# Patient Record
Sex: Male | Born: 1950 | Race: Black or African American | Hispanic: No | Smoking: Former smoker
Health system: Southern US, Community
[De-identification: ages and names within clinical notes are randomized; demographics above are authoritative.]

## PROBLEM LIST (undated history)

## (undated) DIAGNOSIS — K219 Gastro-esophageal reflux disease without esophagitis: Secondary | ICD-10-CM

## (undated) DIAGNOSIS — F419 Anxiety disorder, unspecified: Secondary | ICD-10-CM

## (undated) DIAGNOSIS — M1990 Unspecified osteoarthritis, unspecified site: Secondary | ICD-10-CM

## (undated) DIAGNOSIS — E119 Type 2 diabetes mellitus without complications: Secondary | ICD-10-CM

## (undated) DIAGNOSIS — I10 Essential (primary) hypertension: Secondary | ICD-10-CM

## (undated) DIAGNOSIS — G4730 Sleep apnea, unspecified: Secondary | ICD-10-CM

## (undated) DIAGNOSIS — E7800 Pure hypercholesterolemia, unspecified: Secondary | ICD-10-CM

## (undated) HISTORY — PX: EYE SURGERY: SHX253

## (undated) HISTORY — PX: PILONIDAL CYST / SINUS EXCISION: SUR543

## (undated) HISTORY — PX: OTHER SURGICAL HISTORY: SHX169

## (undated) HISTORY — PX: ROTATOR CUFF REPAIR: SHX139

---

## 2003-04-17 ENCOUNTER — Emergency Department (HOSPITAL_COMMUNITY): Admission: EM | Admit: 2003-04-17 | Discharge: 2003-04-17 | Payer: Self-pay | Admitting: Emergency Medicine

## 2003-04-20 ENCOUNTER — Emergency Department (HOSPITAL_COMMUNITY): Admission: EM | Admit: 2003-04-20 | Discharge: 2003-04-21 | Payer: Self-pay | Admitting: Emergency Medicine

## 2004-12-04 ENCOUNTER — Inpatient Hospital Stay (HOSPITAL_COMMUNITY): Admission: EM | Admit: 2004-12-04 | Discharge: 2004-12-07 | Payer: Self-pay | Admitting: Emergency Medicine

## 2006-05-28 ENCOUNTER — Ambulatory Visit: Payer: Self-pay | Admitting: Family Medicine

## 2006-06-04 ENCOUNTER — Ambulatory Visit: Payer: Self-pay | Admitting: Family Medicine

## 2006-06-18 ENCOUNTER — Ambulatory Visit: Payer: Self-pay | Admitting: Family Medicine

## 2006-07-18 ENCOUNTER — Ambulatory Visit: Payer: Self-pay | Admitting: Family Medicine

## 2006-08-22 ENCOUNTER — Ambulatory Visit: Payer: Self-pay | Admitting: Family Medicine

## 2006-09-19 ENCOUNTER — Ambulatory Visit: Payer: Self-pay | Admitting: Family Medicine

## 2006-09-26 ENCOUNTER — Ambulatory Visit: Payer: Self-pay | Admitting: Family Medicine

## 2006-10-03 ENCOUNTER — Ambulatory Visit: Payer: Self-pay | Admitting: Family Medicine

## 2006-10-24 ENCOUNTER — Ambulatory Visit: Payer: Self-pay | Admitting: Family Medicine

## 2006-11-24 ENCOUNTER — Ambulatory Visit: Payer: Self-pay | Admitting: Family Medicine

## 2006-12-24 ENCOUNTER — Ambulatory Visit: Payer: Self-pay | Admitting: Family Medicine

## 2007-01-26 ENCOUNTER — Ambulatory Visit: Payer: Self-pay | Admitting: Family Medicine

## 2007-08-11 ENCOUNTER — Ambulatory Visit (HOSPITAL_COMMUNITY): Admission: RE | Admit: 2007-08-11 | Discharge: 2007-08-11 | Payer: Self-pay | Admitting: Family Medicine

## 2008-05-03 ENCOUNTER — Emergency Department (HOSPITAL_COMMUNITY): Admission: EM | Admit: 2008-05-03 | Discharge: 2008-05-03 | Payer: Self-pay | Admitting: Emergency Medicine

## 2008-10-02 ENCOUNTER — Emergency Department (HOSPITAL_COMMUNITY): Admission: EM | Admit: 2008-10-02 | Discharge: 2008-10-02 | Payer: Self-pay | Admitting: Emergency Medicine

## 2010-01-01 ENCOUNTER — Emergency Department (HOSPITAL_COMMUNITY): Admission: EM | Admit: 2010-01-01 | Discharge: 2010-01-01 | Payer: Self-pay | Admitting: Emergency Medicine

## 2010-10-22 LAB — URINALYSIS, ROUTINE W REFLEX MICROSCOPIC
Specific Gravity, Urine: 1.03 (ref 1.005–1.030)
pH: 5.5 (ref 5.0–8.0)

## 2010-10-22 LAB — RAPID URINE DRUG SCREEN, HOSP PERFORMED
Amphetamines: NOT DETECTED
Barbiturates: NOT DETECTED
Benzodiazepines: NOT DETECTED
Cocaine: POSITIVE — AB
Opiates: NOT DETECTED
Tetrahydrocannabinol: NOT DETECTED

## 2010-10-22 LAB — GLUCOSE, CAPILLARY: Glucose-Capillary: 263 mg/dL — ABNORMAL HIGH (ref 70–99)

## 2010-11-05 DIAGNOSIS — Z01810 Encounter for preprocedural cardiovascular examination: Secondary | ICD-10-CM

## 2010-11-05 DIAGNOSIS — I2510 Atherosclerotic heart disease of native coronary artery without angina pectoris: Secondary | ICD-10-CM

## 2010-11-05 DIAGNOSIS — I4949 Other premature depolarization: Secondary | ICD-10-CM

## 2010-11-20 LAB — DIFFERENTIAL
Basophils Relative: 1 % (ref 0–1)
Eosinophils Relative: 1 % (ref 0–5)
Lymphs Abs: 1.3 10*3/uL (ref 0.7–4.0)
Monocytes Relative: 8 % (ref 3–12)
Neutrophils Relative %: 61 % (ref 43–77)

## 2010-11-20 LAB — CBC
HCT: 44.2 % (ref 39.0–52.0)
MCV: 86.1 fL (ref 78.0–100.0)
RBC: 5.13 MIL/uL (ref 4.22–5.81)
WBC: 4.4 10*3/uL (ref 4.0–10.5)

## 2010-11-20 LAB — BASIC METABOLIC PANEL
GFR calc Af Amer: >60 mL/min (ref >60)
GFR calc non Af Amer: >60 mL/min (ref >60)
Glucose, Bld: 267 mg/dL — ABNORMAL HIGH (ref 70–99)
Sodium: 132 mEq/L — ABNORMAL LOW (ref 135–145)

## 2010-12-21 NOTE — H&P (Signed)
[name], [name]                  ACCOUNT NO.:  [id number]   MEDICAL RECORD NO.:  [id number]          PATIENT TYPE:  INP   LOCATION:  A325                          FACILITY:  [facility]   PHYSICIAN:  Osvaldo Shipper, MD     DATE OF BIRTH:  Jun 14, 1951   DATE OF ADMISSION:  12/04/2004  DATE OF DISCHARGE:  LH                                HISTORY & PHYSICAL   The patient does not have a primary medical doctor.   ADMITTING DIAGNOSES:  1.  Gluteal abscess.  2.  Uncontrolled diabetes.  3.  Hypertension.   CHIEF COMPLAINT:  Pain in the left buttock for about 10-12 days.   HISTORY OF PRESENT ILLNESS:  The patient is a 60 year old African American  male with a past medical history of diabetes, hypertension, but who has not  been taking his medications for more than six months and who has not been  seen by a physician for more than a year and a half, presented to the ED  with complaints of pain in his left buttock region as well as drainage.  The  patient mentioned that he was fine till about 10-12 days ago when he started  noticing a boil in his left buttock more towards the midline.  The patient  also experienced sharp pain with this boil.  He tried to take Tylenol with  no relief.  The pain slowly increased and the boil also increased in size  progressively and this past Friday finally the boil burst and started  draining yellowish material with blood.  The patient said that the bloody  drainage pretty much remained continuous and would not stop in spite of him  trying to apply pressure to the wound with the help of paper towels.  The  patient also tried soaking the area in water with Epsom salt but this also  did not help.  The pain also got worse and became about a 9 out of 10 in  intensity and was described as a sharp kind of pain.  The pain did not  radiate anywhere.  The Tylenol that he was taking was not alleviating his  pain in any way, any movement and any pressure on that site  increased the  pain.   The patient otherwise denied any problems with moving his bowels.  No  history of any blood in the stool.  No problems like dysuria or hematuria.  The patient does not give any history of fever at home or any chills at  home.   MEDICATIONS:  The patient has not seen a physician in more than a year and a  half to two years.  He says he was on medications for diabetes and  hypertension but has not been taking them for about six months as he has not  been able to afford it.  The patient was on the following, he is unable to  give me any dosages:  Glucovance,  Lotrel,  alprazolam,  Zoloft,  baby aspirin.   ALLERGIES:  The patient is not allergic to any medication.  PAST MEDICAL HISTORY:  1.  The patient has had diabetes for about six years.  2.  High blood pressure for about 25 years.  3.  He also has a history of depression and anxiety.  The patient does not have a history of coronary artery disease, any lung  disease, or any previous strokes.   PAST SURGICAL HISTORY:  1.  Some surgical procedure to his neck and his head, done at [facility].  He is unable to give me any further details.  2.  He has had two surgeries to his right shoulder for rotator cuff injury.   SOCIAL HISTORY:  The patient lives alone at [place].  He is on disability.  He does not work at this time.  He smokes about half pack per day and has  been smoking for 34 years.  He has occasional beer and wine but not on a  daily basis.  His last consumption was about five days ago.  The last time  he used cocaine was about a year ago, nothing since then.  He denies any IV  drug use.  He walks about 2 miles almost daily and has very good functional  capacity.   FAMILY HISTORY:  Father had diabetes.  Mother also had diabetes.  Otherwise  no other medical problems that he knows of.  No history of any heart disease  or any cancers in the family.   REVIEW OF SYSTEMS:  A  ten-point review of systems was done which was  positive just for occasional constipation, otherwise negative as mentioned  under the HPI.   PHYSICAL EXAMINATION:  VITAL SIGNS:  Temperature 98.7, blood pressure  156/83, heart rate 85, respiratory rate 16, pulse oximetry 98% on room air.  GENERAL:  This is an obese, pleasant, African American male in no apparent  distress.  HEENT:  No pallor, no icterus.  Oral mucous membranes are moist and no oral  lesions are seen.  NECK:  Soft and supple.  LUNGS:  Clear to auscultation bilaterally with no wheezing or rhonchi and  with good air entry.  CARDIOVASCULAR:  S1 S2 is normal regular.  No S3 S4 is heard.  No murmurs  are heard.  No JVD seen.  ABDOMEN:  Soft, nontender, nondistended.  Abdomen is obese.  Bowel sounds  are present and appear to be normal.  No organomegaly or masses appreciated.  EXTREMITIES:  Without edema and peripheral pulses are palpable.  Left  buttock area:  There is an approximately 5-cm x 5-cm indurated area seen in  the left buttock close to the rectum.  There is an opening in the center of  this indurated area which is not actively draining at this time, however, a  bloody secretion with yellowish discharge is seen.  No real fluctuation is  appreciated.  The lesion is slightly firm to palpation and is moderately  tender.  NEUROLOGIC:  Grossly intact.   LABORATORY DATA:  His white count was 5.1 with a normal differential,  hemoglobin 14.3, platelet count 302.  Sodium 136, potassium 3.6, chloride 103, bicarb 26, glucose elevated at 313,  BUN 7, creatinine 0.8.  Total bilirubin is 0.5, alkaline phosphatase 89.  His AST is mildly elevated  at 43 so is the ALT at 44.  Albumin is 3.0.  Calcium is 9.3.  Lipase is 19.   EKG shows sinus rhythm, normal rate and axis. No q waves or ST-T wave  changes noted.  Some degree of LVH is noted.   No imaging studies have been done.   IMPRESSION: 1.  This is a 60 year old African  American male with a past medical history      of diabetes and hypertension, however, has not been compliant with his      medications because of financial problems for more than six months, and      he presents to the emergency department with a gluteal/perirectal      abscess.  No obvious evidence of any kind of injury to this region which      could have precipitated this problem, however, the patient is a diabetic      and is poorly controlled which could have predisposed him to getting      this infection.  2.  The patient's blood sugar is elevated and considering the fact that he      has not been taking his medications, we will assume that his diabetes is      very poorly controlled.  3.  The patient's blood pressure is also slightly elevated at this time.  4.  Patient also has mildly elevated LFT's. Could be secondary to NASH.   PLAN:  1.  Uncontrolled diabetes.  We will give the patient IV fluids.  Insulin has      been given by the ED physician.  His last finger stick was 257.  I will      start the patient on a diabetic diet, put him on finger sticks.  I will      also start the patient on Metformin and glyburide.  The patient's blood      sugars should improve with this regimen.  I will also check an HBA1c to      document poor diabetic control.  2.  Hypertension.  I will start the patient on Lisinopril which will also be      renal protective in a diabetic.  3.  Gluteal/perirectal abscess.  Dr. Malvin Johns has already seen this patient      in the ER.  The patient has been started on levofloxacin and Flagyl.      Plans for surgical intervention as per Dr. Malvin Johns.  Pain control will      be achieved with Dilaudid and Tylenol as needed.  4.  Anxiety and depression.  I will start the patient on alprazolam p.r.n.      as well as on Zoloft as he was taking as an outpatient.  5.  Mildly Elevated LFT: Will recheck LFT in AM and will check hepatitis      profile.   Further  management decisions will be based on the patient's response to  initial treatment and results of further testing.      GK/MEDQ  D:  12/04/2004  T:  12/04/2004  Job:  [id number]   cc:   Barbaraann Barthel, M.D.  [address]  [place]  [place] [address]  Fax: [phone]

## 2010-12-21 NOTE — Consult Note (Signed)
NAMEARSH, FEUTZ NO.:  [id number]   MEDICAL RECORD NO.:  [id number]          PATIENT TYPE:  EMS   LOCATION:  ED                            FACILITY:  [facility]   PHYSICIAN:  Barbaraann Barthel, M.D. DATE OF BIRTH:  [birth year]   DATE OF CONSULTATION:  DATE OF DISCHARGE:                                   CONSULTATION   DATE OF CONSULTATION:  Dec 04, 2004.   Surgery was asked to see this 60 year old, black male diabetic hypertensive  who came into the emergency room with a draining gluteal abscess.  Surgery  responded immediately.   HISTORY OF PRESENT MEDICAL ILLNESS:  The patient states that he had had  drainage from his gluteal area for the last several days with a 2-week  history of discomfort in this area.  He is a known diabetic and a known  hypertensive.  However, he has stopped seeing his physicians, claiming that  he cannot afford the medications, and he essentially has not seen a  physician in over 6 months.  He was previously treated for diabetes and for  his hypertension.  He thinks he received Lotensin in the past, details of  which are not known.   PHYSICAL EXAMINATION:  GENERAL APPEARANCE:  Disclosed a pleasant, 54-year-  old black male, uncomfortable but in no acute distress.  VITAL SIGNS:  His temperature is 98.7, blood pressure 156/83, pulse rate 85,  respirations 24, O2 saturation 98%.  He is 6 feet tall and weighs 253  pounds, according to him.  HEAD:  Normocephalic.  EYES:  Extraocular movements are intact.  Pupils are round and reactive to  light and accommodation.  There is noted no conjunctival pallor or scleral  injection.  NOSE/MOUTH:  Nose and oral mucosa are moist.  The patient is edentulous.  NECK:  No bruits are auscultated.  No jugular venous distention,  thyromegaly, tracheal deviation or cervical adenopathy.  He has had previous  I&D procedures of abscesses on his posterior neck in the past.  CHEST:  Lungs are clear both anteriorly  and posteriorly to auscultation.  HEART:  Regular rhythm.  LUNGS:  Clear.  ABDOMEN:  Soft.  GENITOURINARY:  Genitalia are within normal limits.  SKIN:  The patient has an abscess in his right gluteal area that is open.  There is some induration around this but no clinically obvious loculations,  no crepitus, no necrotizing fasciitis type of appearance to me.  EXTREMITIES:  Within normal limits.   REVIEW OF SYSTEMS:  ENDOCRINE:  A history of diabetes.  Medications taken  are unknown for this.  No history of thyroid disease. GI:  No history of  bright red rectal bleeding, black, tarry stools, inflammatory bowel disease  or unexplained weight loss or history of hepatitis.  The patient stated that  he had a colonoscopy 20 years ago.  The details of this are not known.  GU:  A history of nephrolithiasis in the past.  No dysuria at present.  NEUROLOGIC:  Neurologically intact.  No history of seizures or migraines.  CARDIORESPIRATORY SYSTEM:  The patient has had a history of hypertension in  the past.  He has taken Apresoline and Lotrel.  He has also taken Glucovance  in the past, but he has not done this in at least the last 6 months.   No known drug allergies.  The patient has smoked in the past.  He also has  used alcohol.  He states that he does not abuse alcohol at present.   LABORATORY DATA:  Pending.  Blood sugars performed by [facility] in the  emergency room were over 360.   IMPRESSION:  1.  Abscess in the gluteal area.  2.  Hypertension.  3.  Diabetes.   PLAN:  He will be admitted.  In essence, this abscess is open with some  induration.  There is no loculation.  This can be taken to be further  drained in a nonemergent manner to the emergency room.  He needs to have his  blood sugar and his hypertension controlled.  I have prescribed Levaquin to  be continued as per the ER doctor.  That is a good choice.  I have added  Flagyl 500 mg IV q.8h., and sliding scale will be  instigated, and he will be  seen by the hospitalist service.      WB/MEDQ  D:  12/04/2004  T:  12/04/2004  Job:  [id number]   cc:   Barbaraann Barthel, M.D.  [address]  [place]  [place] [address]  Fax: [phone]   Rhae Lerner. Margretta Ditty, M.D.  [address]  [place]  [place] [address]

## 2010-12-21 NOTE — Op Note (Signed)
[name], [name]                  ACCOUNT NO.:  [id number]   MEDICAL RECORD NO.:  [id number]          PATIENT TYPE:  INP   LOCATION:  A325                          FACILITY:  [facility]   PHYSICIAN:  Barbaraann Barthel, M.D. DATE OF BIRTH:  [birth year]   DATE OF PROCEDURE:  12/06/2004  DATE OF DISCHARGE:                                 OPERATIVE REPORT   .   PREOPERATIVE DIAGNOSIS:  Left gluteal abscess.   POSTOPERATIVE DIAGNOSIS:  Left gluteal abscess.   PROCEDURE:  Incision and drainage with debridement and packing of left  gluteal abscess.   NOTE:  This is a 60 year old black male diabetic who came into the emergency  room with draining abscess on his gluteus. His blood sugars were out of  control at that time. He was treated by the hospitalist service, and we  planned for debridement in the operating room when this was attended to.   GROSS OPERATIVE FINDINGS:  Consistent with an abscess. This was pretty much  draining without any real loculations. Necrotic tissue was debrided.  Cultures were obtained. Previous cultures had been obtained in  the  emergency room.   TECHNIQUE:  The patient was placed in the lithotomy position after the  adequate administration of spinal anesthesia. His gluteus area was prepped  with Betadine solution and draped in the usual manner, and incision was  carried out over the opened the area, and the necrotic tissue was debrided  and sent as a specimen. Wound was irrigated copiously. Bleeding was  controlled with a cautery device. The wound was packed with saline-soaked  Kerlix Kling. Sterile dressing was applied. Prior to closure, all sponge,  needle and instrument counts were found to be correct. Estimated blood loss  was minimal. There were no complications.   This patient will be returned to the medical service where he will be  followed further, and we will coordinate dressing care with the physical  therapy department, that can be handled as soon as  this patient can be  discharged.      WB/MEDQ  D:  12/06/2004  T:  12/06/2004  Job:  [id number]

## 2010-12-21 NOTE — Discharge Summary (Signed)
[name], [name]                  ACCOUNT NO.:  [id number]   MEDICAL RECORD NO.:  [id number]          PATIENT TYPE:  INP   LOCATION:  A325                          FACILITY:  [facility]   PHYSICIAN:  Osvaldo Shipper, MD     DATE OF BIRTH:  07-Aug-1950   DATE OF ADMISSION:  12/04/2004  DATE OF DISCHARGE:  05/05/2006LH                                 DISCHARGE SUMMARY   DISCHARGE DIAGNOSES:  1.  Gluteal abscess.  2.  Uncontrolled diabetes.  3.  Hypertension, uncontrolled.   Please review H&P dictated May 2nd for details regarding the patient's  presenting illness.   BRIEF HOSPITAL COURSE:  1.  Gluteal abscess.  The patient is a 60 year old African-American male who      presented to the emergency department with a draining left gluteal      abscess.  The patient was put on levofloxacin and Flagyl and was seen by      Dr. Malvin Johns.  Management of the gluteal abscess was as per Dr.      Daisy Blossom input.  The patient underwent a surgical I&D under spinal      anesthesia on May 4th with no complications.  No loculations or pus      collections were seen on the I&D. The patient was seen stable postop.      On the day of discharge, his pain is well controlled, and he needs to      get dressing changes twice a day.  The patient will need to be on      antibiotics for a total of 7 days.  Cultures from the wound at this time      are growing moderate group B strep.  The patient at no point was febrile      and did not have an elevated white count during this admission.  2.  Uncontrolled diabetes.  The patient has a history of diabetes.  However,      he had no been on any medication for 6 months prior to this admission      because of financial problems.  The patient had not seen a doctor in      about 2 years.  The patient was started on metformin and glyburide, and      he achieved optimal control of his blood sugars with this regimen.  A      hemoglobin A1c was checked which was 13.  3.   Hypertension.  The patient was running high blood pressures during the      course of his admission.  I had been titrating his medications at this      time.  His blood pressures have improved although not completely      optimal.  The patient does not have any chest pain, shortness of breath      or headaches.  The patient will need to be seen by home health nurse and      get his blood pressure checked at home to make sure that they are      getting controlled  appropriately.  4.  Elevated LFTs.  The patient was found to have mild elevation in his      liver function tests.  He underwent a hepatitis profile check which was      negative.  He also underwent an ultrasound of his abdomen which showed      fatty liver.  The patient was counseled not to consume any alcohol.  He      was asked to follow up with a primary doctor soon.   DISCHARGE MEDICATIONS:  1.  Glucophage 1000 mg b.i.d.  2.  Glyburide 5 mg p.o. b.i.d.  3.  Lisinopril 30 mg p.o. daily.  4.  Hydrochlorothiazide 25 mg p.o. daily.  5.  Metoprolol 100 mg p.o. b.i.d.  6.  Vicodin 7.5/500, 1 p.o. q.4h. p.r.n. for pain.  Sixty tablets were      prescribed.  7.  Ciprofloxacin 250 mg p.o. b.i.d. for 5 days.  8.  Flagyl 500 mg every 8 hours for 5 days.  9.  The patient is not to have any aspirin as per Dr. Malvin Johns.   DIET:  The patient should eat an 1800-calorie ADA diet.   PHYSICAL ACTIVITY:  The patient is ambulating without any difficulties in  his room.  He may continue to do so at home.   WOUND CARE:  The patient needs to get dressing changes to his left gluteal  wound twice a day until he is seen by Dr. Malvin Johns or the wound heals.  Dressing changes should be with normal-saline-soaked gauze to be packed and  covered with a dry 4-x-4 and ABD  pad.  Any further instructions or  questions should be directed to Dr. Malvin Johns.   PROCEDURE PERFORMED DURING THIS HOSPITAL STAY:  Included surgical I&D done  under spinal  anesthesia by Dr. Malvin Johns.   FOLLOW-UP CARE:  1.  The patient does not have a PMD.  Dr. Loleta Chance will be assigned to him, and      he should see him in the next 1 week.  An appointment will be made for      the patient.  2.  The patient needs to follow up with Dr. Malvin Johns in 3 weeks' time.  3.  A referral will be made to mental health for his depression.  The      patient is not able to afford his medication at this time, and they      might be able to describe him an antidepressant.  The patient does not      have any suicidal or homicidal ideations.      GK/MEDQ  D:  12/07/2004  T:  12/07/2004  Job:  [id number]   cc:   Barbaraann Barthel, M.D.  [address]  [place]  [place] [address]  Fax: [phone]   Annia Friendly. Loleta Chance, MD  [address]  [place]  [place] [address]  Fax: [phone]

## 2011-05-06 LAB — CBC
HCT: 42.2
MCV: 86.4
Platelets: 263
RDW: 14.1

## 2011-05-06 LAB — BASIC METABOLIC PANEL
BUN: 17
CO2: 23
GFR calc Af Amer: >60
GFR calc non Af Amer: >60
Glucose, Bld: 202 — ABNORMAL HIGH
Sodium: 133 — ABNORMAL LOW

## 2011-05-06 LAB — DIFFERENTIAL
Basophils Absolute: 0
Basophils Relative: 1
Lymphocytes Relative: 38
Monocytes Relative: 6

## 2011-05-06 LAB — ETHANOL: Alcohol, Ethyl (B): 189 — ABNORMAL HIGH

## 2011-05-06 LAB — RAPID URINE DRUG SCREEN, HOSP PERFORMED
Cocaine: POSITIVE — AB
Opiates: NOT DETECTED

## 2011-08-07 ENCOUNTER — Ambulatory Visit: Payer: Medicare Other | Attending: Sports Medicine | Admitting: Physical Therapy

## 2011-08-07 DIAGNOSIS — IMO0001 Reserved for inherently not codable concepts without codable children: Secondary | ICD-10-CM | POA: Insufficient documentation

## 2011-08-07 DIAGNOSIS — M25619 Stiffness of unspecified shoulder, not elsewhere classified: Secondary | ICD-10-CM | POA: Insufficient documentation

## 2011-08-07 DIAGNOSIS — R5381 Other malaise: Secondary | ICD-10-CM | POA: Insufficient documentation

## 2011-08-07 DIAGNOSIS — M25519 Pain in unspecified shoulder: Secondary | ICD-10-CM | POA: Insufficient documentation

## 2011-08-08 ENCOUNTER — Ambulatory Visit: Payer: Medicare Other | Admitting: Physical Therapy

## 2011-08-12 ENCOUNTER — Ambulatory Visit: Payer: Medicare Other | Admitting: Physical Therapy

## 2011-08-15 ENCOUNTER — Ambulatory Visit: Payer: Medicare Other | Admitting: Physical Therapy

## 2011-08-19 ENCOUNTER — Ambulatory Visit: Payer: Medicare Other | Admitting: Physical Therapy

## 2011-08-21 ENCOUNTER — Ambulatory Visit: Payer: Medicare Other | Admitting: Physical Therapy

## 2011-08-27 ENCOUNTER — Ambulatory Visit: Payer: Medicare Other | Admitting: Physical Therapy

## 2011-08-29 ENCOUNTER — Ambulatory Visit: Payer: Medicare Other | Admitting: Physical Therapy

## 2011-09-03 ENCOUNTER — Ambulatory Visit: Payer: Medicare Other | Admitting: Physical Therapy

## 2011-09-05 ENCOUNTER — Ambulatory Visit: Payer: Medicare Other | Admitting: Physical Therapy

## 2011-09-10 ENCOUNTER — Encounter: Payer: Medicare Other | Admitting: Physical Therapy

## 2011-09-11 ENCOUNTER — Ambulatory Visit: Payer: Medicare Other | Admitting: Physical Therapy

## 2011-09-12 ENCOUNTER — Ambulatory Visit: Payer: Medicare Other | Attending: Sports Medicine | Admitting: Physical Therapy

## 2011-09-12 DIAGNOSIS — M25519 Pain in unspecified shoulder: Secondary | ICD-10-CM | POA: Insufficient documentation

## 2011-09-12 DIAGNOSIS — M25619 Stiffness of unspecified shoulder, not elsewhere classified: Secondary | ICD-10-CM | POA: Insufficient documentation

## 2011-09-12 DIAGNOSIS — R5381 Other malaise: Secondary | ICD-10-CM | POA: Insufficient documentation

## 2011-09-12 DIAGNOSIS — IMO0001 Reserved for inherently not codable concepts without codable children: Secondary | ICD-10-CM | POA: Insufficient documentation

## 2011-09-17 ENCOUNTER — Ambulatory Visit: Payer: Medicare Other | Admitting: Physical Therapy

## 2011-09-19 ENCOUNTER — Ambulatory Visit: Payer: Medicare Other | Admitting: Physical Therapy

## 2011-09-24 ENCOUNTER — Ambulatory Visit: Payer: Medicare Other | Admitting: Physical Therapy

## 2011-09-26 ENCOUNTER — Ambulatory Visit: Payer: Medicare Other | Admitting: Physical Therapy

## 2011-10-01 ENCOUNTER — Encounter: Payer: Medicare Other | Admitting: Physical Therapy

## 2011-10-09 ENCOUNTER — Ambulatory Visit: Payer: Medicare Other | Attending: Sports Medicine | Admitting: Physical Therapy

## 2011-10-09 DIAGNOSIS — R5381 Other malaise: Secondary | ICD-10-CM | POA: Insufficient documentation

## 2011-10-09 DIAGNOSIS — M25619 Stiffness of unspecified shoulder, not elsewhere classified: Secondary | ICD-10-CM | POA: Insufficient documentation

## 2011-10-09 DIAGNOSIS — IMO0001 Reserved for inherently not codable concepts without codable children: Secondary | ICD-10-CM | POA: Insufficient documentation

## 2011-10-09 DIAGNOSIS — M25519 Pain in unspecified shoulder: Secondary | ICD-10-CM | POA: Insufficient documentation

## 2011-10-11 ENCOUNTER — Encounter: Payer: Medicare Other | Admitting: Physical Therapy

## 2011-10-16 ENCOUNTER — Encounter: Payer: Medicare Other | Admitting: Physical Therapy

## 2011-10-18 ENCOUNTER — Encounter: Payer: Medicare Other | Admitting: Physical Therapy

## 2013-03-30 NOTE — Patient Instructions (Addendum)
[name]  03/30/2013   Your procedure is scheduled on: Friday,August 29  Report to [facility] at [place] AM.  Call this number if you have problems the morning of surgery: [phone]   Remember:   Do not eat food or drink liquids after midnight.   Take these medicines the morning of surgery with A SIP OF WATER: lisinopril,xanax, norvasc, prilosec   Do not wear jewelry, make-up or nail polish.  Do not wear lotions, powders, or perfumes.  Do not shave 48 hours prior to surgery. Men may shave face and neck.  Do not bring valuables to the hospital.  [facility] is not responsible  for any belongings or valuables.  Contacts, dentures or bridgework may not be worn into surgery.  Leave suitcase in the car. After surgery it may be brought to your room.  For patients admitted to the hospital, checkout time is 11:00 AM the day of discharge.   Patients discharged the day of surgery will not be allowed to drive  home.  Name and phone number of your driver: Family or friend  Special Instructions: N/A   Please read over the following fact sheets that you were given: Pain Booklet, Coughing and Deep Breathing, MRSA Information, Surgical Site Infection Prevention and Care and Recovery After Surgery  PATIENT INSTRUCTIONS POST-ANESTHESIA  IMMEDIATELY FOLLOWING SURGERY:  Do not drive or operate machinery for the first twenty four hours after surgery.  Do not make any important decisions for twenty four hours after surgery or while taking narcotic pain medications or sedatives.  If you develop intractable nausea and vomiting or a severe headache please notify your doctor immediately.  FOLLOW-UP:  Please make an appointment with your surgeon as instructed. You do not need to follow up with anesthesia unless specifically instructed to do so.  WOUND CARE INSTRUCTIONS (if applicable):  Keep a dry clean dressing on the anesthesia/puncture wound site if there is drainage.  Once the wound has quit draining you  may leave it open to air.  Generally you should leave the bandage intact for twenty four hours unless there is drainage.  If the epidural site drains for more than 36-48 hours please call the anesthesia department.  QUESTIONS?:  Please feel free to call your physician or the hospital operator if you have any questions, and they will be happy to assist you.     Cataract Surgery  A cataract is a clouding of the lens of the eye. When a lens becomes cloudy, vision is reduced based on the degree and nature of the clouding. Surgery may be needed to improve vision. Surgery removes the cloudy lens and usually replaces it with a substitute lens (intraocular lens, IOL). LET YOUR EYE DOCTOR KNOW ABOUT:  Allergies to food or medicine.  Medicines taken including herbs, eyedrops, over-the-counter medicines, and creams.  Use of steroids (by mouth or creams).  Previous problems with anesthetics or numbing medicine.  History of bleeding problems or blood clots.  Previous surgery.  Other health problems, including diabetes and kidney problems.  Possibility of pregnancy, if this applies. RISKS AND COMPLICATIONS  Infection.  Inflammation of the eyeball (endophthalmitis) that can spread to both eyes (sympathetic ophthalmia).  Poor wound healing.  If an IOL is inserted, it can later fall out of proper position. This is very uncommon.  Clouding of the part of your eye that holds an IOL in place. This is called an "after-cataract." These are uncommon, but easily treated. BEFORE THE PROCEDURE  Do  not eat or drink anything except small amounts of water for 8 to 12 before your surgery, or as directed by your caregiver.  Unless you are told otherwise, continue any eyedrops you have been prescribed.  Talk to your primary caregiver about all other medicines that you take (both prescription and non-prescription). In some cases, you may need to stop or change medicines near the time of your surgery. This is  most important if you are taking blood-thinning medicine.Do not stop medicines unless you are told to do so.  Arrange for someone to drive you to and from the procedure.  Do not put contact lenses in either eye on the day of your surgery. PROCEDURE There is more than one method for safely removing a cataract. Your doctor can explain the differences and help determine which is best for you. Phacoemulsification surgery is the most common form of cataract surgery.  An injection is given behind the eye or eyedrops are given to make this a painless procedure.  A small cut (incision) is made on the edge of the clear, dome-shaped surface that covers the front of the eye (cornea).  A tiny probe is painlessly inserted into the eye. This device gives off ultrasound waves that soften and break up the cloudy center of the lens. This makes it easier for the cloudy lens to be removed by suction.  An IOL may be implanted.  The normal lens of the eye is covered by a clear capsule. Part of that capsule is intentionally left in the eye to support the IOL.  Your surgeon may or may not use stitches to close the incision. There are other forms of cataract surgery that require a larger incision and stiches to close the eye. This approach is taken in cases where the doctor feels that the cataract cannot be easily removed using phacoemulsification. AFTER THE PROCEDURE  When an IOL is implanted, it does not need care. It becomes a permanent part of your eye and cannot be seen or felt.  Your doctor will schedule follow-up exams to check on your progress.  Review your other medicines with your doctor to see which can be resumed after surgery.  Use eyedrops or take medicine as prescribed by your doctor. Document Released: 07/11/2011 Document Revised: 10/14/2011 Document Reviewed: 07/11/2011 [facility] Patient Information 2014 [place], [place].

## 2013-03-31 ENCOUNTER — Encounter (HOSPITAL_COMMUNITY): Payer: Self-pay

## 2013-03-31 ENCOUNTER — Other Ambulatory Visit: Payer: Self-pay

## 2013-03-31 ENCOUNTER — Encounter (HOSPITAL_COMMUNITY)
Admission: RE | Admit: 2013-03-31 | Discharge: 2013-03-31 | Disposition: A | Discharge status: Home / Self Care | Payer: Medicare Other | Source: Ambulatory Visit | Attending: Ophthalmology | Admitting: Ophthalmology

## 2013-03-31 ENCOUNTER — Encounter (HOSPITAL_COMMUNITY): Payer: Self-pay | Admitting: Pharmacy Technician

## 2013-03-31 HISTORY — DX: Anxiety disorder, unspecified: F41.9

## 2013-03-31 HISTORY — DX: Type 2 diabetes mellitus without complications: E11.9

## 2013-03-31 HISTORY — DX: Pure hypercholesterolemia, unspecified: E78.00

## 2013-03-31 HISTORY — DX: Gastro-esophageal reflux disease without esophagitis: K21.9

## 2013-03-31 HISTORY — DX: Unspecified osteoarthritis, unspecified site: M19.90

## 2013-03-31 HISTORY — DX: Essential (primary) hypertension: I10

## 2013-03-31 LAB — HEMOGLOBIN AND HEMATOCRIT, BLOOD
HCT: 36.7 % — ABNORMAL LOW (ref 39.0–52.0)
Hemoglobin: 11.5 g/dL — ABNORMAL LOW (ref 13.0–17.0)

## 2013-03-31 LAB — BASIC METABOLIC PANEL: CO2: 25 mEq/L (ref 19–32)

## 2013-03-31 MED ORDER — CYCLOPENTOLATE-PHENYLEPHRINE 0.2-1 % OP SOLN
OPHTHALMIC | Status: AC
  Filled 2013-03-31: qty 2

## 2013-03-31 NOTE — Progress Notes (Signed)
03/31/13 1321  OBSTRUCTIVE SLEEP APNEA  Have you ever been diagnosed with sleep apnea through a sleep study? No  Do you snore loudly (loud enough to be heard through closed doors)?  0  Do you often feel tired, fatigued, or sleepy during the daytime? 0  Has anyone observed you stop breathing during your sleep? 0  Do you have, or are you being treated for high blood pressure? 1  BMI more than 35 kg/m2? 1  Age over 62 years old? 1  Neck circumference greater than 40 cm/18 inches? 1  Gender: 1  Obstructive Sleep Apnea Score 5  Score 4 or greater  Results sent to PCP

## 2013-04-02 ENCOUNTER — Encounter (HOSPITAL_COMMUNITY)
Admission: RE | Disposition: A | Discharge status: Home / Self Care | Payer: Self-pay | Source: Ambulatory Visit | Attending: Ophthalmology

## 2013-04-02 ENCOUNTER — Ambulatory Visit (HOSPITAL_COMMUNITY)
Admission: RE | Admit: 2013-04-02 | Discharge: 2013-04-02 | Disposition: A | Discharge status: Home / Self Care | Payer: Medicare Other | Source: Ambulatory Visit | Attending: Ophthalmology | Admitting: Ophthalmology

## 2013-04-02 ENCOUNTER — Ambulatory Visit (HOSPITAL_COMMUNITY): Payer: Medicare Other | Admitting: Anesthesiology

## 2013-04-02 ENCOUNTER — Encounter (HOSPITAL_COMMUNITY): Payer: Self-pay | Admitting: *Deleted

## 2013-04-02 ENCOUNTER — Encounter (HOSPITAL_COMMUNITY): Payer: Self-pay | Admitting: Anesthesiology

## 2013-04-02 DIAGNOSIS — E119 Type 2 diabetes mellitus without complications: Secondary | ICD-10-CM | POA: Insufficient documentation

## 2013-04-02 DIAGNOSIS — Z01810 Encounter for preprocedural cardiovascular examination: Secondary | ICD-10-CM | POA: Insufficient documentation

## 2013-04-02 DIAGNOSIS — I10 Essential (primary) hypertension: Secondary | ICD-10-CM | POA: Insufficient documentation

## 2013-04-02 DIAGNOSIS — H2510 Age-related nuclear cataract, unspecified eye: Secondary | ICD-10-CM | POA: Insufficient documentation

## 2013-04-02 DIAGNOSIS — Z01812 Encounter for preprocedural laboratory examination: Secondary | ICD-10-CM | POA: Insufficient documentation

## 2013-04-02 HISTORY — PX: CATARACT EXTRACTION W/PHACO: SHX586

## 2013-04-02 SURGERY — PHACOEMULSIFICATION, CATARACT, WITH IOL INSERTION
Anesthesia: Monitor Anesthesia Care | Site: Eye | Laterality: Right | Wound class: Clean

## 2013-04-02 MED ORDER — FENTANYL CITRATE 0.05 MG/ML IJ SOLN
INTRAMUSCULAR | Status: AC
  Filled 2013-04-02: qty 2

## 2013-04-02 MED ORDER — LACTATED RINGERS IV SOLN
INTRAVENOUS | Status: DC
  Administered 2013-04-02: 1000 mL via INTRAVENOUS

## 2013-04-02 MED ORDER — TETRACAINE HCL 0.5 % OP SOLN: 1.0000 [drp] | Freq: Once | OPHTHALMIC | Status: DC

## 2013-04-02 MED ORDER — LIDOCAINE 3.5 % OP GEL OPTIME - NO CHARGE
OPHTHALMIC | Status: DC | PRN
  Administered 2013-04-02: 2 [drp] via OPHTHALMIC

## 2013-04-02 MED ORDER — TETRACAINE 0.5 % OP SOLN OPTIME - NO CHARGE
OPHTHALMIC | Status: DC | PRN
  Administered 2013-04-02: 2 [drp] via OPHTHALMIC

## 2013-04-02 MED ORDER — EPINEPHRINE HCL 1 MG/ML IJ SOLN
INTRAMUSCULAR | Status: AC
  Filled 2013-04-02: qty 1

## 2013-04-02 MED ORDER — FENTANYL CITRATE 0.05 MG/ML IJ SOLN
INTRAMUSCULAR | Status: DC | PRN
  Administered 2013-04-02 (×2): 25 ug via INTRAVENOUS

## 2013-04-02 MED ORDER — CYCLOPENTOLATE-PHENYLEPHRINE 0.2-1 % OP SOLN
1.0000 [drp] | OPHTHALMIC | Status: AC
  Administered 2013-04-02 (×3): 1 [drp] via OPHTHALMIC

## 2013-04-02 MED ORDER — POVIDONE-IODINE 5 % OP SOLN
OPHTHALMIC | Status: DC | PRN
  Administered 2013-04-02: 1 via OPHTHALMIC

## 2013-04-02 MED ORDER — EPINEPHRINE HCL 1 MG/ML IJ SOLN
INTRAOCULAR | Status: DC | PRN
  Administered 2013-04-02: 08:00:00

## 2013-04-02 MED ORDER — MIDAZOLAM HCL 2 MG/2ML IJ SOLN
1.0000 mg | INTRAMUSCULAR | Status: DC | PRN
  Administered 2013-04-02 (×2): 2 mg via INTRAVENOUS

## 2013-04-02 MED ORDER — NA HYALUR & NA CHOND-NA HYALUR 0.55-0.5 ML IO KIT
PACK | INTRAOCULAR | Status: DC | PRN
  Administered 2013-04-02: 1 via OPHTHALMIC

## 2013-04-02 MED ORDER — LIDOCAINE HCL 3.5 % OP GEL
OPHTHALMIC | Status: AC
  Filled 2013-04-02: qty 5

## 2013-04-02 MED ORDER — GATIFLOXACIN 0.5 % OP SOLN OPTIME - NO CHARGE
OPHTHALMIC | Status: DC | PRN
  Administered 2013-04-02: 1 [drp] via OPHTHALMIC

## 2013-04-02 MED ORDER — BSS IO SOLN
INTRAOCULAR | Status: DC | PRN
  Administered 2013-04-02: 15 mL via INTRAOCULAR

## 2013-04-02 MED ORDER — TETRACAINE HCL 0.5 % OP SOLN: 1.0000 [drp] | OPHTHALMIC | Status: DC

## 2013-04-02 MED ORDER — MIDAZOLAM HCL 2 MG/2ML IJ SOLN
INTRAMUSCULAR | Status: AC
  Filled 2013-04-02: qty 2

## 2013-04-02 MED ORDER — FENTANYL CITRATE 0.05 MG/ML IJ SOLN
25.0000 ug | INTRAMUSCULAR | Status: AC
  Administered 2013-04-02 (×2): 25 ug via INTRAVENOUS

## 2013-04-02 MED ORDER — TETRACAINE HCL 0.5 % OP SOLN
OPHTHALMIC | Status: AC
  Filled 2013-04-02: qty 2

## 2013-04-02 SURGICAL SUPPLY — 27 items
CAPSULAR TENSION RING-AMO (OPHTHALMIC RELATED) IMPLANT
CLOTH BEACON ORANGE TIMEOUT ST (SAFETY) ×2 IMPLANT
GLOVE BIO SURGEON STRL SZ7.5 (GLOVE) IMPLANT
GLOVE BIOGEL M 6.5 STRL (GLOVE) IMPLANT
GLOVE BIOGEL PI IND STRL 6.5 (GLOVE) IMPLANT
GLOVE BIOGEL PI IND STRL 7.0 (GLOVE) IMPLANT
GLOVE BIOGEL PI INDICATOR 6.5 (GLOVE)
GLOVE BIOGEL PI INDICATOR 7.0 (GLOVE)
GLOVE ECLIPSE 6.5 STRL STRAW (GLOVE) IMPLANT
GLOVE ECLIPSE 7.5 STRL STRAW (GLOVE) IMPLANT
GLOVE EXAM NITRILE LRG STRL (GLOVE) IMPLANT
GLOVE EXAM NITRILE MD LF STRL (GLOVE) IMPLANT
GLOVE SKINSENSE NS SZ6.5 (GLOVE)
GLOVE SKINSENSE NS SZ7.0 (GLOVE) ×1
GLOVE SKINSENSE STRL SZ6.5 (GLOVE) IMPLANT
GLOVE SKINSENSE STRL SZ7.0 (GLOVE) ×1 IMPLANT
INST SET CATARACT ~~LOC~~ (KITS) ×2 IMPLANT
KIT VITRECTOMY (OPHTHALMIC RELATED) IMPLANT
PAD ARMBOARD 7.5X6 YLW CONV (MISCELLANEOUS) ×2 IMPLANT
PROC W NO LENS (INTRAOCULAR LENS)
PROC W SPEC LENS (INTRAOCULAR LENS)
PROCESS W NO LENS (INTRAOCULAR LENS) IMPLANT
PROCESS W SPEC LENS (INTRAOCULAR LENS) IMPLANT
RING MALYGIN (MISCELLANEOUS) ×2 IMPLANT
SIGHTPATH CAT PROC W REG LENS (Ophthalmic Related) ×2 IMPLANT
VISCOELASTIC ADDITIONAL (OPHTHALMIC RELATED) IMPLANT
WATER STERILE IRR 250ML POUR (IV SOLUTION) ×2 IMPLANT

## 2013-04-02 NOTE — Brief Op Note (Signed)
04/02/2013  10:05 AM  PATIENT:  [name]  62 y.o. 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 Luciana Axe for PRP laser treatment  PATIENT DISPOSITION:  Short Stay

## 2013-04-02 NOTE — H&P (Signed)
I have reviewed the pre printed H&P, the patient was re-examined, and I have identified no significant interval changes in the patient's medical condition.  There is no change in the plan of care since the history and physical of record. 

## 2013-04-02 NOTE — Op Note (Signed)
See scanned note.

## 2013-04-02 NOTE — Anesthesia Preprocedure Evaluation (Signed)
Anesthesia Evaluation  Patient identified by MRN, date of birth, ID band Patient awake    Reviewed: Allergy & Precautions, H&P , NPO status , Patient's Chart, lab work & pertinent test results  Airway Mallampati: II TM Distance: >3 FB     Dental  (+) Edentulous Upper and Partial Lower   Pulmonary  breath sounds clear to auscultation        Cardiovascular hypertension, Pt. on medications Rhythm:Regular Rate:Normal     Neuro/Psych PSYCHIATRIC DISORDERS Anxiety    GI/Hepatic GERD-  Medicated and Controlled,  Endo/Other  diabetes, Well Controlled, Type 2, Oral Hypoglycemic Agents  Renal/GU      Musculoskeletal   Abdominal   Peds  Hematology   Anesthesia Other Findings   Reproductive/Obstetrics                           Anesthesia Physical Anesthesia Plan  ASA: III  Anesthesia Plan: MAC   Post-op Pain Management:    Induction: Intravenous  Airway Management Planned: Nasal Cannula  Additional Equipment:   Intra-op Plan:   Post-operative Plan:   Informed Consent: I have reviewed the patients History and Physical, chart, labs and discussed the procedure including the risks, benefits and alternatives for the proposed anesthesia with the patient or authorized representative who has indicated his/her understanding and acceptance.     Plan Discussed with:   Anesthesia Plan Comments:         Anesthesia Quick Evaluation

## 2013-04-02 NOTE — Transfer of Care (Signed)
Immediate Anesthesia Transfer of Care Note  Patient: [name]  Procedure(s) Performed: Procedure(s) with comments: CATARACT EXTRACTION PHACO AND INTRAOCULAR LENS PLACEMENT (IOC) (Right) - CDE 7.00  Patient Location: Short Stay  Anesthesia Type:MAC  Level of Consciousness: awake, alert , oriented and patient cooperative  Airway & Oxygen Therapy: Patient Spontanous Breathing  Post-op Assessment: Report given to PACU RN, Post -op Vital signs reviewed and stable and Patient moving all extremities  Post vital signs: Reviewed and stable  Complications: No apparent anesthesia complications

## 2013-04-02 NOTE — Anesthesia Postprocedure Evaluation (Signed)
  Anesthesia Post-op Note  Patient: [name]  Procedure(s) Performed: Procedure(s) with comments: CATARACT EXTRACTION PHACO AND INTRAOCULAR LENS PLACEMENT (IOC) (Right) - CDE 7.00  Patient Location: Short Stay  Anesthesia Type:MAC  Level of Consciousness: awake, alert , oriented and patient cooperative  Airway and Oxygen Therapy: Patient Spontanous Breathing  Post-op Pain: none  Post-op Assessment: Post-op Vital signs reviewed, Patient's Cardiovascular Status Stable, Respiratory Function Stable, Patent Airway, No signs of Nausea or vomiting and Pain level controlled  Post-op Vital Signs: Reviewed and stable  Complications: No apparent anesthesia complications

## 2013-04-06 ENCOUNTER — Encounter (HOSPITAL_COMMUNITY): Payer: Self-pay | Admitting: Ophthalmology

## 2013-09-17 ENCOUNTER — Encounter (HOSPITAL_COMMUNITY): Payer: Self-pay | Admitting: Pharmacy Technician

## 2013-09-28 ENCOUNTER — Encounter (HOSPITAL_COMMUNITY): Admission: RE | Admit: 2013-09-28 | Payer: Medicare HMO | Source: Ambulatory Visit

## 2013-09-28 NOTE — Patient Instructions (Signed)
Your procedure is scheduled on: 10/04/2013  Report to [facility] at  2  AM.  Call this number if you have problems the morning of surgery: [phone]   Do not eat food or drink liquids :After Midnight.      Take these medicines the morning of surgery with A SIP OF WATER: xanax, amlodipine, lisinopril, prilosec   Do not wear jewelry, make-up or nail polish.  Do not wear lotions, powders, or perfumes.   Do not shave 48 hours prior to surgery.  Do not bring valuables to the hospital.  Contacts, dentures or bridgework may not be worn into surgery.  Leave suitcase in the car. After surgery it may be brought to your room.  For patients admitted to the hospital, checkout time is 11:00 AM the day of discharge.   Patients discharged the day of surgery will not be allowed to drive home.  :     Please read over the following fact sheets that you were given: Coughing and Deep Breathing, Surgical Site Infection Prevention, Anesthesia Post-op Instructions and Care and Recovery After Surgery    Cataract A cataract is a clouding of the lens of the eye. When a lens becomes cloudy, vision is reduced based on the degree and nature of the clouding. Many cataracts reduce vision to some degree. Some cataracts make people more near-sighted as they develop. Other cataracts increase glare. Cataracts that are ignored and become worse can sometimes look white. The white color can be seen through the pupil. CAUSES   Aging. However, cataracts may occur at any age, even in newborns.   Certain drugs.   Trauma to the eye.   Certain diseases such as diabetes.   Specific eye diseases such as chronic inflammation inside the eye or a sudden attack of a rare form of glaucoma.   Inherited or acquired medical problems.  SYMPTOMS   Gradual, progressive drop in vision in the affected eye.   Severe, rapid visual loss. This most often happens when trauma is the cause.  DIAGNOSIS  To detect a cataract, an eye doctor  examines the lens. Cataracts are best diagnosed with an exam of the eyes with the pupils enlarged (dilated) by drops.  TREATMENT  For an early cataract, vision may improve by using different eyeglasses or stronger lighting. If that does not help your vision, surgery is the only effective treatment. A cataract needs to be surgically removed when vision loss interferes with your everyday activities, such as driving, reading, or watching TV. A cataract may also have to be removed if it prevents examination or treatment of another eye problem. Surgery removes the cloudy lens and usually replaces it with a substitute lens (intraocular lens, IOL).  At a time when both you and your doctor agree, the cataract will be surgically removed. If you have cataracts in both eyes, only one is usually removed at a time. This allows the operated eye to heal and be out of danger from any possible problems after surgery (such as infection or poor wound healing). In rare cases, a cataract may be doing damage to your eye. In these cases, your caregiver may advise surgical removal right away. The vast majority of people who have cataract surgery have better vision afterward. HOME CARE INSTRUCTIONS  If you are not planning surgery, you may be asked to do the following:  Use different eyeglasses.   Use stronger or brighter lighting.   Ask your eye doctor about reducing your medicine dose or  changing medicines if it is thought that a medicine caused your cataract. Changing medicines does not make the cataract go away on its own.   Become familiar with your surroundings. Poor vision can lead to injury. Avoid bumping into things on the affected side. You are at a higher risk for tripping or falling.   Exercise extreme care when driving or operating machinery.   Wear sunglasses if you are sensitive to bright light or experiencing problems with glare.  SEEK IMMEDIATE MEDICAL CARE IF:   You have a worsening or sudden vision  loss.   You notice redness, swelling, or increasing pain in the eye.   You have a fever.  Document Released: 07/22/2005 Document Revised: 07/11/2011 Document Reviewed: 03/15/2011 [facility] Patient Information 2012 [place].PATIENT INSTRUCTIONS POST-ANESTHESIA  IMMEDIATELY FOLLOWING SURGERY:  Do not drive or operate machinery for the first twenty four hours after surgery.  Do not make any important decisions for twenty four hours after surgery or while taking narcotic pain medications or sedatives.  If you develop intractable nausea and vomiting or a severe headache please notify your doctor immediately.  FOLLOW-UP:  Please make an appointment with your surgeon as instructed. You do not need to follow up with anesthesia unless specifically instructed to do so.  WOUND CARE INSTRUCTIONS (if applicable):  Keep a dry clean dressing on the anesthesia/puncture wound site if there is drainage.  Once the wound has quit draining you may leave it open to air.  Generally you should leave the bandage intact for twenty four hours unless there is drainage.  If the epidural site drains for more than 36-48 hours please call the anesthesia department.  QUESTIONS?:  Please feel free to call your physician or the hospital operator if you have any questions, and they will be happy to assist you.

## 2013-09-29 ENCOUNTER — Encounter (HOSPITAL_COMMUNITY): Payer: Self-pay

## 2013-09-29 ENCOUNTER — Encounter (HOSPITAL_COMMUNITY)
Admission: RE | Admit: 2013-09-29 | Discharge: 2013-09-29 | Disposition: A | Discharge status: Home / Self Care | Payer: Medicare HMO | Source: Ambulatory Visit | Attending: Ophthalmology | Admitting: Ophthalmology

## 2013-09-29 DIAGNOSIS — Z01812 Encounter for preprocedural laboratory examination: Secondary | ICD-10-CM | POA: Insufficient documentation

## 2013-09-29 HISTORY — DX: Sleep apnea, unspecified: G47.30

## 2013-09-29 LAB — BASIC METABOLIC PANEL
BUN: 13 mg/dL (ref 6–23)
CALCIUM: 9.4 mg/dL (ref 8.4–10.5)
CO2: 26 mEq/L (ref 19–32)
CREATININE: 0.82 mg/dL (ref 0.50–1.35)
Chloride: 104 mEq/L (ref 96–112)
GFR calc Af Amer: >90 mL/min (ref >90)
GLUCOSE: 160 mg/dL — AB (ref 70–99)
Potassium: 4.3 mEq/L (ref 3.7–5.3)
SODIUM: 142 meq/L (ref 137–147)

## 2013-09-29 LAB — HEMOGLOBIN AND HEMATOCRIT, BLOOD
HEMATOCRIT: 37.3 % — AB (ref 39.0–52.0)
Hemoglobin: 11.9 g/dL — ABNORMAL LOW (ref 13.0–17.0)

## 2013-09-29 NOTE — Progress Notes (Signed)
09/29/13 1051  OBSTRUCTIVE SLEEP APNEA  Have you ever been diagnosed with sleep apnea through a sleep study? No  Do you snore loudly (loud enough to be heard through closed doors)?  0  Do you often feel tired, fatigued, or sleepy during the daytime? 0  Has anyone observed you stop breathing during your sleep? 0  Do you have, or are you being treated for high blood pressure? 1  BMI more than 35 kg/m2? 1  Age over 63 years old? 1  Neck circumference greater than 40 cm/18 inches? 0  Gender: 1  Obstructive Sleep Apnea Score 4  Score 4 or greater  Results sent to PCP

## 2013-10-04 ENCOUNTER — Encounter (HOSPITAL_COMMUNITY): Payer: Self-pay

## 2013-10-04 ENCOUNTER — Encounter (HOSPITAL_COMMUNITY): Payer: Medicare HMO | Admitting: Anesthesiology

## 2013-10-04 ENCOUNTER — Encounter (HOSPITAL_COMMUNITY)
Admission: RE | Disposition: A | Discharge status: Home Health | Payer: Self-pay | Source: Ambulatory Visit | Attending: Ophthalmology

## 2013-10-04 ENCOUNTER — Ambulatory Visit (HOSPITAL_COMMUNITY): Payer: Medicare HMO | Admitting: Anesthesiology

## 2013-10-04 ENCOUNTER — Ambulatory Visit (HOSPITAL_COMMUNITY)
Admission: RE | Admit: 2013-10-04 | Discharge: 2013-10-04 | Disposition: A | Discharge status: Home Health | Payer: Medicare HMO | Source: Ambulatory Visit | Attending: Ophthalmology | Admitting: Ophthalmology

## 2013-10-04 DIAGNOSIS — E119 Type 2 diabetes mellitus without complications: Secondary | ICD-10-CM | POA: Insufficient documentation

## 2013-10-04 DIAGNOSIS — H2510 Age-related nuclear cataract, unspecified eye: Secondary | ICD-10-CM | POA: Insufficient documentation

## 2013-10-04 DIAGNOSIS — F411 Generalized anxiety disorder: Secondary | ICD-10-CM | POA: Insufficient documentation

## 2013-10-04 DIAGNOSIS — I10 Essential (primary) hypertension: Secondary | ICD-10-CM | POA: Insufficient documentation

## 2013-10-04 DIAGNOSIS — Z87891 Personal history of nicotine dependence: Secondary | ICD-10-CM | POA: Insufficient documentation

## 2013-10-04 DIAGNOSIS — Z79899 Other long term (current) drug therapy: Secondary | ICD-10-CM | POA: Insufficient documentation

## 2013-10-04 HISTORY — PX: CATARACT EXTRACTION W/PHACO: SHX586

## 2013-10-04 LAB — GLUCOSE, CAPILLARY: GLUCOSE-CAPILLARY: 135 mg/dL — AB (ref 70–99)

## 2013-10-04 SURGERY — PHACOEMULSIFICATION, CATARACT, WITH IOL INSERTION
Anesthesia: Monitor Anesthesia Care | Site: Eye | Laterality: Left

## 2013-10-04 MED ORDER — FENTANYL CITRATE 0.05 MG/ML IJ SOLN
25.0000 ug | INTRAMUSCULAR | Status: AC
  Administered 2013-10-04 (×2): 25 ug via INTRAVENOUS

## 2013-10-04 MED ORDER — MIDAZOLAM HCL 2 MG/2ML IJ SOLN
1.0000 mg | INTRAMUSCULAR | Status: DC | PRN
  Administered 2013-10-04 (×2): 2 mg via INTRAVENOUS
  Filled 2013-10-04: qty 2

## 2013-10-04 MED ORDER — LACTATED RINGERS IV SOLN
INTRAVENOUS | Status: DC
  Administered 2013-10-04: 07:00:00 via INTRAVENOUS

## 2013-10-04 MED ORDER — BSS IO SOLN
INTRAOCULAR | Status: DC | PRN
  Administered 2013-10-04: 15 mL via INTRAOCULAR

## 2013-10-04 MED ORDER — EPINEPHRINE HCL 1 MG/ML IJ SOLN
INTRAMUSCULAR | Status: AC
  Filled 2013-10-04: qty 1

## 2013-10-04 MED ORDER — CYCLOPENTOLATE-PHENYLEPHRINE 0.2-1 % OP SOLN
1.0000 [drp] | OPHTHALMIC | Status: AC
  Administered 2013-10-04 (×3): 1 [drp] via OPHTHALMIC

## 2013-10-04 MED ORDER — NA HYALUR & NA CHOND-NA HYALUR 0.55-0.5 ML IO KIT
PACK | INTRAOCULAR | Status: DC | PRN
  Administered 2013-10-04: 1 via OPHTHALMIC

## 2013-10-04 MED ORDER — MIDAZOLAM HCL 2 MG/2ML IJ SOLN
INTRAMUSCULAR | Status: AC
  Filled 2013-10-04: qty 2

## 2013-10-04 MED ORDER — TETRACAINE 0.5 % OP SOLN OPTIME - NO CHARGE
OPHTHALMIC | Status: DC | PRN
  Administered 2013-10-04: 1 [drp] via OPHTHALMIC

## 2013-10-04 MED ORDER — LIDOCAINE HCL (PF) 1 % IJ SOLN
INTRAMUSCULAR | Status: AC
  Filled 2013-10-04: qty 2

## 2013-10-04 MED ORDER — FENTANYL CITRATE 0.05 MG/ML IJ SOLN
INTRAMUSCULAR | Status: AC
  Filled 2013-10-04: qty 2

## 2013-10-04 MED ORDER — CYCLOPENTOLATE-PHENYLEPHRINE OP SOLN OPTIME - NO CHARGE
OPHTHALMIC | Status: AC
  Filled 2013-10-04: qty 2

## 2013-10-04 MED ORDER — LIDOCAINE HCL 3.5 % OP GEL
OPHTHALMIC | Status: DC | PRN
  Administered 2013-10-04: 1 via OPHTHALMIC

## 2013-10-04 MED ORDER — POVIDONE-IODINE 5 % OP SOLN
OPHTHALMIC | Status: DC | PRN
  Administered 2013-10-04: 1 via OPHTHALMIC

## 2013-10-04 MED ORDER — EPINEPHRINE HCL 1 MG/ML IJ SOLN
INTRAOCULAR | Status: DC | PRN
  Administered 2013-10-04: 08:00:00

## 2013-10-04 MED ORDER — LIDOCAINE HCL 3.5 % OP GEL: 1.0000 "application " | Freq: Once | OPHTHALMIC | Status: DC

## 2013-10-04 MED ORDER — LIDOCAINE HCL 3.5 % OP GEL
OPHTHALMIC | Status: AC
  Filled 2013-10-04: qty 1

## 2013-10-04 MED ORDER — TETRACAINE HCL 0.5 % OP SOLN
1.0000 [drp] | OPHTHALMIC | Status: AC
  Administered 2013-10-04 (×3): 1 [drp] via OPHTHALMIC

## 2013-10-04 MED ORDER — TETRACAINE HCL 0.5 % OP SOLN
OPHTHALMIC | Status: AC
  Filled 2013-10-04: qty 2

## 2013-10-04 SURGICAL SUPPLY — 27 items
CAPSULAR TENSION RING-AMO (OPHTHALMIC RELATED) IMPLANT
CLOTH BEACON ORANGE TIMEOUT ST (SAFETY) ×2 IMPLANT
GLOVE BIO SURGEON STRL SZ7.5 (GLOVE) IMPLANT
GLOVE BIOGEL M 6.5 STRL (GLOVE) IMPLANT
GLOVE BIOGEL PI IND STRL 6.5 (GLOVE) ×1 IMPLANT
GLOVE BIOGEL PI IND STRL 7.0 (GLOVE) IMPLANT
GLOVE BIOGEL PI INDICATOR 6.5 (GLOVE) ×1
GLOVE BIOGEL PI INDICATOR 7.0 (GLOVE)
GLOVE ECLIPSE 6.5 STRL STRAW (GLOVE) IMPLANT
GLOVE ECLIPSE 7.5 STRL STRAW (GLOVE) IMPLANT
GLOVE EXAM NITRILE LRG STRL (GLOVE) IMPLANT
GLOVE EXAM NITRILE MD LF STRL (GLOVE) ×2 IMPLANT
GLOVE SKINSENSE NS SZ6.5 (GLOVE)
GLOVE SKINSENSE NS SZ7.0 (GLOVE)
GLOVE SKINSENSE STRL SZ6.5 (GLOVE) IMPLANT
GLOVE SKINSENSE STRL SZ7.0 (GLOVE) IMPLANT
INST SET CATARACT ~~LOC~~ (KITS) ×2 IMPLANT
KIT VITRECTOMY (OPHTHALMIC RELATED) IMPLANT
PAD ARMBOARD 7.5X6 YLW CONV (MISCELLANEOUS) ×2 IMPLANT
PROC W NO LENS (INTRAOCULAR LENS)
PROC W SPEC LENS (INTRAOCULAR LENS)
PROCESS W NO LENS (INTRAOCULAR LENS) IMPLANT
PROCESS W SPEC LENS (INTRAOCULAR LENS) IMPLANT
RING MALYGIN (MISCELLANEOUS) IMPLANT
SIGHTPATH CAT PROC W REG LENS (Ophthalmic Related) ×2 IMPLANT
VISCOELASTIC ADDITIONAL (OPHTHALMIC RELATED) IMPLANT
WATER STERILE IRR 250ML POUR (IV SOLUTION) ×2 IMPLANT

## 2013-10-04 NOTE — Op Note (Signed)
See scanned note.

## 2013-10-04 NOTE — H&P (Signed)
I have reviewed the pre printed H&P, the patient was re-examined, and I have identified no significant interval changes in the patient's medical condition.  There is no change in the plan of care since the history and physical of record. 

## 2013-10-04 NOTE — Transfer of Care (Signed)
Immediate Anesthesia Transfer of Care Note  Patient: [name]  Procedure(s) Performed: Procedure(s) (LRB): CATARACT EXTRACTION PHACO AND INTRAOCULAR LENS PLACEMENT (IOC) (Left)  Patient Location: Shortstay  Anesthesia Type: MAC  Level of Consciousness: awake  Airway & Oxygen Therapy: Patient Spontanous Breathing   Post-op Assessment: Report given to PACU RN, Post -op Vital signs reviewed and stable and Patient moving all extremities  Post vital signs: Reviewed and stable  Complications: No apparent anesthesia complications

## 2013-10-04 NOTE — Anesthesia Postprocedure Evaluation (Signed)
  Anesthesia Post-op Note  Patient: [name]  Procedure(s) Performed: Procedure(s) (LRB): CATARACT EXTRACTION PHACO AND INTRAOCULAR LENS PLACEMENT (IOC) (Left)  Patient Location:  Short Stay  Anesthesia Type: MAC  Level of Consciousness: awake  Airway and Oxygen Therapy: Patient Spontanous Breathing  Post-op Pain: none  Post-op Assessment: Post-op Vital signs reviewed, Patient's Cardiovascular Status Stable, Respiratory Function Stable, Patent Airway, No signs of Nausea or vomiting and Pain level controlled  Post-op Vital Signs: Reviewed and stable  Complications: No apparent anesthesia complications

## 2013-10-04 NOTE — Anesthesia Procedure Notes (Signed)
Procedure Name: MAC Date/Time: 10/04/2013 7:31 AM Performed by: Vista Deck Pre-anesthesia Checklist: Patient identified, Emergency Drugs available, Suction available, Timeout performed and Patient being monitored Patient Re-evaluated:Patient Re-evaluated prior to inductionOxygen Delivery Method: Nasal Cannula

## 2013-10-04 NOTE — Anesthesia Preprocedure Evaluation (Signed)
Anesthesia Evaluation  Patient identified by MRN, date of birth, ID band Patient awake    Reviewed: Allergy & Precautions, H&P , NPO status , Patient's Chart, lab work & pertinent test results  Airway Mallampati: II TM Distance: >3 FB     Dental  (+) Edentulous Upper, Partial Lower   Pulmonary former smoker,  breath sounds clear to auscultation        Cardiovascular hypertension, Pt. on medications Rhythm:Regular Rate:Normal     Neuro/Psych PSYCHIATRIC DISORDERS Anxiety    GI/Hepatic GERD-  Medicated and Controlled,  Endo/Other  diabetes, Well Controlled, Type 2, Oral Hypoglycemic Agents  Renal/GU      Musculoskeletal   Abdominal   Peds  Hematology   Anesthesia Other Findings   Reproductive/Obstetrics                           Anesthesia Physical Anesthesia Plan  ASA: III  Anesthesia Plan: MAC   Post-op Pain Management:    Induction: Intravenous  Airway Management Planned: Nasal Cannula  Additional Equipment:   Intra-op Plan:   Post-operative Plan:   Informed Consent: I have reviewed the patients History and Physical, chart, labs and discussed the procedure including the risks, benefits and alternatives for the proposed anesthesia with the patient or authorized representative who has indicated his/her understanding and acceptance.     Plan Discussed with:   Anesthesia Plan Comments:         Anesthesia Quick Evaluation

## 2013-10-04 NOTE — Brief Op Note (Signed)
10/04/2013  8:26 AM  PATIENT:  [name]  63 y.o. male  PRE-OPERATIVE DIAGNOSIS:  nuclear cataract left eye  POST-OPERATIVE DIAGNOSIS:  nuclear cataract left eye  PROCEDURE:  Procedure(s): CATARACT EXTRACTION PHACO AND INTRAOCULAR LENS PLACEMENT (IOC)  SURGEON:  Surgeon(s): Williams Che, MD  ASSISTANTS: Zoila Shutter, CST   ANESTHESIA STAFF: Anesthesiologist: Lerry Liner, MD CRNA: Vista Deck, CRNA  ANESTHESIA:   topical and MAC  REQUESTED LENS POWER: 23.5  LENS IMPLANT INFORMATION: @ORIMPLANT @  CUMULATIVE DISSIPATED ENERGY:1.46  INDICATIONS:see office H&P  OP FINDINGS:cortical cataract  COMPLICATIONS:None  DICTATION #: see scanned op note  PLAN OF CARE:  As above  PATIENT DISPOSITION:  Short Stay

## 2013-10-04 NOTE — Discharge Instructions (Signed)
[name] 10/04/2013 Dr. Iona Hansen Post operative Instructions for Cataract Patients  These instructions are for [name] and pertain to the operative eye.  1.  Resume your normal diet and previous oral medicines.  2. Your Follow-up appointment is at Dr. Iona Hansen' office in [place] on 10/05/2013 @0915   3. You may leave the hospital when your driver is present and your nurse releases you.  4. Begin Pred Forte (prednisolone acetate 1%), Acular LS (ketorolac tromethamine .4%) and Gatifloxacin 0.5% eye drops; 1 drop each 4 times daily to operative eye. Begin 3 hours after discharge from Short Stay Unit.  Moxifloxacin 0.5% may be substituted for Gatifloxacin using the same instructions.  39. Page Dr. Iona Hansen via beeper [phone] for significant pain in or around operative eye that is not relieved by Tylenol.  6. If you took Plavix before surgery, restart it at the usual dose on the evening of surgery.  7. Wear dark glasses as necessary for excessive light sensitivity.  8. Do no forcefully rub you your operative eye.  9. Keep your operative eye dry for 1 week. You may gently clean your eyelids with a damp washcloth.  10. You may resume normal occupational activities in one week and resume driving as tolerated after the first post operative visit.  11. It is normal to have blurred vision and a scratchy sensation following surgery.  Dr. Iona Hansen: [phone]

## 2013-10-07 ENCOUNTER — Encounter (HOSPITAL_COMMUNITY): Payer: Self-pay | Admitting: Ophthalmology

## 2013-10-08 ENCOUNTER — Ambulatory Visit (INDEPENDENT_AMBULATORY_CARE_PROVIDER_SITE_OTHER): Payer: Medicare HMO | Admitting: Urology

## 2013-10-08 DIAGNOSIS — N529 Male erectile dysfunction, unspecified: Secondary | ICD-10-CM

## 2013-12-21 DIAGNOSIS — E1169 Type 2 diabetes mellitus with other specified complication: Secondary | ICD-10-CM | POA: Insufficient documentation

## 2015-01-03 DIAGNOSIS — M8949 Other hypertrophic osteoarthropathy, multiple sites: Secondary | ICD-10-CM | POA: Insufficient documentation

## 2015-01-03 DIAGNOSIS — M545 Low back pain, unspecified: Secondary | ICD-10-CM | POA: Insufficient documentation

## 2015-01-03 DIAGNOSIS — M159 Polyosteoarthritis, unspecified: Secondary | ICD-10-CM | POA: Insufficient documentation

## 2015-01-03 DIAGNOSIS — G8929 Other chronic pain: Secondary | ICD-10-CM | POA: Insufficient documentation

## 2015-01-03 DIAGNOSIS — E1159 Type 2 diabetes mellitus with other circulatory complications: Secondary | ICD-10-CM | POA: Insufficient documentation

## 2015-01-03 DIAGNOSIS — F411 Generalized anxiety disorder: Secondary | ICD-10-CM | POA: Insufficient documentation

## 2015-01-03 DIAGNOSIS — E559 Vitamin D deficiency, unspecified: Secondary | ICD-10-CM | POA: Insufficient documentation

## 2015-01-03 DIAGNOSIS — I152 Hypertension secondary to endocrine disorders: Secondary | ICD-10-CM | POA: Insufficient documentation

## 2015-05-10 ENCOUNTER — Encounter (INDEPENDENT_AMBULATORY_CARE_PROVIDER_SITE_OTHER): Payer: Self-pay | Admitting: *Deleted

## 2015-05-17 ENCOUNTER — Other Ambulatory Visit (INDEPENDENT_AMBULATORY_CARE_PROVIDER_SITE_OTHER): Payer: Self-pay | Admitting: *Deleted

## 2015-05-17 ENCOUNTER — Encounter (INDEPENDENT_AMBULATORY_CARE_PROVIDER_SITE_OTHER): Payer: Self-pay | Admitting: *Deleted

## 2015-05-17 DIAGNOSIS — Z1211 Encounter for screening for malignant neoplasm of colon: Secondary | ICD-10-CM

## 2015-06-06 ENCOUNTER — Telehealth (INDEPENDENT_AMBULATORY_CARE_PROVIDER_SITE_OTHER): Payer: Self-pay | Admitting: *Deleted

## 2015-06-06 DIAGNOSIS — Z1211 Encounter for screening for malignant neoplasm of colon: Secondary | ICD-10-CM

## 2015-06-06 NOTE — Telephone Encounter (Signed)
Patient needs suprep 

## 2015-06-12 MED ORDER — SUPREP BOWEL PREP KIT 17.5-3.13-1.6 GM/177ML PO SOLN: 1.0000 | Freq: Once | ORAL | Status: DC

## 2015-07-03 ENCOUNTER — Encounter (HOSPITAL_COMMUNITY)
Admission: RE | Disposition: A | Discharge status: Home Health | Payer: Self-pay | Source: Ambulatory Visit | Attending: Internal Medicine

## 2015-07-03 ENCOUNTER — Encounter (HOSPITAL_COMMUNITY): Payer: Self-pay | Admitting: *Deleted

## 2015-07-03 ENCOUNTER — Ambulatory Visit (HOSPITAL_COMMUNITY)
Admission: RE | Admit: 2015-07-03 | Discharge: 2015-07-03 | Disposition: A | Discharge status: Home Health | Payer: Medicare HMO | Source: Ambulatory Visit | Attending: Internal Medicine | Admitting: Internal Medicine

## 2015-07-03 DIAGNOSIS — D128 Benign neoplasm of rectum: Secondary | ICD-10-CM

## 2015-07-03 DIAGNOSIS — D123 Benign neoplasm of transverse colon: Secondary | ICD-10-CM | POA: Insufficient documentation

## 2015-07-03 DIAGNOSIS — Z1211 Encounter for screening for malignant neoplasm of colon: Secondary | ICD-10-CM | POA: Insufficient documentation

## 2015-07-03 DIAGNOSIS — E119 Type 2 diabetes mellitus without complications: Secondary | ICD-10-CM | POA: Insufficient documentation

## 2015-07-03 DIAGNOSIS — Q2733 Arteriovenous malformation of digestive system vessel: Secondary | ICD-10-CM | POA: Diagnosis not present

## 2015-07-03 DIAGNOSIS — G4730 Sleep apnea, unspecified: Secondary | ICD-10-CM | POA: Diagnosis not present

## 2015-07-03 DIAGNOSIS — K644 Residual hemorrhoidal skin tags: Secondary | ICD-10-CM | POA: Insufficient documentation

## 2015-07-03 DIAGNOSIS — Z87891 Personal history of nicotine dependence: Secondary | ICD-10-CM | POA: Diagnosis not present

## 2015-07-03 DIAGNOSIS — F419 Anxiety disorder, unspecified: Secondary | ICD-10-CM | POA: Insufficient documentation

## 2015-07-03 DIAGNOSIS — M1990 Unspecified osteoarthritis, unspecified site: Secondary | ICD-10-CM | POA: Diagnosis not present

## 2015-07-03 DIAGNOSIS — Z794 Long term (current) use of insulin: Secondary | ICD-10-CM | POA: Insufficient documentation

## 2015-07-03 DIAGNOSIS — I10 Essential (primary) hypertension: Secondary | ICD-10-CM | POA: Diagnosis not present

## 2015-07-03 DIAGNOSIS — D122 Benign neoplasm of ascending colon: Secondary | ICD-10-CM | POA: Insufficient documentation

## 2015-07-03 DIAGNOSIS — E7800 Pure hypercholesterolemia, unspecified: Secondary | ICD-10-CM | POA: Insufficient documentation

## 2015-07-03 DIAGNOSIS — K5730 Diverticulosis of large intestine without perforation or abscess without bleeding: Secondary | ICD-10-CM

## 2015-07-03 DIAGNOSIS — K219 Gastro-esophageal reflux disease without esophagitis: Secondary | ICD-10-CM | POA: Insufficient documentation

## 2015-07-03 DIAGNOSIS — Z79899 Other long term (current) drug therapy: Secondary | ICD-10-CM | POA: Insufficient documentation

## 2015-07-03 DIAGNOSIS — D124 Benign neoplasm of descending colon: Secondary | ICD-10-CM | POA: Diagnosis not present

## 2015-07-03 HISTORY — PX: COLONOSCOPY: SHX5424

## 2015-07-03 LAB — GLUCOSE, CAPILLARY: GLUCOSE-CAPILLARY: 143 mg/dL — AB (ref 65–99)

## 2015-07-03 SURGERY — COLONOSCOPY
Anesthesia: Moderate Sedation

## 2015-07-03 MED ORDER — SODIUM CHLORIDE 0.9 % IV SOLN
INTRAVENOUS | Status: DC
Start: 2015-07-03 — End: 2015-07-03
  Administered 2015-07-03: 1000 mL via INTRAVENOUS

## 2015-07-03 MED ORDER — STERILE WATER FOR IRRIGATION IR SOLN
Status: DC | PRN
  Administered 2015-07-03: 250 mL

## 2015-07-03 MED ORDER — MEPERIDINE HCL 50 MG/ML IJ SOLN
INTRAMUSCULAR | Status: AC
  Filled 2015-07-03: qty 1

## 2015-07-03 MED ORDER — MIDAZOLAM HCL 5 MG/5ML IJ SOLN
INTRAMUSCULAR | Status: DC | PRN
  Administered 2015-07-03 (×2): 3 mg via INTRAVENOUS
  Administered 2015-07-03 (×2): 2 mg via INTRAVENOUS

## 2015-07-03 MED ORDER — MEPERIDINE HCL 50 MG/ML IJ SOLN
INTRAMUSCULAR | Status: DC
Start: 2015-07-03 — End: 2015-07-03
  Filled 2015-07-03: qty 1

## 2015-07-03 MED ORDER — MIDAZOLAM HCL 5 MG/5ML IJ SOLN
INTRAMUSCULAR | Status: AC
  Filled 2015-07-03: qty 10

## 2015-07-03 MED ORDER — MEPERIDINE HCL 50 MG/ML IJ SOLN
INTRAMUSCULAR | Status: DC | PRN
  Administered 2015-07-03 (×4): 25 mg via INTRAVENOUS

## 2015-07-03 NOTE — Discharge Instructions (Signed)
No aspirin or NSAIDs for 1 week. Resume usual medications and high-fiber diet. No driving for 24 hours. Physician will call with biopsy results  Colonoscopy, Care After Refer to this sheet in the next few weeks. These instructions provide you with information on caring for yourself after your procedure. Your health care provider may also give you more specific instructions. Your treatment has been planned according to current medical practices, but problems sometimes occur. Call your health care provider if you have any problems or questions after your procedure. WHAT TO EXPECT AFTER THE PROCEDURE  After your procedure, it is typical to have the following:  A small amount of blood in your stool.  Moderate amounts of gas and mild abdominal cramping or bloating. HOME CARE INSTRUCTIONS  Do not drive, operate machinery, or sign important documents for 24 hours.  You may shower and resume your regular physical activities, but move at a slower pace for the first 24 hours.  Take frequent rest periods for the first 24 hours.  Walk around or put a warm pack on your abdomen to help reduce abdominal cramping and bloating.  Drink enough fluids to keep your urine clear or pale yellow.  You may resume your normal diet as instructed by your health care provider. Avoid heavy or fried foods that are hard to digest.  Avoid drinking alcohol for 24 hours or as instructed by your health care provider.  Only take over-the-counter or prescription medicines as directed by your health care provider.  If a tissue sample (biopsy) was taken during your procedure:  Do not take aspirin or blood thinners for 7 days, or as instructed by your health care provider.  Do not drink alcohol for 7 days, or as instructed by your health care provider.  Eat soft foods for the first 24 hours. SEEK MEDICAL CARE IF: You have persistent spotting of blood in your stool 2-3 days after the procedure. SEEK IMMEDIATE MEDICAL  CARE IF:  You have more than a small spotting of blood in your stool.  You pass large blood clots in your stool.  Your abdomen is swollen (distended).  You have nausea or vomiting.  You have a fever.  You have increasing abdominal pain that is not relieved with medicine.   This information is not intended to replace advice given to you by your health care provider. Make sure you discuss any questions you have with your health care provider.   Colon Polyps Polyps are lumps of extra tissue growing inside the body. Polyps can grow in the large intestine (colon). Most colon polyps are noncancerous (benign). However, some colon polyps can become cancerous over time. Polyps that are larger than a pea may be harmful. To be safe, caregivers remove and test all polyps. CAUSES  Polyps form when mutations in the genes cause your cells to grow and divide even though no more tissue is needed. RISK FACTORS There are a number of risk factors that can increase your chances of getting colon polyps. They include:  Being older than 50 years.  Family history of colon polyps or colon cancer.  Long-term colon diseases, such as colitis or Crohn disease.  Being overweight.  Smoking.  Being inactive.  Drinking too much alcohol. SYMPTOMS  Most small polyps do not cause symptoms. If symptoms are present, they may include:  Blood in the stool. The stool may look dark red or black.  Constipation or diarrhea that lasts longer than 1 week. DIAGNOSIS People often do not know  they have polyps until their caregiver finds them during a regular checkup. Your caregiver can use 4 tests to check for polyps:  Digital rectal exam. The caregiver wears gloves and feels inside the rectum. This test would find polyps only in the rectum.  Barium enema. The caregiver puts a liquid called barium into your rectum before taking X-rays of your colon. Barium makes your colon look white. Polyps are dark, so they are easy  to see in the X-ray pictures.  Sigmoidoscopy. A thin, flexible tube (sigmoidoscope) is placed into your rectum. The sigmoidoscope has a light and tiny camera in it. The caregiver uses the sigmoidoscope to look at the last third of your colon.  Colonoscopy. This test is like sigmoidoscopy, but the caregiver looks at the entire colon. This is the most common method for finding and removing polyps. TREATMENT  Any polyps will be removed during a sigmoidoscopy or colonoscopy. The polyps are then tested for cancer. PREVENTION  To help lower your risk of getting more colon polyps:  Eat plenty of fruits and vegetables. Avoid eating fatty foods.  Do not smoke.  Avoid drinking alcohol.  Exercise every day.  Lose weight if recommended by your caregiver.  Eat plenty of calcium and folate. Foods that are rich in calcium include milk, cheese, and broccoli. Foods that are rich in folate include chickpeas, kidney beans, and spinach. HOME CARE INSTRUCTIONS Keep all follow-up appointments as directed by your caregiver. You may need periodic exams to check for polyps. SEEK MEDICAL CARE IF: You notice bleeding during a bowel movement.   This information is not intended to replace advice given to you by your health care provider. Make sure you discuss any questions you have with your health care provider.   High-Fiber Diet Fiber, also called dietary fiber, is a type of carbohydrate found in fruits, vegetables, whole grains, and beans. A high-fiber diet can have many health benefits. Your health care provider may recommend a high-fiber diet to help:  Prevent constipation. Fiber can make your bowel movements more regular.  Lower your cholesterol.  Relieve hemorrhoids, uncomplicated diverticulosis, or irritable bowel syndrome.  Prevent overeating as part of a weight-loss plan.  Prevent heart disease, type 2 diabetes, and certain cancers. WHAT IS MY PLAN? The recommended daily intake of fiber  includes:  38 grams for men under age 79.  81 grams for men over age 30.  71 grams for women under age 79.  40 grams for women over age 50. You can get the recommended daily intake of dietary fiber by eating a variety of fruits, vegetables, grains, and beans. Your health care provider may also recommend a fiber supplement if it is not possible to get enough fiber through your diet. WHAT DO I NEED TO KNOW ABOUT A HIGH-FIBER DIET?  Fiber supplements have not been widely studied for their effectiveness, so it is better to get fiber through food sources.  Always check the fiber content on thenutrition facts label of any prepackaged food. Look for foods that contain at least 5 grams of fiber per serving.  Ask your dietitian if you have questions about specific foods that are related to your condition, especially if those foods are not listed in the following section.  Increase your daily fiber consumption gradually. Increasing your intake of dietary fiber too quickly may cause bloating, cramping, or gas.  Drink plenty of water. Water helps you to digest fiber. WHAT FOODS CAN I EAT? Grains Whole-grain breads. Multigrain cereal. Oats and  oatmeal. Brown rice. Barley. Bulgur wheat. Linn. Bran muffins. Popcorn. Rye wafer crackers. Vegetables Sweet potatoes. Spinach. Kale. Artichokes. Cabbage. Broccoli. Green peas. Carrots. Squash. Fruits Berries. Pears. Apples. Oranges. Avocados. Prunes and raisins. Dried figs. Meats and Other Protein Sources Navy, kidney, pinto, and soy beans. Split peas. Lentils. Nuts and seeds. Dairy Fiber-fortified yogurt. Beverages Fiber-fortified soy milk. Fiber-fortified orange juice. Other Fiber bars. The items listed above may not be a complete list of recommended foods or beverages. Contact your dietitian for more options. WHAT FOODS ARE NOT RECOMMENDED? Grains White bread. Pasta made with refined flour. White rice. Vegetables Fried potatoes. Canned  vegetables. Well-cooked vegetables.  Fruits Fruit juice. Cooked, strained fruit. Meats and Other Protein Sources Fatty cuts of meat. Fried Sales executive or fried fish. Dairy Milk. Yogurt. Cream cheese. Sour cream. Beverages Soft drinks. Other Cakes and pastries. Butter and oils. The items listed above may not be a complete list of foods and beverages to avoid. Contact your dietitian for more information. WHAT ARE SOME TIPS FOR INCLUDING HIGH-FIBER FOODS IN MY DIET?  Eat a wide variety of high-fiber foods.  Make sure that half of all grains consumed each day are whole grains.  Replace breads and cereals made from refined flour or white flour with whole-grain breads and cereals.  Replace white rice with brown rice, bulgur wheat, or millet.  Start the day with a breakfast that is high in fiber, such as a cereal that contains at least 5 grams of fiber per serving.  Use beans in place of meat in soups, salads, or pasta.  Eat high-fiber snacks, such as berries, raw vegetables, nuts, or popcorn.   This information is not intended to replace advice given to you by your health care provider. Make sure you discuss any questions you have with your health care provider.   Document Released: 07/22/2005 Document Revised: 08/12/2014 Document Reviewed: 01/04/2014 Elsevier Interactive Patient Education [employer].

## 2015-07-03 NOTE — Op Note (Addendum)
COLONOSCOPY PROCEDURE REPORT  PATIENT:  [name]  MR#:  [id number] Birthdate:  [birth year], 64 y.o., male Endoscopist:  Dr. Rogene Houston, MD Referred By:  Dr. Sherrie Mustache, MD Procedure Date: 07/03/2015  Procedure:   Colonoscopy with snare polypectomy  Indications:  Patient is 64 year old African-American male was undergoing average risk screening colonoscopy. His last exam was 10 years ago.  Informed Consent:  The procedure and risks were reviewed with the patient and informed consent was obtained.  Medications:  Demerol 100 mg IV Versed 10 mg IV  Description of procedure:  After a digital rectal exam was performed, that colonoscope was advanced from the anus through the rectum and colon to the area of the cecum, ileocecal valve and appendiceal orifice. The cecum was deeply intubated. These structures were well-seen and photographed for the record. From the level of the cecum and ileocecal valve, the scope was slowly and cautiously withdrawn. The mucosal surfaces were carefully surveyed utilizing scope tip to flexion to facilitate fold flattening as needed. The scope was pulled down into the rectum where a thorough exam including retroflexion was performed.  Findings:   Prep satisfactory. Small polyp at ascending colon was cold snared and was submitted long with another small rectal polyp which is also cold snared. 10 mm broad-based polyp hot snared from ascending colon. Single 360 Applied to polypectomy site. 12 mm polyp hot snared from splenic flexure. 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 Few small vertical at descending and sigmoid colon. Small hemorrhoids below the dentate line.  Therapeutic/Diagnostic Maneuvers Performed:  See above  Complications:  None  EBL: Minimal  Cecal Withdrawal Time:  33 minutes  Impression:  Examination performed to cecum. Two small polyps cold snared and submitted together(ascending colon and rectum). 10 mm polyp hot snared  from ascending colon and single 360 clip applied to polypectomy site. 12 mm polyp hot snare from hepatic flexure. Mild left-sided diverticulosis. Small external hemorrhoids.  Recommendations:  Standard instructions given. No aspirin or NSAIDs for 1 week. High-fiber diet. I will contact patient with biopsy results and further recommendations.  [name]  07/03/2015 10:05 AM  CC: Dr. Sherrie Mustache, MD & Dr. Rayne Du ref. provider found

## 2015-07-03 NOTE — H&P (Signed)
[name] is an 64 y.o. male.   Chief Complaint: Patient is here for colonoscopy. HPI: Patient is 64 year old African-American male who is here for screening examination. He denies abdominal pain change in bowel habits or rectal bleeding. Last colonoscopy was about 10 years ago. Family history is negative for CRC.  Past Medical History  Diagnosis Date  . Anxiety   . Hypertension   . Diabetes mellitus without complication ([place])   . GERD (gastroesophageal reflux disease)   . Hypercholesteremia   . Arthritis   . Sleep apnea     Stop Bang score of 4    Past Surgical History  Procedure Laterality Date  . Boils      removed form back of skull  . Rotator cuff repair Right   . Pilonidal cyst / sinus excision    . Cataract extraction w/phaco Right 04/02/2013    Procedure: CATARACT EXTRACTION PHACO AND INTRAOCULAR LENS PLACEMENT (IOC);  Surgeon: Williams Che, MD;  Location: [facility];  Service: Ophthalmology;  Laterality: Right;  CDE 7.00  . Eye surgery Right     "valve replaced and stent placed" per pt to reroute the fluid in his eye  . Cataract extraction w/phaco Left 10/04/2013    Procedure: CATARACT EXTRACTION PHACO AND INTRAOCULAR LENS PLACEMENT (IOC);  Surgeon: Williams Che, MD;  Location: [facility];  Service: Ophthalmology;  Laterality: Left;  CDE:1.46    History reviewed. No pertinent family history. Social History:  reports that he quit smoking about 3 years ago. His smoking use included Cigarettes. He has a 20.5 pack-year smoking history. He does not have any smokeless tobacco history on file. He reports that he does not drink alcohol or use illicit drugs.  Allergies: No Known Allergies  Medications Prior to Admission  Medication Sig Dispense Refill  . ALPRAZolam (XANAX) 1 MG tablet Take 1 mg by mouth 3 (three) times daily as needed for anxiety.    Marland Kitchen amLODipine (NORVASC) 5 MG tablet Take 5 mg by mouth daily.    Marland Kitchen HYDROcodone-acetaminophen (NORCO) 7.5-325 MG per tablet Take 1  tablet by mouth 3 (three) times daily as needed for pain.    . Insulin Detemir (LEVEMIR FLEXPEN) 100 UNIT/ML Pen Inject 100 Units into the skin every morning.    Marland Kitchen lisinopril (PRINIVIL,ZESTRIL) 40 MG tablet Take 40 mg by mouth daily.    Marland Kitchen omeprazole (PRILOSEC) 20 MG capsule Take 20 mg by mouth daily.    . rosuvastatin (CRESTOR) 20 MG tablet Take 20 mg by mouth daily.    . insulin aspart (NOVOLOG) 100 UNIT/ML injection Inject 10 Units into the skin 3 (three) times daily with meals.    [place] Gunning BOWEL PREP SOLN Take 1 kit by mouth once. 1 Bottle 0  . traMADol (ULTRAM) 50 MG tablet Take by mouth every 6 (six) hours as needed.      Results for orders placed or performed during the hospital encounter of 07/03/15 (from the past 48 hour(s))  Glucose, capillary     Status: Abnormal   Collection Time: 07/03/15  8:01 AM  Result Value Ref Range   Glucose-Capillary 143 (H) 65 - 99 mg/dL   No results found.  ROS  Blood pressure 158/94, pulse 69, temperature 98.4 F (36.9 C), temperature source Oral, resp. rate 13, height 6' (1.829 m), weight 289 lb (131.09 kg), SpO2 99 %. Physical Exam  Constitutional: He appears well-developed and well-nourished.  HENT:  Mouth/Throat: Oropharynx is clear and moist.  Eyes: Conjunctivae are  normal. No scleral icterus.  Neck: No thyromegaly present.  Cardiovascular: Normal rate and regular rhythm.   No murmur heard. Respiratory: Effort normal and breath sounds normal.  GI: Soft. He exhibits no distension and no mass. There is no tenderness.  Musculoskeletal: He exhibits no edema.  Lymphadenopathy:    He has no cervical adenopathy.  Neurological: He is alert.  Skin: Skin is warm and dry.     Assessment/Plan Average risk screening colonoscopy.  REHMAN,NAJEEB U 07/03/2015, 8:49 AM

## 2015-07-05 ENCOUNTER — Other Ambulatory Visit (INDEPENDENT_AMBULATORY_CARE_PROVIDER_SITE_OTHER): Payer: Self-pay | Admitting: *Deleted

## 2015-07-05 ENCOUNTER — Telehealth (INDEPENDENT_AMBULATORY_CARE_PROVIDER_SITE_OTHER): Payer: Self-pay | Admitting: *Deleted

## 2015-07-05 ENCOUNTER — Encounter (HOSPITAL_COMMUNITY): Payer: Self-pay | Admitting: Internal Medicine

## 2015-07-05 NOTE — Telephone Encounter (Signed)
Patient was called at the number provided.  I left the following message per Dr.Rehman. This is normal not to have a bowel movement after having Colonoscopy. He may take Miralax 1/2 to 1 capful daily until he has results. He is not to take a laxative.  Patient was ask to call office back if he had any further questions , and that Dr.Rehman would call with results as they are ready.

## 2015-07-05 NOTE — Telephone Encounter (Signed)
Had TCS Monday, hasn't had BM since then, is this normal, does he need to do something, please advise

## 2015-07-05 NOTE — Telephone Encounter (Signed)
I will address this with Dr.Rehman, then patient will be contacted. 

## 2016-01-30 DIAGNOSIS — K219 Gastro-esophageal reflux disease without esophagitis: Secondary | ICD-10-CM | POA: Insufficient documentation

## 2016-08-22 NOTE — Congregational Nurse Program (Unsigned)
Congregational Nurse Program Note  Date of Encounter: 08/22/2016  Past Medical History: Past Medical History:  Diagnosis Date  . Anxiety   . Arthritis   . Diabetes mellitus without complication ([place])   . GERD (gastroesophageal reflux disease)   . Hypercholesteremia   . Hypertension   . Sleep apnea    Stop Bang score of 4    Encounter Details:     CNP Questionnaire - 08/09/16 1133      Patient Demographics   Is this a new or existing patient? Existing   Patient is considered a/an Not Applicable   Race African-American/Black     Patient Assistance   Location of Patient Assistance [place]   Patient's financial/insurance status Medicare;Medicaid   Uninsured Patient ([place]/Care Connects) No   Patient referred to apply for the following financial assistance Not Applicable   Food insecurities addressed Provided food supplies   Transportation assistance No   Assistance securing medications No   Educational health offerings Hypertension;Diabetes;Nutrition     Encounter Details   Primary purpose of visit Education/Health Concerns   Was an Emergency Department visit averted? Not Applicable   Does patient have a medical provider? Yes   Patient referred to Not Applicable   Was a mental health screening completed? (GAINS tool) No   Does patient have dental issues? No   Does patient have vision issues? No   Does your patient have an abnormal blood pressure today? Yes   Since previous encounter, have you referred patient for abnormal blood pressure that resulted in a new diagnosis or medication change? Yes   Does your patient have an abnormal blood glucose today? No   Since previous encounter, have you referred patient for abnormal blood glucose that resulted in a new diagnosis or medication change? No    Just wanted B/P and Blood Sugar checked, sees his doctor for his checks. BP 145/84 Pulse 73, Blood Sugar 126.  Takes 9 different pills and 2 types of  Insulin. Theron Arista, RN [phone]

## 2016-10-01 NOTE — Congregational Nurse Program (Unsigned)
Congregational Nurse Program Note  Date of Encounter: 10/01/2016  Past Medical History: Past Medical History:  Diagnosis Date  . Anxiety   . Arthritis   . Diabetes mellitus without complication ([place])   . GERD (gastroesophageal reflux disease)   . Hypercholesteremia   . Hypertension   . Sleep apnea    Stop Bang score of 4    Encounter Details:     CNP Questionnaire - 09/27/16 1200      Patient Demographics   Is this a new or existing patient? Existing   Patient is considered a/an Not Applicable   Race Caucasian/White     Patient Assistance   Location of Patient Assistance [place]   Patient's financial/insurance status Medicare;Medicaid   Uninsured Patient ([place]/Care Connects) No   Patient referred to apply for the following financial assistance Not Applicable   Food insecurities addressed Provided food supplies   Transportation assistance No   Assistance securing medications No   Educational health offerings Diabetes;Safety;Nutrition     Encounter Details   Primary purpose of visit Education/Health Concerns   Was an Emergency Department visit averted? Not Applicable   Does patient have a medical provider? Yes   Patient referred to Establish PCP   Was a mental health screening completed? (GAINS tool) No   Does patient have dental issues? No   Does patient have vision issues? Yes   Was a vision referral made? No   Does your patient have an abnormal blood pressure today? No   Since previous encounter, have you referred patient for abnormal blood pressure that resulted in a new diagnosis or medication change? No   Does your patient have an abnormal blood glucose today? No   Since previous encounter, have you referred patient for abnormal blood glucose that resulted in a new diagnosis or medication change? No   Was there a life-saving intervention made? No    09/27/16 Check (R) Eye. Suspenders hit (eye)  Some redness. Did flush with artifical tears This  was the eye that surgery was on, sees some out of eye. If problem persists see Dr. Blood sugar 112.  Theron Arista, RN [phone].

## 2016-10-28 DIAGNOSIS — I2583 Coronary atherosclerosis due to lipid rich plaque: Secondary | ICD-10-CM | POA: Insufficient documentation

## 2016-10-28 DIAGNOSIS — I2510 Atherosclerotic heart disease of native coronary artery without angina pectoris: Secondary | ICD-10-CM | POA: Insufficient documentation

## 2017-01-27 DIAGNOSIS — F1190 Opioid use, unspecified, uncomplicated: Secondary | ICD-10-CM | POA: Insufficient documentation

## 2017-09-15 DIAGNOSIS — H401113 Primary open-angle glaucoma, right eye, severe stage: Secondary | ICD-10-CM | POA: Insufficient documentation

## 2018-08-24 LAB — HM HEPATITIS C SCREENING LAB: HM Hepatitis Screen: NEGATIVE

## 2018-10-12 DIAGNOSIS — E119 Type 2 diabetes mellitus without complications: Secondary | ICD-10-CM | POA: Insufficient documentation

## 2018-10-12 DIAGNOSIS — E113553 Type 2 diabetes mellitus with stable proliferative diabetic retinopathy, bilateral: Secondary | ICD-10-CM | POA: Insufficient documentation

## 2018-10-12 DIAGNOSIS — Z794 Long term (current) use of insulin: Secondary | ICD-10-CM | POA: Insufficient documentation

## 2018-12-16 DIAGNOSIS — H1820 Unspecified corneal edema: Secondary | ICD-10-CM | POA: Insufficient documentation

## 2019-02-08 ENCOUNTER — Telehealth: Payer: Self-pay | Admitting: Family Medicine

## 2019-02-08 NOTE — Telephone Encounter (Signed)
Advised patient to try Miralax and Stool softener with plenty of fluids.

## 2019-02-09 ENCOUNTER — Other Ambulatory Visit: Payer: Self-pay

## 2019-02-09 ENCOUNTER — Emergency Department (HOSPITAL_COMMUNITY): Payer: Medicare Other

## 2019-02-09 ENCOUNTER — Emergency Department (HOSPITAL_COMMUNITY)
Admission: EM | Admit: 2019-02-09 | Discharge: 2019-02-09 | Disposition: A | Discharge status: Home / Self Care | Payer: Medicare Other | Attending: Emergency Medicine | Admitting: Emergency Medicine

## 2019-02-09 ENCOUNTER — Encounter (HOSPITAL_COMMUNITY): Payer: Self-pay | Admitting: Emergency Medicine

## 2019-02-09 DIAGNOSIS — N3000 Acute cystitis without hematuria: Secondary | ICD-10-CM | POA: Diagnosis not present

## 2019-02-09 DIAGNOSIS — Z79899 Other long term (current) drug therapy: Secondary | ICD-10-CM | POA: Diagnosis not present

## 2019-02-09 DIAGNOSIS — K5900 Constipation, unspecified: Secondary | ICD-10-CM | POA: Diagnosis not present

## 2019-02-09 DIAGNOSIS — E119 Type 2 diabetes mellitus without complications: Secondary | ICD-10-CM | POA: Diagnosis not present

## 2019-02-09 DIAGNOSIS — Z87891 Personal history of nicotine dependence: Secondary | ICD-10-CM | POA: Diagnosis not present

## 2019-02-09 DIAGNOSIS — I10 Essential (primary) hypertension: Secondary | ICD-10-CM | POA: Diagnosis not present

## 2019-02-09 DIAGNOSIS — Z794 Long term (current) use of insulin: Secondary | ICD-10-CM | POA: Insufficient documentation

## 2019-02-09 LAB — URINALYSIS, ROUTINE W REFLEX MICROSCOPIC
Bilirubin Urine: NEGATIVE
Glucose, UA: >=500 mg/dL — AB
Hgb urine dipstick: NEGATIVE
Ketones, ur: NEGATIVE mg/dL
Nitrite: NEGATIVE
Protein, ur: NEGATIVE mg/dL
Specific Gravity, Urine: 1.022 (ref 1.005–1.030)
pH: 5 (ref 5.0–8.0)

## 2019-02-09 LAB — COMPREHENSIVE METABOLIC PANEL
ALT: 20 U/L (ref 0–44)
AST: 21 U/L (ref 15–41)
Albumin: 3.7 g/dL (ref 3.5–5.0)
Alkaline Phosphatase: 59 U/L (ref 38–126)
Anion gap: 7 (ref 5–15)
BUN: 25 mg/dL — ABNORMAL HIGH (ref 8–23)
CO2: 19 mmol/L — ABNORMAL LOW (ref 22–32)
Calcium: 9 mg/dL (ref 8.9–10.3)
Chloride: 111 mmol/L (ref 98–111)
Creatinine, Ser: 1.14 mg/dL (ref 0.61–1.24)
GFR calc Af Amer: >60 mL/min (ref >60)
GFR calc non Af Amer: >60 mL/min (ref >60)
Glucose, Bld: 149 mg/dL — ABNORMAL HIGH (ref 70–99)
Potassium: 4.4 mmol/L (ref 3.5–5.1)
Sodium: 137 mmol/L (ref 135–145)
Total Bilirubin: 0.5 mg/dL (ref 0.3–1.2)
Total Protein: 7.6 g/dL (ref 6.5–8.1)

## 2019-02-09 LAB — CBC WITH DIFFERENTIAL/PLATELET
Abs Immature Granulocytes: 0.01 10*3/uL (ref 0.00–0.07)
Basophils Absolute: 0 10*3/uL (ref 0.0–0.1)
Basophils Relative: 1 %
Eosinophils Absolute: 0.2 10*3/uL (ref 0.0–0.5)
Eosinophils Relative: 3 %
HCT: 40.8 % (ref 39.0–52.0)
Hemoglobin: 12.1 g/dL — ABNORMAL LOW (ref 13.0–17.0)
Immature Granulocytes: 0 %
Lymphocytes Relative: 31 %
Lymphs Abs: 1.8 10*3/uL (ref 0.7–4.0)
MCH: 25.5 pg — ABNORMAL LOW (ref 26.0–34.0)
MCHC: 29.7 g/dL — ABNORMAL LOW (ref 30.0–36.0)
MCV: 85.9 fL (ref 80.0–100.0)
Monocytes Absolute: 0.5 10*3/uL (ref 0.1–1.0)
Monocytes Relative: 8 %
Neutro Abs: 3.3 10*3/uL (ref 1.7–7.7)
Neutrophils Relative %: 57 %
Platelets: 204 10*3/uL (ref 150–400)
RBC: 4.75 MIL/uL (ref 4.22–5.81)
RDW: 18.1 % — ABNORMAL HIGH (ref 11.5–15.5)
WBC: 5.9 10*3/uL (ref 4.0–10.5)
nRBC: 0 % (ref 0.0–0.2)

## 2019-02-09 LAB — POC OCCULT BLOOD, ED: Fecal Occult Bld: NEGATIVE

## 2019-02-09 LAB — LIPASE, BLOOD: Lipase: 21 U/L (ref 11–51)

## 2019-02-09 MED ORDER — BISACODYL 10 MG RE SUPP
10.0000 mg | Freq: Once | RECTAL | Status: AC
  Administered 2019-02-09: 10 mg via RECTAL
  Filled 2019-02-09: qty 1

## 2019-02-09 MED ORDER — CEPHALEXIN 500 MG PO CAPS: 500.0000 mg | ORAL_CAPSULE | Freq: Four times a day (QID) | ORAL | 0 refills | Status: DC

## 2019-02-09 NOTE — ED Provider Notes (Signed)
[facility] EMERGENCY DEPARTMENT Provider Note   CSN: [id number] Arrival date & time: 02/09/19  1100     History   Chief Complaint Chief Complaint  Patient presents with   Constipation    HPI [name] is a 68 y.o. male.     HPI   [name] is a 68 y.o. male who presents to the Emergency Department complaining of constipation for 5 days.  He states that he has tried MiraLAX and over-the-counter stool softeners without relief.  He describes having a fullness to his entire abdomen without significant pain.  He continues to have an appetite but endorses eating less in fear of worsening his constipation.  No fever, vomiting, or chills.  He also reports decreased urination recently without pain or burning, he describes it as a "weak stream".  No hematuria or flank pain, penile discharge or scrotal tenderness.  He has history of constipation in the past and current symptoms feel similar.  He denies any new medications.  No previous abdominal surgeries or history of SBO.   Past Medical History:  Diagnosis Date   Anxiety    Arthritis    Diabetes mellitus without complication (HCC)    GERD (gastroesophageal reflux disease)    Hypercholesteremia    Hypertension    Sleep apnea    Stop Bang score of 4    There are no active problems to display for this patient.   Past Surgical History:  Procedure Laterality Date   boils     removed form back of skull   CATARACT EXTRACTION W/PHACO Right 04/02/2013   Procedure: CATARACT EXTRACTION PHACO AND INTRAOCULAR LENS PLACEMENT (IOC);  Surgeon: Williams Che, MD;  Location: [facility] ORS;  Service: Ophthalmology;  Laterality: Right;  CDE 7.00   CATARACT EXTRACTION W/PHACO Left 10/04/2013   Procedure: CATARACT EXTRACTION PHACO AND INTRAOCULAR LENS PLACEMENT (IOC);  Surgeon: Williams Che, MD;  Location: [facility];  Service: Ophthalmology;  Laterality: Left;  CDE:1.46   COLONOSCOPY N/A 07/03/2015   Procedure: COLONOSCOPY;  Surgeon:  Rogene Houston, MD;  Location: [facility] SUITE;  Service: Endoscopy;  Laterality: N/A;  830   EYE SURGERY Right    "valve replaced and stent placed" per pt to reroute the fluid in his eye   PILONIDAL CYST / [place] Right       Home Medications    Prior to Admission medications   Medication Sig Start Date End Date Taking? Authorizing Provider  acetaZOLAMIDE (DIAMOX) 500 MG capsule Take 1 capsule by mouth 2 (two) times a day. 02/01/19  Yes [provider]  metFORMIN (GLUCOPHAGE-XR) 500 MG 24 hr tablet Take 2 tablets by mouth 2 (two) times a day. 06/30/17  Yes [provider]  ALPRAZolam Duanne Moron) 1 MG tablet Take 1 mg by mouth 3 (three) times daily as needed for anxiety.    [provider]  amLODipine (NORVASC) 5 MG tablet Take 5 mg by mouth daily.    [provider]  HYDROcodone-acetaminophen (NORCO) 7.5-325 MG per tablet Take 1 tablet by mouth 3 (three) times daily as needed for pain.    [provider]  insulin aspart (NOVOLOG) 100 UNIT/ML injection Inject 10 Units into the skin 3 (three) times daily with meals.    [provider]  Insulin Detemir (LEVEMIR FLEXPEN) 100 UNIT/ML Pen Inject 100 Units into the skin every morning.    [provider]  JARDIANCE 10 MG TABS tablet  Take 10 mg by mouth daily. 09/18/18   [provider]  latanoprost (XALATAN) 0.005 % ophthalmic solution Place 1 drop into both eyes at bedtime. 01/27/19   [provider]  lisinopril (PRINIVIL,ZESTRIL) 40 MG tablet Take 40 mg by mouth daily.    [provider]  omeprazole (PRILOSEC) 20 MG capsule Take 20 mg by mouth daily.    [provider]  rosuvastatin (CRESTOR) 20 MG tablet Take 20 mg by mouth daily.    [provider]  traMADol (ULTRAM) 50 MG tablet Take by mouth every 6 (six) hours as needed.    [provider]  Vitamin D, Ergocalciferol, (DRISDOL) 1.25 MG (50000 UT) CAPS  capsule Take 1 capsule by mouth once a week. 02/02/19   [provider]    Family History No family history on file.  Social History Social History   Tobacco Use   Smoking status: Former Smoker    Packs/day: 0.50    Years: 41.00    Pack years: 20.50    Types: Cigarettes    Quit date: 02/29/2012    Years since quitting: 6.9   Smokeless tobacco: Never Used  Substance Use Topics   Alcohol use: No    Comment: quit 2012   Drug use: No    Comment: quit in 2012     Allergies   Patient has no known allergies.   Review of Systems Review of Systems  Constitutional: Negative for fever.  Respiratory: Negative for chest tightness and shortness of breath.   Cardiovascular: Negative for chest pain.  Gastrointestinal: Positive for constipation. Negative for abdominal pain, nausea, rectal pain and vomiting.  Genitourinary: Positive for decreased urine volume. Negative for difficulty urinating, dysuria, flank pain and hematuria.  Musculoskeletal: Negative for arthralgias and back pain.  Neurological: Negative for dizziness, weakness and numbness.  Psychiatric/Behavioral: Negative for confusion.     Physical Exam Updated Vital Signs BP 127/72    Pulse 66    Temp 97.6 F (36.4 C) (Temporal)    Resp 18    Ht 6' (1.829 m)    Wt 129.3 kg    SpO2 95%    BMI 38.65 kg/m   Physical Exam Vitals signs and nursing note reviewed.  Constitutional:      General: He is not in acute distress.    Appearance: Normal appearance.  Cardiovascular:     Rate and Rhythm: Normal rate and regular rhythm.     Pulses: Normal pulses.  Pulmonary:     Effort: Pulmonary effort is normal.     Breath sounds: Normal breath sounds.  Chest:     Chest wall: No tenderness.  Abdominal:     General: Bowel sounds are normal.     Palpations: Abdomen is soft.     Tenderness: There is no abdominal tenderness. There is no right CVA tenderness, left CVA tenderness, guarding or rebound.  Genitourinary:     Prostate: Not tender.     Rectum: Guaiac result negative. No external hemorrhoid. Normal anal tone.     Comments: DRE performed with chaperone present.  Soft, brown heme negative stool present.  No palpable rectal masses or fecal impaction.  nml rectal tone Musculoskeletal: Normal range of motion.     Right lower leg: No edema.     Left lower leg: No edema.  Skin:    General: Skin is warm.     Capillary Refill: Capillary refill takes less than 2 seconds.  Neurological:     General: No  focal deficit present.     Mental Status: He is alert.      ED Treatments / Results  Labs (all labs ordered are listed, but only abnormal results are displayed) Labs Reviewed  COMPREHENSIVE METABOLIC PANEL - Abnormal; Notable for the following components:      Result Value   CO2 19 (*)    Glucose, Bld 149 (*)    BUN 25 (*)    All other components within normal limits  CBC WITH DIFFERENTIAL/PLATELET - Abnormal; Notable for the following components:   Hemoglobin 12.1 (*)    MCH 25.5 (*)    MCHC 29.7 (*)    RDW 18.1 (*)    All other components within normal limits  URINALYSIS, ROUTINE W REFLEX MICROSCOPIC - Abnormal; Notable for the following components:   Glucose, UA >=500 (*)    Leukocytes,Ua MODERATE (*)    Bacteria, UA RARE (*)    All other components within normal limits  URINE CULTURE  LIPASE, BLOOD  POC OCCULT BLOOD, ED    EKG None  Radiology Dg Abdomen Acute W/chest  Result Date: 02/09/2019 CLINICAL DATA:  Constipation EXAM: DG ABDOMEN ACUTE W/ 1V CHEST COMPARISON:  None. FINDINGS: PA chest: Lungs are clear. Heart size and pulmonary vascularity are normal. No adenopathy. Supine and upright abdomen: There is diffuse stool throughout the colon. There is no appreciable bowel dilatation or air-fluid level to suggest bowel obstruction. No free air. There are several small prostatic calculi. There are foci of degenerative change in the lumbar spine. IMPRESSION: Diffuse stool throughout  colon, a finding indicative of constipation. No bowel obstruction or free air. Lungs clear. Electronically Signed   By: Lowella Grip III M.D.   On: 02/09/2019 13:55    Procedures Procedures (including critical care time)  Medications Ordered in ED Medications  bisacodyl (DULCOLAX) suppository 10 mg (has no administration in time range)     Initial Impression / Assessment and Plan / ED Course  I have reviewed the triage vital signs and the nursing notes.  Pertinent labs & imaging results that were available during my care of the patient were reviewed by me and considered in my medical decision making (see chart for details).         Pt is well appearing.  Vitals reviewed.  Abdomen is soft and non-tender on exam. Heme negative, soft stool on DRE.   Labs reassuring,  BUN elevated which has also been elevated in the past, creatinine wnml, and XR shows likely constipation w/o concerning evidence for SBO  Will try suppository and reassess.  Pt also seen by Dr. Lacinda Axon and care plan discussed.   1555 on recheck, patient reports feeling better his symptoms have improved. He has also urinated w/o difficulty.   He has had a large BM here and states he is ready for discharge home.  I feel this is appropriate.  Advised him that he can try magnesium citrate at home or continue his MiraLAX 1-2 times daily until resolution of his constipation.    Urine culture pending.  U/A shows WBC's and moderate leukocytes.  Since pt is symptomatic, I will start abx and he agrees to close out pt f/u   Final Clinical Impressions(s) / ED Diagnoses   Final diagnoses:  Constipation, unspecified constipation type  Acute cystitis without hematuria    ED Discharge Orders    None       Kem Parkinson, PA-C 02/09/19 1651    Nat Christen, MD 02/10/19 1701

## 2019-02-09 NOTE — ED Triage Notes (Signed)
No BM x 5 days.  Has taken miralax and stool softness with no relief.

## 2019-02-09 NOTE — Discharge Instructions (Addendum)
Your x-ray today showed that you do have some constipation.  You can try taking over-the-counter magnesium citrate.  Drink one half of the bottle and wait 30-45 minutes.  If no significant improvement of your constipation you may drink the other half.  Also, your urinalysis today shows a possible urinary tract infection.  Take the antibiotic as directed until its finished.  Call your primary care provider to arrange a follow-up appointment.

## 2019-02-10 LAB — URINE CULTURE: Culture: 10000 — AB

## 2019-02-11 ENCOUNTER — Telehealth: Payer: Self-pay

## 2019-02-11 NOTE — Telephone Encounter (Signed)
Post ED Visit - Positive Culture Follow-up  Culture report reviewed by antimicrobial stewardship pharmacist: [place] Team []  Elenor Quinones, Pharm.D. []  Heide Guile, Pharm.D., BCPS AQ-ID []  Parks Neptune, Pharm.D., BCPS []  Alycia Rossetti, Pharm.D., BCPS []  [place], Pharm.D., BCPS, AAHIVP []  Legrand Como, Pharm.D., BCPS, AAHIVP []  Salome Arnt, PharmD, BCPS []  Johnnette Gourd, PharmD, BCPS [x]  Hughes Better, PharmD, BCPS []  Leeroy Cha, PharmD []  Laqueta Linden, PharmD, BCPS []  Albertina Parr, PharmD  [place] Team []  Leodis Sias, PharmD []  Lindell Spar, PharmD []  Royetta Asal, PharmD []  Graylin Shiver, Rph []  Rema Fendt) Glennon Mac, PharmD []  Arlyn Dunning, PharmD []  Netta Cedars, PharmD []  Dia Sitter, PharmD []  Leone Haven, PharmD []  Gretta Arab, PharmD []  Theodis Shove, PharmD []  Peggyann Juba, PharmD []  Reuel Boom, PharmD   Positive urine culture Treated with Cephalexin, organism sensitive to the same and no further patient follow-up is required at this time.  Genia Del 02/11/2019, 10:23 AM

## 2019-03-10 ENCOUNTER — Telehealth: Payer: Self-pay | Admitting: Family Medicine

## 2019-03-11 ENCOUNTER — Other Ambulatory Visit: Payer: Self-pay

## 2019-03-11 ENCOUNTER — Encounter: Payer: Self-pay | Admitting: Family Medicine

## 2019-03-11 ENCOUNTER — Ambulatory Visit (INDEPENDENT_AMBULATORY_CARE_PROVIDER_SITE_OTHER): Payer: Medicare Other | Admitting: Family Medicine

## 2019-03-11 VITALS — BP 98/57 | HR 73 | Temp 98.4°F | Ht 72.0 in | Wt 284.0 lb

## 2019-03-11 DIAGNOSIS — Z794 Long term (current) use of insulin: Secondary | ICD-10-CM

## 2019-03-11 DIAGNOSIS — E113553 Type 2 diabetes mellitus with stable proliferative diabetic retinopathy, bilateral: Secondary | ICD-10-CM

## 2019-03-11 DIAGNOSIS — I10 Essential (primary) hypertension: Secondary | ICD-10-CM

## 2019-03-11 DIAGNOSIS — E1159 Type 2 diabetes mellitus with other circulatory complications: Secondary | ICD-10-CM

## 2019-03-11 DIAGNOSIS — E1169 Type 2 diabetes mellitus with other specified complication: Secondary | ICD-10-CM

## 2019-03-11 DIAGNOSIS — K5903 Drug induced constipation: Secondary | ICD-10-CM

## 2019-03-11 DIAGNOSIS — I2510 Atherosclerotic heart disease of native coronary artery without angina pectoris: Secondary | ICD-10-CM

## 2019-03-11 DIAGNOSIS — Z7689 Persons encountering health services in other specified circumstances: Secondary | ICD-10-CM | POA: Diagnosis not present

## 2019-03-11 DIAGNOSIS — I2583 Coronary atherosclerosis due to lipid rich plaque: Secondary | ICD-10-CM

## 2019-03-11 DIAGNOSIS — I152 Hypertension secondary to endocrine disorders: Secondary | ICD-10-CM

## 2019-03-11 DIAGNOSIS — E785 Hyperlipidemia, unspecified: Secondary | ICD-10-CM

## 2019-03-11 MED ORDER — LACTULOSE 10 GM/15ML PO SOLN: 20.0000 g | Freq: Every day | ORAL | 0 refills | Status: DC | PRN

## 2019-03-11 NOTE — Patient Instructions (Signed)
Thank you for coming in to clinic today.  1. Your symptoms are consistent with Constipation, likely cause of your General Abdominal Pain / Cramping. 2. Start with Miralax, prescription was sent to pharmacy. First dose 68g (4 capfuls) in 32oz water over 1 to 2 hours for clean out. Next day start 17g or 1 capful daily, may adjust dose up or down by half a capful every few days. Recommend to take this medicine daily for next 1-2 weeks, you may need to use it longer if needed. - Goal is to have soft regular bowel movement 1-3x daily, if too runny or diarrhea, then reduce dose of the medicine to every other day.  Improve water intake, hydration will help Also recommend increased vegetables, fruits, fiber intake Can try daily Metamucil or Fiber supplement at pharmacy over the counter  Follow-up if symptoms are not improving with bowel movements, or if pain worsens, develop fevers, nausea, vomiting.  Please schedule a follow-up appointment with Michelle Rakes, FNP, in 1 month to follow-up Constipation  If you have any other questions or concerns, please feel free to call the clinic to contact me. You may also schedule an earlier appointment if necessary.  However, if your symptoms get significantly worse, please go to the Emergency Department to seek immediate medical attention.  

## 2019-03-11 NOTE — Progress Notes (Signed)
Subjective:  Patient ID: [name], male    DOB: Dec 28, 1950, 68 y.o.   MRN: [id number]  Patient Care Team: Baruch Gouty, FNP as PCP - General (Family Medicine)   Chief Complaint:  Establish Care ([place])   HPI: [name] is a 68 y.o. male presenting on 03/11/2019 for Establish Care ([place])   1. Encounter to establish care  Pt here to establish care. Was seeing Dr. Edrick Oh.    2. Hypertension associated with diabetes ([place])  Well controlled. No chest pain, shortness of breath, leg swelling, confusion, or headaches.    3. 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 No polydipsia, polyuria, or polydipsia. Complaint with medications. No associated side effects.    4. Coronary artery disease due to lipid rich plaque  Compliant with meds. No chest pain, shortness of breath, or leg swelling.   5. Hyperlipidemia associated with type 2 diabetes mellitus ([place])  Compliant with meds. No myalgias. Does not watch diet or exercise on a regular basis.    6. Constipation due to pain medication  No BM in 3 days. On hydrocodone for pain. Miralax is not effective.      Relevant past medical, surgical, family, and social history reviewed and updated as indicated.  Allergies and medications reviewed and updated. Date reviewed: Chart in Epic.   Past Medical History:  Diagnosis Date   Anxiety    Arthritis    Diabetes mellitus without complication (HCC)    GERD (gastroesophageal reflux disease)    Hypercholesteremia    Hypertension     Past Surgical History:  Procedure Laterality Date   boils     removed form back of skull   CATARACT EXTRACTION W/PHACO Right 04/02/2013   Procedure: CATARACT EXTRACTION PHACO AND INTRAOCULAR LENS PLACEMENT ([place]);  Surgeon: Williams Che, MD;  Location: [facility];  Service: Ophthalmology;  Laterality: Right;  CDE 7.00     CATARACT EXTRACTION W/PHACO Left 10/04/2013   Procedure: CATARACT EXTRACTION PHACO AND INTRAOCULAR LENS PLACEMENT ([facility]);  Surgeon: Williams Che, MD;  Location: [facility];  Service: Ophthalmology;  Laterality: Left;  CDE:1.46   COLONOSCOPY N/A 07/03/2015   Procedure: COLONOSCOPY;  Surgeon: Rogene Houston, MD;  Location: [facility];  Service: Endoscopy;  Laterality: N/A;  830   EYE SURGERY Right    "valve replaced and stent placed" per pt to reroute the fluid in his eye   ROTATOR CUFF REPAIR Right     Social History   Socioeconomic History   Marital status: Divorced    Spouse name: Not on file   Number of children: Not on file   Years of education: Not on file   Highest education level: Not on file  Occupational History   Not on file  Social Needs   Financial resource strain: Not on file   Food insecurity    Worry: Not on file    Inability: Not on file   Transportation needs    Medical: Not on file    Non-medical: Not on file  Tobacco Use   Smoking status: Former Smoker    Packs/day: 0.50    Years: 41.00    Pack years: 20.50    Types: Cigarettes    Quit date: 02/29/2012    Years since quitting: 7.0   Smokeless tobacco: Never Used  Substance and Sexual Activity   Alcohol  use: No    Comment: quit 2012   Drug use: No    Comment: quit in 2012   Sexual activity: Yes    Birth control/protection: None  Lifestyle   Physical activity    Days per week: Not on file    Minutes per session: Not on file   Stress: Not on file  Relationships   Social connections    Talks on phone: Not on file    Gets together: Not on file    Attends religious service: Not on file    Active member of club or organization: Not on file    Attends meetings of clubs or organizations: Not on file    Relationship status: Not on file   Intimate partner violence    Fear of current or ex partner: Not on file    Emotionally abused: Not on file    Physically abused: Not on file     Forced sexual activity: Not on file  Other Topics Concern   Not on file  Social History Narrative   Not on file    Outpatient Encounter Medications as of 03/11/2019  Medication Sig   acetaZOLAMIDE (DIAMOX) 500 MG capsule Take 1 capsule by mouth 2 (two) times a day.   ALPRAZolam (XANAX) 1 MG tablet Take 1 mg by mouth 3 (three) times daily as needed for anxiety.   amLODipine (NORVASC) 5 MG tablet Take 5 mg by mouth daily.   HYDROcodone-acetaminophen (NORCO) 7.5-325 MG per tablet Take 1 tablet by mouth 3 (three) times daily as needed for pain.   Insulin Detemir (LEVEMIR FLEXPEN) 100 UNIT/ML Pen Inject 100 Units into the skin every morning.   insulin lispro (HUMALOG KWIKPEN) 100 UNIT/ML KwikPen Inject 15 Units into the skin 3 (three) times daily.   JARDIANCE 10 MG TABS tablet Take 10 mg by mouth daily.   latanoprost (XALATAN) 0.005 % ophthalmic solution Place 1 drop into both eyes at bedtime.   lisinopril (PRINIVIL,ZESTRIL) 40 MG tablet Take 40 mg by mouth daily.   metFORMIN (GLUCOPHAGE-XR) 500 MG 24 hr tablet Take 1,000 mg by mouth 2 (two) times daily.   Vitamin D, Ergocalciferol, (DRISDOL) 1.25 MG (50000 UT) CAPS capsule Take 1 capsule by mouth once a week.   [DISCONTINUED] metFORMIN (GLUCOPHAGE-XR) 500 MG 24 hr tablet Take 2 tablets by mouth 2 (two) times a day.   lactulose (CHRONULAC) 10 GM/15ML solution Take 30 mLs (20 g total) by mouth daily as needed for up to 8 doses for mild constipation.   [DISCONTINUED] cephALEXin (KEFLEX) 500 MG capsule Take 1 capsule (500 mg total) by mouth 4 (four) times daily.   [DISCONTINUED] insulin aspart (NOVOLOG) 100 UNIT/ML injection Inject 10 Units into the skin 3 (three) times daily with meals.   [DISCONTINUED] omeprazole (PRILOSEC) 20 MG capsule Take 20 mg by mouth daily.   [DISCONTINUED] rosuvastatin (CRESTOR) 20 MG tablet Take 20 mg by mouth daily.   [DISCONTINUED] traMADol (ULTRAM) 50 MG tablet Take by mouth every 6 (six) hours  as needed.   No facility-administered encounter medications on file as of 03/11/2019.     No Known Allergies  Review of Systems  Constitutional: Negative for activity change, appetite change, chills, diaphoresis, fatigue, fever and unexpected weight change.  Eyes: Positive for visual disturbance (followed by [facility] and [facility]). Negative for photophobia.  Respiratory: Negative for cough, chest tightness and shortness of breath.   Cardiovascular: Negative for chest pain, palpitations and leg swelling.  Gastrointestinal: Positive for  constipation. Negative for abdominal distention, abdominal pain, anal bleeding, blood in stool, diarrhea, nausea, rectal pain and vomiting.  Endocrine: Negative for polydipsia, polyphagia and polyuria.  Genitourinary: Negative for decreased urine volume and difficulty urinating.  Musculoskeletal: Positive for arthralgias and back pain. Negative for myalgias.  Skin: Negative for color change and pallor.  Neurological: Negative for dizziness, tremors, seizures, syncope, facial asymmetry, speech difficulty, weakness, light-headedness, numbness and headaches.  Hematological: Does not bruise/bleed easily.  Psychiatric/Behavioral: Negative for confusion.  All other systems reviewed and are negative.       Objective:  BP (!) 98/57    Pulse 73    Temp 98.4 F (36.9 C)    Ht 6' (1.829 m)    Wt 284 lb (128.8 kg)    BMI 38.52 kg/m    Wt Readings from Last 3 Encounters:  03/11/19 284 lb (128.8 kg)  02/09/19 285 lb (129.3 kg)  07/03/15 289 lb (131.1 kg)    Physical Exam Vitals signs and nursing note reviewed.  Constitutional:      General: He is not in acute distress.    Appearance: Normal appearance. He is well-developed and well-groomed. He is obese. He is not ill-appearing, toxic-appearing or diaphoretic.  HENT:     Head: Normocephalic and atraumatic.     Jaw: There is normal jaw occlusion.     Right Ear: Hearing normal.      Left Ear: Hearing normal.     Nose: Nose normal.     Mouth/Throat:     Lips: Pink.     Mouth: Mucous membranes are moist.     Pharynx: Oropharynx is clear. Uvula midline.  Eyes:     General: Lids are normal.     Extraocular Movements: Extraocular movements intact.     Conjunctiva/sclera: Conjunctivae normal.     Pupils: Pupils are equal, round, and reactive to light.  Neck:     Musculoskeletal: Normal range of motion and neck supple.     Thyroid: No thyroid mass, thyromegaly or thyroid tenderness.     Vascular: No carotid bruit or JVD.     Trachea: Trachea and phonation normal.  Cardiovascular:     Rate and Rhythm: Normal rate and regular rhythm.     Chest Wall: PMI is not displaced.     Pulses: Normal pulses.     Heart sounds: Normal heart sounds. No murmur. No friction rub. No gallop.   Pulmonary:     Effort: Pulmonary effort is normal. No respiratory distress.     Breath sounds: Normal breath sounds. No wheezing.  Abdominal:     General: Bowel sounds are normal. There is no distension or abdominal bruit.     Palpations: Abdomen is soft. There is no hepatomegaly or splenomegaly.     Tenderness: There is no abdominal tenderness. There is no right CVA tenderness or left CVA tenderness.     Hernia: No hernia is present.  Musculoskeletal:     Right hip: Normal.     Left hip: Normal.     Thoracic back: Normal.     Lumbar back: He exhibits decreased range of motion. He exhibits no tenderness, no bony tenderness, no swelling, no edema, no deformity, no laceration, no pain, no spasm and normal pulse.     Right lower leg: No edema.     Left lower leg: No edema.  Lymphadenopathy:     Cervical: No cervical adenopathy.  Skin:    General: Skin is warm and dry.  Capillary Refill: Capillary refill takes less than 2 seconds.     Coloration: Skin is not cyanotic, jaundiced or pale.     Findings: No rash.  Neurological:     General: No focal deficit present.     Mental Status: He is  alert and oriented to person, place, and time.     Cranial Nerves: Cranial nerves are intact.     Sensory: Sensation is intact.     Motor: Motor function is intact.     Coordination: Coordination is intact.     Gait: Gait abnormal (antalgic gait, cane use).     Deep Tendon Reflexes: Reflexes are normal and symmetric.  Psychiatric:        Attention and Perception: Attention and perception normal.        Mood and Affect: Mood and affect normal.        Speech: Speech normal.        Behavior: Behavior normal. Behavior is cooperative.        Thought Content: Thought content normal.        Cognition and Memory: Cognition and memory normal.        Judgment: Judgment normal.     Results for orders placed or performed during the hospital encounter of 02/09/19  Urine culture   Specimen: Urine, Clean Catch  Result Value Ref Range   Specimen Description      URINE, CLEAN CATCH Performed at [facility], [address], [place], [place] [address]    Special Requests      NONE Performed at [facility], [address], [place], [place] [address]    Culture (A)     10,000 COLONIES/mL GROUP B STREP(S.AGALACTIAE)ISOLATED TESTING AGAINST S. AGALACTIAE NOT ROUTINELY PERFORMED DUE TO PREDICTABILITY OF AMP/PEN/VAN SUSCEPTIBILITY. Performed at [facility], [place] [address] [place],  [address]    Report Status 02/10/2019 FINAL   Comprehensive metabolic panel  Result Value Ref Range   Sodium 137 135 - 145 mmol/L   Potassium 4.4 3.5 - 5.1 mmol/L   Chloride 111 98 - 111 mmol/L   CO2 19 (L) 22 - 32 mmol/L   Glucose, Bld 149 (H) 70 - 99 mg/dL   BUN 25 (H) 8 - 23 mg/dL   Creatinine, Ser 1.14 0.61 - 1.24 mg/dL   Calcium 9.0 8.9 - 10.3 mg/dL   Total Protein 7.6 6.5 - 8.1 g/dL   Albumin 3.7 3.5 - 5.0 g/dL   AST 21 15 - 41 U/L   ALT 20 0 - 44 U/L   Alkaline Phosphatase 59 38 - 126 U/L   Total Bilirubin 0.5 0.3 - 1.2 mg/dL   GFR calc non Af Amer >60 >60 mL/min   GFR calc Af Amer >60 >60  mL/min   Anion gap 7 5 - 15  CBC with Differential  Result Value Ref Range   WBC 5.9 4.0 - 10.5 K/uL   RBC 4.75 4.22 - 5.81 MIL/uL   Hemoglobin 12.1 (L) 13.0 - 17.0 g/dL   HCT 40.8 39.0 - 52.0 %   MCV 85.9 80.0 - 100.0 fL   MCH 25.5 (L) 26.0 - 34.0 pg   MCHC 29.7 (L) 30.0 - 36.0 g/dL   RDW 18.1 (H) 11.5 - 15.5 %   Platelets 204 150 - 400 K/uL   nRBC 0.0 0.0 - 0.2 %   Neutrophils Relative % 57 %   Neutro Abs 3.3 1.7 - 7.7 K/uL   Lymphocytes Relative 31 %  Lymphs Abs 1.8 0.7 - 4.0 K/uL   Monocytes Relative 8 %   Monocytes Absolute 0.5 0.1 - 1.0 K/uL   Eosinophils Relative 3 %   Eosinophils Absolute 0.2 0.0 - 0.5 K/uL   Basophils Relative 1 %   Basophils Absolute 0.0 0.0 - 0.1 K/uL   Immature Granulocytes 0 %   Abs Immature Granulocytes 0.01 0.00 - 0.07 K/uL  Urinalysis, Routine w reflex microscopic  Result Value Ref Range   Color, Urine YELLOW YELLOW   APPearance CLEAR CLEAR   Specific Gravity, Urine 1.022 1.005 - 1.030   pH 5.0 5.0 - 8.0   Glucose, UA >=500 (A) NEGATIVE mg/dL   Hgb urine dipstick NEGATIVE NEGATIVE   Bilirubin Urine NEGATIVE NEGATIVE   Ketones, ur NEGATIVE NEGATIVE mg/dL   Protein, ur NEGATIVE NEGATIVE mg/dL   Nitrite NEGATIVE NEGATIVE   Leukocytes,Ua MODERATE (A) NEGATIVE   RBC / HPF 0-5 0 - 5 RBC/hpf   WBC, UA 11-20 0 - 5 WBC/hpf   Bacteria, UA RARE (A) NONE SEEN  Lipase, blood  Result Value Ref Range   Lipase 21 11 - 51 U/L  POC occult blood, ED  Result Value Ref Range   Fecal Occult Bld NEGATIVE NEGATIVE       Pertinent labs & imaging results that were available during my care of the patient were reviewed by me and considered in my medical decision making.  Assessment & Plan:  Lew was seen today for establish care.  Diagnoses and all orders for this visit:  Hypertension associated with diabetes ([place]) Well controlled. Diet and exercise encouraged. Labs pending.  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 Recently increased Levemir. Will adjust therapy if warranted.  -     CMP14+EGFR -     Thyroid Panel With TSH -     Lipid panel -     Bayer DCA Hb A1c Waived  Coronary artery disease due to lipid rich plaque Labs pending. Continue current medications.  -     CBC with Differential/Platelet -     Lipid panel  Hyperlipidemia associated with type 2 diabetes mellitus ([place]) Will check labs today. Continue current medications.  -     Lipid panel  Constipation due to pain medication Has failed miralax. Will try a few doses of lactulose. Report any new or worsening symptoms.  -     lactulose (CHRONULAC) 10 GM/15ML solution; Take 30 mLs (20 g total) by mouth daily as needed for up to 8 doses for mild constipation.     Continue all other maintenance medications.  Follow up plan: Return in about 18 days (around 03/29/2019), or if symptoms worsen or fail to improve, for Xanax.  Continue healthy lifestyle choices, including diet (rich in fruits, vegetables, and lean proteins, and low in salt and simple carbohydrates) and exercise (at least 30 minutes of moderate physical activity daily).  Educational handout given for constipation   The above assessment and management plan was discussed with the patient. The patient verbalized understanding of and has agreed to the management plan. Patient is aware to call the clinic if symptoms persist or worsen. Patient is aware when to return to the clinic for a follow-up visit. Patient educated on when it  is appropriate to go to the emergency department.   Monia Pouch, FNP-C [facility] [phone] 03/11/19

## 2019-03-26 ENCOUNTER — Other Ambulatory Visit: Payer: Self-pay

## 2019-03-26 ENCOUNTER — Telehealth: Payer: Self-pay | Admitting: Family Medicine

## 2019-03-29 ENCOUNTER — Ambulatory Visit: Payer: Medicare Other | Admitting: Family Medicine

## 2019-04-05 ENCOUNTER — Other Ambulatory Visit: Payer: Self-pay

## 2019-04-06 ENCOUNTER — Ambulatory Visit: Payer: Medicare Other | Admitting: Family Medicine

## 2019-04-06 ENCOUNTER — Ambulatory Visit (INDEPENDENT_AMBULATORY_CARE_PROVIDER_SITE_OTHER): Payer: Medicare Other | Admitting: Family Medicine

## 2019-04-06 ENCOUNTER — Encounter: Payer: Self-pay | Admitting: Family Medicine

## 2019-04-06 VITALS — BP 125/73 | HR 81 | Temp 99.0°F | Ht 72.0 in | Wt 277.0 lb

## 2019-04-06 DIAGNOSIS — K219 Gastro-esophageal reflux disease without esophagitis: Secondary | ICD-10-CM

## 2019-04-06 DIAGNOSIS — I152 Hypertension secondary to endocrine disorders: Secondary | ICD-10-CM

## 2019-04-06 DIAGNOSIS — I10 Essential (primary) hypertension: Secondary | ICD-10-CM

## 2019-04-06 DIAGNOSIS — E1159 Type 2 diabetes mellitus with other circulatory complications: Secondary | ICD-10-CM | POA: Diagnosis not present

## 2019-04-06 DIAGNOSIS — R7989 Other specified abnormal findings of blood chemistry: Secondary | ICD-10-CM

## 2019-04-06 DIAGNOSIS — K5903 Drug induced constipation: Secondary | ICD-10-CM | POA: Diagnosis not present

## 2019-04-06 DIAGNOSIS — E559 Vitamin D deficiency, unspecified: Secondary | ICD-10-CM | POA: Diagnosis not present

## 2019-04-06 DIAGNOSIS — F411 Generalized anxiety disorder: Secondary | ICD-10-CM

## 2019-04-06 DIAGNOSIS — Z794 Long term (current) use of insulin: Secondary | ICD-10-CM

## 2019-04-06 DIAGNOSIS — E113553 Type 2 diabetes mellitus with stable proliferative diabetic retinopathy, bilateral: Secondary | ICD-10-CM | POA: Diagnosis not present

## 2019-04-06 DIAGNOSIS — Z79899 Other long term (current) drug therapy: Secondary | ICD-10-CM

## 2019-04-06 LAB — BAYER DCA HB A1C WAIVED: HB A1C (BAYER DCA - WAIVED): 8 % — ABNORMAL HIGH (ref <7.0)

## 2019-04-06 MED ORDER — EMPAGLIFLOZIN 25 MG PO TABS: 25.0000 mg | ORAL_TABLET | Freq: Every day | ORAL | 0 refills | Status: DC

## 2019-04-06 MED ORDER — LACTULOSE 10 GM/15ML PO SOLN: 20.0000 g | Freq: Every day | ORAL | 0 refills | Status: DC | PRN

## 2019-04-06 MED ORDER — ALPRAZOLAM 1 MG PO TABS: 1.0000 mg | ORAL_TABLET | Freq: Three times a day (TID) | ORAL | 3 refills | Status: AC | PRN

## 2019-04-06 NOTE — Patient Instructions (Signed)

## 2019-04-06 NOTE — Progress Notes (Signed)
Subjective:  Patient ID: [name], male    DOB: 12-03-1950, 67 y.o.   MRN: [id number]  Patient Care Team: Baruch Gouty, FNP as PCP - General (Family Medicine)   Chief Complaint:  Medical Management of Chronic Issues and Medication Refill   HPI: [name] is a 68 y.o. male presenting on 04/06/2019 for Medical Management of Chronic Issues and Medication Refill  1. 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 male who presents for follow up evaluation of Type 2 diabetes mellitus.  Current symptoms include visual disturbances and have been stable. Symptoms have been present for several months. Patient denies foot ulcerations, hyperglycemia, hypoglycemia , increased appetite, nausea, polydipsia, polyuria, vomiting and weight loss.  Current diabetic medications include Jardiance, Levemir, Humalog, Metformin.  Compliant with meds - Yes  Current monitoring regimen: home blood tests - several times daily Home blood sugar records: fasting range: 105-120 and postprandial range: 130-180 Any episodes of hypoglycemia? no  Known diabetic complications: retinopathy, impotence and cardiovascular disease Cardiovascular risk factors: advanced age (older than 60 for men, 39 for women), diabetes mellitus, dyslipidemia, family history of premature cardiovascular disease, hypertension, male gender, obesity (BMI >= 30 kg/m2) and sedentary lifestyle Eye exam current (within one year): yes Podiatry yearly?  No Weight trend: stable Prior visit with CDE: yes Current diet: well balanced Current exercise: walking  PNA Vaccine UTD?  Yes Hep B Vaccine?  Yes Tdap Vaccine UTD?  Yes  Is He on ACE inhibitor or angiotensin II receptor blocker?  Yes  lisinopril (Prinivil) Is He on statin? No Is He on ASA 81 mg daily?  No  Lab Review Glucose (mg/dL)  Date Value  04/06/2019 140 (H)   Glucose, Bld  (mg/dL)  Date Value  02/09/2019 149 (H)  09/29/2013 160 (H)  03/31/2013 141 (H)   CO2  Date Value  04/06/2019 17 mmol/L (L)  02/09/2019 19 mmol/L (L)  09/29/2013 26 mEq/L   BUN (mg/dL)  Date Value  04/06/2019 16  02/09/2019 25 (H)  09/29/2013 13  03/31/2013 25 (H)   Creatinine, Ser (mg/dL)  Date Value  04/06/2019 0.97  02/09/2019 1.14  09/29/2013 0.82   Lab Results  Component Value Date   HGBA1C 8.0 (H) 04/06/2019   Urine Microalbumin/Creat Ratio: 26.9 on 04/23/2018   2. Constipation due to pain medication  Lactulose as needed for constipation. Usually takes 2 times per week.    3. Hypertension associated with diabetes ([place])  Complaint with meds - Yes Current Medications - Norvasc, Lisinopril  Checking BP at home - No Exercising Regularly - No Watching Salt intake - Yes Pertinent ROS:  Headache - No Fatigue - No Visual Disturbances - Yes Chest pain - No Dyspnea - No Palpitations - No LE edema - No They report good compliance with medications and can restate their regimen by memory. No medication side effects.  Family, social, and smoking history reviewed.   BP Readings from Last 3 Encounters:  04/06/19 125/73  03/11/19 (!) 98/57  02/09/19 127/72   CMP Latest Ref Rng & Units 04/06/2019 02/09/2019 09/29/2013  Glucose 65 - 99 mg/dL 140(H) 149(H) 160(H)  BUN 8 - 27 mg/dL 16 25(H) 13  Creatinine 0.76 - 1.27 mg/dL 0.97 1.14 0.82  Sodium 134 - 144 mmol/L 141 137 142  Potassium 3.5 - 5.2 mmol/L 4.8 4.4 4.3  Chloride 96 -  106 mmol/L 109(H) 111 104  CO2 20 - 29 mmol/L 17(L) 19(L) 26  Calcium 8.6 - 10.2 mg/dL 9.6 9.0 9.4  Total Protein 6.0 - 8.5 g/dL 8.1 7.6 -  Total Bilirubin 0.0 - 1.2 mg/dL 0.3 0.5 -  Alkaline Phos 39 - 117 IU/L 72 59 -  AST 0 - 40 IU/L 17 21 -  ALT 0 - 44 IU/L 12 20 -      4. Vitamin D deficiency  Pt is taking oral repletion therapy. Denies bone pain and tenderness, muscle weakness, fracture, and difficulty walking. Lab Results    Component Value Date   VD25OH 49.1 04/06/2019   Lab Results  Component Value Date   CALCIUM 9.6 04/06/2019      5. Gastroesophageal reflux disease without esophagitis  Diet controlled. No sore throat, weight loss, fever, fatigue, chills, diaphoresis, hemoptysis, voice change, cough, dysphagia, melena, or hematochezia.    6. Morbid obesity ([place])  Does try to watch diet. Does not exercise on a regular basis.    7. GAD (generalized anxiety disorder)   8. Controlled substance agreement signed  Ongoing anxiety for years. Has been on Xanax for a long time. States he has tried other therapies and this is all that has worked for him. Pt states he takes the Xanax three times daily everyday. No associated side effects. Has never tried to cut back on therapy.  Pt aware he will have to sign a controlled substance agreement and comply with the agreement to get controlled substances at [facility]. Pt agrees to terms of the controlled substance agreement.   GAD 7 : Generalized Anxiety Score 04/06/2019  Nervous, Anxious, on Edge 2  Control/stop worrying 1  Worry too much - different things 2  Trouble relaxing 1  Restless 0  Easily annoyed or irritable 1  Afraid - awful might happen 1  Total GAD 7 Score 8         Relevant past medical, surgical, family, and social history reviewed and updated as indicated.  Allergies and medications reviewed and updated. Date reviewed: Chart in Epic.   Past Medical History:  Diagnosis Date   Anxiety    Arthritis    Diabetes mellitus without complication (HCC)    GERD (gastroesophageal reflux disease)    Hypercholesteremia    Hypertension     Past Surgical History:  Procedure Laterality Date   boils     removed form back of skull   CATARACT EXTRACTION W/PHACO Right 04/02/2013   Procedure: CATARACT EXTRACTION PHACO AND INTRAOCULAR LENS PLACEMENT ([place]);  Surgeon: Williams Che, MD;  Location: [facility];  Service: Ophthalmology;  Laterality: Right;   CDE 7.00   CATARACT EXTRACTION W/PHACO Left 10/04/2013   Procedure: CATARACT EXTRACTION PHACO AND INTRAOCULAR LENS PLACEMENT ([facility]);  Surgeon: Williams Che, MD;  Location: [facility];  Service: Ophthalmology;  Laterality: Left;  CDE:1.46   COLONOSCOPY N/A 07/03/2015   Procedure: COLONOSCOPY;  Surgeon: Rogene Houston, MD;  Location: [facility];  Service: Endoscopy;  Laterality: N/A;  830   EYE SURGERY Right    "valve replaced and stent placed" per pt to reroute the fluid in his eye   ROTATOR CUFF REPAIR Right     Social History   Socioeconomic History   Marital status: Divorced    Spouse name: Not on file   Number of children: Not on file   Years of education: Not on file   Highest education level: Not on file  Occupational History  Not on file  Social Needs   Financial resource strain: Not on file   Food insecurity    Worry: Not on file    Inability: Not on file   Transportation needs    Medical: Not on file    Non-medical: Not on file  Tobacco Use   Smoking status: Former Smoker    Packs/day: 0.50    Years: 41.00    Pack years: 20.50    Types: Cigarettes    Quit date: 02/29/2012    Years since quitting: 7.1   Smokeless tobacco: Never Used  Substance and Sexual Activity   Alcohol use: No    Comment: quit 2012   Drug use: No    Comment: quit in 2012   Sexual activity: Yes    Birth control/protection: None  Lifestyle   Physical activity    Days per week: Not on file    Minutes per session: Not on file   Stress: Not on file  Relationships   Social connections    Talks on phone: Not on file    Gets together: Not on file    Attends religious service: Not on file    Active member of club or organization: Not on file    Attends meetings of clubs or organizations: Not on file    Relationship status: Not on file   Intimate partner violence    Fear of current or ex partner: Not on file    Emotionally abused: Not on file    Physically abused: Not  on file    Forced sexual activity: Not on file  Other Topics Concern   Not on file  Social History Narrative   Not on file    Outpatient Encounter Medications as of 04/06/2019  Medication Sig   acetaZOLAMIDE (DIAMOX) 500 MG capsule Take 1 capsule by mouth 2 (two) times a day.   amLODipine (NORVASC) 5 MG tablet Take 5 mg by mouth daily.   Insulin Detemir (LEVEMIR FLEXPEN) 100 UNIT/ML Pen Inject 100 Units into the skin every morning.   insulin lispro (HUMALOG KWIKPEN) 100 UNIT/ML KwikPen Inject 15 Units into the skin 3 (three) times daily.   lactulose (CHRONULAC) 10 GM/15ML solution Take 30 mLs (20 g total) by mouth daily as needed for up to 8 doses for mild constipation.   latanoprost (XALATAN) 0.005 % ophthalmic solution Place 1 drop into both eyes at bedtime.   lisinopril (PRINIVIL,ZESTRIL) 40 MG tablet Take 40 mg by mouth daily.   metFORMIN (GLUCOPHAGE-XR) 500 MG 24 hr tablet Take 1,000 mg by mouth 2 (two) times daily.   Vitamin D, Ergocalciferol, (DRISDOL) 1.25 MG (50000 UT) CAPS capsule Take 1 capsule by mouth once a week.   [DISCONTINUED] JARDIANCE 10 MG TABS tablet Take 10 mg by mouth daily.   [DISCONTINUED] lactulose (CHRONULAC) 10 GM/15ML solution Take 30 mLs (20 g total) by mouth daily as needed for up to 8 doses for mild constipation.   ALPRAZolam (XANAX) 1 MG tablet Take 1 tablet (1 mg total) by mouth 3 (three) times daily as needed for anxiety.   empagliflozin (JARDIANCE) 25 MG TABS tablet Take 25 mg by mouth daily before breakfast.   [DISCONTINUED] ALPRAZolam (XANAX) 1 MG tablet Take 1 mg by mouth 3 (three) times daily as needed for anxiety.   [DISCONTINUED] HYDROcodone-acetaminophen (NORCO) 7.5-325 MG per tablet Take 1 tablet by mouth 3 (three) times daily as needed for pain.   No facility-administered encounter medications on file as of 04/06/2019.  No Known Allergies  Review of Systems  Constitutional: Negative for activity change, appetite change,  chills, diaphoresis, fatigue, fever and unexpected weight change.  HENT: Negative.   Eyes: Positive for visual disturbance. Negative for photophobia.  Respiratory: Negative for cough, chest tightness and shortness of breath.   Cardiovascular: Negative for chest pain, palpitations and leg swelling.  Gastrointestinal: Positive for constipation. Negative for abdominal distention, abdominal pain, blood in stool, diarrhea, nausea and vomiting.  Endocrine: Negative.  Negative for polydipsia, polyphagia and polyuria.  Genitourinary: Negative for decreased urine volume, difficulty urinating, dysuria, frequency and urgency.  Musculoskeletal: Positive for arthralgias, back pain and gait problem (uses cane). Negative for myalgias.  Skin: Negative.   Allergic/Immunologic: Negative.   Neurological: Negative for dizziness, tremors, seizures, syncope, facial asymmetry, speech difficulty, weakness, light-headedness, numbness and headaches.  Hematological: Negative.   Psychiatric/Behavioral: Positive for agitation, decreased concentration and dysphoric mood. Negative for behavioral problems, confusion, hallucinations, self-injury, sleep disturbance and suicidal ideas. The patient is nervous/anxious. The patient is not hyperactive.   All other systems reviewed and are negative.       Objective:  BP 125/73    Pulse 81    Temp 99 F (37.2 C)    Ht 6' (1.829 m)    Wt 277 lb (125.6 kg)    BMI 37.57 kg/m    Wt Readings from Last 3 Encounters:  04/06/19 277 lb (125.6 kg)  03/11/19 284 lb (128.8 kg)  02/09/19 285 lb (129.3 kg)    Physical Exam Vitals signs and nursing note reviewed.  Constitutional:      General: He is not in acute distress.    Appearance: Normal appearance. He is well-developed and well-groomed. He is not ill-appearing, toxic-appearing or diaphoretic.  HENT:     Head: Normocephalic and atraumatic.     Jaw: There is normal jaw occlusion.     Right Ear: Hearing normal.     Left Ear:  Hearing normal.     Nose: Nose normal.     Mouth/Throat:     Lips: Pink.     Mouth: Mucous membranes are moist.     Pharynx: Oropharynx is clear. Uvula midline.  Eyes:     General: Lids are normal.     Extraocular Movements: Extraocular movements intact.     Conjunctiva/sclera: Conjunctivae normal.     Pupils: Pupils are equal, round, and reactive to light.  Neck:     Musculoskeletal: Normal range of motion and neck supple.     Thyroid: No thyroid mass, thyromegaly or thyroid tenderness.     Vascular: No carotid bruit or JVD.     Trachea: Trachea and phonation normal.  Cardiovascular:     Rate and Rhythm: Normal rate and regular rhythm.     Chest Wall: PMI is not displaced.     Pulses: Normal pulses.     Heart sounds: Normal heart sounds. No murmur. No friction rub. No gallop.   Pulmonary:     Effort: Pulmonary effort is normal. No respiratory distress.     Breath sounds: Normal breath sounds. No wheezing.  Abdominal:     General: Bowel sounds are normal. There is no distension or abdominal bruit.     Palpations: Abdomen is soft. There is no hepatomegaly or splenomegaly.     Tenderness: There is no abdominal tenderness. There is no right CVA tenderness or left CVA tenderness.     Hernia: No hernia is present.  Musculoskeletal:     Right lower leg: No  edema.     Left lower leg: No edema.  Lymphadenopathy:     Cervical: No cervical adenopathy.  Skin:    General: Skin is warm and dry.     Capillary Refill: Capillary refill takes less than 2 seconds.     Coloration: Skin is not cyanotic, jaundiced or pale.     Findings: No rash.  Neurological:     General: No focal deficit present.     Mental Status: He is alert and oriented to person, place, and time.     Cranial Nerves: Cranial nerves are intact.     Sensory: Sensation is intact.     Motor: Motor function is intact.     Coordination: Coordination is intact.     Gait: Gait abnormal (antalgic gait, uses cane).     Deep  Tendon Reflexes: Reflexes are normal and symmetric.  Psychiatric:        Attention and Perception: Attention and perception normal.        Mood and Affect: Mood and affect normal.        Speech: Speech normal.        Behavior: Behavior normal. Behavior is cooperative.        Thought Content: Thought content normal.        Cognition and Memory: Cognition and memory normal.        Judgment: Judgment normal.     Results for orders placed or performed in visit on 04/06/19  CMP14+EGFR  Result Value Ref Range   Glucose 140 (H) 65 - 99 mg/dL   BUN 16 8 - 27 mg/dL   Creatinine, Ser 0.97 0.76 - 1.27 mg/dL   GFR calc non Af Amer 80 >59 mL/min/1.73   GFR calc Af Amer 92 >59 mL/min/1.73   BUN/Creatinine Ratio 16 10 - 24   Sodium 141 134 - 144 mmol/L   Potassium 4.8 3.5 - 5.2 mmol/L   Chloride 109 (H) 96 - 106 mmol/L   CO2 17 (L) 20 - 29 mmol/L   Calcium 9.6 8.6 - 10.2 mg/dL   Total Protein 8.1 6.0 - 8.5 g/dL   Albumin 4.5 3.8 - 4.8 g/dL   Globulin, Total 3.6 1.5 - 4.5 g/dL   Albumin/Globulin Ratio 1.3 1.2 - 2.2   Bilirubin Total 0.3 0.0 - 1.2 mg/dL   Alkaline Phosphatase 72 39 - 117 IU/L   AST 17 0 - 40 IU/L   ALT 12 0 - 44 IU/L  CBC with Differential/Platelet  Result Value Ref Range   WBC 6.8 3.4 - 10.8 x10E3/uL   RBC 5.21 4.14 - 5.80 x10E6/uL   Hemoglobin 13.4 13.0 - 17.7 g/dL   Hematocrit 42.8 37.5 - 51.0 %   MCV 82 79 - 97 fL   MCH 25.7 (L) 26.6 - 33.0 pg   MCHC 31.3 (L) 31.5 - 35.7 g/dL   RDW 16.9 (H) 11.6 - 15.4 %   Platelets 259 150 - 450 x10E3/uL   Neutrophils 63 Not Estab. %   Lymphs 28 Not Estab. %   Monocytes 7 Not Estab. %   Eos 1 Not Estab. %   Basos 1 Not Estab. %   Neutrophils Absolute 4.3 1.4 - 7.0 x10E3/uL   Lymphocytes Absolute 1.9 0.7 - 3.1 x10E3/uL   Monocytes Absolute 0.4 0.1 - 0.9 x10E3/uL   EOS (ABSOLUTE) 0.1 0.0 - 0.4 x10E3/uL   Basophils Absolute 0.0 0.0 - 0.2 x10E3/uL   Immature Granulocytes 0 Not Estab. %   Immature Grans (Abs) 0.0  0.0 - 0.1  x10E3/uL  Lipid panel  Result Value Ref Range   Cholesterol, Total 124 100 - 199 mg/dL   Triglycerides 122 0 - 149 mg/dL   HDL 33 (L) >39 mg/dL   VLDL Cholesterol Cal 22 5 - 40 mg/dL   LDL Chol Calc (NIH) 69 0 - 99 mg/dL   Lipid Comment: CANCELED    Chol/HDL Ratio 3.8 0.0 - 5.0 ratio  Thyroid Panel With TSH  Result Value Ref Range   TSH 0.059 (L) 0.450 - 4.500 uIU/mL   T4, Total 6.8 4.5 - 12.0 ug/dL   T3 Uptake Ratio 27 24 - 39 %   Free Thyroxine Index 1.8 1.2 - 4.9  VITAMIN D 25 Hydroxy (Vit-D Deficiency, Fractures)  Result Value Ref Range   Vit D, 25-Hydroxy 49.1 30.0 - 100.0 ng/mL  Bayer DCA Hb A1c Waived  Result Value Ref Range   HB A1C (BAYER DCA - WAIVED) 8.0 (H) <7.0 %       Pertinent labs & imaging results that were available during my care of the patient were reviewed by me and considered in my medical decision making.  Assessment & Plan:  Ugochukwu was seen today for medical management of chronic issues and medication refill.  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   1.  Rx changes: increased Jardiance to 25 mg daily 2.  Education: Reviewed ABCs of diabetes management (respective goals in parentheses):  A1C (<7), blood pressure (<130/80), BMI (<25), and cholesterol (LDL <100). 3.  Discussed pathophysiology of DM; difference between type 1 and type 2 DM. 4.  CHO counting diet discussed.  Reviewed CHO amount in various foods and how to read nutrition  labels.  Discussed recommended serving sizes.  5.  Recommend check BG several times a week and record readings to bring to next appointment. Report persistent high or low readings. 6.  Recommended increase physical activity - goal is 150 minutes per week and advance as tolerated. 7.  Adequate sleep, at least 6-8 hours per night.  8.  Smoking cessation.  9.  Follow up: 3 months  -     CMP14+EGFR -     Lipid panel -     Bayer DCA Hb A1c Waived -     empagliflozin (JARDIANCE) 25 MG TABS tablet; Take 25 mg by mouth daily before breakfast.  Constipation due to pain medication Lactulose working well for constipation. Will continue.  -     lactulose (CHRONULAC) 10 GM/15ML solution; Take 30 mLs (20 g total) by mouth daily as needed for up to 8 doses for mild constipation.  Hypertension associated with diabetes ([place]) BP well controlled. Changes were not made in regimen today. Daily blood pressure log given with instructions on how to fill out and told to bring to next visit. [place] BP 130/80. Pt aware to report any persistent high or low readings. DASH diet and exercise encouraged. Exercise at least 150 minutes per week and increase as tolerated. Goal BMI > 25. Stress management encouraged.  Smoking cessation discussed. Avoid excessive alcohol. Avoid NSAID's. Avoid more than 2000 mg of sodium daily. Medications as prescribed. Follow up as scheduled.  -     CMP14+EGFR -     CBC with Differential/Platelet -     Lipid panel -     Thyroid Panel With TSH  Vitamin D deficiency Labs pending. Continue repletion therapy. If indicated, will change repletion dosage. Eat foods rich in Vit D including milk, orange juice, yogurt with vitamin D added, salmon or mackerel, canned tuna fish, cereals with vitamin D added, and cod liver oil. Get out in the sun but make sure to wear at least SPF 30 sunscreen.  -     VITAMIN D 25 Hydroxy (Vit-D Deficiency, Fractures)  Gastroesophageal reflux disease without  esophagitis Diet controlled. Will check CBC today. No red flags present.  -     CBC with Differential/Platelet  Morbid obesity (HCC) Diet and exercise encouraged.  -     CMP14+EGFR -     CBC with Differential/Platelet -     Lipid panel -     Thyroid Panel With TSH  GAD (generalized anxiety disorder) Controlled substance agreement signed Controlled substance agreement signed. Toxassure collected today. Xanax refilled. Pt to try taking 0.5 mg twice daily and 1 mg at bedtime. Pt aware we have to try to taper down on the dose.  -     ALPRAZolam (XANAX) 1 MG tablet; Take 1 tablet (1 mg total) by mouth 3 (three) times daily as needed for anxiety. -     ToxASSURE Select 13 ([place]), Urine    Continue all other maintenance medications.  Follow up plan: Return in about 3 months (around 07/06/2019), or if symptoms worsen or fail to improve.  Continue healthy lifestyle choices, including diet (rich in fruits, vegetables, and lean proteins, and low in salt and simple carbohydrates) and exercise (at least 30 minutes of moderate physical activity daily).  Educational handout given for DM  The above assessment and management plan was discussed with the patient. The patient verbalized understanding of and has agreed to the management plan. Patient is aware to call the clinic if symptoms persist or worsen. Patient is aware when to return to the clinic for a follow-up visit. Patient educated on when it is appropriate to go to the emergency department.   Monia Pouch, FNP-C [facility] [phone]

## 2019-04-07 LAB — CBC WITH DIFFERENTIAL/PLATELET
Basophils Absolute: 0 10*3/uL (ref 0.0–0.2)
Basos: 1 %
EOS (ABSOLUTE): 0.1 10*3/uL (ref 0.0–0.4)
Eos: 1 %
Hematocrit: 42.8 % (ref 37.5–51.0)
Hemoglobin: 13.4 g/dL (ref 13.0–17.7)
Immature Grans (Abs): 0 10*3/uL (ref 0.0–0.1)
Immature Granulocytes: 0 %
Lymphocytes Absolute: 1.9 10*3/uL (ref 0.7–3.1)
Lymphs: 28 %
MCH: 25.7 pg — ABNORMAL LOW (ref 26.6–33.0)
MCHC: 31.3 g/dL — ABNORMAL LOW (ref 31.5–35.7)
MCV: 82 fL (ref 79–97)
Monocytes Absolute: 0.4 10*3/uL (ref 0.1–0.9)
Monocytes: 7 %
Neutrophils Absolute: 4.3 10*3/uL (ref 1.4–7.0)
Neutrophils: 63 %
Platelets: 259 10*3/uL (ref 150–450)
RBC: 5.21 x10E6/uL (ref 4.14–5.80)
RDW: 16.9 % — ABNORMAL HIGH (ref 11.6–15.4)
WBC: 6.8 10*3/uL (ref 3.4–10.8)

## 2019-04-07 LAB — CMP14+EGFR
ALT: 12 IU/L (ref 0–44)
AST: 17 IU/L (ref 0–40)
Albumin/Globulin Ratio: 1.3 (ref 1.2–2.2)
Albumin: 4.5 g/dL (ref 3.8–4.8)
Alkaline Phosphatase: 72 IU/L (ref 39–117)
BUN/Creatinine Ratio: 16 (ref 10–24)
BUN: 16 mg/dL (ref 8–27)
Bilirubin Total: 0.3 mg/dL (ref 0.0–1.2)
CO2: 17 mmol/L — ABNORMAL LOW (ref 20–29)
Calcium: 9.6 mg/dL (ref 8.6–10.2)
Chloride: 109 mmol/L — ABNORMAL HIGH (ref 96–106)
Creatinine, Ser: 0.97 mg/dL (ref 0.76–1.27)
GFR calc Af Amer: 92 mL/min/{1.73_m2} (ref >59)
GFR calc non Af Amer: 80 mL/min/{1.73_m2} (ref >59)
Globulin, Total: 3.6 g/dL (ref 1.5–4.5)
Glucose: 140 mg/dL — ABNORMAL HIGH (ref 65–99)
Potassium: 4.8 mmol/L (ref 3.5–5.2)
Sodium: 141 mmol/L (ref 134–144)
Total Protein: 8.1 g/dL (ref 6.0–8.5)

## 2019-04-07 LAB — LIPID PANEL
Chol/HDL Ratio: 3.8 ratio (ref 0.0–5.0)
Cholesterol, Total: 124 mg/dL (ref 100–199)
HDL: 33 mg/dL — ABNORMAL LOW (ref >39)
LDL Chol Calc (NIH): 69 mg/dL (ref 0–99)
Triglycerides: 122 mg/dL (ref 0–149)
VLDL Cholesterol Cal: 22 mg/dL (ref 5–40)

## 2019-04-07 LAB — THYROID PANEL WITH TSH
Free Thyroxine Index: 1.8 (ref 1.2–4.9)
T3 Uptake Ratio: 27 % (ref 24–39)
T4, Total: 6.8 ug/dL (ref 4.5–12.0)
TSH: 0.059 u[IU]/mL — ABNORMAL LOW (ref 0.450–4.500)

## 2019-04-07 LAB — VITAMIN D 25 HYDROXY (VIT D DEFICIENCY, FRACTURES): Vit D, 25-Hydroxy: 49.1 ng/mL (ref 30.0–100.0)

## 2019-04-08 LAB — TOXASSURE SELECT 13 (MW), URINE

## 2019-04-09 ENCOUNTER — Ambulatory Visit: Payer: Self-pay | Admitting: Family Medicine

## 2019-04-14 ENCOUNTER — Encounter: Payer: Self-pay | Admitting: *Deleted

## 2019-04-20 LAB — HM DIABETES EYE EXAM

## 2019-04-28 ENCOUNTER — Other Ambulatory Visit: Payer: Self-pay

## 2019-04-28 ENCOUNTER — Other Ambulatory Visit: Payer: Medicare Other

## 2019-04-28 DIAGNOSIS — R7989 Other specified abnormal findings of blood chemistry: Secondary | ICD-10-CM

## 2019-04-29 LAB — THYROID ANTIBODIES
Thyroglobulin Antibody: <1 IU/mL (ref 0.0–0.9)
Thyroperoxidase Ab SerPl-aCnc: 11 IU/mL (ref 0–34)

## 2019-04-29 LAB — THYROID PANEL WITH TSH
Free Thyroxine Index: 1.3 (ref 1.2–4.9)
T3 Uptake Ratio: 23 % — ABNORMAL LOW (ref 24–39)
T4, Total: 5.8 ug/dL (ref 4.5–12.0)
TSH: 0.124 u[IU]/mL — ABNORMAL LOW (ref 0.450–4.500)

## 2019-05-05 ENCOUNTER — Other Ambulatory Visit: Payer: Self-pay | Admitting: Family Medicine

## 2019-05-05 DIAGNOSIS — K5903 Drug induced constipation: Secondary | ICD-10-CM

## 2019-06-09 ENCOUNTER — Other Ambulatory Visit: Payer: Self-pay | Admitting: Family Medicine

## 2019-07-05 ENCOUNTER — Other Ambulatory Visit: Payer: Medicare Other

## 2019-07-05 ENCOUNTER — Other Ambulatory Visit: Payer: Self-pay

## 2019-07-05 LAB — BAYER DCA HB A1C WAIVED: HB A1C (BAYER DCA - WAIVED): 7.3 % — ABNORMAL HIGH (ref <7.0)

## 2019-07-06 ENCOUNTER — Other Ambulatory Visit: Payer: Self-pay | Admitting: Family Medicine

## 2019-07-06 LAB — CBC WITH DIFFERENTIAL/PLATELET
Basophils Absolute: 0 10*3/uL (ref 0.0–0.2)
Basos: 1 %
EOS (ABSOLUTE): 0.2 10*3/uL (ref 0.0–0.4)
Eos: 3 %
Hematocrit: 43.4 % (ref 37.5–51.0)
Hemoglobin: 13.5 g/dL (ref 13.0–17.7)
Immature Grans (Abs): 0 10*3/uL (ref 0.0–0.1)
Immature Granulocytes: 0 %
Lymphocytes Absolute: 2 10*3/uL (ref 0.7–3.1)
Lymphs: 31 %
MCH: 25.7 pg — ABNORMAL LOW (ref 26.6–33.0)
MCHC: 31.1 g/dL — ABNORMAL LOW (ref 31.5–35.7)
MCV: 83 fL (ref 79–97)
Monocytes Absolute: 0.5 10*3/uL (ref 0.1–0.9)
Monocytes: 8 %
Neutrophils Absolute: 3.7 10*3/uL (ref 1.4–7.0)
Neutrophils: 57 %
Platelets: 237 10*3/uL (ref 150–450)
RBC: 5.26 x10E6/uL (ref 4.14–5.80)
RDW: 14.8 % (ref 11.6–15.4)
WBC: 6.5 10*3/uL (ref 3.4–10.8)

## 2019-07-06 LAB — CMP14+EGFR
ALT: 15 IU/L (ref 0–44)
AST: 16 IU/L (ref 0–40)
Albumin/Globulin Ratio: 1.4 (ref 1.2–2.2)
Albumin: 4.4 g/dL (ref 3.8–4.8)
Alkaline Phosphatase: 75 IU/L (ref 39–117)
BUN/Creatinine Ratio: 25 — ABNORMAL HIGH (ref 10–24)
BUN: 24 mg/dL (ref 8–27)
Bilirubin Total: 0.3 mg/dL (ref 0.0–1.2)
CO2: 19 mmol/L — ABNORMAL LOW (ref 20–29)
Calcium: 9.3 mg/dL (ref 8.6–10.2)
Chloride: 110 mmol/L — ABNORMAL HIGH (ref 96–106)
Creatinine, Ser: 0.97 mg/dL (ref 0.76–1.27)
GFR calc Af Amer: 92 mL/min/{1.73_m2} (ref >59)
GFR calc non Af Amer: 80 mL/min/{1.73_m2} (ref >59)
Globulin, Total: 3.2 g/dL (ref 1.5–4.5)
Glucose: 183 mg/dL — ABNORMAL HIGH (ref 65–99)
Potassium: 4.2 mmol/L (ref 3.5–5.2)
Sodium: 143 mmol/L (ref 134–144)
Total Protein: 7.6 g/dL (ref 6.0–8.5)

## 2019-07-06 LAB — THYROID PANEL WITH TSH
Free Thyroxine Index: 1.5 (ref 1.2–4.9)
T3 Uptake Ratio: 25 % (ref 24–39)
T4, Total: 6 ug/dL (ref 4.5–12.0)
TSH: 0.28 u[IU]/mL — ABNORMAL LOW (ref 0.450–4.500)

## 2019-07-06 LAB — LIPID PANEL
Chol/HDL Ratio: 3.7 ratio (ref 0.0–5.0)
Cholesterol, Total: 122 mg/dL (ref 100–199)
HDL: 33 mg/dL — ABNORMAL LOW (ref >39)
LDL Chol Calc (NIH): 69 mg/dL (ref 0–99)
Triglycerides: 108 mg/dL (ref 0–149)
VLDL Cholesterol Cal: 20 mg/dL (ref 5–40)

## 2019-07-07 ENCOUNTER — Ambulatory Visit (INDEPENDENT_AMBULATORY_CARE_PROVIDER_SITE_OTHER): Payer: Medicare Other | Admitting: Family Medicine

## 2019-07-07 ENCOUNTER — Encounter: Payer: Self-pay | Admitting: Family Medicine

## 2019-07-07 VITALS — BP 122/76 | HR 74 | Temp 98.9°F | Resp 20 | Ht 72.0 in | Wt 285.0 lb

## 2019-07-07 DIAGNOSIS — F411 Generalized anxiety disorder: Secondary | ICD-10-CM

## 2019-07-07 DIAGNOSIS — E113553 Type 2 diabetes mellitus with stable proliferative diabetic retinopathy, bilateral: Secondary | ICD-10-CM | POA: Diagnosis not present

## 2019-07-07 DIAGNOSIS — Z794 Long term (current) use of insulin: Secondary | ICD-10-CM

## 2019-07-07 DIAGNOSIS — N521 Erectile dysfunction due to diseases classified elsewhere: Secondary | ICD-10-CM

## 2019-07-07 DIAGNOSIS — E1169 Type 2 diabetes mellitus with other specified complication: Secondary | ICD-10-CM

## 2019-07-07 DIAGNOSIS — I152 Hypertension secondary to endocrine disorders: Secondary | ICD-10-CM

## 2019-07-07 DIAGNOSIS — R7989 Other specified abnormal findings of blood chemistry: Secondary | ICD-10-CM

## 2019-07-07 DIAGNOSIS — K5903 Drug induced constipation: Secondary | ICD-10-CM

## 2019-07-07 DIAGNOSIS — E559 Vitamin D deficiency, unspecified: Secondary | ICD-10-CM

## 2019-07-07 DIAGNOSIS — I10 Essential (primary) hypertension: Secondary | ICD-10-CM

## 2019-07-07 DIAGNOSIS — E11319 Type 2 diabetes mellitus with unspecified diabetic retinopathy without macular edema: Secondary | ICD-10-CM | POA: Insufficient documentation

## 2019-07-07 DIAGNOSIS — E1159 Type 2 diabetes mellitus with other circulatory complications: Secondary | ICD-10-CM | POA: Diagnosis not present

## 2019-07-07 MED ORDER — LACTULOSE 10 GM/15ML PO SOLN: 20.0000 g | Freq: Every day | ORAL | 6 refills | Status: DC | PRN

## 2019-07-07 MED ORDER — VITAMIN D (ERGOCALCIFEROL) 1.25 MG (50000 UNIT) PO CAPS: 50000.0000 [IU] | ORAL_CAPSULE | ORAL | 11 refills | Status: DC

## 2019-07-07 NOTE — Progress Notes (Signed)
Subjective:  Patient ID: [name], male    DOB: 1951/06/07, 68 y.o.   MRN: [id number]  Patient Care Team: Baruch Gouty, FNP as PCP - General (Family Medicine)   Chief Complaint:  Medical Management of Chronic Issues (3 mo ), Diabetes, and Hypertension   HPI: [name] is a 68 y.o. male presenting on 07/07/2019 for Medical Management of Chronic Issues (3 mo ), Diabetes, and Hypertension  1. Hypertension associated with diabetes ([employer]) Complaint with meds - Yes Current Medications - Norvasc, Lisinopril Checking BP at home ranging 130/80 Exercising Regularly - No Watching Salt intake - Yes Pertinent ROS:  Headache - No Fatigue - No Visual Disturbances - Yes, chronic Chest pain - No Dyspnea - No Palpitations - No LE edema - No They report good compliance with medications and can restate their regimen by memory. No medication side effects.  Family, social, and smoking history reviewed.   BP Readings from Last 3 Encounters:  07/07/19 122/76  04/06/19 125/73  03/11/19 (!) 98/57   CMP Latest Ref Rng & Units 07/05/2019 04/06/2019 02/09/2019  Glucose 65 - 99 mg/dL 183(H) 140(H) 149(H)  BUN 8 - 27 mg/dL 24 16 25(H)  Creatinine 0.76 - 1.27 mg/dL 0.97 0.97 1.14  Sodium 134 - 144 mmol/L 143 141 137  Potassium 3.5 - 5.2 mmol/L 4.2 4.8 4.4  Chloride 96 - 106 mmol/L 110(H) 109(H) 111  CO2 20 - 29 mmol/L 19(L) 17(L) 19(L)  Calcium 8.6 - 10.2 mg/dL 9.3 9.6 9.0  Total Protein 6.0 - 8.5 g/dL 7.6 8.1 7.6  Total Bilirubin 0.0 - 1.2 mg/dL 0.3 0.3 0.5  Alkaline Phos 39 - 117 IU/L 75 72 59  AST 0 - 40 IU/L 16 17 21   ALT 0 - 44 IU/L 15 12 20       2. 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  Current symptoms include visual disturbances. Patient denies foot ulcerations, hyperglycemia, hypoglycemia , increased appetite, nausea, paresthesia of  the feet, polydipsia, polyuria, vomiting and weight loss.  Current diabetic medications include Jardiance, Levemir, Humalog, Metformin Compliant with meds - Yes  Current monitoring regimen: home blood tests - 3 times daily Home blood sugar records: trend: fluctuating a bit Any episodes of hypoglycemia? no  Known diabetic complications: retinopathy, impotence and cardiovascular disease Cardiovascular risk factors: advanced age (older than 67 for men, 23 for women), diabetes mellitus, dyslipidemia, hypertension, male gender, obesity (BMI >= 30 kg/m2) and sedentary lifestyle Eye exam current (within one year): yes Podiatry yearly?  Yes Weight trend: stable Current diet: in general, a "healthy" diet   Current exercise: none  PNA Vaccine UTD?  Yes Hep B Vaccine?  Yes Tdap Vaccine UTD?  No Urine microalbumin UTD? Yes  Is He on ACE inhibitor or angiotensin II receptor blocker?  Yes, Lisinopril Is He on statin? No Is He on ASA 81 mg daily?  No    3. Erectile dysfunction associated with type 2 diabetes mellitus ([place]) Continues to have issues but does not wish to be placed on medications as this time.  4. Morbid obesity ([place]) Does not exercise on a regular basis. Has been more vigilant with his diet.   5. GAD (generalized anxiety disorder) Doing well with as needed Xanax. Does not take on a regular basis, only as needed.    6. Low TSH level Denies  insomnia, increased anxiety, weight loss, heat intolerance, palpitations, shortness of breath, diarrhea, or diaphoresis.   7. Constipation due to pain medication Ongoing issue. Takes Lactulose on an as needed basis and this does well for him.   8. Vitamin D deficiency Pt is taking oral repletion therapy. Denies bone pain and tenderness, muscle weakness, fracture, and difficulty walking. Lab Results  Component Value Date   VD25OH 49.1 04/06/2019   Lab Results  Component Value Date   CALCIUM 9.3 07/05/2019      9. Stable  proliferative diabetic retinopathy of both eyes associated with type 2 diabetes mellitus ([place]) Followed by [facility] and has a follow up scheduled in 1 month.      Relevant past medical, surgical, family, and social history reviewed and updated as indicated.  Allergies and medications reviewed and updated. Date reviewed: Chart in Epic.   Past Medical History:  Diagnosis Date  . Anxiety   . Arthritis   . Diabetes mellitus without complication ([place])   . GERD (gastroesophageal reflux disease)   . Hypercholesteremia   . Hypertension     Past Surgical History:  Procedure Laterality Date  . boils     removed form back of skull  . CATARACT EXTRACTION W/PHACO Right 04/02/2013   Procedure: CATARACT EXTRACTION PHACO AND INTRAOCULAR LENS PLACEMENT ([place]);  Surgeon: Williams Che, MD;  Location: [facility];  Service: Ophthalmology;  Laterality: Right;  CDE 7.00  . CATARACT EXTRACTION W/PHACO Left 10/04/2013   Procedure: CATARACT EXTRACTION PHACO AND INTRAOCULAR LENS PLACEMENT ([facility]);  Surgeon: Williams Che, MD;  Location: [facility];  Service: Ophthalmology;  Laterality: Left;  CDE:1.46  . COLONOSCOPY N/A 07/03/2015   Procedure: COLONOSCOPY;  Surgeon: Rogene Houston, MD;  Location: [facility] SU[facility];  Service: Endoscopy;  Laterality: N/A;  830  . EYE SURGERY Right    "valve replaced and stent placed" per pt to reroute the fluid in his eye  . ROTATOR CUFF REPAIR Right     Social History   Socioeconomic History  . Marital status: Divorced    Spouse name: Not on file  . Number of children: Not on file  . Years of education: Not on file  . Highest education level: Not on file  Occupational History  . Not on file  Social Needs  . Financial resource strain: Not on file  . Food insecurity    Worry: Not on file    Inability: Not on file  . Transportation needs    Medical: Not on file    Non-medical: Not on file  Tobacco Use  . Smoking status: Former Smoker    Packs/day: 0.50    Years:  41.00    Pack years: 20.50    Types: Cigarettes    Quit date: 02/29/2012    Years since quitting: 7.3  . Smokeless tobacco: Never Used  Substance and Sexual Activity  . Alcohol use: No    Comment: quit 2012  . Drug use: No    Comment: quit in 2012  . Sexual activity: Yes    Birth control/protection: None  Lifestyle  . Physical activity    Days per week: Not on file    Minutes per session: Not on file  . Stress: Not on file  Relationships  . Social Herbalist on phone: Not on file    Gets together: Not on file    Attends religious service: Not on file    Active member of club or  organization: Not on file    Attends meetings of clubs or organizations: Not on file    Relationship status: Not on file  . Intimate partner violence    Fear of current or ex partner: Not on file    Emotionally abused: Not on file    Physically abused: Not on file    Forced sexual activity: Not on file  Other Topics Concern  . Not on file  Social History Narrative  . Not on file    Outpatient Encounter Medications as of 07/07/2019  Medication Sig  . ACCU-CHEK AVIVA PLUS test strip USE TO MONITOR BLOOD GLUCOSE 3 TIME(S) DAILY. DX E11.9  . acetaZOLAMIDE (DIAMOX) 500 MG capsule Take 1 capsule by mouth 2 (two) times a day.  . ALPRAZolam (XANAX) 1 MG tablet Take 1 mg by mouth 3 (three) times daily as needed.  Marland Kitchen amLODipine (NORVASC) 5 MG tablet TAKE 1 TABLET BY MOUTH EVERY DAY  . brimonidine (ALPHAGAN) 0.2 % ophthalmic solution Place 1 drop into the left eye 2 (two) times daily.  . dorzolamide (TRUSOPT) 2 % ophthalmic solution Apply to eye.  . empagliflozin (JARDIANCE) 25 MG TABS tablet Take 25 mg by mouth daily before breakfast.  . Insulin Detemir (LEVEMIR FLEXPEN) 100 UNIT/ML Pen Inject 100 Units into the skin every morning.  . insulin lispro (HUMALOG KWIKPEN) 100 UNIT/ML KwikPen Inject 15 Units into the skin 3 (three) times daily.  . Insulin Pen Needle (B-D ULTRAFINE III SHORT PEN) 31G X 8  MM MISC Use to give insulin daily Dx E11.9  . lactulose (CHRONULAC) 10 GM/15ML solution Take 30 mLs (20 g total) by mouth daily as needed for up to 8 doses for mild constipation.  Marland Kitchen latanoprost (XALATAN) 0.005 % ophthalmic solution Place 1 drop into both eyes at bedtime.  Marland Kitchen lisinopril (ZESTRIL) 40 MG tablet TAKE 1 TABLET BY MOUTH EVERY DAY  . metFORMIN (GLUCOPHAGE-XR) 500 MG 24 hr tablet Take 1,000 mg by mouth 2 (two) times daily.  . Vitamin D, Ergocalciferol, (DRISDOL) 1.25 MG (50000 UT) CAPS capsule Take 1 capsule (50,000 Units total) by mouth once a week.  . [DISCONTINUED] lactulose (CHRONULAC) 10 GM/15ML solution TAKE 30 MLS (20 G TOTAL) BY MOUTH DAILY AS NEEDED FOR UP TO 8 DOSES FOR MILD CONSTIPATION.  . [DISCONTINUED] Vitamin D, Ergocalciferol, (DRISDOL) 1.25 MG (50000 UT) CAPS capsule Take 1 capsule by mouth once a week.   No facility-administered encounter medications on file as of 07/07/2019.     No Known Allergies  Review of Systems  Constitutional: Negative for activity change, appetite change, chills, diaphoresis, fatigue, fever and unexpected weight change.  HENT: Negative.   Eyes: Positive for visual disturbance. Negative for photophobia.  Respiratory: Negative for cough, chest tightness and shortness of breath.   Cardiovascular: Negative for chest pain, palpitations and leg swelling.  Gastrointestinal: Positive for constipation. Negative for abdominal distention, abdominal pain, anal bleeding, blood in stool, diarrhea, nausea, rectal pain and vomiting.  Endocrine: Negative.  Negative for cold intolerance, heat intolerance, polydipsia, polyphagia and polyuria.  Genitourinary: Negative for decreased urine volume, difficulty urinating, dysuria, frequency and urgency.  Musculoskeletal: Positive for arthralgias, back pain and gait problem. Negative for myalgias.  Skin: Negative.  Negative for color change and rash.  Allergic/Immunologic: Negative.   Neurological: Negative for  dizziness, tremors, seizures, syncope, facial asymmetry, speech difficulty, weakness, light-headedness, numbness and headaches.  Hematological: Negative.   Psychiatric/Behavioral: Negative for agitation, behavioral problems, confusion, decreased concentration, dysphoric mood, hallucinations, self-injury, sleep disturbance and  suicidal ideas. The patient is nervous/anxious. The patient is not hyperactive.   All other systems reviewed and are negative.       Objective:  BP 122/76   Pulse 74   Temp 98.9 F (37.2 C)   Resp 20   Ht 6' (1.829 m)   Wt 285 lb (129.3 kg)   SpO2 99%   BMI 38.65 kg/m    Wt Readings from Last 3 Encounters:  07/07/19 285 lb (129.3 kg)  04/06/19 277 lb (125.6 kg)  03/11/19 284 lb (128.8 kg)    Physical Exam Vitals signs and nursing note reviewed.  Constitutional:      General: He is not in acute distress.    Appearance: Normal appearance. He is well-developed and well-groomed. He is morbidly obese. He is not ill-appearing, toxic-appearing or diaphoretic.  HENT:     Head: Normocephalic and atraumatic.     Jaw: There is normal jaw occlusion.     Right Ear: Hearing normal.     Left Ear: Hearing normal.     Nose: Nose normal.     Mouth/Throat:     Lips: Pink.     Mouth: Mucous membranes are moist.     Pharynx: Oropharynx is clear. Uvula midline.  Eyes:     General: Lids are normal.     Extraocular Movements: Extraocular movements intact.     Conjunctiva/sclera: Conjunctivae normal.     Pupils: Pupils are equal, round, and reactive to light.  Neck:     Musculoskeletal: Normal range of motion and neck supple.     Thyroid: No thyroid mass, thyromegaly or thyroid tenderness.     Vascular: No carotid bruit or JVD.     Trachea: Trachea and phonation normal.  Cardiovascular:     Rate and Rhythm: Normal rate and regular rhythm.     Chest Wall: PMI is not displaced.     Pulses: Normal pulses.     Heart sounds: Normal heart sounds. No murmur. No friction  rub. No gallop.   Pulmonary:     Effort: Pulmonary effort is normal. No respiratory distress.     Breath sounds: Normal breath sounds. No wheezing.  Abdominal:     General: Bowel sounds are normal. There is no distension or abdominal bruit.     Palpations: Abdomen is soft. There is no hepatomegaly or splenomegaly.     Tenderness: There is no abdominal tenderness. There is no right CVA tenderness or left CVA tenderness.     Hernia: No hernia is present.  Musculoskeletal:     Right hip: Normal.     Left hip: Normal.     Thoracic back: Normal.     Lumbar back: He exhibits decreased range of motion, tenderness and pain. He exhibits no bony tenderness, no swelling, no edema, no deformity, no laceration, no spasm and normal pulse.     Right lower leg: No edema.     Left lower leg: No edema.  Lymphadenopathy:     Cervical: No cervical adenopathy.  Skin:    General: Skin is warm and dry.     Capillary Refill: Capillary refill takes less than 2 seconds.     Coloration: Skin is not cyanotic, jaundiced or pale.     Findings: No rash.  Neurological:     General: No focal deficit present.     Mental Status: He is alert and oriented to person, place, and time.     Cranial Nerves: Cranial nerves are intact. No cranial nerve  deficit.     Sensory: Sensation is intact. No sensory deficit.     Motor: Motor function is intact. No weakness.     Coordination: Coordination is intact. Coordination normal.     Gait: Gait abnormal (antalgic, uses cane).     Deep Tendon Reflexes: Reflexes are normal and symmetric. Reflexes normal.  Psychiatric:        Attention and Perception: Attention and perception normal.        Mood and Affect: Mood and affect normal.        Speech: Speech normal.        Behavior: Behavior normal. Behavior is cooperative.        Thought Content: Thought content normal.        Cognition and Memory: Cognition and memory normal.        Judgment: Judgment normal.     Results for  orders placed or performed in visit on 04/29/19  HM DIABETES EYE EXAM  Result Value Ref Range   HM Diabetic Eye Exam Retinopathy (A) No Retinopathy       Pertinent labs & imaging results that were available during my care of the patient were reviewed by me and considered in my medical decision making.  Assessment & Plan:  [place] was seen today for medical management of chronic issues, diabetes and hypertension.  Diagnoses and all orders for this visit:  Hypertension associated with diabetes ([place]) BP well controlled. Changes were not made in regimen. Goal BP is 130/80. Pt aware to report any persistent high or low readings. DASH diet and exercise encouraged. Exercise at least 150 minutes per week and increase as tolerated. Goal BMI > 25. Stress management encouraged. Avoid nicotine and tobacco product use. Avoid excessive alcohol and NSAID's. Avoid more than 2000 mg of sodium daily. Medications as prescribed. Follow up as scheduled.   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  Rx changes: none 2.  Education: Reviewed 'ABCs' of diabetes management (respective goals in parentheses):  A1C (<7), blood pressure (<130/80), BMI (<25), and cholesterol (LDL <100). 3.  Discussed pathophysiology of DM; difference between type 1 and type 2 DM. 4.  CHO counting diet discussed.  Reviewed CHO amount in various foods and how to read nutrition labels.  Discussed recommended serving sizes.  5.  Recommend check BG 3 times a day and record  readings to bring to next appointment. Report persistent high or low readings. 6.  Recommended increase physical activity - goal is 150 minutes per week and advance as tolerated. 7.  Adequate sleep, at least 6-8 hours per night.  8.  Smoking cessation.  9.  Follow up: 3 months    Erectile dysfunction associated with type 2 diabetes mellitus ([place]) Does not wish to initiate medication therapy at this time.   Morbid obesity ([place]) Diet and exercise encouraged.   GAD (generalized anxiety disorder) Doing well with prn Xanax. Will continue. No refills needed today.   Low TSH level Asymptomatic. Will continue to monitor. Report any development of symptoms.   Constipation  due to pain medication Does well with lactulose, will continue.  -     lactulose (CHRONULAC) 10 GM/15ML solution; Take 30 mLs (20 g total) by mouth daily as needed for up to 8 doses for mild constipation.  Vitamin D deficiency Labs pending. Continue repletion therapy. If indicated, will change repletion dosage. Eat foods rich in Vit D including milk, orange juice, yogurt with vitamin D added, salmon or mackerel, canned tuna fish, cereals with vitamin D added, and cod liver oil. Get out in the sun but make sure to wear at least SPF 30 sunscreen.  -     Vitamin D, Ergocalciferol, (DRISDOL) 1.25 MG (50000 UT) CAPS capsule; Take 1 capsule (50,000 Units total) by mouth once a week.  Stable proliferative diabetic retinopathy of both eyes associated with type 2 diabetes mellitus ([place]) Followed by [facility], next appointment in January 2021.   Total time spent with patient 40 mintues.  Greater than 50% of encounter spent in coordination of care/counseling.  Continue all other maintenance medications.  Follow up plan: Return in about 3 months (around 10/05/2019), or if symptoms worsen or fail to improve, for DM.  Continue healthy lifestyle choices, including diet (rich in fruits, vegetables, and lean proteins, and low in salt  and simple carbohydrates) and exercise (at least 30 minutes of moderate physical activity daily).  Educational handout given for DM  The above assessment and management plan was discussed with the patient. The patient verbalized understanding of and has agreed to the management plan. Patient is aware to call the clinic if they develop any new symptoms or if symptoms persist or worsen. Patient is aware when to return to the clinic for a follow-up visit. Patient educated on when it is appropriate to go to the emergency department.   Monia Pouch, FNP-C [facility] [phone]

## 2019-07-07 NOTE — Patient Instructions (Signed)

## 2019-09-01 ENCOUNTER — Other Ambulatory Visit: Payer: Self-pay | Admitting: *Deleted

## 2019-09-01 MED ORDER — ACCU-CHEK SOFTCLIX LANCETS MISC: 12 refills | Status: DC

## 2019-09-08 ENCOUNTER — Other Ambulatory Visit: Payer: Self-pay | Admitting: Family Medicine

## 2019-09-28 ENCOUNTER — Other Ambulatory Visit: Payer: Self-pay | Admitting: Family Medicine

## 2019-09-28 NOTE — Telephone Encounter (Signed)
Last refill 06/22/2019 Last office visit 07/07/2019 no refill at that time Office visit scheduled 10/11/2019

## 2019-10-08 ENCOUNTER — Other Ambulatory Visit: Payer: Self-pay

## 2019-10-08 ENCOUNTER — Other Ambulatory Visit: Payer: Medicare Other

## 2019-10-08 DIAGNOSIS — E1159 Type 2 diabetes mellitus with other circulatory complications: Secondary | ICD-10-CM | POA: Diagnosis not present

## 2019-10-08 DIAGNOSIS — E785 Hyperlipidemia, unspecified: Secondary | ICD-10-CM

## 2019-10-08 DIAGNOSIS — E559 Vitamin D deficiency, unspecified: Secondary | ICD-10-CM

## 2019-10-08 DIAGNOSIS — E1169 Type 2 diabetes mellitus with other specified complication: Secondary | ICD-10-CM

## 2019-10-08 DIAGNOSIS — I152 Hypertension secondary to endocrine disorders: Secondary | ICD-10-CM

## 2019-10-08 DIAGNOSIS — E113553 Type 2 diabetes mellitus with stable proliferative diabetic retinopathy, bilateral: Secondary | ICD-10-CM | POA: Diagnosis not present

## 2019-10-08 DIAGNOSIS — Z794 Long term (current) use of insulin: Secondary | ICD-10-CM

## 2019-10-08 DIAGNOSIS — I10 Essential (primary) hypertension: Secondary | ICD-10-CM | POA: Diagnosis not present

## 2019-10-08 LAB — BAYER DCA HB A1C WAIVED: HB A1C (BAYER DCA - WAIVED): 7.7 % — ABNORMAL HIGH (ref <7.0)

## 2019-10-09 LAB — CMP14+EGFR
ALT: 11 IU/L (ref 0–44)
AST: 17 IU/L (ref 0–40)
Albumin/Globulin Ratio: 1.2 (ref 1.2–2.2)
Albumin: 4.2 g/dL (ref 3.8–4.8)
Alkaline Phosphatase: 76 IU/L (ref 39–117)
BUN/Creatinine Ratio: 17 (ref 10–24)
BUN: 18 mg/dL (ref 8–27)
Bilirubin Total: <0.2 mg/dL (ref 0.0–1.2)
CO2: 20 mmol/L (ref 20–29)
Calcium: 9.5 mg/dL (ref 8.6–10.2)
Chloride: 110 mmol/L — ABNORMAL HIGH (ref 96–106)
Creatinine, Ser: 1.03 mg/dL (ref 0.76–1.27)
GFR calc Af Amer: 85 mL/min/{1.73_m2} (ref >59)
GFR calc non Af Amer: 74 mL/min/{1.73_m2} (ref >59)
Globulin, Total: 3.5 g/dL (ref 1.5–4.5)
Glucose: 148 mg/dL — ABNORMAL HIGH (ref 65–99)
Potassium: 4.5 mmol/L (ref 3.5–5.2)
Sodium: 144 mmol/L (ref 134–144)
Total Protein: 7.7 g/dL (ref 6.0–8.5)

## 2019-10-09 LAB — CBC WITH DIFFERENTIAL/PLATELET
Basophils Absolute: 0.1 10*3/uL (ref 0.0–0.2)
Basos: 1 %
EOS (ABSOLUTE): 0.2 10*3/uL (ref 0.0–0.4)
Eos: 3 %
Hematocrit: 44.6 % (ref 37.5–51.0)
Hemoglobin: 13.6 g/dL (ref 13.0–17.7)
Immature Grans (Abs): 0 10*3/uL (ref 0.0–0.1)
Immature Granulocytes: 0 %
Lymphocytes Absolute: 3 10*3/uL (ref 0.7–3.1)
Lymphs: 37 %
MCH: 25.2 pg — ABNORMAL LOW (ref 26.6–33.0)
MCHC: 30.5 g/dL — ABNORMAL LOW (ref 31.5–35.7)
MCV: 83 fL (ref 79–97)
Monocytes Absolute: 0.5 10*3/uL (ref 0.1–0.9)
Monocytes: 6 %
Neutrophils Absolute: 4.1 10*3/uL (ref 1.4–7.0)
Neutrophils: 53 %
Platelets: 248 10*3/uL (ref 150–450)
RBC: 5.39 x10E6/uL (ref 4.14–5.80)
RDW: 15.6 % — ABNORMAL HIGH (ref 11.6–15.4)
WBC: 7.9 10*3/uL (ref 3.4–10.8)

## 2019-10-09 LAB — LIPID PANEL
Chol/HDL Ratio: 3.6 ratio (ref 0.0–5.0)
Cholesterol, Total: 114 mg/dL (ref 100–199)
HDL: 32 mg/dL — ABNORMAL LOW (ref >39)
LDL Chol Calc (NIH): 59 mg/dL (ref 0–99)
Triglycerides: 125 mg/dL (ref 0–149)
VLDL Cholesterol Cal: 23 mg/dL (ref 5–40)

## 2019-10-11 ENCOUNTER — Ambulatory Visit (INDEPENDENT_AMBULATORY_CARE_PROVIDER_SITE_OTHER): Payer: Medicare Other | Admitting: Family Medicine

## 2019-10-11 ENCOUNTER — Other Ambulatory Visit: Payer: Self-pay

## 2019-10-11 ENCOUNTER — Encounter: Payer: Self-pay | Admitting: Family Medicine

## 2019-10-11 VITALS — BP 120/71 | HR 73 | Temp 98.4°F | Resp 20 | Ht 72.0 in | Wt 287.0 lb

## 2019-10-11 DIAGNOSIS — E1159 Type 2 diabetes mellitus with other circulatory complications: Secondary | ICD-10-CM

## 2019-10-11 DIAGNOSIS — E1169 Type 2 diabetes mellitus with other specified complication: Secondary | ICD-10-CM

## 2019-10-11 DIAGNOSIS — E113553 Type 2 diabetes mellitus with stable proliferative diabetic retinopathy, bilateral: Secondary | ICD-10-CM

## 2019-10-11 DIAGNOSIS — I10 Essential (primary) hypertension: Secondary | ICD-10-CM

## 2019-10-11 DIAGNOSIS — R7989 Other specified abnormal findings of blood chemistry: Secondary | ICD-10-CM

## 2019-10-11 DIAGNOSIS — Z794 Long term (current) use of insulin: Secondary | ICD-10-CM

## 2019-10-11 DIAGNOSIS — E785 Hyperlipidemia, unspecified: Secondary | ICD-10-CM

## 2019-10-11 DIAGNOSIS — I152 Hypertension secondary to endocrine disorders: Secondary | ICD-10-CM

## 2019-10-11 DIAGNOSIS — N521 Erectile dysfunction due to diseases classified elsewhere: Secondary | ICD-10-CM

## 2019-10-11 NOTE — Progress Notes (Signed)
Subjective:  Patient ID: [name], male    DOB: Jul 26, 1951, 69 y.o.   MRN: [id number]  Patient Care Team: [name], DO as PCP - General (Family Medicine)   Chief Complaint:  Medical Management of Chronic Issues (3 mo ), Hyperlipidemia, Diabetes, and Hypertension   HPI: [name] is a 69 y.o. male presenting on 10/11/2019 for Medical Management of Chronic Issues (3 mo ), Hyperlipidemia, Diabetes, and Hypertension   1. Hypertension associated with diabetes ([place]) Well controlled, 120/71 in office today. Patient reports good compliance with medications without any side effects. He denies dizziness, lightheadedness, chest pain, edema or shortness of breath. Does not check BP at home.  2. 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 He states that he was able to get some from the health department. He reports that he checks his BG 3x/day with readings mostly in the 120s with highest readings right around 200. He reports he is compliant with his medications other than when he ran out of his levemir. He reports a diet high in vegetables, lean meats and low in carbs, high in fried foods and sodas. He reports he has not been able to exercise recently due to the weather but is trying to get back outside to walk. Sees podiatry for toenail trimming and foot exams regularly. Currently on ace inhibitior, no statin therapy.   3. Hyperlipidemia associated with type 2 diabetes mellitus ([place]) No statin therapy at this time.  Diet - high in vegetables and lean meats and proteins as well as fried foods and soda.  Exercise - was getting out walking a lot but has not been able to due to the weather recently.  Lipid panel WNL, well controlled with diet.   Lab Results  Component Value Date   CHOL 114  10/08/2019   HDL 32 (L) 10/08/2019   LDLCALC 59 10/08/2019   TRIG 125 10/08/2019   CHOLHDL 3.6 10/08/2019     Family and personal medical history reviewed. Smoking and ETOH history reviewed.   4. Morbid obesity ([place]) Weight 287. Patient reports that he typically tries to get out and walk for exercise. States that he has not been able to recently due to weather but is hopeful with warmer weather he will be able to get back outside. Eats diet high in vegetables, lean proteins, soda and fried foods.   5. Low TSH level Weight - 287 Bowel habit changes - No Heat or cold intolerance - No Mood changes - No Changes in sleep habits - No Fatigue - No Skin, hair, or nail changes - No Tremor - No Palpitations - No Edema - No Shortness of breath - No   6. Erectile Dysfunction associated with Type 2 diabetes: Reports this is an ongoing issue, has tried viagra and cialis in the past without good result     Lab Results  Component Value Date   TSH 0.280 (L) 07/05/2019       Relevant past medical, surgical, family, and social history reviewed and updated as indicated.  Allergies and medications reviewed and updated. Date reviewed: Chart in Epic.   Past Medical History:  Diagnosis Date  . Anxiety   . Arthritis   . Diabetes mellitus without complication ([place])   . GERD (gastroesophageal reflux  disease)   . Hypercholesteremia   . Hypertension     Past Surgical History:  Procedure Laterality Date  . boils     removed form back of skull  . CATARACT EXTRACTION W/PHACO Right 04/02/2013   Procedure: CATARACT EXTRACTION PHACO AND INTRAOCULAR LENS PLACEMENT ([place]);  Surgeon: Williams Che, MD;  Location: [facility];  Service: Ophthalmology;  Laterality: Right;  CDE 7.00  . CATARACT EXTRACTION W/PHACO Left 10/04/2013   Procedure: CATARACT EXTRACTION PHACO AND INTRAOCULAR LENS PLACEMENT ([facility]);  Surgeon: Williams Che, MD;  Location: [facility];  Service: Ophthalmology;  Laterality: Left;   CDE:1.46  . COLONOSCOPY N/A 07/03/2015   Procedure: COLONOSCOPY;  Surgeon: Rogene Houston, MD;  Location: [facility];  Service: Endoscopy;  Laterality: N/A;  830  . EYE SURGERY Right    "valve replaced and stent placed" per pt to reroute the fluid in his eye  . ROTATOR CUFF REPAIR Right     Social History   Socioeconomic History  . Marital status: Divorced    Spouse name: Not on file  . Number of children: Not on file  . Years of education: Not on file  . Highest education level: Not on file  Occupational History  . Not on file  Tobacco Use  . Smoking status: Former Smoker    Packs/day: 0.50    Years: 41.00    Pack years: 20.50    Types: Cigarettes    Quit date: 02/29/2012    Years since quitting: 7.6  . Smokeless tobacco: Never Used  Substance and Sexual Activity  . Alcohol use: No    Comment: quit 2012  . Drug use: No    Comment: quit in 2012  . Sexual activity: Yes    Birth control/protection: None  Other Topics Concern  . Not on file  Social History Narrative  . Not on file   Social Determinants of Health   Financial Resource Strain:   . Difficulty of Paying Living Expenses: Not on file  Food Insecurity:   . Worried About Charity fundraiser in the Last Year: Not on file  . Ran Out of Food in the Last Year: Not on file  Transportation Needs:   . Lack of Transportation (Medical): Not on file  . Lack of Transportation (Non-Medical): Not on file  Physical Activity:   . Days of Exercise per Week: Not on file  . Minutes of Exercise per Session: Not on file  Stress:   . Feeling of Stress : Not on file  Social Connections:   . Frequency of Communication with Friends and Family: Not on file  . Frequency of Social Gatherings with Friends and Family: Not on file  . Attends Religious Services: Not on file  . Active Member of Clubs or Organizations: Not on file  . Attends Archivist Meetings: Not on file  . Marital Status: Not on file  Intimate  Partner Violence:   . Fear of Current or Ex-Partner: Not on file  . Emotionally Abused: Not on file  . Physically Abused: Not on file  . Sexually Abused: Not on file     No Known Allergies  Review of Systems  Constitutional: Negative for activity change, appetite change, fatigue and unexpected weight change.  HENT: Negative.   Eyes: Negative.   Respiratory: Negative for chest tightness and shortness of breath.   Cardiovascular: Negative for chest pain, palpitations and leg swelling.  Gastrointestinal: Negative for abdominal pain, constipation, diarrhea and nausea.  Endocrine: Negative for cold intolerance, heat intolerance, polydipsia and polyphagia.  Genitourinary: Negative for difficulty urinating and frequency.  Musculoskeletal: Negative for gait problem and myalgias.  Skin: Negative for color change.  Allergic/Immunologic: Negative.   Neurological: Negative for dizziness, weakness and numbness.  Hematological: Negative.   Psychiatric/Behavioral: Negative for behavioral problems and sleep disturbance. The patient is not nervous/anxious.   All other systems reviewed and are negative.       Objective:  BP 120/71   Pulse 73   Temp 98.4 F (36.9 C)   Resp 20   Ht 6' (1.829 m)   Wt 287 lb (130.2 kg)   SpO2 100%   BMI 38.92 kg/m    Wt Readings from Last 3 Encounters:  10/11/19 287 lb (130.2 kg)  07/07/19 285 lb (129.3 kg)  04/06/19 277 lb (125.6 kg)    Physical Exam Vitals and nursing note reviewed.  Constitutional:      Appearance: Normal appearance. He is normal weight.  HENT:     Head: Normocephalic and atraumatic.     Nose: Nose normal.     Mouth/Throat:     Mouth: Mucous membranes are moist.  Eyes:     Pupils: Pupils are equal, round, and reactive to light.  Cardiovascular:     Rate and Rhythm: Normal rate and regular rhythm.     Pulses: Normal pulses.     Heart sounds: Normal heart sounds.  Pulmonary:     Effort: Pulmonary effort is normal.      Breath sounds: Normal breath sounds.  Abdominal:     General: Abdomen is flat. Bowel sounds are normal.     Palpations: Abdomen is soft.  Genitourinary:    Penis: Normal.   Musculoskeletal:        General: Normal range of motion.     Cervical back: Normal range of motion and neck supple.  Skin:    General: Skin is warm and dry.     Capillary Refill: Capillary refill takes less than 2 seconds.  Neurological:     General: No focal deficit present.     Mental Status: He is alert and oriented to person, place, and time. Mental status is at baseline.  Psychiatric:        Mood and Affect: Mood normal.        Behavior: Behavior normal.        Thought Content: Thought content normal.        Judgment: Judgment normal.     Results for orders placed or performed in visit on 10/08/19  CBC with Differential/Platelet  Result Value Ref Range   WBC 7.9 3.4 - 10.8 x10E3/uL   RBC 5.39 4.14 - 5.80 x10E6/uL   Hemoglobin 13.6 13.0 - 17.7 g/dL   Hematocrit 44.6 37.5 - 51.0 %   MCV 83 79 - 97 fL   MCH 25.2 (L) 26.6 - 33.0 pg   MCHC 30.5 (L) 31.5 - 35.7 g/dL   RDW 15.6 (H) 11.6 - 15.4 %   Platelets 248 150 - 450 x10E3/uL   Neutrophils 53 Not Estab. %   Lymphs 37 Not Estab. %   Monocytes 6 Not Estab. %   Eos 3 Not Estab. %   Basos 1 Not Estab. %   Neutrophils Absolute 4.1 1.4 - 7.0 x10E3/uL   Lymphocytes Absolute 3.0 0.7 - 3.1 x10E3/uL   Monocytes Absolute 0.5 0.1 - 0.9 x10E3/uL   EOS (ABSOLUTE) 0.2 0.0 - 0.4 x10E3/uL   Basophils Absolute 0.1 0.0 -  0.2 x10E3/uL   Immature Granulocytes 0 Not Estab. %   Immature Grans (Abs) 0.0 0.0 - 0.1 x10E3/uL  CMP14+EGFR  Result Value Ref Range   Glucose 148 (H) 65 - 99 mg/dL   BUN 18 8 - 27 mg/dL   Creatinine, Ser 1.03 0.76 - 1.27 mg/dL   GFR calc non Af Amer 74 >59 mL/min/1.73   GFR calc Af Amer 85 >59 mL/min/1.73   BUN/Creatinine Ratio 17 10 - 24   Sodium 144 134 - 144 mmol/L   Potassium 4.5 3.5 - 5.2 mmol/L   Chloride 110 (H) 96 - 106 mmol/L    CO2 20 20 - 29 mmol/L   Calcium 9.5 8.6 - 10.2 mg/dL   Total Protein 7.7 6.0 - 8.5 g/dL   Albumin 4.2 3.8 - 4.8 g/dL   Globulin, Total 3.5 1.5 - 4.5 g/dL   Albumin/Globulin Ratio 1.2 1.2 - 2.2   Bilirubin Total <0.2 0.0 - 1.2 mg/dL   Alkaline Phosphatase 76 39 - 117 IU/L   AST 17 0 - 40 IU/L   ALT 11 0 - 44 IU/L  Lipid panel  Result Value Ref Range   Cholesterol, Total 114 100 - 199 mg/dL   Triglycerides 125 0 - 149 mg/dL   HDL 32 (L) >39 mg/dL   VLDL Cholesterol Cal 23 5 - 40 mg/dL   LDL Chol Calc (NIH) 59 0 - 99 mg/dL   Chol/HDL Ratio 3.6 0.0 - 5.0 ratio  Bayer DCA Hb A1c Waived  Result Value Ref Range   HB A1C (BAYER DCA - WAIVED) 7.7 (H) <7.0 %       Pertinent labs & imaging results that were available during my care of the patient were reviewed by me and considered in my medical decision making.  Assessment & Plan:  Lamontae was seen today for medical management of chronic issues, hyperlipidemia, diabetes and hypertension.  Diagnoses and all orders for this visit:  Hypertension associated with diabetes ([place]) Continue current medication regimen with good result. Discussed checking BP at same time each day 3x/week and increasing exercise as tolerated.   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 Pt was out of levemir for a few days, will not adjust today. If this remains elevated at next appointment, will change medications at that time.  Continue current medication regimen with good result. Discussed contacting office if he has trouble affording his medications so he can maintain compliance.. Discussed cutting out sodas and increasing water intake and exercise as able. Continue checking BG 3x/day and keeping log.   Hyperlipidemia associated with type 2 diabetes mellitus ([place]) Continue current medication regimen with good result. Increase exercise as able, Discussed decreased fried  foods and increasing grilled, baked foods, continuing diet high in vegetables and lean protein.   Morbid obesity ([place]) Discussed increasing exercise as able, cutting sodas and fried foods.  Low TSH level No symptoms currently, discussed reporting any changes in mood, skin, hair, nails, sob, palpitations, edema, bowel habits or fatigue.    Erectile Dysfunction associated with Type 2 diabetes History of viagra and cialis use without improvement in symptoms, discussed need for keeping blood sugar and blood pressure under control in order to prevent worsening of symptoms.    Continue all other maintenance medications.  Follow up plan: Return in about 3 months (around 01/11/2020), or if symptoms worsen or fail to improve, for DM.  Continue healthy lifestyle choices,  including diet (rich in fruits, vegetables, and lean proteins, and low in salt and simple carbohydrates) and exercise (at least 30 minutes of moderate physical activity daily).  Educational handout given for Diabetes Mellitus.   The above assessment and management plan was discussed with the patient. The patient verbalized understanding of and has agreed to the management plan. Patient is aware to call the clinic if they develop any new symptoms or if symptoms persist or worsen. Patient is aware when to return to the clinic for a follow-up visit. Patient educated on when it is appropriate to go to the emergency department.   Scherrie Gerlach, BSN, RN, AGNP-Student.    I personally was present during the history, physical exam, and medical decision-making activities of this service and have verified that the service and findings are accurately documented in the nurse practitioner student's note.  Monia Pouch, FNP-C [facility] [address] [place], [place] [address] [phone]

## 2019-10-11 NOTE — Patient Instructions (Signed)

## 2019-11-02 ENCOUNTER — Other Ambulatory Visit: Payer: Self-pay | Admitting: Family Medicine

## 2019-11-09 ENCOUNTER — Other Ambulatory Visit: Payer: Self-pay

## 2019-11-09 ENCOUNTER — Ambulatory Visit (INDEPENDENT_AMBULATORY_CARE_PROVIDER_SITE_OTHER): Payer: Medicare Other | Admitting: Ophthalmology

## 2019-11-09 ENCOUNTER — Encounter (INDEPENDENT_AMBULATORY_CARE_PROVIDER_SITE_OTHER): Payer: Self-pay | Admitting: Ophthalmology

## 2019-11-09 DIAGNOSIS — E113512 Type 2 diabetes mellitus with proliferative diabetic retinopathy with macular edema, left eye: Secondary | ICD-10-CM | POA: Diagnosis not present

## 2019-11-09 DIAGNOSIS — H401133 Primary open-angle glaucoma, bilateral, severe stage: Secondary | ICD-10-CM | POA: Insufficient documentation

## 2019-11-09 DIAGNOSIS — E113511 Type 2 diabetes mellitus with proliferative diabetic retinopathy with macular edema, right eye: Secondary | ICD-10-CM | POA: Insufficient documentation

## 2019-11-09 DIAGNOSIS — H3550 Unspecified hereditary retinal dystrophy: Secondary | ICD-10-CM

## 2019-11-09 DIAGNOSIS — H4720 Unspecified optic atrophy: Secondary | ICD-10-CM | POA: Diagnosis not present

## 2019-11-09 MED ORDER — BEVACIZUMAB CHEMO INJECTION 1.25MG/0.05ML SYRINGE FOR KALEIDOSCOPE
1.2500 mg | INTRAVITREAL | Status: AC | PRN
  Administered 2019-11-09: 1.25 mg via INTRAVITREAL

## 2019-11-23 ENCOUNTER — Other Ambulatory Visit: Payer: Self-pay

## 2019-11-23 ENCOUNTER — Ambulatory Visit (INDEPENDENT_AMBULATORY_CARE_PROVIDER_SITE_OTHER): Payer: Medicare Other | Admitting: Family Medicine

## 2019-11-23 ENCOUNTER — Encounter: Payer: Self-pay | Admitting: Family Medicine

## 2019-11-23 VITALS — BP 135/76 | HR 75 | Temp 97.5°F | Ht 73.0 in | Wt 277.8 lb

## 2019-11-23 DIAGNOSIS — R12 Heartburn: Secondary | ICD-10-CM | POA: Diagnosis not present

## 2019-11-23 DIAGNOSIS — R42 Dizziness and giddiness: Secondary | ICD-10-CM | POA: Diagnosis not present

## 2019-11-23 MED ORDER — MECLIZINE HCL 12.5 MG PO TABS: 12.5000 mg | ORAL_TABLET | Freq: Three times a day (TID) | ORAL | 0 refills | Status: DC | PRN

## 2019-11-23 MED ORDER — FAMOTIDINE 20 MG PO TABS: 20.0000 mg | ORAL_TABLET | Freq: Two times a day (BID) | ORAL | 2 refills | Status: DC

## 2019-11-23 NOTE — Progress Notes (Signed)
Assessment & Plan:  1. Dizziness - Patient does feel like he can tell a difference with and without his glasses on.  Advised him to pay more attention to this so that if he is feeling off balance when he has his glasses off he knows to wear them all the time and vice versa if he is feeling off balance with them on he knows he needs to go back and adjust his prescription.  Gave a trial prescription of meclizine.  Education provided on dizziness. - meclizine (ANTIVERT) 12.5 MG tablet; Take 1 tablet (12.5 mg total) by mouth 3 (three) times daily as needed for dizziness.  Dispense: 30 tablet; Refill: 0  2. Heartburn - Education provided on heartburn. - famotidine (PEPCID) 20 MG tablet; Take 1 tablet (20 mg total) by mouth 2 (two) times daily.  Dispense: 60 tablet; Refill: 2   Follow up plan: Return if symptoms worsen or fail to improve.  Hendricks Limes, MSN, APRN, FNP-C [facility]  Subjective:   Patient ID: [name], male    DOB: Feb 07, 1951, 69 y.o.   MRN: [id number]  HPI: [name] is a 69 y.o. male presenting on 11/23/2019 for off balance (x 2 weeks) and Heartburn  Patient reports he has been feeling off balance for about 2 weeks now.  States that he holds onto the door while shaving.  He is walking with a cane.  States that this feeling is constant.  Denies allergies but has been sneezing every now and then.  He does report that he got a new prescription of glasses recently, and that this feeling of being off balance started at the same time.  He does check his blood sugars at home and states they have been running good, between 80-125.   Patient is also concerned about heartburn with a lot of belching.  He is not taking any medication to treat his symptoms.   ROS: Negative unless specifically indicated above in HPI.   Relevant past medical history reviewed and updated as indicated.   Allergies and medications reviewed and updated.   Current Outpatient  Medications:  .  ACCU-CHEK AVIVA PLUS test strip, USE TO MONITOR BLOOD GLUCOSE 3 TIME(S) DAILY. DX E11.9, Disp: 300 strip, Rfl: 3 .  Accu-Chek Softclix Lancets lancets, Check BS TID Dx E11.9, Disp: 100 each, Rfl: 12 .  acetaZOLAMIDE (DIAMOX) 500 MG capsule, Take 1 capsule by mouth 2 (two) times a day., Disp: , Rfl:  .  amLODipine (NORVASC) 5 MG tablet, TAKE 1 TABLET BY MOUTH EVERY DAY, Disp: 90 tablet, Rfl: 1 .  dorzolamide (TRUSOPT) 2 % ophthalmic solution, Apply to eye., Disp: , Rfl:  .  empagliflozin (JARDIANCE) 25 MG TABS tablet, Take 25 mg by mouth daily before breakfast., Disp: 90 tablet, Rfl: 0 .  Insulin Detemir (LEVEMIR FLEXPEN) 100 UNIT/ML Pen, Inject 70 Units into the skin 2 (two) times daily. , Disp: , Rfl:  .  insulin lispro (HUMALOG KWIKPEN) 100 UNIT/ML KwikPen, Inject 15 Units into the skin 3 (three) times daily., Disp: , Rfl:  .  Insulin Pen Needle (B-D ULTRAFINE III SHORT PEN) 31G X 8 MM MISC, Use to give insulin daily Dx E11.9, Disp: 100 each, Rfl: 3 .  lactulose (CHRONULAC) 10 GM/15ML solution, Take 30 mLs (20 g total) by mouth daily as needed for up to 8 doses for mild constipation., Disp: 240 mL, Rfl: 6 .  latanoprost (XALATAN) 0.005 % ophthalmic solution, Place 1 drop into both eyes  at bedtime., Disp: , Rfl:  .  lisinopril (ZESTRIL) 40 MG tablet, TAKE 1 TABLET BY MOUTH EVERY DAY, Disp: 90 tablet, Rfl: 1 .  metFORMIN (GLUCOPHAGE-XR) 500 MG 24 hr tablet, TAKE 2 TABLETS BY MOUTH TWICE A DAY WITH FOOD, Disp: 360 tablet, Rfl: 1 .  rosuvastatin (CRESTOR) 20 MG tablet, TAKE 1 TABLET BY MOUTH EVERYDAY AT BEDTIME, Disp: 90 tablet, Rfl: 1 .  ALPRAZolam (XANAX) 1 MG tablet, TAKE 1 TABLET (1 MG TOTAL) BY MOUTH 3 (THREE) TIMES DAILY AS NEEDED FOR ANXIETY. (Patient not taking: Reported on 11/23/2019), Disp: 90 tablet, Rfl: 0 .  famotidine (PEPCID) 20 MG tablet, Take 1 tablet (20 mg total) by mouth 2 (two) times daily., Disp: 60 tablet, Rfl: 2 .  meclizine (ANTIVERT) 12.5 MG tablet, Take 1  tablet (12.5 mg total) by mouth 3 (three) times daily as needed for dizziness., Disp: 30 tablet, Rfl: 0  No Known Allergies  Objective:   BP 135/76   Pulse 75   Temp (!) 97.5 F (36.4 C) (Temporal)   Ht 6\' 1"  (1.854 m)   Wt 277 lb 12.8 oz (126 kg)   SpO2 96%   BMI 36.65 kg/m    Physical Exam Vitals reviewed.  Constitutional:      General: He is not in acute distress.    Appearance: Normal appearance. He is obese. He is not ill-appearing, toxic-appearing or diaphoretic.  HENT:     Head: Normocephalic and atraumatic.     Right Ear: Tympanic membrane, ear canal and external ear normal. There is no impacted cerumen.     Left Ear: Tympanic membrane, ear canal and external ear normal. There is no impacted cerumen.  Eyes:     General: No scleral icterus.       Right eye: No discharge.        Left eye: No discharge.     Conjunctiva/sclera: Conjunctivae normal.  Cardiovascular:     Rate and Rhythm: Normal rate and regular rhythm.     Heart sounds: Normal heart sounds. No murmur. No friction rub. No gallop.   Pulmonary:     Effort: Pulmonary effort is normal. No respiratory distress.     Breath sounds: Normal breath sounds. No stridor. No wheezing, rhonchi or rales.  Musculoskeletal:        General: Normal range of motion.     Cervical back: Normal range of motion.  Skin:    General: Skin is warm and dry.  Neurological:     Mental Status: He is alert and oriented to person, place, and time. Mental status is at baseline.     Gait: Gait abnormal (ambulates with cane).  Psychiatric:        Mood and Affect: Mood normal.        Behavior: Behavior normal.        Thought Content: Thought content normal.        Judgment: Judgment normal.

## 2019-11-23 NOTE — Patient Instructions (Signed)
Dizziness Dizziness is a common problem. It makes you feel unsteady or light-headed. You may feel like you are about to pass out (faint). Dizziness can lead to getting hurt if you stumble or fall. Dizziness can be caused by many things, including:  Medicines.  Not having enough water in your body (dehydration).  Illness. Follow these instructions at home: Eating and drinking   Drink enough fluid to keep your pee (urine) clear or pale yellow. This helps to keep you from getting dehydrated. Try to drink more clear fluids, such as water.  Do not drink alcohol.  Limit how much caffeine you drink or eat, if your doctor tells you to do that.  Limit how much salt (sodium) you drink or eat, if your doctor tells you to do that. Activity   Avoid making quick movements. ? When you stand up from sitting in a chair, steady yourself until you feel okay. ? In the morning, first sit up on the side of the bed. When you feel okay, stand slowly while you hold onto something. Do this until you know that your balance is fine.  If you need to stand in one place for a long time, move your legs often. Tighten and relax the muscles in your legs while you are standing.  Do not drive or use heavy machinery if you feel dizzy.  Avoid bending down if you feel dizzy. Place items in your home so you can reach them easily without leaning over. Lifestyle  Do not use any products that contain nicotine or tobacco, such as cigarettes and e-cigarettes. If you need help quitting, ask your doctor.  Try to lower your stress level. You can do this by using methods such as yoga or meditation. Talk with your doctor if you need help. General instructions  Watch your dizziness for any changes.  Take over-the-counter and prescription medicines only as told by your doctor. Talk with your doctor if you think that you are dizzy because of a medicine that you are taking.  Tell a friend or a family member that you are  feeling dizzy. If he or she notices any changes in your behavior, have this person call your doctor.  Keep all follow-up visits as told by your doctor. This is important. Contact a doctor if:  Your dizziness does not go away.  Your dizziness or light-headedness gets worse.  You feel sick to your stomach (nauseous).  You have trouble hearing.  You have new symptoms.  You are unsteady on your feet.  You feel like the room is spinning. Get help right away if:  You throw up (vomit) or have watery poop (diarrhea), and you cannot eat or drink anything.  You have trouble: ? Talking. ? Walking. ? Swallowing. ? Using your arms, hands, or legs.  You feel generally weak.  You are not thinking clearly, or you have trouble forming sentences. A friend or family member may notice this.  You have: ? Chest pain. ? Pain in your belly (abdomen). ? Shortness of breath. ? Sweating.  Your vision changes.  You are bleeding.  You have a very bad headache.  You have neck pain or a stiff neck.  You have a fever. These symptoms may be an emergency. Do not wait to see if the symptoms will go away. Get medical help right away. Call your local emergency services (911 in the [place] Do not drive yourself to the hospital. Summary  Dizziness makes you feel unsteady or light-headed.  You may feel like you are about to pass out (faint).  Drink enough fluid to keep your pee (urine) clear or pale yellow. Do not drink alcohol.  Avoid making quick movements if you feel dizzy.  Watch your dizziness for any changes. This information is not intended to replace advice given to you by your health care provider. Make sure you discuss any questions you have with your health care provider. Document Revised: 07/25/2017 Document Reviewed: 08/08/2016 Elsevier Patient Education  [place].  Heartburn Heartburn is a type of pain or discomfort that can happen in the throat or chest. It is often  described as a burning pain. It may also cause a bad, acid-like taste in the mouth. Heartburn may feel worse when you lie down or bend over. It may be worse at night. It may be caused by stomach contents that move back up (reflux) into the tube that connects the mouth with the stomach (esophagus). Follow these instructions at home: Eating and drinking   Avoid certain foods and drinks as told by your doctor. This may include: ? Coffee and tea (with or without caffeine). ? Drinks that have alcohol. ? Energy drinks and sports drinks. ? Carbonated drinks or sodas. ? Chocolate and cocoa. ? Peppermint and mint flavorings. ? Garlic and onions. ? Horseradish. ? Spicy and acidic foods, such as:  Peppers.  Chili powder and curry powder.  Vinegar.  Hot sauces and BBQ sauce. ? Citrus fruit juices and citrus fruits, such as:  Oranges.  Lemons.  Limes. ? Tomato-based foods, such as:  Red sauce and pizza with red sauce.  Chili.  Salsa. ? Fried and fatty foods, such as:  Donuts.  [place] fries and potato chips.  High-fat dressings. ? High-fat meats, such as:  Hot dogs and sausage.  Rib eye steak.  Ham and bacon. ? High-fat dairy items, such as:  Whole milk.  Butter.  Cream cheese.  Eat small meals often. Avoid eating large meals.  Avoid drinking large amounts of liquid with your meals.  Avoid eating meals during the 2-3 hours before bedtime.  Avoid lying down right after you eat.  Do not exercise right after you eat. Lifestyle      If you are overweight, lose an amount of weight that is healthy for you. Ask your doctor about a safe weight loss goal.  Do not use any products that contain nicotine or tobacco, including cigarettes, e-cigarettes, and chewing tobacco. These can make your symptoms worse. If you need help quitting, ask your doctor.  Wear loose clothes. Do not wear anything tight around your waist.  Raise (elevate) the head of your bed about 6  inches (15 cm) when you sleep.  Try to lower your stress. If you need help doing this, ask your doctor. General instructions  Pay attention to any changes in your symptoms.  Take over-the-counter and prescription medicines only as told by your doctor. ? Do not take aspirin, ibuprofen, or other NSAIDs unless your doctor says it is okay. ? Stop medicines only as told by your doctor.  Keep all follow-up visits as told by your doctor. This is important. Contact a doctor if:  You have new symptoms.  You lose weight and you do not know why it is happening.  You have trouble swallowing, or it hurts to swallow.  You have wheezing or a cough that keeps happening.  Your symptoms do not get better with treatment.  You have heartburn often for more than  2 weeks. Get help right away if:  You have pain in your arms, neck, jaw, teeth, or back.  You feel sweaty, dizzy, or light-headed.  You have chest pain or shortness of breath.  You throw up (vomit) and your throw up looks like blood or coffee grounds.  Your poop (stool) is bloody or black. These symptoms may represent a serious problem that is an emergency. Do not wait to see if the symptoms will go away. Get medical help right away. Call your local emergency services (911 in the [place] Do not drive yourself to the hospital. Summary  Heartburn is a type of pain that can happen in the throat or chest. It can feel like a burning pain. It may also cause a bad, acid-like taste in the mouth.  You may need to avoid certain foods and drinks to help your symptoms. Ask your doctor what foods and drinks you should avoid.  Take over-the-counter and prescription medicines only as told by your doctor. Do not take aspirin, ibuprofen, or other NSAIDs unless your doctor told you to do so.  Contact your doctor if your symptoms do not get better or they get worse. This information is not intended to replace advice given to you by your health care  provider. Make sure you discuss any questions you have with your health care provider. Document Revised: 12/22/2017 Document Reviewed: 12/22/2017 Elsevier Patient Education  [place].

## 2019-12-07 DIAGNOSIS — M79676 Pain in unspecified toe(s): Secondary | ICD-10-CM | POA: Diagnosis not present

## 2019-12-07 DIAGNOSIS — B351 Tinea unguium: Secondary | ICD-10-CM | POA: Diagnosis not present

## 2019-12-07 DIAGNOSIS — E1142 Type 2 diabetes mellitus with diabetic polyneuropathy: Secondary | ICD-10-CM | POA: Diagnosis not present

## 2019-12-07 DIAGNOSIS — L84 Corns and callosities: Secondary | ICD-10-CM | POA: Diagnosis not present

## 2019-12-24 DIAGNOSIS — E113553 Type 2 diabetes mellitus with stable proliferative diabetic retinopathy, bilateral: Secondary | ICD-10-CM | POA: Diagnosis not present

## 2019-12-24 DIAGNOSIS — H401133 Primary open-angle glaucoma, bilateral, severe stage: Secondary | ICD-10-CM | POA: Diagnosis not present

## 2019-12-24 DIAGNOSIS — Z794 Long term (current) use of insulin: Secondary | ICD-10-CM | POA: Diagnosis not present

## 2019-12-27 ENCOUNTER — Other Ambulatory Visit: Payer: Self-pay | Admitting: Family Medicine

## 2020-01-04 ENCOUNTER — Encounter (INDEPENDENT_AMBULATORY_CARE_PROVIDER_SITE_OTHER): Payer: Medicare Other | Admitting: Ophthalmology

## 2020-01-11 ENCOUNTER — Encounter (INDEPENDENT_AMBULATORY_CARE_PROVIDER_SITE_OTHER): Payer: Medicare Other | Admitting: Ophthalmology

## 2020-01-11 ENCOUNTER — Other Ambulatory Visit: Payer: Self-pay

## 2020-01-11 ENCOUNTER — Other Ambulatory Visit: Payer: Medicare Other

## 2020-01-11 ENCOUNTER — Encounter (INDEPENDENT_AMBULATORY_CARE_PROVIDER_SITE_OTHER): Payer: Self-pay

## 2020-01-11 DIAGNOSIS — Z794 Long term (current) use of insulin: Secondary | ICD-10-CM

## 2020-01-11 DIAGNOSIS — E1159 Type 2 diabetes mellitus with other circulatory complications: Secondary | ICD-10-CM | POA: Diagnosis not present

## 2020-01-11 DIAGNOSIS — I152 Hypertension secondary to endocrine disorders: Secondary | ICD-10-CM

## 2020-01-11 DIAGNOSIS — E1169 Type 2 diabetes mellitus with other specified complication: Secondary | ICD-10-CM

## 2020-01-11 DIAGNOSIS — I10 Essential (primary) hypertension: Secondary | ICD-10-CM | POA: Diagnosis not present

## 2020-01-11 DIAGNOSIS — E785 Hyperlipidemia, unspecified: Secondary | ICD-10-CM

## 2020-01-11 DIAGNOSIS — E113553 Type 2 diabetes mellitus with stable proliferative diabetic retinopathy, bilateral: Secondary | ICD-10-CM

## 2020-01-11 LAB — BAYER DCA HB A1C WAIVED: HB A1C (BAYER DCA - WAIVED): 6.9 % (ref <7.0)

## 2020-01-12 ENCOUNTER — Encounter: Payer: Self-pay | Admitting: Family Medicine

## 2020-01-12 ENCOUNTER — Ambulatory Visit (INDEPENDENT_AMBULATORY_CARE_PROVIDER_SITE_OTHER): Payer: Medicare Other | Admitting: Family Medicine

## 2020-01-12 ENCOUNTER — Other Ambulatory Visit: Payer: Self-pay

## 2020-01-12 VITALS — BP 102/58 | HR 90 | Temp 97.5°F | Ht 73.0 in | Wt 272.4 lb

## 2020-01-12 DIAGNOSIS — I10 Essential (primary) hypertension: Secondary | ICD-10-CM

## 2020-01-12 DIAGNOSIS — E113553 Type 2 diabetes mellitus with stable proliferative diabetic retinopathy, bilateral: Secondary | ICD-10-CM

## 2020-01-12 DIAGNOSIS — Z79899 Other long term (current) drug therapy: Secondary | ICD-10-CM

## 2020-01-12 DIAGNOSIS — F411 Generalized anxiety disorder: Secondary | ICD-10-CM | POA: Diagnosis not present

## 2020-01-12 DIAGNOSIS — I152 Hypertension secondary to endocrine disorders: Secondary | ICD-10-CM

## 2020-01-12 DIAGNOSIS — E1159 Type 2 diabetes mellitus with other circulatory complications: Secondary | ICD-10-CM | POA: Diagnosis not present

## 2020-01-12 DIAGNOSIS — Z7689 Persons encountering health services in other specified circumstances: Secondary | ICD-10-CM

## 2020-01-12 DIAGNOSIS — E1169 Type 2 diabetes mellitus with other specified complication: Secondary | ICD-10-CM | POA: Diagnosis not present

## 2020-01-12 DIAGNOSIS — Z794 Long term (current) use of insulin: Secondary | ICD-10-CM

## 2020-01-12 DIAGNOSIS — E785 Hyperlipidemia, unspecified: Secondary | ICD-10-CM

## 2020-01-12 LAB — CMP14+EGFR
ALT: 11 IU/L (ref 0–44)
AST: 14 IU/L (ref 0–40)
Albumin/Globulin Ratio: 1.2 (ref 1.2–2.2)
Albumin: 4.2 g/dL (ref 3.8–4.8)
Alkaline Phosphatase: 72 IU/L (ref 48–121)
BUN/Creatinine Ratio: 23 (ref 10–24)
BUN: 25 mg/dL (ref 8–27)
Bilirubin Total: 0.2 mg/dL (ref 0.0–1.2)
CO2: 20 mmol/L (ref 20–29)
Calcium: 9.4 mg/dL (ref 8.6–10.2)
Chloride: 108 mmol/L — ABNORMAL HIGH (ref 96–106)
Creatinine, Ser: 1.07 mg/dL (ref 0.76–1.27)
GFR calc Af Amer: 81 mL/min/{1.73_m2} (ref >59)
GFR calc non Af Amer: 70 mL/min/{1.73_m2} (ref >59)
Globulin, Total: 3.6 g/dL (ref 1.5–4.5)
Glucose: 127 mg/dL — ABNORMAL HIGH (ref 65–99)
Potassium: 4.1 mmol/L (ref 3.5–5.2)
Sodium: 138 mmol/L (ref 134–144)
Total Protein: 7.8 g/dL (ref 6.0–8.5)

## 2020-01-12 LAB — CBC WITH DIFFERENTIAL/PLATELET
Basophils Absolute: 0 10*3/uL (ref 0.0–0.2)
Basos: 1 %
EOS (ABSOLUTE): 0.1 10*3/uL (ref 0.0–0.4)
Eos: 2 %
Hematocrit: 40.5 % (ref 37.5–51.0)
Hemoglobin: 13 g/dL (ref 13.0–17.7)
Immature Grans (Abs): 0 10*3/uL (ref 0.0–0.1)
Immature Granulocytes: 0 %
Lymphocytes Absolute: 2.1 10*3/uL (ref 0.7–3.1)
Lymphs: 33 %
MCH: 25.9 pg — ABNORMAL LOW (ref 26.6–33.0)
MCHC: 32.1 g/dL (ref 31.5–35.7)
MCV: 81 fL (ref 79–97)
Monocytes Absolute: 0.4 10*3/uL (ref 0.1–0.9)
Monocytes: 7 %
Neutrophils Absolute: 3.8 10*3/uL (ref 1.4–7.0)
Neutrophils: 57 %
Platelets: 244 10*3/uL (ref 150–450)
RBC: 5.02 x10E6/uL (ref 4.14–5.80)
RDW: 15.9 % — ABNORMAL HIGH (ref 11.6–15.4)
WBC: 6.5 10*3/uL (ref 3.4–10.8)

## 2020-01-12 LAB — LIPID PANEL
Chol/HDL Ratio: 4 ratio (ref 0.0–5.0)
Cholesterol, Total: 124 mg/dL (ref 100–199)
HDL: 31 mg/dL — ABNORMAL LOW (ref >39)
LDL Chol Calc (NIH): 71 mg/dL (ref 0–99)
Triglycerides: 123 mg/dL (ref 0–149)
VLDL Cholesterol Cal: 22 mg/dL (ref 5–40)

## 2020-01-12 MED ORDER — ALPRAZOLAM 1 MG PO TABS: 0.5000 mg | ORAL_TABLET | Freq: Three times a day (TID) | ORAL | 0 refills | Status: DC | PRN

## 2020-01-12 MED ORDER — MIRTAZAPINE 15 MG PO TABS: 15.0000 mg | ORAL_TABLET | Freq: Every day | ORAL | 0 refills | Status: DC

## 2020-01-12 MED ORDER — FREESTYLE LIBRE 14 DAY SENSOR MISC: 1.0000 [IU] | Freq: Every day | 12 refills | Status: DC

## 2020-01-12 MED ORDER — FREESTYLE LIBRE READER DEVI: 1.0000 [IU] | Freq: Every day | 1 refills | Status: DC

## 2020-01-12 NOTE — Progress Notes (Addendum)
Subjective: CC: est care, DM2, HTn, HLD, GAD PCP: [name], DO [name] is a 69 y.o. male presenting to clinic today for:  1. GAD Patient reports longstanding history of generalized anxiety disorder/situational anxiety disorder and panic disorder.  He was previously seen by a psychiatrist in [place] but he was released to PCP care given stability.  He does report some depressive symptoms but feels that this may be related to lapse in his Xanax.  His last dose was about a week and a half ago.  He does report increased situational anxiety.  Typically he uses this medication before certain events which involve large crowds, particularly church.  Denies excessive daytime sedation, falls, visual hallucinations, memory loss.  No history of SI or HI.  No history of hospitalization for mental health disorder.  He has not had an alcoholic drink in over 10 years.  2. Type 2 Diabetes w/ HTN, HLD:  Patient reports compliance with Metformin 1000 mg twice daily, Humalog 15 units with meals, 3 times daily, Levemir 70 units twice daily and Jardiance 25 mg daily.  He also takes Norvasc 5 mg daily, lisinopril 40, Crestor 20 mg daily.  No chest pain, shortness of breath, falls.  He has quite a bit of difficulty with vision but notes that he has been closely followed by his eye doctors in [place].  He has an upcoming glaucoma surgery in July.  He does have difficulty poking his finger as he is developing calluses.  He is testing blood sugar 4-6 times daily.  He is making adjustments based on blood sugar readings.  He is injecting insulin 5 times daily.  Last eye exam: UTD Last foot exam: UTD Last A1c:  Lab Results  Component Value Date   HGBA1C 6.9 01/11/2020   Nephropathy screen indicated?:  On ACE inhibitor Last flu, zoster and/or pneumovax:  Immunization History  Administered Date(s) Administered  . Fluad Quad(high Dose 65+) 04/13/2019  . Pneumococcal Conjugate-13 04/23/2018    . Pneumococcal Polysaccharide-23 12/28/2015  . Zoster 01/04/2016     ROS: Per HPI  No Known Allergies Past Medical History:  Diagnosis Date  . Anxiety   . Arthritis   . Diabetes mellitus without complication ([place])   . GERD (gastroesophageal reflux disease)   . Hypercholesteremia   . Hypertension     Current Outpatient Medications:  .  ACCU-CHEK AVIVA PLUS test strip, USE TO MONITOR BLOOD GLUCOSE 3 TIME(S) DAILY. DX E11.9, Disp: 300 strip, Rfl: 3 .  Accu-Chek Softclix Lancets lancets, Check BS TID Dx E11.9, Disp: 100 each, Rfl: 12 .  acetaZOLAMIDE (DIAMOX) 500 MG capsule, Take 1 capsule by mouth 2 (two) times a day., Disp: , Rfl:  .  ALPRAZolam (XANAX) 1 MG tablet, TAKE 1 TABLET (1 MG TOTAL) BY MOUTH 3 (THREE) TIMES DAILY AS NEEDED FOR ANXIETY. (Patient not taking: Reported on 11/23/2019), Disp: 90 tablet, Rfl: 0 .  amLODipine (NORVASC) 5 MG tablet, TAKE 1 TABLET BY MOUTH EVERY DAY, Disp: 90 tablet, Rfl: 0 .  dorzolamide (TRUSOPT) 2 % ophthalmic solution, Apply to eye., Disp: , Rfl:  .  empagliflozin (JARDIANCE) 25 MG TABS tablet, Take 25 mg by mouth daily before breakfast., Disp: 90 tablet, Rfl: 0 .  famotidine (PEPCID) 20 MG tablet, Take 1 tablet (20 mg total) by mouth 2 (two) times daily., Disp: 60 tablet, Rfl: 2 .  Insulin Detemir (LEVEMIR FLEXPEN) 100 UNIT/ML Pen, Inject 70 Units into the skin 2 (two) times daily. , Disp: ,  Rfl:  .  insulin lispro (HUMALOG KWIKPEN) 100 UNIT/ML KwikPen, Inject 15 Units into the skin 3 (three) times daily., Disp: , Rfl:  .  Insulin Pen Needle (B-D ULTRAFINE III SHORT PEN) 31G X 8 MM MISC, Use to give insulin daily Dx E11.9, Disp: 100 each, Rfl: 3 .  lactulose (CHRONULAC) 10 GM/15ML solution, Take 30 mLs (20 g total) by mouth daily as needed for up to 8 doses for mild constipation., Disp: 240 mL, Rfl: 6 .  latanoprost (XALATAN) 0.005 % ophthalmic solution, Place 1 drop into both eyes at bedtime., Disp: , Rfl:  .  lisinopril (ZESTRIL) 40 MG tablet,  TAKE 1 TABLET BY MOUTH EVERY DAY, Disp: 90 tablet, Rfl: 0 .  meclizine (ANTIVERT) 12.5 MG tablet, Take 1 tablet (12.5 mg total) by mouth 3 (three) times daily as needed for dizziness., Disp: 30 tablet, Rfl: 0 .  metFORMIN (GLUCOPHAGE-XR) 500 MG 24 hr tablet, TAKE 2 TABLETS BY MOUTH TWICE A DAY WITH FOOD, Disp: 360 tablet, Rfl: 1 .  rosuvastatin (CRESTOR) 20 MG tablet, TAKE 1 TABLET BY MOUTH EVERYDAY AT BEDTIME, Disp: 90 tablet, Rfl: 1 Social History   Socioeconomic History  . Marital status: Divorced    Spouse name: Not on file  . Number of children: Not on file  . Years of education: Not on file  . Highest education level: Not on file  Occupational History  . Not on file  Tobacco Use  . Smoking status: Former Smoker    Packs/day: 0.50    Years: 41.00    Pack years: 20.50    Types: Cigarettes    Quit date: 02/29/2012    Years since quitting: 7.8  . Smokeless tobacco: Never Used  Substance and Sexual Activity  . Alcohol use: No    Comment: quit 2012  . Drug use: No    Comment: quit in 2012  . Sexual activity: Yes    Birth control/protection: None  Other Topics Concern  . Not on file  Social History Narrative  . Not on file   Social Determinants of Health   Financial Resource Strain:   . Difficulty of Paying Living Expenses:   Food Insecurity:   . Worried About Charity fundraiser in the Last Year:   . Arboriculturist in the Last Year:   Transportation Needs:   . Film/video editor (Medical):   Marland Kitchen Lack of Transportation (Non-Medical):   Physical Activity:   . Days of Exercise per Week:   . Minutes of Exercise per Session:   Stress:   . Feeling of Stress :   Social Connections:   . Frequency of Communication with Friends and Family:   . Frequency of Social Gatherings with Friends and Family:   . Attends Religious Services:   . Active Member of Clubs or Organizations:   . Attends Archivist Meetings:   Marland Kitchen Marital Status:   Intimate Partner Violence:     . Fear of Current or Ex-Partner:   . Emotionally Abused:   Marland Kitchen Physically Abused:   . Sexually Abused:    Family History  Problem Relation Age of Onset  . Diabetes Mother   . Glaucoma Mother     Objective: Office vital signs reviewed. BP (!) 102/58   Pulse 90   Temp (!) 97.5 F (36.4 C)   Ht 6\' 1"  (1.854 m)   Wt 272 lb 6.4 oz (123.6 kg)   SpO2 96%   BMI 35.94 kg/m  Physical Examination:  General: Awake, alert, well nourished, No acute distress HEENT: Normal; he has discoloration of the right cornea Cardio: regular rate and rhythm, S1S2 heard, no murmurs appreciated Pulm: clear to auscultation bilaterally, no wheezes, rhonchi or rales; normal work of breathing on room air Extremities: warm, well perfused, No edema, cyanosis or clubbing; +2 pulses bilaterally MSK: Uses cane for ambulation (recently injured back) Neuro: No tremor.  Alert and oriented x3. Psych: Mood stable, speech normal, affect appropriate, pleasant and interactive. Depression screen [facility] 2/9 01/12/2020 01/12/2020 11/23/2019  Decreased Interest 0 0 0  Down, Depressed, Hopeless 3 0 0  PHQ - 2 Score 3 0 0  Altered sleeping 3 - -  Tired, decreased energy 3 - -  Change in appetite 0 - -  Feeling bad or failure about yourself  0 - -  Trouble concentrating 3 - -  Moving slowly or fidgety/restless 3 - -  Suicidal thoughts 0 - -  PHQ-9 Score 15 - -  Difficult doing work/chores Somewhat difficult - -    GAD 7 : Generalized Anxiety Score 01/12/2020 04/06/2019  Nervous, Anxious, on Edge 3 2  Control/stop worrying 1 1  Worry too much - different things 1 2  Trouble relaxing 3 1  Restless 1 0  Easily annoyed or irritable 1 1  Afraid - awful might happen 0 1  Total GAD 7 Score 10 8  Anxiety Difficulty Somewhat difficult -    Assessment/ Plan: 69 y.o. male   1. GAD (generalized anxiety disorder) Stable.  Mirtazapine added.  We discussed the risks of benzodiazepine use.  I encouraged him to use as sparingly as  possible.  The national narcotic database was reviewed and there were no red flags.  UDS and controlled substance contract updated per office policy - ToxASSURE Select 13 (MW), Urine - ALPRAZolam (XANAX) 1 MG tablet; Take 0.5-1 tablets (0.5-1 mg total) by mouth 3 (three) times daily as needed for anxiety.  Dispense: 90 tablet; Refill: 0 - mirtazapine (REMERON) 15 MG tablet; Take 1 tablet (15 mg total) by mouth at bedtime. (for anxiety, depression, sleep)  Dispense: 90 tablet; Refill: 0  2. Chronic prescription benzodiazepine use Not demonstrating any evidence of withdrawal despite having taken medication more than 1 week ago - ToxASSURE Select 13 (MW), Urine  3. Controlled substance agreement signed - ToxASSURE Select 13 (MW), Urine  4. 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  Having quite a bit of difficulty with finger pricks.  Switch over to freestyle libre.  We will see if we can get this gentleman in for a demonstration.  This has been ordered - Continuous Blood Gluc Sensor (FREESTYLE LIBRE 14 DAY SENSOR) MISC; 1 Units by Does not apply route daily. UAD to check blood sugars R73.09  Dispense: 2 each; Refill: 12 - Continuous Blood Gluc Receiver (FREESTYLE LIBRE READER) DEVI; 1 Units by Does not apply route daily. UAD to test BGs daily. Dx E11.40  Dispense: 1 each; Refill: 1  5. Hyperlipidemia associated with type 2 diabetes mellitus (HCC) Continue statin  6. Hypertension associated with diabetes ([place]) Well-controlled  7. Morbid obesity ([place])  8. Establishing care with new doctor, encounter for   No orders of the defined types were placed in this encounter.  No orders of the defined types were placed in this encounter.    [name], DO [place] [phone]

## 2020-01-12 NOTE — Patient Instructions (Signed)

## 2020-01-14 ENCOUNTER — Ambulatory Visit (INDEPENDENT_AMBULATORY_CARE_PROVIDER_SITE_OTHER): Payer: Medicare Other | Admitting: Pharmacist

## 2020-01-14 ENCOUNTER — Other Ambulatory Visit: Payer: Self-pay

## 2020-01-14 ENCOUNTER — Encounter: Payer: Self-pay | Admitting: Pharmacist

## 2020-01-14 DIAGNOSIS — Z794 Long term (current) use of insulin: Secondary | ICD-10-CM | POA: Diagnosis not present

## 2020-01-14 DIAGNOSIS — E113553 Type 2 diabetes mellitus with stable proliferative diabetic retinopathy, bilateral: Secondary | ICD-10-CM | POA: Diagnosis not present

## 2020-01-14 MED ORDER — FREESTYLE LIBRE READER DEVI: 1 refills | Status: DC

## 2020-01-14 MED ORDER — FREESTYLE LIBRE 14 DAY SENSOR MISC: 12 refills | Status: DC

## 2020-01-14 NOTE — Progress Notes (Signed)
    01/14/2020 Name: [name] MRN: [id number] DOB: 03-21-1951   S:  72 YOM Presents for diabetes evaluation, education, and management Patient was referred and last seen by Primary Care Provider on 01/12/20.  He is interested in freestyle libre CGM.  Insurance coverage/medication affordability: [facility] MEDICARE  Patient reports adherence with medications. . Current diabetes medications include: levemir, HUMALOG, jardiance, metformin . Current hypertension medications include: lisinopril, amlodipine Goal 130/80 . Current hyperlipidemia medications include: rosuvastatin   Patient denies hypoglycemic events.   Patient reported dietary habits: Eats 2-3 meals/day  Discussed meal planning options and Plate method for health eating  Avoid sugary drinks and desserts  Incorporate balanced protein, non starchy veggies, 1 serving of carbohydrate  Increase water intake  Increase physical activity as able.  Patient-reported exercise habits: n/a  Patient denies nocturia (nighttime urination).  Patient denies neuropathy (nerve pain).  Patient reports visual changes.   O:  Lab Results  Component Value Date   HGBA1C 6.9 01/11/2020   Lipid Panel     Component Value Date/Time   CHOL 124 01/11/2020 0937   TRIG 123 01/11/2020 0937   HDL 31 (L) 01/11/2020 0937   CHOLHDL 4.0 01/11/2020 0937   LDLCALC 71 01/11/2020 0937    Home fasting blood sugars: 110-120  2 hour post-meal/random blood sugars: n/a.    A/P:  Diabetes t2dm currently CONTROLLED. Patient is able to verbalize appropriate hypoglycemia management plan. Patient is adherent with medication.   -Continued basal insulin LEVEMIR (insulin GLARGINE).   CONSIDER SWITCHING TO LONGER ACTING INSULIN, I.E. TRESIBA AT NEXT VISIT  -Continued  rapid insulin HUMALOG (insulin LISPRO)  -Continued SGLT2-I JARDIANCE (generic name EMPAGLIFLOZIN)   -CONTINUE METFORMIN  -LIBRE2 CGM applied to patient's right arm (2 wk supply).  Set  up device.    RXs sent to [facility] [employer] to see if covered/discover price  Libre2 kit given to patient  -Extensively discussed pathophysiology of diabetes, recommended lifestyle interventions, dietary effects on blood sugar control  -Counseled on s/sx of and management of hypoglycemia  -Next A1C anticipated 6-12 MONTHS   Written patient instructions provided.  Total time in face to face counseling 30 minutes.   Follow up Pharmacist Clinic Visit [place] [facility].   Regina Eck, PharmD, BCPS Clinical Pharmacist, [place]  II Phone [phone]

## 2020-01-16 LAB — TOXASSURE SELECT 13 (MW), URINE

## 2020-01-26 ENCOUNTER — Telehealth: Payer: Self-pay | Admitting: Pharmacist

## 2020-01-26 NOTE — Telephone Encounter (Signed)
Call placed to [facility] (supplier of libre2) regarding libre2 They did not have patient's phone number on file  Requested they call patient to state application is still in process for [employer]

## 2020-01-28 ENCOUNTER — Other Ambulatory Visit: Payer: Self-pay

## 2020-01-28 ENCOUNTER — Ambulatory Visit (INDEPENDENT_AMBULATORY_CARE_PROVIDER_SITE_OTHER): Payer: Medicare Other | Admitting: Pharmacist

## 2020-01-28 ENCOUNTER — Encounter: Payer: Self-pay | Admitting: Pharmacist

## 2020-01-28 VITALS — BP 113/68 | HR 80

## 2020-01-28 DIAGNOSIS — E113553 Type 2 diabetes mellitus with stable proliferative diabetic retinopathy, bilateral: Secondary | ICD-10-CM | POA: Diagnosis not present

## 2020-01-28 DIAGNOSIS — Z794 Long term (current) use of insulin: Secondary | ICD-10-CM | POA: Diagnosis not present

## 2020-01-28 NOTE — Progress Notes (Signed)
    01/28/2020 Name: [name] MRN: [id number] DOB: [birth year]   S:  28 YOM Presents for diabetes evaluation, education, and management Patient was referred and last seen by Primary Care Provider on 01/12/20.  He is interested in freestyle libre CGM.  We are working with insurance to get approved  Insurance coverage/medication affordability: [place]  Patient reports adherence with medications.  Current diabetes medications include: levemir, HUMALOG, jardiance, metformin  Current hypertension medications include: lisinopril, amlodipine Goal 130/80  Current hyperlipidemia medications include: rosuvastatin   Patient denies hypoglycemic events.   Patient reported dietary habits: Eats 2-3 meals/day  Discussed meal planning options and Plate method for health eating  Avoid sugary drinks and desserts  Incorporate balanced protein, non starchy veggies, 1 serving of carbohydrate  Increase water intake  Increase physical activity as able.  Patient-reported exercise habits: n/a  Patient denies nocturia (nighttime urination).  Patient denies neuropathy (nerve pain).  Patient reports visual changes.    O:  Lab Results  Component Value Date   HGBA1C 6.9 01/11/2020    Vitals:   01/28/20 0851  BP: 113/68  Pulse: 80    Lipid Panel     Component Value Date/Time   CHOL 124 01/11/2020 0937   TRIG 123 01/11/2020 0937   HDL 31 (L) 01/11/2020 0937   CHOLHDL 4.0 01/11/2020 0937   LDLCALC 71 01/11/2020 0937     Home fasting blood sugars: 114, 116, 143  2 hour post-meal/random blood sugars: 180-230     A/P:  Diabetes T2DM currently CONTROLLED. Patient is having higher post-prandials, however he has been eating ice cream more recently.  Patient is able to verbalize appropriate hypoglycemia management plan. Patient is adherent with medication.   -Continued basal insulin LEVEMIR (insulin GLARGINE) TWICE DAILY.              CONSIDER SWITCHING TO LONGER ACTING  INSULIN, I.E. TRESIBA AT NEXT VISIT  Patient hesitant to switch at this time, will continue to explore.  Do no wish to overwhelm regimen  -Continued  rapid insulin HUMALOG (insulin LISPRO)  -Continued SGLT2-I JARDIANCE (generic name EMPAGLIFLOZIN)   -CONTINUE METFORMIN  -Another LIBRE2 CGM applied to patient's right arm (2 wk supply).  New sensor set up              RXs sent to [facility] to see if covered/discover price             Libre2 kit given to patient  -Extensively discussed pathophysiology of diabetes, recommended lifestyle interventions, dietary effects on blood sugar control  -Counseled on s/sx of and management of hypoglycemia  -Next A1C anticipated 6-12 MONTHS   Written patient instructions provided.  Total time in face to face counseling 30 minutes.   Follow up PCP Clinic Visit ON 04/14/20.    Regina Eck, PharmD, BCPS Clinical Pharmacist, [place]  II Phone [phone]

## 2020-01-31 ENCOUNTER — Ambulatory Visit (INDEPENDENT_AMBULATORY_CARE_PROVIDER_SITE_OTHER): Payer: Medicare Other | Admitting: Pharmacist

## 2020-01-31 ENCOUNTER — Other Ambulatory Visit: Payer: Self-pay

## 2020-01-31 DIAGNOSIS — E113553 Type 2 diabetes mellitus with stable proliferative diabetic retinopathy, bilateral: Secondary | ICD-10-CM

## 2020-01-31 DIAGNOSIS — Z794 Long term (current) use of insulin: Secondary | ICD-10-CM | POA: Diagnosis not present

## 2020-01-31 NOTE — Progress Notes (Signed)
01/31/2020 °Name: [name] MRN: [id number] DOB: 01/18/1951 ° ° °S:  69 YOM Presents for diabetes evaluation, education, and management °Patient was referred and last seen by Primary Care Provider on 01/12/20.  He is interested in freestyle libre CGM.  We are working with insurance to get approved.  Latest libre sensor had fallen off and was not working per patient report °  °Insurance coverage/medication affordability: [employer] MEDICARE °  °Patient reports adherence with medications. °· Current diabetes medications include: levemir, HUMALOG, jardiance, metformin °· Current hypertension medications include: lisinopril, amlodipine °Goal 130/80 °· Current hyperlipidemia medications include: rosuvastatin °  °Patient denies hypoglycemic events. °  °Patient reported dietary habits: Eats 2 meals/day °· Discussed meal planning options and Plate method for health eating °· Avoid sugary drinks and desserts °· Incorporate balanced protein, non starchy veggies, 1 serving of carbohydrate °· Increase water intake °· Increase physical activity as able. °  °Patient-reported exercise habits: n/a °  °Patient denies nocturia (nighttime urination). °  °Patient denies neuropathy (nerve pain). °  °Patient reports visual changes. °O: ° °Lab Results  °Component Value Date  ° HGBA1C 6.9 01/11/2020  ° ° °Lipid Panel ° °   °Component Value Date/Time  ° CHOL 124 01/11/2020 0937  ° TRIG 123 01/11/2020 0937  ° HDL 31 (L) 01/11/2020 0937  ° CHOLHDL 4.0 01/11/2020 0937  ° LDLCALC 71 01/11/2020 0937  ° ° °Home fasting blood sugars: 110-140 ° 2 hour post-meal/random blood sugars: 180-230 °   °  °A/P: °  °Diabetes T2DM currently CONTROLLED. Patient is having higher post-prandials, however he has been eating ice cream more recently.  Patient is able to verbalize appropriate hypoglycemia management plan. Patient is adherent with medication.  °  °-Continued basal insulin LEVEMIR (insulin GLARGINE) TWICE DAILY.  °            CONSIDER SWITCHING TO LONGER  ACTING INSULIN, I.E. TRESIBA AT NEXT VISIT °            Patient hesitant to switch at this time, will continue to explore.  Do no wish to overwhelm regimen °  °-Continued  rapid insulin HUMALOG (insulin LISPRO) °  °-Continued SGLT2-I JARDIANCE (generic name EMPAGLIFLOZIN)  °  °-CONTINUE METFORMIN °  °-Another LIBRE2 CGM applied to patient's right arm (2 wk supply).  New sensor set up.  Previous sensor was not working °            RXs sent to [facility] to see if covered/discover price °            Libre2 kit given to patient °  °-Extensively discussed pathophysiology of diabetes, recommended lifestyle interventions, dietary effects on blood sugar control °  °-Counseled on s/sx of and management of hypoglycemia °  °-Next A1C anticipated 6 MONTHS  °  °Written patient instructions provided.  Total time in face to face counseling 30 minutes.  °  °Follow up PCP Clinic Visit ON 04/14/20.  °  °Julie Dattero Pruitt, PharmD, BCPS °Clinical Pharmacist, [facility] °[place]  II Phone [phone] ° °

## 2020-02-09 ENCOUNTER — Telehealth: Payer: Self-pay | Admitting: Pharmacist

## 2020-02-09 ENCOUNTER — Telehealth: Payer: Self-pay | Admitting: Family Medicine

## 2020-02-09 DIAGNOSIS — E113553 Type 2 diabetes mellitus with stable proliferative diabetic retinopathy, bilateral: Secondary | ICD-10-CM

## 2020-02-09 DIAGNOSIS — Z794 Long term (current) use of insulin: Secondary | ICD-10-CM

## 2020-02-09 MED ORDER — FREESTYLE LIBRE READER DEVI: 1 refills | Status: DC

## 2020-02-09 MED ORDER — FREESTYLE LIBRE 14 DAY SENSOR MISC: 12 refills | Status: DC

## 2020-02-09 NOTE — Telephone Encounter (Signed)
Libre denied via [facility] due to insurance--will attempt [place] to see if covered  RXs sent electronically All clinical notes, insurance card and RXs faxed to [employer]

## 2020-02-09 NOTE — Telephone Encounter (Signed)
Pt called stating that his freestyle came off of his arm that checks his glucose and has not received his other ones that he has been waiting to get. Wants to speak with the pharmacist.

## 2020-02-10 ENCOUNTER — Ambulatory Visit: Payer: Medicare Other | Admitting: Pharmacist

## 2020-02-10 NOTE — Telephone Encounter (Signed)
appt scheduled for 02/10/20 @0830 

## 2020-02-15 ENCOUNTER — Other Ambulatory Visit: Payer: Self-pay | Admitting: *Deleted

## 2020-02-15 MED ORDER — METFORMIN HCL ER 500 MG PO TB24: ORAL_TABLET | ORAL | 0 refills | Status: DC

## 2020-02-21 ENCOUNTER — Telehealth: Payer: Self-pay | Admitting: Family Medicine

## 2020-02-21 NOTE — Telephone Encounter (Signed)
Pt called stating that he had a message from someone at [facility] regarding his blood glucose order. Says the message said something about needing some info from patient but patient says he has tried calling several times and cant get in touch with the right person. Wants to know if we have heard anything from [facility] or can we call them?

## 2020-02-21 NOTE — Telephone Encounter (Signed)
Call placed to [facility] to check on status of CGM Placed on hold for 20+ min with on resolution Will fax insurance info to [facility] to attempt to see if CGM will be processed

## 2020-02-22 NOTE — Telephone Encounter (Signed)
Will attempt to go through [facility] based on insurance   [facility] on 02/10/20 YQM#[phone]  Phone # [phone]  **UPDATE**  Patient needs to call Ten[employer] back at [phone]  Left message for patient to call PharmD back

## 2020-02-22 NOTE — Telephone Encounter (Signed)
Call placed to patient --left VM for patient to call pharmD back Patient's application has been approved through [employer] for [employer]

## 2020-02-22 NOTE — Telephone Encounter (Signed)
Pt wants to let Almyra Free know that his application for [employer]ia was approved.

## 2020-02-23 DIAGNOSIS — E1140 Type 2 diabetes mellitus with diabetic neuropathy, unspecified: Secondary | ICD-10-CM | POA: Diagnosis not present

## 2020-02-23 DIAGNOSIS — Z794 Long term (current) use of insulin: Secondary | ICD-10-CM | POA: Diagnosis not present

## 2020-02-29 ENCOUNTER — Ambulatory Visit: Payer: Medicare Other | Admitting: Pharmacist

## 2020-03-01 ENCOUNTER — Ambulatory Visit (INDEPENDENT_AMBULATORY_CARE_PROVIDER_SITE_OTHER): Payer: Medicare Other | Admitting: Pharmacist

## 2020-03-01 ENCOUNTER — Other Ambulatory Visit: Payer: Self-pay

## 2020-03-01 DIAGNOSIS — E119 Type 2 diabetes mellitus without complications: Secondary | ICD-10-CM | POA: Diagnosis not present

## 2020-03-01 NOTE — Progress Notes (Signed)
    03/01/2020 Name: [name] MRN: [id number] DOB: 1950-08-30   S:69 YOM Presents for diabetes evaluation, education, and management Patient was referred and last seen by Primary Care Provider on6/9/21.He is interested in freestyle libre CGM.We are working with insurance to get approved.  Latest libre sensor had fallen off and was not working per patient report  Insurance coverage/medication [place] with medications.  Current diabetes medications include:levemir, HUMALOG, jardiance, metformin  Current hypertension medications include:lisinopril, amlodipine Goal 130/80  Current hyperlipidemia medications include:rosuvastatin  Patientdenieshypoglycemic events.   Patient denies hypoglycemic events.   Patient reported dietary habits: Eats 2-3 meals/day Discussed meal planning options and Plate method for healthy eating . Avoid sugary drinks and desserts . Incorporate balanced protein, non starchy veggies, 1 serving of carbohydrate with each meal . Increase water intake . Increase physical activity as able  O:  Lab Results  Component Value Date   HGBA1C 6.9 01/11/2020     Lipid Panel     Component Value Date/Time   CHOL 124 01/11/2020 0937   TRIG 123 01/11/2020 0937   HDL 31 (L) 01/11/2020 0937   CHOLHDL 4.0 01/11/2020 0937   LDLCALC 71 01/11/2020 0937     Home fasting blood sugars: 130-150  2 hour post-meal/random blood sugars: 180s.   A/P:  DiabetesT2DMcurrently CONTROLLED.Patient is able to verbalize appropriate hypoglycemia management plan. Patientisadherent with medication.    Libre applied for patient (patient's own supply)  3- month supply from [facility]  Will adjust insulin at next visit on 8/16  -Continuedbasal insulin LEVEMIR(insulin GLARGINE)TWICE DAILY. CONSIDER SWITCHING TO LONGER ACTING INSULIN, I.E. TRESIBA AT NEXT VISIT Patient hesitant to  switch at this time, will continue to explore. Do no wish to overwhelm regimen  -Continuedrapid insulin HUMALOG(insulin LISPRO)  -ContinuedSGLT2-I JARDIANCE(generic name EMPAGLIFLOZIN)  -CONTINUE METFORMIN  -Extensively discussed pathophysiology of diabetes, recommended lifestyle interventions, dietary effects on blood sugar control  -Counseled on s/sx of and management of hypoglycemia  Written patient instructions provided.  Total time in face to face counseling 30 minutes.   Follow up Pharmacist Clinic Visit in 2 weeks    Regina Eck, PharmD, BCPS Clinical Pharmacist, [place]  II Phone [phone]

## 2020-03-13 ENCOUNTER — Telehealth: Payer: Self-pay | Admitting: Family Medicine

## 2020-03-14 ENCOUNTER — Ambulatory Visit: Payer: Medicare Other

## 2020-03-14 ENCOUNTER — Other Ambulatory Visit: Payer: Self-pay

## 2020-03-14 DIAGNOSIS — E119 Type 2 diabetes mellitus without complications: Secondary | ICD-10-CM

## 2020-03-14 NOTE — Progress Notes (Signed)
Patient here today to have [place] changed.  Application of Libre sensor explained to patient and his wife and applied to left arm.  Patient voiced understanding.

## 2020-03-15 NOTE — Telephone Encounter (Signed)
Can you have patient make an appt to put on libre?  12min pharmacy clinic appt

## 2020-03-18 ENCOUNTER — Other Ambulatory Visit: Payer: Self-pay | Admitting: Family Medicine

## 2020-03-18 DIAGNOSIS — F411 Generalized anxiety disorder: Secondary | ICD-10-CM

## 2020-03-20 ENCOUNTER — Other Ambulatory Visit: Payer: Self-pay

## 2020-03-20 ENCOUNTER — Encounter: Payer: Self-pay | Admitting: Pharmacist

## 2020-03-20 ENCOUNTER — Ambulatory Visit (INDEPENDENT_AMBULATORY_CARE_PROVIDER_SITE_OTHER): Payer: Medicare Other | Admitting: Pharmacist

## 2020-03-20 ENCOUNTER — Telehealth: Payer: Self-pay | Admitting: Family Medicine

## 2020-03-20 VITALS — BP 115/67 | HR 79

## 2020-03-20 DIAGNOSIS — E119 Type 2 diabetes mellitus without complications: Secondary | ICD-10-CM

## 2020-03-20 NOTE — Progress Notes (Signed)
    03/20/2020 Name: [name] MRN: [id number] DOB: 1951-08-02   S:  24 yoM presents for diabetes evaluation, education, and management Patient was referred and last seen by Primary Care Provider on 01/12/20 Patient is proudly wearing his freestyle libre and remains compliant with medications and checking BG.  He has an upcoming eye surgery this Thursday.  Insurance coverage/medication affordability: [employer] with medications.  Current diabetes medications include:levemir, HUMALOG, jardiance, metformin  Current hypertension medications include:lisinopril, amlodipine Goal 130/80  Current hyperlipidemia medications include:rosuvastatin   Patient denies hypoglycemic events.   Patient reported dietary habits: Eats 2-3 meals/day Admits to having some ice cream and cake at times Enjoys eggs and bologna for breakfast  Discussed meal planning options and Plate method for healthy eating . Avoid sugary drinks and desserts . Incorporate balanced protein, non starchy veggies, 1 serving of carbohydrate with each meal . Increase water intake . Increase physical activity as able  Patient-reported exercise habits: n/a  Patient denies nocturia (nighttime urination). 2x per night  Patient denies neuropathy (nerve pain).  Patient reports visual changes.  Patient reports self foot exams.  Sees Dr. Irving Shows (podiatry)    O:  Lab Results  Component Value Date   HGBA1C 6.9 01/11/2020    Vitals:   03/20/20 0902  BP: 115/67  Pulse: 79      Lipid Panel     Component Value Date/Time   CHOL 124 01/11/2020 0937   TRIG 123 01/11/2020 0937   HDL 31 (L) 01/11/2020 0937   CHOLHDL 4.0 01/11/2020 0937   LDLCALC 71 01/11/2020 0937    Home fasting blood sugars: 115, 102, 106, 91 avg 80-130s avg 7 days: 107 avg 14 days: 120 avg 30 days: 122 avg 90 days: 122 2 hour post-meal/random blood sugars: 140-200   A/P:  Diabetes T2DM currently  controlled. Patient is able to verbalize appropriate hypoglycemia management plan. Patient is adherent with medication.   -Continue levemir 70 units Twice daily--patient adjusts levemir is BGs are <90  -follow up to switch patient to longer acting once daily basal at next PCP appt  -Continuedrapid insulin HUMALOG(insulin LISPRO)  -ContinuedSGLT2-I JARDIANCE(generic name EMPAGLIFLOZIN)  -CONTINUE METFORMIN  -Plan to make adjustments at upcoming PCP appt--patient would like to come off insulin.  Will consider GLP1  -Extensively discussed pathophysiology of diabetes, recommended lifestyle interventions, dietary effects on blood sugar control  -Counseled on s/sx of and management of hypoglycemia  -Next A1C anticipated 04/14/20.    Written patient instructions provided.  Total time in face to face counseling 25 minutes.   Follow up PCP Clinic Visit ON 04/14/20.   Regina Eck, PharmD, BCPS Clinical Pharmacist, [place]  II Phone [phone]

## 2020-03-21 ENCOUNTER — Other Ambulatory Visit: Payer: Self-pay | Admitting: *Deleted

## 2020-03-21 MED ORDER — AMLODIPINE BESYLATE 5 MG PO TABS: 5.0000 mg | ORAL_TABLET | Freq: Every day | ORAL | 0 refills | Status: DC

## 2020-03-21 MED ORDER — LISINOPRIL 40 MG PO TABS: 40.0000 mg | ORAL_TABLET | Freq: Every day | ORAL | 0 refills | Status: DC

## 2020-03-21 NOTE — Telephone Encounter (Signed)
Returned call to patient, left VM requesting call back

## 2020-03-23 DIAGNOSIS — E1139 Type 2 diabetes mellitus with other diabetic ophthalmic complication: Secondary | ICD-10-CM | POA: Diagnosis not present

## 2020-03-23 DIAGNOSIS — H401123 Primary open-angle glaucoma, left eye, severe stage: Secondary | ICD-10-CM | POA: Diagnosis not present

## 2020-03-23 DIAGNOSIS — Z794 Long term (current) use of insulin: Secondary | ICD-10-CM | POA: Diagnosis not present

## 2020-03-23 DIAGNOSIS — I2510 Atherosclerotic heart disease of native coronary artery without angina pectoris: Secondary | ICD-10-CM | POA: Diagnosis not present

## 2020-03-23 DIAGNOSIS — E559 Vitamin D deficiency, unspecified: Secondary | ICD-10-CM | POA: Diagnosis not present

## 2020-03-23 DIAGNOSIS — E785 Hyperlipidemia, unspecified: Secondary | ICD-10-CM | POA: Diagnosis not present

## 2020-03-23 DIAGNOSIS — I10 Essential (primary) hypertension: Secondary | ICD-10-CM | POA: Diagnosis not present

## 2020-03-23 DIAGNOSIS — H401133 Primary open-angle glaucoma, bilateral, severe stage: Secondary | ICD-10-CM | POA: Diagnosis not present

## 2020-03-23 DIAGNOSIS — K219 Gastro-esophageal reflux disease without esophagitis: Secondary | ICD-10-CM | POA: Diagnosis not present

## 2020-03-23 DIAGNOSIS — E1169 Type 2 diabetes mellitus with other specified complication: Secondary | ICD-10-CM | POA: Diagnosis not present

## 2020-03-23 DIAGNOSIS — Z79899 Other long term (current) drug therapy: Secondary | ICD-10-CM | POA: Diagnosis not present

## 2020-03-23 DIAGNOSIS — Z87891 Personal history of nicotine dependence: Secondary | ICD-10-CM | POA: Diagnosis not present

## 2020-03-23 DIAGNOSIS — H4010X3 Unspecified open-angle glaucoma, severe stage: Secondary | ICD-10-CM | POA: Diagnosis not present

## 2020-03-23 DIAGNOSIS — M8949 Other hypertrophic osteoarthropathy, multiple sites: Secondary | ICD-10-CM | POA: Diagnosis not present

## 2020-03-29 ENCOUNTER — Other Ambulatory Visit: Payer: Self-pay | Admitting: Family Medicine

## 2020-03-29 DIAGNOSIS — K5903 Drug induced constipation: Secondary | ICD-10-CM

## 2020-04-04 ENCOUNTER — Other Ambulatory Visit: Payer: Self-pay

## 2020-04-04 ENCOUNTER — Other Ambulatory Visit: Payer: Self-pay | Admitting: Family Medicine

## 2020-04-04 ENCOUNTER — Ambulatory Visit (INDEPENDENT_AMBULATORY_CARE_PROVIDER_SITE_OTHER): Payer: Medicare Other | Admitting: Family Medicine

## 2020-04-04 ENCOUNTER — Encounter: Payer: Self-pay | Admitting: Family Medicine

## 2020-04-04 ENCOUNTER — Telehealth: Payer: Self-pay | Admitting: Family Medicine

## 2020-04-04 VITALS — BP 135/75 | HR 82 | Temp 97.3°F | Resp 20 | Ht 73.0 in | Wt 280.2 lb

## 2020-04-04 DIAGNOSIS — R109 Unspecified abdominal pain: Secondary | ICD-10-CM

## 2020-04-04 DIAGNOSIS — F411 Generalized anxiety disorder: Secondary | ICD-10-CM

## 2020-04-04 LAB — URINALYSIS
Bilirubin, UA: NEGATIVE
Leukocytes,UA: NEGATIVE
Nitrite, UA: NEGATIVE
Protein,UA: NEGATIVE
Specific Gravity, UA: 1.01 (ref 1.005–1.030)
Urobilinogen, Ur: 0.2 mg/dL (ref 0.2–1.0)
pH, UA: 5 (ref 5.0–7.5)

## 2020-04-04 MED ORDER — PREDNISONE 10 MG PO TABS: ORAL_TABLET | ORAL | 0 refills | Status: DC

## 2020-04-04 MED ORDER — AMOXICILLIN-POT CLAVULANATE 875-125 MG PO TABS: 1.0000 | ORAL_TABLET | Freq: Two times a day (BID) | ORAL | 0 refills | Status: DC

## 2020-04-04 NOTE — Telephone Encounter (Signed)
Pt scheduled to come in today with Dr Livia Snellen at 2:10.

## 2020-04-04 NOTE — Progress Notes (Signed)
Subjective:  Patient ID: [name], male    DOB: 03/07/1951  Age: 69 y.o. MRN: [id number]  CC: Balance feels off and Back Pain   HPI [name] presents for feeling off balance.  This is been going on for several days.  It actually has been present for perhaps even months but manageable.  It is gotten more significant recently.  His hearing has been diminished as well.  He feels like there is fluid in the ears sloshing.  He is also concerned about some right flank pain.  It hurts more when he stands up and moves around.  It is not present when he is laying down.  It has been moderate.  He does not have any dysuria.  Depression screen [facility] 2/9 04/04/2020 01/12/2020 01/12/2020  Decreased Interest 0 0 0  Down, Depressed, Hopeless 1 3 0  PHQ - 2 Score 1 3 0  Altered sleeping - 3 -  Tired, decreased energy - 3 -  Change in appetite - 0 -  Feeling bad or failure about yourself  - 0 -  Trouble concentrating - 3 -  Moving slowly or fidgety/restless - 3 -  Suicidal thoughts - 0 -  PHQ-9 Score - 15 -  Difficult doing work/chores - Somewhat difficult -    History [name] has a past medical history of Anxiety, Arthritis, Diabetes mellitus without complication ([place]), GERD (gastroesophageal reflux disease), Hypercholesteremia, and Hypertension.   He has a past surgical history that includes boils; Rotator cuff repair (Right); Cataract extraction w/PHACO (Right, 04/02/2013); Eye surgery (Right); Cataract extraction w/PHACO (Left, 10/04/2013); and Colonoscopy (N/A, 07/03/2015).   His family history includes Diabetes in his mother; Glaucoma in his mother.He reports that he quit smoking about 8 years ago. His smoking use included cigarettes. He has a 20.50 pack-year smoking history. He has never used smokeless tobacco. He reports that he does not drink alcohol and does not use drugs.    ROS Review of Systems  Constitutional: Negative for fever.  HENT: Positive for ear pain and hearing loss.     Respiratory: Negative for shortness of breath.   Cardiovascular: Negative for chest pain.  Musculoskeletal: Positive for arthralgias and back pain.  Skin: Negative for rash.    Objective:  BP 135/75   Pulse 82   Temp (!) 97.3 F (36.3 C) (Temporal)   Resp 20   Ht 6\' 1"  (1.854 m)   Wt 280 lb 4 oz (127.1 kg)   SpO2 98%   BMI 36.97 kg/m   BP Readings from Last 3 Encounters:  04/04/20 135/75  03/20/20 115/67  01/28/20 113/68    Wt Readings from Last 3 Encounters:  04/04/20 280 lb 4 oz (127.1 kg)  01/12/20 272 lb 6.4 oz (123.6 kg)  11/23/19 277 lb 12.8 oz (126 kg)     Physical Exam Vitals reviewed.  Constitutional:      Appearance: He is well-developed.  HENT:     Head: Normocephalic and atraumatic.     Right Ear: External ear normal.     Left Ear: External ear normal.     Mouth/Throat:     Pharynx: No oropharyngeal exudate or posterior oropharyngeal erythema.  Eyes:     Pupils: Pupils are equal, round, and reactive to light.  Cardiovascular:     Rate and Rhythm: Normal rate and regular rhythm.     Heart sounds: No murmur heard.   Pulmonary:     Effort: No respiratory distress.  Breath sounds: Normal breath sounds.  Musculoskeletal:        General: No tenderness.     Cervical back: Normal range of motion and neck supple.  Neurological:     Mental Status: He is alert and oriented to person, place, and time.       Assessment & Plan:   [name] was seen today for balance feels off and back pain.  Diagnoses and all orders for this visit:  Acute right flank pain -     Cancel: Urinalysis, Complete -     Urine Culture -     Urinalysis  Other orders -     amoxicillin-clavulanate (AUGMENTIN) 875-125 MG tablet; Take 1 tablet by mouth 2 (two) times daily. Take all of this medication -     predniSONE (DELTASONE) 10 MG tablet; Take 5 daily for 2 days followed by 4,3,2 and 1 for 2 days each.       I am having [name]. [name] start on  amoxicillin-clavulanate and predniSONE. I am also having him maintain his insulin detemir, acetaZOLAMIDE, latanoprost, insulin lispro, empagliflozin, Accu-Chek Aviva Plus, B-D ULTRAFINE III SHORT PEN, dorzolamide, Accu-Chek Softclix Lancets, rosuvastatin, famotidine, Durezol, ergocalciferol, ALPRAZolam, FreeStyle Libre Reader, FreeStyle Libre 14 Day Sensor, metFORMIN, lisinopril, amLODipine, lactulose, and mirtazapine.  Allergies as of 04/04/2020   No Known Allergies     Medication List       Accurate as of April 04, 2020 11:26 PM. If you have any questions, ask your nurse or doctor.        Accu-Chek Aviva Plus test strip Generic drug: glucose blood USE TO MONITOR BLOOD GLUCOSE 3 TIME(S) DAILY. DX E11.9   Accu-Chek Softclix Lancets lancets Check BS TID Dx E11.9   acetaZOLAMIDE 500 MG capsule Commonly known as: DIAMOX Take 1 capsule by mouth 2 (two) times a day.   ALPRAZolam 1 MG tablet Commonly known as: XANAX Take 0.5-1 tablets (0.5-1 mg total) by mouth 3 (three) times daily as needed for anxiety.   amLODipine 5 MG tablet Commonly known as: NORVASC Take 1 tablet (5 mg total) by mouth daily.   amoxicillin-clavulanate 875-125 MG tablet Commonly known as: AUGMENTIN Take 1 tablet by mouth 2 (two) times daily. Take all of this medication Started by: Claretta Fraise, MD   B-D ULTRAFINE III SHORT PEN 31G X 8 MM Misc Generic drug: Insulin Pen Needle Use to give insulin daily Dx E11.9   dorzolamide 2 % ophthalmic solution Commonly known as: TRUSOPT Apply to eye.   Durezol 0.05 % Emul Generic drug: Difluprednate Apply to eye.   empagliflozin 25 MG Tabs tablet Commonly known as: JARDIANCE Take 25 mg by mouth daily before breakfast.   ergocalciferol 1.25 MG (50000 UT) capsule Commonly known as: VITAMIN D2 Take 50,000 Units by mouth once a week.   famotidine 20 MG tablet Commonly known as: PEPCID Take 1 tablet (20 mg total) by mouth 2 (two) times daily.   FreeStyle  Libre 14 Day Sensor Misc Scan to check glucose at least 4 times daily. DX: E11.4   FreeStyle Libre Reader Devi Use to check glucose at least 4 times daily. Dx E11.40   HumaLOG KwikPen 100 UNIT/ML KwikPen Generic drug: insulin lispro Inject 15 Units into the skin 3 (three) times daily.   lactulose 10 GM/15ML solution Commonly known as: CHRONULAC TAKE 30 MLS (20 G TOTAL) BY MOUTH DAILY AS NEEDED FOR UP TO 8 DOSES FOR MILD CONSTIPATION.   latanoprost 0.005 % ophthalmic solution Commonly known as: Ivin Poot  Place 1 drop into both eyes at bedtime.   Levemir FlexPen 100 UNIT/ML FlexPen Generic drug: insulin detemir Inject 70 Units into the skin 2 (two) times daily.   lisinopril 40 MG tablet Commonly known as: ZESTRIL Take 1 tablet (40 mg total) by mouth daily.   metFORMIN 500 MG 24 hr tablet Commonly known as: GLUCOPHAGE-XR TAKE 2 TABLETS BY MOUTH TWICE A DAY WITH FOOD   mirtazapine 15 MG tablet Commonly known as: REMERON TAKE 1 TABLET (15 MG TOTAL) BY MOUTH AT BEDTIME. (FOR ANXIETY, DEPRESSION, SLEEP)   predniSONE 10 MG tablet Commonly known as: DELTASONE Take 5 daily for 2 days followed by 4,3,2 and 1 for 2 days each. Started by: Claretta Fraise, MD   rosuvastatin 20 MG tablet Commonly known as: CRESTOR TAKE 1 TABLET BY MOUTH EVERYDAY AT BEDTIME        Follow-up: No follow-ups on file.  Claretta Fraise, M.D.

## 2020-04-06 LAB — URINE CULTURE: Organism ID, Bacteria: NO GROWTH

## 2020-04-13 ENCOUNTER — Other Ambulatory Visit: Payer: Self-pay | Admitting: *Deleted

## 2020-04-13 ENCOUNTER — Other Ambulatory Visit: Payer: Medicare Other

## 2020-04-13 ENCOUNTER — Other Ambulatory Visit: Payer: Self-pay

## 2020-04-13 DIAGNOSIS — E119 Type 2 diabetes mellitus without complications: Secondary | ICD-10-CM | POA: Diagnosis not present

## 2020-04-13 DIAGNOSIS — E785 Hyperlipidemia, unspecified: Secondary | ICD-10-CM

## 2020-04-13 DIAGNOSIS — E1169 Type 2 diabetes mellitus with other specified complication: Secondary | ICD-10-CM | POA: Diagnosis not present

## 2020-04-13 LAB — BAYER DCA HB A1C WAIVED: HB A1C (BAYER DCA - WAIVED): 7.5 % — ABNORMAL HIGH (ref <7.0)

## 2020-04-14 ENCOUNTER — Encounter: Payer: Self-pay | Admitting: Family Medicine

## 2020-04-14 ENCOUNTER — Ambulatory Visit (INDEPENDENT_AMBULATORY_CARE_PROVIDER_SITE_OTHER): Payer: Medicare Other | Admitting: Family Medicine

## 2020-04-14 ENCOUNTER — Telehealth: Payer: Self-pay | Admitting: Pharmacist

## 2020-04-14 VITALS — BP 132/78 | HR 79 | Temp 97.8°F | Ht 73.0 in | Wt 280.0 lb

## 2020-04-14 DIAGNOSIS — F411 Generalized anxiety disorder: Secondary | ICD-10-CM

## 2020-04-14 DIAGNOSIS — E119 Type 2 diabetes mellitus without complications: Secondary | ICD-10-CM | POA: Diagnosis not present

## 2020-04-14 DIAGNOSIS — I152 Hypertension secondary to endocrine disorders: Secondary | ICD-10-CM

## 2020-04-14 DIAGNOSIS — R42 Dizziness and giddiness: Secondary | ICD-10-CM

## 2020-04-14 DIAGNOSIS — E785 Hyperlipidemia, unspecified: Secondary | ICD-10-CM

## 2020-04-14 DIAGNOSIS — E1159 Type 2 diabetes mellitus with other circulatory complications: Secondary | ICD-10-CM

## 2020-04-14 DIAGNOSIS — E1169 Type 2 diabetes mellitus with other specified complication: Secondary | ICD-10-CM | POA: Diagnosis not present

## 2020-04-14 DIAGNOSIS — H938X2 Other specified disorders of left ear: Secondary | ICD-10-CM

## 2020-04-14 DIAGNOSIS — I10 Essential (primary) hypertension: Secondary | ICD-10-CM

## 2020-04-14 LAB — LIPID PANEL
Chol/HDL Ratio: 2.7 ratio (ref 0.0–5.0)
Cholesterol, Total: 116 mg/dL (ref 100–199)
HDL: 43 mg/dL (ref >39)
LDL Chol Calc (NIH): 53 mg/dL (ref 0–99)
Triglycerides: 111 mg/dL (ref 0–149)
VLDL Cholesterol Cal: 20 mg/dL (ref 5–40)

## 2020-04-14 LAB — CBC WITH DIFFERENTIAL/PLATELET
Basophils Absolute: 0.1 10*3/uL (ref 0.0–0.2)
Basos: 1 %
EOS (ABSOLUTE): 0.2 10*3/uL (ref 0.0–0.4)
Eos: 2 %
Hematocrit: 44 % (ref 37.5–51.0)
Hemoglobin: 14 g/dL (ref 13.0–17.7)
Immature Grans (Abs): 0 10*3/uL (ref 0.0–0.1)
Immature Granulocytes: 0 %
Lymphocytes Absolute: 3.2 10*3/uL — ABNORMAL HIGH (ref 0.7–3.1)
Lymphs: 40 %
MCH: 26.6 pg (ref 26.6–33.0)
MCHC: 31.8 g/dL (ref 31.5–35.7)
MCV: 84 fL (ref 79–97)
Monocytes Absolute: 0.5 10*3/uL (ref 0.1–0.9)
Monocytes: 6 %
Neutrophils Absolute: 4.1 10*3/uL (ref 1.4–7.0)
Neutrophils: 51 %
Platelets: 259 10*3/uL (ref 150–450)
RBC: 5.26 x10E6/uL (ref 4.14–5.80)
RDW: 14.6 % (ref 11.6–15.4)
WBC: 8 10*3/uL (ref 3.4–10.8)

## 2020-04-14 LAB — CMP14+EGFR
ALT: 23 IU/L (ref 0–44)
AST: 15 IU/L (ref 0–40)
Albumin/Globulin Ratio: 1.2 (ref 1.2–2.2)
Albumin: 4.1 g/dL (ref 3.8–4.8)
Alkaline Phosphatase: 67 IU/L (ref 48–121)
BUN/Creatinine Ratio: 18 (ref 10–24)
BUN: 17 mg/dL (ref 8–27)
Bilirubin Total: 0.3 mg/dL (ref 0.0–1.2)
CO2: 28 mmol/L (ref 20–29)
Calcium: 9.4 mg/dL (ref 8.6–10.2)
Chloride: 103 mmol/L (ref 96–106)
Creatinine, Ser: 0.97 mg/dL (ref 0.76–1.27)
GFR calc Af Amer: 92 mL/min/{1.73_m2} (ref >59)
GFR calc non Af Amer: 79 mL/min/{1.73_m2} (ref >59)
Globulin, Total: 3.4 g/dL (ref 1.5–4.5)
Glucose: 117 mg/dL — ABNORMAL HIGH (ref 65–99)
Potassium: 4 mmol/L (ref 3.5–5.2)
Sodium: 144 mmol/L (ref 134–144)
Total Protein: 7.5 g/dL (ref 6.0–8.5)

## 2020-04-14 MED ORDER — ALPRAZOLAM 1 MG PO TABS: 0.5000 mg | ORAL_TABLET | Freq: Three times a day (TID) | ORAL | 0 refills | Status: DC | PRN

## 2020-04-14 MED ORDER — MIRTAZAPINE 30 MG PO TABS: 30.0000 mg | ORAL_TABLET | Freq: Every day | ORAL | 0 refills | Status: DC

## 2020-04-14 NOTE — Telephone Encounter (Signed)
Libre 14 day sensor sample given to patient.  He is awaiting his next shipment from [facility]

## 2020-04-14 NOTE — Patient Instructions (Addendum)
Sugar is up but I think it's from Prednisone We talked about replacing your insulin with Rybelsus or Ozempic.  Please see Almyra Free for start/ samples if you want to do this  We talked about having an ear nose and throat doctor see you for the ear and balance.  You wanted to hold off  For the leg weakness, it may be worth seeing physical therapy to help with balance and strength.  I have doubled the Mirtazapine dose to 30mg .  You may take TWO of the 15mg  tablets until this bottle is gone and then [facility] to ONE tablet of the new 30mg  dose.  Take Xanax ONLY if absolutely needed.

## 2020-04-14 NOTE — Progress Notes (Signed)
Subjective: CC: GAD, DM2, HTN, HLD PCP: [name], DO HQI:ONGEX C [name] is a 69 y.o. male presenting to clinic today for:  1. GAD/ situational anxiety Patient reports longstanding history of generalized anxiety disorder/situational anxiety disorder and panic disorder. He was previously seen by a psychiatrist in [place] but he was released to PCP care given stability.  He does report ongoing situational anxiety.  He is used up all the Xanax I gave him last visit.  Unsure if he was using this only as needed rather than as scheduled.  He does continue to have difficulty sleeping.  He does report compliance with mirtazapine 15 mg nightly.  Does not report any adverse side effects.  2. Type 2 Diabetes w/ HTN, HLD:  Patient reports compliance with Metformin 1000 mg twice daily, Humalog 15 units with meals 3 times daily, Levemir 70 units twice daily and Jardiance 25 mg daily, Norvasc 5 mg daily, lisinopril 40, Crestor 20 mg daily.  Is currently being treated with prednisone for his back.  He has been having some left-sided leg weakness.  He was seen by another provider recently for this.  He continues to have intermittent dizziness.  He feels like he has fluid on the left ear and would like me to check this today.  His balance continues to be an issue.  He is undergoing treatment for his vision and eye disease and "does not have time to see physical therapy or an ear nose and throat doctor".  He is reliant on transportation and is currently paying people to get him to appointments.  Has not utilized [facility]  Last eye exam: UTD Last foot exam: needs  Last A1c:  Lab Results  Component Value Date   HGBA1C 7.5 (H) 04/13/2020   Nephropathy screen indicated?:  On ACE inhibitor Last flu, zoster and/or pneumovax:  Immunization History  Administered Date(s) Administered  . Fluad Quad(high Dose 65+) 04/13/2019  . Moderna SARS-COVID-2 Vaccination 09/30/2019, 10/29/2019  . Pneumococcal Conjugate-13  04/23/2018  . Pneumococcal Polysaccharide-23 12/28/2015  . Zoster 01/04/2016     ROS: Per HPI  No Known Allergies Past Medical History:  Diagnosis Date  . Anxiety   . Arthritis   . Diabetes mellitus without complication ([place])   . GERD (gastroesophageal reflux disease)   . Hypercholesteremia   . Hypertension     Current Outpatient Medications:  .  ACCU-CHEK AVIVA PLUS test strip, USE TO MONITOR BLOOD GLUCOSE 3 TIME(S) DAILY. DX E11.9, Disp: 300 strip, Rfl: 3 .  Accu-Chek Softclix Lancets lancets, Check BS TID Dx E11.9, Disp: 100 each, Rfl: 12 .  acetaZOLAMIDE (DIAMOX) 500 MG capsule, Take 1 capsule by mouth 2 (two) times a day., Disp: , Rfl:  .  ALPRAZolam (XANAX) 1 MG tablet, Take 0.5-1 tablets (0.5-1 mg total) by mouth 3 (three) times daily as needed for anxiety., Disp: 90 tablet, Rfl: 0 .  amLODipine (NORVASC) 5 MG tablet, Take 1 tablet (5 mg total) by mouth daily., Disp: 90 tablet, Rfl: 0 .  amoxicillin-clavulanate (AUGMENTIN) 875-125 MG tablet, Take 1 tablet by mouth 2 (two) times daily. Take all of this medication, Disp: 20 tablet, Rfl: 0 .  Continuous Blood Gluc Receiver (FREESTYLE LIBRE READER) DEVI, Use to check glucose at least 4 times daily. Dx E11.40, Disp: 1 each, Rfl: 1 .  Continuous Blood Gluc Sensor (FREESTYLE LIBRE 14 DAY SENSOR) MISC, Scan to check glucose at least 4 times daily. DX: E11.4, Disp: 2 each, Rfl: 12 .  Difluprednate (DUREZOL)  0.05 % EMUL, Apply to eye., Disp: , Rfl:  .  dorzolamide (TRUSOPT) 2 % ophthalmic solution, Apply to eye., Disp: , Rfl:  .  empagliflozin (JARDIANCE) 25 MG TABS tablet, Take 25 mg by mouth daily before breakfast., Disp: 90 tablet, Rfl: 0 .  ergocalciferol (VITAMIN D2) 1.25 MG (50000 UT) capsule, Take 50,000 Units by mouth once a week., Disp: , Rfl:  .  famotidine (PEPCID) 20 MG tablet, Take 1 tablet (20 mg total) by mouth 2 (two) times daily., Disp: 60 tablet, Rfl: 2 .  Insulin Detemir (LEVEMIR FLEXPEN) 100 UNIT/ML Pen, Inject 70  Units into the skin 2 (two) times daily. , Disp: , Rfl:  .  insulin lispro (HUMALOG KWIKPEN) 100 UNIT/ML KwikPen, Inject 15 Units into the skin 3 (three) times daily., Disp: , Rfl:  .  Insulin Pen Needle (B-D ULTRAFINE III SHORT PEN) 31G X 8 MM MISC, Use to give insulin daily Dx E11.9, Disp: 100 each, Rfl: 3 .  lactulose (CHRONULAC) 10 GM/15ML solution, TAKE 30 MLS (20 G TOTAL) BY MOUTH DAILY AS NEEDED FOR UP TO 8 DOSES FOR MILD CONSTIPATION., Disp: 720 mL, Rfl: 2 .  latanoprost (XALATAN) 0.005 % ophthalmic solution, Place 1 drop into both eyes at bedtime., Disp: , Rfl:  .  lisinopril (ZESTRIL) 40 MG tablet, Take 1 tablet (40 mg total) by mouth daily., Disp: 90 tablet, Rfl: 0 .  metFORMIN (GLUCOPHAGE-XR) 500 MG 24 hr tablet, TAKE 2 TABLETS BY MOUTH TWICE A DAY WITH FOOD, Disp: 360 tablet, Rfl: 0 .  mirtazapine (REMERON) 15 MG tablet, TAKE 1 TABLET (15 MG TOTAL) BY MOUTH AT BEDTIME. (FOR ANXIETY, DEPRESSION, SLEEP), Disp: 90 tablet, Rfl: 0 .  predniSONE (DELTASONE) 10 MG tablet, Take 5 daily for 2 days followed by 4,3,2 and 1 for 2 days each., Disp: 30 tablet, Rfl: 0 .  rosuvastatin (CRESTOR) 20 MG tablet, TAKE 1 TABLET BY MOUTH EVERYDAY AT BEDTIME, Disp: 90 tablet, Rfl: 1 Social History   Socioeconomic History  . Marital status: Divorced    Spouse name: Not on file  . Number of children: Not on file  . Years of education: Not on file  . Highest education level: Not on file  Occupational History  . Not on file  Tobacco Use  . Smoking status: Former Smoker    Packs/day: 0.50    Years: 41.00    Pack years: 20.50    Types: Cigarettes    Quit date: 02/29/2012    Years since quitting: 8.1  . Smokeless tobacco: Never Used  Vaping Use  . Vaping Use: Never used  Substance and Sexual Activity  . Alcohol use: No    Comment: quit 2012  . Drug use: No    Comment: quit in 2012  . Sexual activity: Yes    Birth control/protection: None  Other Topics Concern  . Not on file  Social History  Narrative  . Not on file   Social Determinants of Health   Financial Resource Strain:   . Difficulty of Paying Living Expenses: Not on file  Food Insecurity:   . Worried About Charity fundraiser in the Last Year: Not on file  . Ran Out of Food in the Last Year: Not on file  Transportation Needs:   . Lack of Transportation (Medical): Not on file  . Lack of Transportation (Non-Medical): Not on file  Physical Activity:   . Days of Exercise per Week: Not on file  . Minutes of Exercise per Session:  Not on file  Stress:   . Feeling of Stress : Not on file  Social Connections:   . Frequency of Communication with Friends and Family: Not on file  . Frequency of Social Gatherings with Friends and Family: Not on file  . Attends Religious Services: Not on file  . Active Member of Clubs or Organizations: Not on file  . Attends Archivist Meetings: Not on file  . Marital Status: Not on file  Intimate Partner Violence:   . Fear of Current or Ex-Partner: Not on file  . Emotionally Abused: Not on file  . Physically Abused: Not on file  . Sexually Abused: Not on file   Family History  Problem Relation Age of Onset  . Diabetes Mother   . Glaucoma Mother     Objective: Office vital signs reviewed. BP 132/78   Pulse 79   Temp 97.8 F (36.6 C) (Temporal)   Ht 6\' 1"  (1.854 m)   Wt 280 lb (127 kg)   BMI 36.94 kg/m   Physical Examination:  General: Awake, alert, well nourished, No acute distress HEENT: Normal; he has discoloration of the right cornea; TMs intact bilaterally.  Normal light reflex.  No appreciable abnormalities. Cardio: regular rate and rhythm, S1S2 heard, no murmurs appreciated Pulm: clear to auscultation bilaterally, no wheezes, rhonchi or rales; normal work of breathing on room air Extremities: warm, well perfused, No edema, cyanosis or clubbing; +2 pulses bilaterally MSK: Uses cane for ambulation; gait antalgic and slow Neuro: No tremor.  Alert and oriented  x3. Psych: Mood stable, speech normal, affect appropriate, pleasant and interactive. Depression screen [facility] 2/9 04/14/2020 04/04/2020 01/12/2020  Decreased Interest 0 0 0  Down, Depressed, Hopeless 0 1 3  PHQ - 2 Score 0 1 3  Altered sleeping - - 3  Tired, decreased energy - - 3  Change in appetite - - 0  Feeling bad or failure about yourself  - - 0  Trouble concentrating - - 3  Moving slowly or fidgety/restless - - 3  Suicidal thoughts - - 0  PHQ-9 Score - - 15  Difficult doing work/chores - - Somewhat difficult    GAD 7 : Generalized Anxiety Score 01/12/2020 04/06/2019  Nervous, Anxious, on Edge 3 2  Control/stop worrying 1 1  Worry too much - different things 1 2  Trouble relaxing 3 1  Restless 1 0  Easily annoyed or irritable 1 1  Afraid - awful might happen 0 1  Total GAD 7 Score 10 8  Anxiety Difficulty Somewhat difficult -    Assessment/ Plan: 69 y.o. male   1. GAD (generalized anxiety disorder) Not well controlled currently.  I am increasing his dose of mirtazapine at bedtime.  We discussed that he may double up on the 15 mg that he needs to switch over to 30 mg once he is finished his current bottle.  This is reiterated on his AVS.  Alprazolam was renewed and I again reinforced the need for sparing use of this medicine.  The national narcotic database was reviewed and there were no red flags - ALPRAZolam (XANAX) 1 MG tablet; Take 0.5-1 tablets (0.5-1 mg total) by mouth 3 (three) times daily as needed for anxiety (take ONLY IF NEEDED).  Dispense: 90 tablet; Refill: 0 - mirtazapine (REMERON) 30 MG tablet; Take 1 tablet (30 mg total) by mouth at bedtime. (for anxiety, depression, sleep). Please inform of change in pill/ dose!  Dispense: 90 tablet; Refill: 0  2. Type  2 diabetes mellitus without complication, without long-term current use of insulin (HCC) A1c is up today but I think this is in the setting of active use of prednisone for his back.  3. Hyperlipidemia associated with  type 2 diabetes mellitus ([place]) Continue statin  4. Hypertension associated with diabetes ([place]) 1 controlled  5. Morbid obesity ([place]) Would help if he could lose some weight.  Back pain certainly would improve  6. Dizziness ?  Related to underlying eye disease versus inner ear issue?  I recommended eval but he declined this.  His symptoms are refractory to meclizine.?  Due to left fullness in the ear.  I wonder if this is BPPV?  This in combination of his ongoing low back pain rating to the left leg may be also exacerbating balance problems.  Again, offered referral to PT but he declined this.  He will contact me should he change his mind.  7. Ear fullness, left   No orders of the defined types were placed in this encounter.  No orders of the defined types were placed in this encounter.    [name], DO [place] [phone]

## 2020-04-21 ENCOUNTER — Other Ambulatory Visit: Payer: Self-pay | Admitting: Family Medicine

## 2020-04-25 ENCOUNTER — Ambulatory Visit (INDEPENDENT_AMBULATORY_CARE_PROVIDER_SITE_OTHER): Payer: Medicare Other | Admitting: Pharmacist

## 2020-04-25 ENCOUNTER — Other Ambulatory Visit: Payer: Self-pay

## 2020-04-25 DIAGNOSIS — E119 Type 2 diabetes mellitus without complications: Secondary | ICD-10-CM

## 2020-04-25 MED ORDER — TRESIBA FLEXTOUCH 200 UNIT/ML ~~LOC~~ SOPN: 90.0000 [IU] | PEN_INJECTOR | Freq: Every morning | SUBCUTANEOUS | 5 refills | Status: DC

## 2020-04-25 NOTE — Progress Notes (Signed)
° ° °  04/25/2020 Name: [name] MRN: [id number] DOB: 1951-06-03   S:   61 yoM presents for diabetes evaluation, education, and management Patient was referred and last seen by Primary Care Provider on 04/14/20 Patient is continues to wear [name] CGM and remains compliant with medications and checking BG.  He reports that he has had several libre malfunction.  He has recently had eye surgery this month  Insurance coverage/medication affordability: Ship broker with medications.  Current diabetes medications include:levemir, HUMALOG, jardiance, metformin  Current hypertension medications include:lisinopril, amlodipine Goal 130/80  Current hyperlipidemia medications include:rosuvastatin   Patient denies hypoglycemic events.   Patient reported dietary habits: Eats 2-3 meals/day Admits to having some ice cream and cake at times (this has improved) Enjoys eggs and bologna for breakfast  Discussed meal planning options and Plate method for healthy eating  Avoid sugary drinks and desserts  Incorporate balanced protein, non starchy veggies, 1 serving of carbohydrate with each meal  Increase water intake  Increase physical activity as able  Patient-reported exercise habits: n/a  Patient denies nocturia (nighttime urination). 2-3x per night  Patient denies neuropathy (nerve pain).  Patient reports visual changes.  Patient reports self foot exams.  Sees Dr. Irving Shows (podiatry)   O:  Lab Results  Component Value Date   HGBA1C 7.5 (H) 04/13/2020     Lipid Panel     Component Value Date/Time   CHOL 116 04/13/2020 0906   TRIG 111 04/13/2020 0906   HDL 43 04/13/2020 0906   CHOLHDL 2.7 04/13/2020 0906   LDLCALC 53 04/13/2020 0906         A/P:  Diabetes T2DM, A1c has increased. Patient in target glucose range 62% of the time.   Patient is adherent with medication. Control is suboptimal due to diet.  -Will switch  levemir to tresiba 80-85 units in the AM (better basal coverage)  -sample given-->LOT#[id number], EXP 11/22, #2 boxes  -Continue Humalog   -Continue Jardiance  -Continue metformin  -New sensor for libre 14 day system placed for patient  -Requesting ear drop for flutter/ear in left ear will message PCP  -Extensively discussed pathophysiology of diabetes, recommended lifestyle interventions, dietary effects on blood sugar control  -Counseled on s/sx of and management of hypoglycemia  -Next A1C anticipated 3 montsh  Written patient instructions provided.  Total time in face to face counseling 30 minutes.   Follow up Pharmacist in 2 weeks regarding new insulin switch   Regina Eck, PharmD, BCPS Clinical Pharmacist, [place]  II Phone [phone]

## 2020-04-28 ENCOUNTER — Telehealth: Payer: Self-pay | Admitting: Family Medicine

## 2020-05-02 ENCOUNTER — Other Ambulatory Visit: Payer: Self-pay | Admitting: Family Medicine

## 2020-05-02 DIAGNOSIS — R12 Heartburn: Secondary | ICD-10-CM

## 2020-05-02 NOTE — Telephone Encounter (Signed)
Call returned to patient--left VM

## 2020-05-08 ENCOUNTER — Telehealth: Payer: Self-pay | Admitting: Pharmacist

## 2020-05-08 NOTE — Telephone Encounter (Signed)
Tresiba samples given to patient  His FBG have improved since switching to tresiba (from levemir), 120-135 in the AM Reports steady BGs throughout the day

## 2020-05-10 ENCOUNTER — Other Ambulatory Visit: Payer: Self-pay | Admitting: Family Medicine

## 2020-05-15 ENCOUNTER — Telehealth: Payer: Self-pay

## 2020-05-15 NOTE — Telephone Encounter (Signed)
appt made with PharmD tom 05/16/20

## 2020-05-16 ENCOUNTER — Ambulatory Visit (INDEPENDENT_AMBULATORY_CARE_PROVIDER_SITE_OTHER): Payer: Medicare Other | Admitting: Pharmacist

## 2020-05-16 ENCOUNTER — Other Ambulatory Visit: Payer: Self-pay

## 2020-05-16 DIAGNOSIS — E119 Type 2 diabetes mellitus without complications: Secondary | ICD-10-CM

## 2020-05-16 NOTE — Progress Notes (Signed)
° ° °  05/16/2020 Name: [name] MRN: [id number] DOB: [birth year]   S:  6 yoM presents for diabetes evaluation, education, and management Patient was referred and last seen by Primary Care Provider on 04/14/20 Patient is proudly wearing his freestyle libre and remains compliant with medications and checking BG.  He has another upcoming eye surgery this month.  He is now driving on his own.  Insurance coverage/medication affordability: Ship broker with medications.  Current diabetes medications include:TRESIBA HUMALOG, jardiance, metformin  Current hypertension medications include:lisinopril, amlodipine Goal 130/80  Current hyperlipidemia medications include:rosuvastatin   Patient denies hypoglycemic events.   Patient reported dietary habits: Eats 2-3 meals/day Admits to having some ice cream and cake at times Enjoys eggs and bologna for breakfast The patient is asked to make an attempt to improve diet and exercise patterns to aid in medical management of this problem.   Discussed meal planning options and Plate method for healthy eating  Avoid sugary drinks and desserts  Incorporate balanced protein, non starchy veggies, 1 serving of carbohydrate with each meal  Increase water intake  Increase physical activity as able  Patient-reported exercise habits: n/a  Patient denies nocturia (nighttime urination). 2x per night  Patient denies neuropathy (nerve pain).  Patient reports visual changes.  Patient reports self foot exams.  Sees Dr. Irving Shows (podiatry) O:  Lab Results  Component Value Date   HGBA1C 7.5 (H) 04/13/2020    There were no vitals filed for this visit.     Lipid Panel     Component Value Date/Time   CHOL 116 04/13/2020 0906   TRIG 111 04/13/2020 0906   HDL 43 04/13/2020 0906   CHOLHDL 2.7 04/13/2020 0906   LDLCALC 53 04/13/2020 0906    Home fasting blood sugars: 120-135 in the AM Reports steady BGs  throughout the day      A/P:  Diabetes T2DM currently controlled. Patient is able to verbalize appropriate hypoglycemia management plan. Patient is adherent with medication.   CONTINUE TRESIBA 80 UNITS IN THE MORNING  RX SENT TO HEALTH DEPT TO FILL MEDICATION  SAMPLE GIVEN TO [facility] [employer]  QAS#[id number], EXP 2/23  -Continuedrapid insulin HUMALOG(insulin LISPRO)  -ContinuedSGLT2-I JARDIANCE(generic name EMPAGLIFLOZIN)  Counseled Sick day rules: if sick, vomiting, have diarrhea, or  cannot drink enough fluids, you should stop taking SGLT-2 inhibitors until your symptoms go away. In rare cases, these medicines can cause diabetic ketoacidosis (DKA). DKA is acid buildup in the blood.  -CONTINUE METFORMIN  -Plan to make adjustments at upcoming PCP appt--patient would like to decrease insulin.  Will consider GLP1  -Extensively discussed pathophysiology of diabetes, recommended lifestyle interventions, dietary effects on blood sugar control  -Counseled on s/sx of and management of hypoglycemia  -Next A1C anticipated 07/2020. Assisted patient in making appt.   Written patient instructions provided.  Total time in face to face counseling 20 minutes.   Follow up PCP Clinic Visit in 07/2020.   Regina Eck, PharmD, BCPS Clinical Pharmacist, [place]  II Phone [phone]

## 2020-05-22 DIAGNOSIS — E1140 Type 2 diabetes mellitus with diabetic neuropathy, unspecified: Secondary | ICD-10-CM | POA: Diagnosis not present

## 2020-05-22 DIAGNOSIS — Z794 Long term (current) use of insulin: Secondary | ICD-10-CM | POA: Diagnosis not present

## 2020-05-23 ENCOUNTER — Telehealth: Payer: Self-pay

## 2020-05-23 DIAGNOSIS — I10 Essential (primary) hypertension: Secondary | ICD-10-CM | POA: Diagnosis not present

## 2020-05-23 DIAGNOSIS — M159 Polyosteoarthritis, unspecified: Secondary | ICD-10-CM | POA: Diagnosis not present

## 2020-05-23 DIAGNOSIS — H401133 Primary open-angle glaucoma, bilateral, severe stage: Secondary | ICD-10-CM | POA: Diagnosis not present

## 2020-05-23 DIAGNOSIS — Z794 Long term (current) use of insulin: Secondary | ICD-10-CM | POA: Diagnosis not present

## 2020-05-23 DIAGNOSIS — E119 Type 2 diabetes mellitus without complications: Secondary | ICD-10-CM | POA: Diagnosis not present

## 2020-05-23 DIAGNOSIS — I2510 Atherosclerotic heart disease of native coronary artery without angina pectoris: Secondary | ICD-10-CM | POA: Diagnosis not present

## 2020-05-23 DIAGNOSIS — Z79899 Other long term (current) drug therapy: Secondary | ICD-10-CM | POA: Diagnosis not present

## 2020-05-23 DIAGNOSIS — Z7984 Long term (current) use of oral hypoglycemic drugs: Secondary | ICD-10-CM | POA: Diagnosis not present

## 2020-05-23 DIAGNOSIS — Z87891 Personal history of nicotine dependence: Secondary | ICD-10-CM | POA: Diagnosis not present

## 2020-05-23 DIAGNOSIS — E1169 Type 2 diabetes mellitus with other specified complication: Secondary | ICD-10-CM | POA: Diagnosis not present

## 2020-05-23 DIAGNOSIS — E1159 Type 2 diabetes mellitus with other circulatory complications: Secondary | ICD-10-CM | POA: Diagnosis not present

## 2020-05-23 DIAGNOSIS — E785 Hyperlipidemia, unspecified: Secondary | ICD-10-CM | POA: Diagnosis not present

## 2020-05-23 DIAGNOSIS — H401113 Primary open-angle glaucoma, right eye, severe stage: Secondary | ICD-10-CM | POA: Diagnosis not present

## 2020-05-23 DIAGNOSIS — T85898A Other specified complication of other internal prosthetic devices, implants and grafts, initial encounter: Secondary | ICD-10-CM | POA: Diagnosis not present

## 2020-05-23 DIAGNOSIS — T85398A Other mechanical complication of other ocular prosthetic devices, implants and grafts, initial encounter: Secondary | ICD-10-CM | POA: Diagnosis not present

## 2020-05-23 DIAGNOSIS — I2583 Coronary atherosclerosis due to lipid rich plaque: Secondary | ICD-10-CM | POA: Diagnosis not present

## 2020-05-23 DIAGNOSIS — K219 Gastro-esophageal reflux disease without esophagitis: Secondary | ICD-10-CM | POA: Diagnosis not present

## 2020-05-24 DIAGNOSIS — H401133 Primary open-angle glaucoma, bilateral, severe stage: Secondary | ICD-10-CM | POA: Diagnosis not present

## 2020-05-24 DIAGNOSIS — Z9889 Other specified postprocedural states: Secondary | ICD-10-CM | POA: Diagnosis not present

## 2020-06-14 ENCOUNTER — Other Ambulatory Visit: Payer: Self-pay | Admitting: Family Medicine

## 2020-07-04 DIAGNOSIS — L84 Corns and callosities: Secondary | ICD-10-CM | POA: Diagnosis not present

## 2020-07-04 DIAGNOSIS — B351 Tinea unguium: Secondary | ICD-10-CM | POA: Diagnosis not present

## 2020-07-04 DIAGNOSIS — M79676 Pain in unspecified toe(s): Secondary | ICD-10-CM | POA: Diagnosis not present

## 2020-07-04 DIAGNOSIS — E1142 Type 2 diabetes mellitus with diabetic polyneuropathy: Secondary | ICD-10-CM | POA: Diagnosis not present

## 2020-07-06 ENCOUNTER — Other Ambulatory Visit: Payer: Self-pay | Admitting: Family Medicine

## 2020-07-06 DIAGNOSIS — F411 Generalized anxiety disorder: Secondary | ICD-10-CM

## 2020-07-08 ENCOUNTER — Other Ambulatory Visit: Payer: Self-pay | Admitting: Family Medicine

## 2020-07-08 DIAGNOSIS — F411 Generalized anxiety disorder: Secondary | ICD-10-CM

## 2020-07-10 ENCOUNTER — Telehealth: Payer: Self-pay

## 2020-07-10 NOTE — Telephone Encounter (Signed)
Left detailed message xanax can not be called in until appt

## 2020-07-31 ENCOUNTER — Other Ambulatory Visit: Payer: Medicare Other

## 2020-07-31 ENCOUNTER — Other Ambulatory Visit: Payer: Self-pay

## 2020-08-01 ENCOUNTER — Encounter: Payer: Self-pay | Admitting: Family Medicine

## 2020-08-01 ENCOUNTER — Ambulatory Visit (INDEPENDENT_AMBULATORY_CARE_PROVIDER_SITE_OTHER): Payer: Medicare Other | Admitting: Family Medicine

## 2020-08-01 VITALS — BP 147/77 | HR 85 | Temp 98.1°F | Ht 73.0 in | Wt 296.0 lb

## 2020-08-01 DIAGNOSIS — E1169 Type 2 diabetes mellitus with other specified complication: Secondary | ICD-10-CM | POA: Diagnosis not present

## 2020-08-01 DIAGNOSIS — Z794 Long term (current) use of insulin: Secondary | ICD-10-CM | POA: Diagnosis not present

## 2020-08-01 DIAGNOSIS — E113553 Type 2 diabetes mellitus with stable proliferative diabetic retinopathy, bilateral: Secondary | ICD-10-CM | POA: Diagnosis not present

## 2020-08-01 DIAGNOSIS — Z79899 Other long term (current) drug therapy: Secondary | ICD-10-CM

## 2020-08-01 DIAGNOSIS — E1159 Type 2 diabetes mellitus with other circulatory complications: Secondary | ICD-10-CM

## 2020-08-01 DIAGNOSIS — I152 Hypertension secondary to endocrine disorders: Secondary | ICD-10-CM

## 2020-08-01 DIAGNOSIS — E785 Hyperlipidemia, unspecified: Secondary | ICD-10-CM

## 2020-08-01 DIAGNOSIS — F411 Generalized anxiety disorder: Secondary | ICD-10-CM

## 2020-08-01 LAB — BAYER DCA HB A1C WAIVED: HB A1C (BAYER DCA - WAIVED): 8.5 % — ABNORMAL HIGH (ref <7.0)

## 2020-08-01 MED ORDER — AMLODIPINE BESYLATE 5 MG PO TABS: 5.0000 mg | ORAL_TABLET | Freq: Every day | ORAL | 3 refills | Status: DC

## 2020-08-01 MED ORDER — ALPRAZOLAM 1 MG PO TABS
0.5000 mg | ORAL_TABLET | Freq: Three times a day (TID) | ORAL | 0 refills | Status: DC | PRN
Start: 2020-08-01 — End: 2020-10-30

## 2020-08-01 MED ORDER — LISINOPRIL 40 MG PO TABS: 40.0000 mg | ORAL_TABLET | Freq: Every day | ORAL | 3 refills | Status: DC

## 2020-08-01 MED ORDER — MIRTAZAPINE 45 MG PO TABS: 45.0000 mg | ORAL_TABLET | Freq: Every day | ORAL | 0 refills | Status: DC

## 2020-08-01 NOTE — Patient Instructions (Signed)
You had labs performed today.  You will be contacted with the results of the labs once they are available, usually in the next 3 business days for routine lab work.  If you have an active my chart account, they will be released to your MyChart.  If you prefer to have these labs released to you via telephone, please let us know.  If you had a pap smear or biopsy performed, expect to be contacted in about 7-10 days.  Sugar is NOT controlled. See Raynelle Fanning for review of your [place] meter.

## 2020-08-01 NOTE — Progress Notes (Signed)
Subjective: CC: DM, GAD PCP: [name], DO QU:[id number] [name] is a 69 y.o. male presenting to clinic today for:  1. Type 2 Diabetes with hypertension, hyperlipidemia:  Patient's A1c was elevated last visit but this was thought to be secondary to prednisone for MSK issues.  No changes were made to his medication regimen.  He follows up today notes that blood sugars have been fairly controlled.  He notes that low has been in the 70s and high been in the low 200s.  Average fasting blood sugar has been around 125.  He is compliant with his Tresiba 80 units daily and Humalog 15 units 3 times daily with meals.  He has been taking his Metformin, lisinopril, Crestor and Jardiance.  Last eye exam: needs Last foot exam: done with Dr Irving Shows recently.  He was given a 44-month follow-up Last A1c:  Lab Results  Component Value Date   HGBA1C 7.5 (H) 04/13/2020   Nephropathy screen indicated?: UTD Last flu, zoster and/or pneumovax:  Immunization History  Administered Date(s) Administered   Fluad Quad(high Dose 65+) 04/13/2019   Influenza-Unspecified 05/24/2020   Moderna Sars-Covid-2 Vaccination 09/30/2019, 10/29/2019, 06/24/2020   Pneumococcal Conjugate-13 04/23/2018   Pneumococcal Polysaccharide-23 12/28/2015   Zoster 01/04/2016    ROS: Denies dizziness, LOC, polyuria, polydipsia, chest pain or shortness of breath  2. GAD Mirtazapine was increased to 30 mg daily last visit.  He was encouraged to use the Xanax only sparingly.  He admits that he uses it daily and on a couple of occasions used it twice daily.  He sleeps better when he takes Xanax at nighttime.  But he does admit that mirtazapine has helped.  He continues to have situational anxiety and religiously takes his Xanax when he goes to church on Wednesdays and Sundays.  Denies any excessive daytime sedation, falls, respiratory depression, visual /auditory hallucinations   ROS: Per HPI  No Known Allergies Past Medical  History:  Diagnosis Date   Anxiety    Arthritis    Diabetes mellitus without complication (HCC)    GERD (gastroesophageal reflux disease)    Hypercholesteremia    Hypertension     Current Outpatient Medications:    ACCU-CHEK AVIVA PLUS test strip, USE TO MONITOR BLOOD GLUCOSE 3 TIME(S) DAILY. DX E11.9, Disp: 300 strip, Rfl: 3   Accu-Chek Softclix Lancets lancets, Check BS TID Dx E11.9, Disp: 100 each, Rfl: 12   acetaZOLAMIDE (DIAMOX) 500 MG capsule, Take 1 capsule by mouth 2 (two) times a day., Disp: , Rfl:    Continuous Blood Gluc Receiver (FREESTYLE LIBRE READER) DEVI, Use to check glucose at least 4 times daily. Dx E11.40, Disp: 1 each, Rfl: 1   Continuous Blood Gluc Sensor (FREESTYLE LIBRE 14 DAY SENSOR) MISC, Scan to check glucose at least 4 times daily. DX: E11.4, Disp: 2 each, Rfl: 12   Difluprednate (DUREZOL) 0.05 % EMUL, Apply to eye., Disp: , Rfl:    dorzolamide (TRUSOPT) 2 % ophthalmic solution, Apply to eye., Disp: , Rfl:    empagliflozin (JARDIANCE) 25 MG TABS tablet, Take 25 mg by mouth daily before breakfast., Disp: 90 tablet, Rfl: 0   ergocalciferol (VITAMIN D2) 1.25 MG (50000 UT) capsule, Take 50,000 Units by mouth once a week., Disp: , Rfl:    famotidine (PEPCID) 20 MG tablet, TAKE 1 TABLET BY MOUTH TWICE A DAY, Disp: 180 tablet, Rfl: 3   insulin degludec (TRESIBA FLEXTOUCH) 200 UNIT/ML FlexTouch Pen, Inject 90 Units into the skin in the morning., Disp:  18 mL, Rfl: 5   insulin lispro (HUMALOG) 100 UNIT/ML KwikPen, Inject 15 Units into the skin 3 (three) times daily., Disp: , Rfl:    Insulin Pen Needle (B-D ULTRAFINE III SHORT PEN) 31G X 8 MM MISC, Use to give insulin daily Dx E11.9, Disp: 100 each, Rfl: 3   lactulose (CHRONULAC) 10 GM/15ML solution, TAKE 30 MLS (20 G TOTAL) BY MOUTH DAILY AS NEEDED FOR UP TO 8 DOSES FOR MILD CONSTIPATION., Disp: 720 mL, Rfl: 2   latanoprost (XALATAN) 0.005 % ophthalmic solution, Place 1 drop into both eyes at bedtime.,  Disp: , Rfl:    metFORMIN (GLUCOPHAGE-XR) 500 MG 24 hr tablet, TAKE 2 TABLETS BY MOUTH TWICE A DAY WITH FOOD, Disp: 360 tablet, Rfl: 0   mirtazapine (REMERON) 45 MG tablet, Take 1 tablet (45 mg total) by mouth at bedtime. PLEASE INFORM OF CHANGE IN PILL, Disp: 90 tablet, Rfl: 0   moxifloxacin (VIGAMOX) 0.5 % ophthalmic solution, Place 1 drop into the left eye 3 (three) times daily., Disp: , Rfl:    rosuvastatin (CRESTOR) 20 MG tablet, TAKE 1 TABLET BY MOUTH EVERYDAY AT BEDTIME, Disp: 90 tablet, Rfl: 1   ALPRAZolam (XANAX) 1 MG tablet, Take 0.5-1 tablets (0.5-1 mg total) by mouth 3 (three) times daily as needed for anxiety (take ONLY IF NEEDED)., Disp: 90 tablet, Rfl: 0   amLODipine (NORVASC) 5 MG tablet, Take 1 tablet (5 mg total) by mouth daily., Disp: 90 tablet, Rfl: 3   lisinopril (ZESTRIL) 40 MG tablet, Take 1 tablet (40 mg total) by mouth daily., Disp: 90 tablet, Rfl: 3 Social History   Socioeconomic History   Marital status: Divorced    Spouse name: Not on file   Number of children: Not on file   Years of education: Not on file   Highest education level: Not on file  Occupational History   Not on file  Tobacco Use   Smoking status: Former Smoker    Packs/day: 0.50    Years: 41.00    Pack years: 20.50    Types: Cigarettes    Quit date: 02/29/2012    Years since quitting: 8.4   Smokeless tobacco: Never Used  Vaping Use   Vaping Use: Never used  Substance and Sexual Activity   Alcohol use: No    Comment: quit 2012   Drug use: No    Comment: quit in 2012   Sexual activity: Yes    Birth control/protection: None  Other Topics Concern   Not on file  Social History Narrative   Not on file   Social Determinants of Health   Financial Resource Strain: Not on file  Food Insecurity: Not on file  Transportation Needs: Not on file  Physical Activity: Not on file  Stress: Not on file  Social Connections: Not on file  Intimate Partner Violence: Not on file    Family History  Problem Relation Age of Onset   Diabetes Mother    Glaucoma Mother     Objective: Office vital signs reviewed. BP (!) 147/77 Comment: manual   Pulse 85    Temp 98.1 F (36.7 Charles) (Temporal)    Ht 6\' 1"  (1.854 m)    Wt 296 lb (134.3 kg)    SpO2 96%    BMI 39.05 kg/m   Physical Examination:  General: Awake, alert, well nourished, No acute distress HEENT: Normal; moist mucous membranes Cardio: regular rate and rhythm, S1S2 heard, no murmurs appreciated Pulm: clear to auscultation bilaterally, no wheezes, rhonchi  or rales; normal work of breathing on room air GI: Obese Psych: Mood stable, speech normal, affect appropriate, good eye contact.  Does not appear to be responding to internal stimuli  Depression screen [facility] 2/9 08/01/2020 04/14/2020 04/04/2020  Decreased Interest 0 0 0  Down, Depressed, Hopeless 1 0 1  PHQ - 2 Score 1 0 1  Altered sleeping 1 - -  Tired, decreased energy 0 - -  Change in appetite 0 - -  Feeling bad or failure about yourself  0 - -  Trouble concentrating 1 - -  Moving slowly or fidgety/restless 0 - -  Suicidal thoughts 0 - -  PHQ-9 Score 3 - -  Difficult doing work/chores - - -   GAD 7 : Generalized Anxiety Score 08/01/2020 01/12/2020 04/06/2019  Nervous, Anxious, on Edge 2 3 2   Control/stop worrying 2 1 1   Worry too much - different things 2 1 2   Trouble relaxing 1 3 1   Restless 0 1 0  Easily annoyed or irritable 1 1 1   Afraid - awful might happen 1 0 1  Total GAD 7 Score 9 10 8   Anxiety Difficulty Somewhat difficult Somewhat difficult -    Assessment/ Plan: 69 y.o. 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  It has arisen from 7.5-8.5 today despite reports of fairly good sugar control at home.  He unfortunately did not have his reader with him today and we could not review his actual blood sugars.  I scheduled him an appointment with Almyra Free to review these in the next week or so.  I wonder if he is having postprandial spikes and not recognizing these.  We may need to consider increasing his Humalog.  Foot exam was performed recently by his podiatrist.  Release of information form was completed today as he did not want to have this repeated here in the office  Recheck of blood pressure manually was within acceptable range  Continue statin  Anxiety disorder is somewhat uncontrolled.  He still having a fairly regular need for the Xanax.  I again reiterated taking this only if absolutely needed and not utilizing it for sleep as that is not the intended purpose of the medication.  He voiced good understanding.  We have increased his mirtazapine to 45 mg daily.  He is aware of the medication change and will follow up in 3 months  The Narcotic Database has been reviewed.  There were no red flags.     Orders Placed This Encounter  Procedures   Bayer DCA Hb A1c Waived   Meds ordered this encounter  Medications   ALPRAZolam (XANAX) 1 MG tablet    Sig: Take 0.5-1 tablets (0.5-1 mg total) by mouth 3 (three) times daily as needed for anxiety (take ONLY IF NEEDED).    Dispense:  90 tablet    Refill:  0   amLODipine (NORVASC) 5 MG tablet    Sig: Take 1 tablet (5 mg total) by mouth daily.    Dispense:  90 tablet    Refill:  3   lisinopril (ZESTRIL) 40 MG tablet    Sig: Take 1 tablet (  40 mg total) by mouth daily.    Dispense:  90 tablet    Refill:  3   mirtazapine (REMERON) 45 MG tablet    Sig: Take 1 tablet (45 mg total) by mouth at bedtime. PLEASE INFORM OF CHANGE IN PILL    Dispense:  90 tablet    Refill:  0     [name]  Windell Moulding, DO [place] [phone]

## 2020-08-04 ENCOUNTER — Other Ambulatory Visit: Payer: Self-pay | Admitting: Family Medicine

## 2020-08-11 ENCOUNTER — Ambulatory Visit: Payer: Medicare Other | Admitting: Pharmacist

## 2020-08-11 ENCOUNTER — Other Ambulatory Visit: Payer: Self-pay

## 2020-08-15 ENCOUNTER — Telehealth: Payer: Self-pay | Admitting: Pharmacist

## 2020-08-15 ENCOUNTER — Other Ambulatory Visit: Payer: Self-pay | Admitting: Family Medicine

## 2020-08-15 ENCOUNTER — Ambulatory Visit (INDEPENDENT_AMBULATORY_CARE_PROVIDER_SITE_OTHER): Payer: Medicare Other | Admitting: Pharmacist

## 2020-08-15 ENCOUNTER — Other Ambulatory Visit: Payer: Self-pay

## 2020-08-15 DIAGNOSIS — E119 Type 2 diabetes mellitus without complications: Secondary | ICD-10-CM

## 2020-08-15 MED ORDER — SILDENAFIL CITRATE 50 MG PO TABS: 50.0000 mg | ORAL_TABLET | Freq: Every day | ORAL | 2 refills | Status: DC | PRN

## 2020-08-15 NOTE — Progress Notes (Signed)
    08/15/2020 Name: [name] MRN: [id number] DOB: 02-06-1951   S:  78 yoM Presents for diabetes evaluation, education, and management Patient was referred and last seen by Primary Care Provider on 08/01/20.  Insurance coverage/medication affordability: [employer] medicare  Patient reports adherence with medications.  Current diabetes medications include: TRESIBA HUMALOG, jardiance, metformin  Current hypertension medications include: lisinopril, amlodipine Goal 130/80  Current hyperlipidemia medications include: rosuvastatin  Patient denies hypoglycemic events.    O:  Lab Results  Component Value Date   HGBA1C 8.5 (H) 08/01/2020      Lipid Panel     Component Value Date/Time   CHOL 116 04/13/2020 0906   TRIG 111 04/13/2020 0906   HDL 43 04/13/2020 0906   CHOLHDL 2.7 04/13/2020 0906   LDLCALC 53 04/13/2020 0906     A/P:  Diabetes T2DM currently uncontrolled. Patient is adherent with medication. Control is suboptimal due to dietary changes--patient admits to eating more sweets.  Patient is only in target range 32% of the time.  Reviewed libre and discussed changes with patient, including dietary  -May increase tresiba to 82 units daily  -Meal time insulin to 18 units 3x daily with meals  -Continue all other medications as prescribed  -Libre sample applied while patient awaiting shipment from [place] discussed pathophysiology of diabetes, recommended lifestyle interventions, dietary effects on blood sugar control  -Counseled on s/sx of and management of hypoglycemia  -Next A1C anticipated 3 months.    Written patient instructions provided.  Total time in face to face counseling 25 minutes.   Follow up PCP Clinic Visit in 3 months.    Regina Eck, PharmD, BCPS Clinical Pharmacist, [place]  [facility] Phone [phone]

## 2020-08-15 NOTE — Telephone Encounter (Signed)
Left message that rx requested was sent.

## 2020-08-15 NOTE — Telephone Encounter (Signed)
Patient requesting generic viagra prescription #20 Send to [facility] (since cheapest there) He has not taken in years--so may need appt; tolerated well when he was on it Just passing message along

## 2020-08-16 MED ORDER — SILDENAFIL CITRATE 50 MG PO TABS: 50.0000 mg | ORAL_TABLET | Freq: Every day | ORAL | 2 refills | Status: AC | PRN

## 2020-08-16 NOTE — Telephone Encounter (Signed)
Sent RX to [facility] per patient request

## 2020-08-21 DIAGNOSIS — E1140 Type 2 diabetes mellitus with diabetic neuropathy, unspecified: Secondary | ICD-10-CM | POA: Diagnosis not present

## 2020-08-21 DIAGNOSIS — Z794 Long term (current) use of insulin: Secondary | ICD-10-CM | POA: Diagnosis not present

## 2020-08-23 ENCOUNTER — Encounter (INDEPENDENT_AMBULATORY_CARE_PROVIDER_SITE_OTHER): Payer: Self-pay

## 2020-08-30 ENCOUNTER — Encounter (INDEPENDENT_AMBULATORY_CARE_PROVIDER_SITE_OTHER): Payer: Self-pay | Admitting: *Deleted

## 2020-08-31 ENCOUNTER — Other Ambulatory Visit: Payer: Self-pay | Admitting: *Deleted

## 2020-09-15 ENCOUNTER — Other Ambulatory Visit: Payer: Self-pay | Admitting: *Deleted

## 2020-09-15 ENCOUNTER — Telehealth: Payer: Self-pay

## 2020-09-15 NOTE — Telephone Encounter (Signed)
Left message for patient to call back.  We have received patient assistance medications for him:  6 - Tresiba 12 - Levemir  Medications were placed in the refrigerator in the lab with patient's name on bag and packing slip inside bag.

## 2020-09-15 NOTE — Telephone Encounter (Signed)
Supposed to be on tresiba. NOT levemir.

## 2020-09-15 NOTE — Telephone Encounter (Signed)
Pt returned missed call. Says from his understanding he was supposed to be taken off of the Levemir and start taking the [place]. Wants clarification on this. Also said he would come by the office to pick up the [place].

## 2020-09-20 ENCOUNTER — Other Ambulatory Visit: Payer: Self-pay | Admitting: *Deleted

## 2020-09-29 DIAGNOSIS — H8302 Labyrinthitis, left ear: Secondary | ICD-10-CM | POA: Diagnosis not present

## 2020-09-29 DIAGNOSIS — R42 Dizziness and giddiness: Secondary | ICD-10-CM | POA: Diagnosis not present

## 2020-10-01 ENCOUNTER — Other Ambulatory Visit: Payer: Self-pay | Admitting: Family Medicine

## 2020-10-01 DIAGNOSIS — K5903 Drug induced constipation: Secondary | ICD-10-CM

## 2020-10-18 ENCOUNTER — Other Ambulatory Visit: Payer: Self-pay | Admitting: Family Medicine

## 2020-10-23 ENCOUNTER — Other Ambulatory Visit: Payer: Self-pay | Admitting: Family Medicine

## 2020-10-23 DIAGNOSIS — H401133 Primary open-angle glaucoma, bilateral, severe stage: Secondary | ICD-10-CM | POA: Diagnosis not present

## 2020-10-30 ENCOUNTER — Other Ambulatory Visit: Payer: Self-pay | Admitting: Family Medicine

## 2020-10-30 ENCOUNTER — Encounter: Payer: Self-pay | Admitting: Family Medicine

## 2020-10-30 ENCOUNTER — Ambulatory Visit (INDEPENDENT_AMBULATORY_CARE_PROVIDER_SITE_OTHER): Payer: Medicare Other | Admitting: Family Medicine

## 2020-10-30 ENCOUNTER — Other Ambulatory Visit: Payer: Self-pay

## 2020-10-30 VITALS — BP 117/67 | HR 95 | Temp 98.1°F | Wt 302.0 lb

## 2020-10-30 DIAGNOSIS — H6690 Otitis media, unspecified, unspecified ear: Secondary | ICD-10-CM | POA: Diagnosis not present

## 2020-10-30 DIAGNOSIS — I152 Hypertension secondary to endocrine disorders: Secondary | ICD-10-CM

## 2020-10-30 DIAGNOSIS — E785 Hyperlipidemia, unspecified: Secondary | ICD-10-CM

## 2020-10-30 DIAGNOSIS — E113553 Type 2 diabetes mellitus with stable proliferative diabetic retinopathy, bilateral: Secondary | ICD-10-CM | POA: Diagnosis not present

## 2020-10-30 DIAGNOSIS — F411 Generalized anxiety disorder: Secondary | ICD-10-CM | POA: Diagnosis not present

## 2020-10-30 DIAGNOSIS — E119 Type 2 diabetes mellitus without complications: Secondary | ICD-10-CM | POA: Diagnosis not present

## 2020-10-30 DIAGNOSIS — E1159 Type 2 diabetes mellitus with other circulatory complications: Secondary | ICD-10-CM | POA: Diagnosis not present

## 2020-10-30 DIAGNOSIS — E1169 Type 2 diabetes mellitus with other specified complication: Secondary | ICD-10-CM | POA: Diagnosis not present

## 2020-10-30 DIAGNOSIS — Z794 Long term (current) use of insulin: Secondary | ICD-10-CM | POA: Diagnosis not present

## 2020-10-30 LAB — BAYER DCA HB A1C WAIVED: HB A1C (BAYER DCA - WAIVED): 8.9 % — ABNORMAL HIGH (ref <7.0)

## 2020-10-30 MED ORDER — ALPRAZOLAM 1 MG PO TABS: 0.5000 mg | ORAL_TABLET | Freq: Three times a day (TID) | ORAL | 1 refills | Status: DC | PRN

## 2020-10-30 MED ORDER — CEFDINIR 300 MG PO CAPS: 300.0000 mg | ORAL_CAPSULE | Freq: Two times a day (BID) | ORAL | 0 refills | Status: DC

## 2020-10-30 NOTE — Patient Instructions (Addendum)
Your sugar continues to go up.  I am referring you to a sugar specialist in [place] to see if they can help [place] get you in a safer range.  Go up to 84 units of tresiba nightly for now.   How to Perform the Epley Maneuver The Epley maneuver is an exercise that relieves symptoms of vertigo. Vertigo is the feeling that you or your surroundings are moving when they are not. When you feel vertigo, you may feel like the room is spinning and may have trouble walking. The Epley maneuver is used for a type of vertigo caused by a calcium deposit in a part of the inner ear. The maneuver involves changing head positions to help the deposit move out of the area. You can do this maneuver at home whenever you have symptoms of vertigo. You can repeat it in 24 hours if your vertigo has not gone away. Even though the Epley maneuver may relieve your vertigo for a few weeks, it is possible that your symptoms will return. This maneuver relieves vertigo, but it does not relieve dizziness. What are the risks? If it is done correctly, the Epley maneuver is considered safe. Sometimes it can lead to dizziness or nausea that goes away after a short time. If you develop other symptoms--such as changes in vision, weakness, or numbness--stop doing the maneuver and call your health care provider. Supplies needed:  A bed or table.  A pillow. How to do the Epley maneuver 1. Sit on the edge of a bed or table with your back straight and your legs extended or hanging over the edge of the bed or table. 2. Turn your head halfway toward the affected ear or side as told by your health care provider. 3. Lie backward quickly with your head turned until you are lying flat on your back. You may want to position a pillow under your shoulders. 4. Hold this position for at least 30 seconds. If you feel dizzy or have symptoms of vertigo, continue to hold the position until the symptoms stop. 5. Turn your head to the opposite direction  until your unaffected ear is facing the floor. 6. Hold this position for at least 30 seconds. If you feel dizzy or have symptoms of vertigo, continue to hold the position until the symptoms stop. 7. Turn your whole body to the same side as your head so that you are positioned on your side. Your head will now be nearly facedown. Hold for at least 30 seconds. If you feel dizzy or have symptoms of vertigo, continue to hold the position until the symptoms stop. 8. Sit back up. You can repeat the maneuver in 24 hours if your vertigo does not go away.      Follow these instructions at home: For 24 hours after doing the Epley maneuver:  Keep your head in an upright position.  When lying down to sleep or rest, keep your head raised (elevated) with two or more pillows.  Avoid excessive neck movements. Activity  Do not drive or use machinery if you feel dizzy.  After doing the Epley maneuver, return to your normal activities as told by your health care provider. Ask your health care provider what activities are safe for you. General instructions  Drink enough fluid to keep your urine pale yellow.  Do not drink alcohol.  Take over-the-counter and prescription medicines only as told by your health care provider.  Keep all follow-up visits as told by your health care provider.  This is important. Preventing vertigo symptoms Ask your health care provider if there is anything you should do at home to prevent vertigo. He or she may recommend that you:  Keep your head elevated with two or more pillows while you sleep.  Do not sleep on the side of your affected ear.  Get up slowly from bed.  Avoid sudden movements during the day.  Avoid extreme head positions or movement, such as looking up or bending over. Contact a health care provider if:  Your vertigo gets worse.  You have other symptoms, including: ? Nausea. ? Vomiting. ? Headache. Get help right away if you:  Have vision  changes.  Have a headache or neck pain that is severe or getting worse.  Cannot stop vomiting.  Have new numbness or weakness in any part of your body. Summary  Vertigo is the feeling that you or your surroundings are moving when they are not.  The Epley maneuver is an exercise that relieves symptoms of vertigo.  If the Epley maneuver is done correctly, it is considered safe and relieves vertigo quickly. This information is not intended to replace advice given to you by your health care provider. Make sure you discuss any questions you have with your health care provider. Document Revised: 05/19/2019 Document Reviewed: 05/19/2019 Elsevier Patient Education  2021 [employer].

## 2020-10-30 NOTE — Progress Notes (Signed)
Subjective: CC: DM PCP: [name], DO QUI:[id number] [name] is a 70 y.o. male presenting to clinic today for:  1. Type 2 Diabetes with hypertension, hyperlipidemia, retinopathy:  [name] was increased to 82 units daily and mealtime insulin 18u TID.  He was continued on Metformin, Jardiance and BP meds.  He reports compliance with this regimen.  He admits that his blood sugars have fluctuated quite a bit.  The lowest he seen is 15 [place] "slacked off on his sodas".  He reports vision is stable and in fact he was recently taken off of his eyedrops.  He will be seeing Dr. Zadie Rhine in [place] for retinopathy.  No chest pain, shortness of breath, penile discharge, dysuria or discoloration.  Last eye exam: Up-to-date.  Has known retinopathy Last foot exam: UTD Last A1c:  Lab Results  Component Value Date   HGBA1C 8.5 (H) 08/01/2020   Nephropathy screen indicated?: UTD Last flu, zoster and/or pneumovax:  Immunization History  Administered Date(s) Administered  . Fluad Quad(high Dose 65+) 04/30/2017, 04/13/2019  . Influenza Split 04/19/2014, 05/29/2020  . Influenza-Unspecified 05/24/2020  . Moderna Sars-Covid-2 Vaccination 09/30/2019, 10/29/2019, 06/24/2020  . Pneumococcal Conjugate-13 04/23/2018  . Pneumococcal Polysaccharide-23 12/28/2015  . Zoster 01/04/2016    2.  Ear fullness/dizziness Patient reports that he was diagnosed with an ear infection on the left a couple of weeks ago.  He has had about a month history of vertigo which she describes as dizziness/room spinning when he lays down and goes to roll over.  No nausea, vomiting, hearing difficulty or falls.  No fevers  3.  Anxiety disorder Patient is chronically treated with alprazolam up to 1 mg 3 times daily as needed anxiety.  Last refill was 3 months ago.  Denies any falls, respiratory depression, visual or auditory hallucinations.  Anxiety typically situational and exacerbated by leaving the home.  ROS: Per  HPI  No Known Allergies Past Medical History:  Diagnosis Date  . Anxiety   . Arthritis   . Diabetes mellitus without complication ([place])   . GERD (gastroesophageal reflux disease)   . Hypercholesteremia   . Hypertension     Current Outpatient Medications:  .  ACCU-CHEK AVIVA PLUS test strip, USE TO MONITOR BLOOD GLUCOSE 3 TIME(S) DAILY. DX E11.9, Disp: 300 strip, Rfl: 3 .  Accu-Chek Softclix Lancets lancets, Check BS TID Dx E11.9, Disp: 100 each, Rfl: 12 .  acetaZOLAMIDE (DIAMOX) 500 MG capsule, Take 1 capsule by mouth 2 (two) times a day., Disp: , Rfl:  .  ALPRAZolam (XANAX) 1 MG tablet, Take 0.5-1 tablets (0.5-1 mg total) by mouth 3 (three) times daily as needed for anxiety (take ONLY IF NEEDED)., Disp: 90 tablet, Rfl: 0 .  amLODipine (NORVASC) 5 MG tablet, Take 1 tablet (5 mg total) by mouth daily., Disp: 90 tablet, Rfl: 3 .  Continuous Blood Gluc Receiver (FREESTYLE LIBRE READER) DEVI, Use to check glucose at least 4 times daily. Dx E11.40, Disp: 1 each, Rfl: 1 .  Continuous Blood Gluc Sensor (FREESTYLE LIBRE 14 DAY SENSOR) MISC, Scan to check glucose at least 4 times daily. DX: E11.4, Disp: 2 each, Rfl: 12 .  Difluprednate (DUREZOL) 0.05 % EMUL, Apply to eye., Disp: , Rfl:  .  dorzolamide (TRUSOPT) 2 % ophthalmic solution, Apply to eye., Disp: , Rfl:  .  empagliflozin (JARDIANCE) 25 MG TABS tablet, Take 25 mg by mouth daily before breakfast., Disp: 90 tablet, Rfl: 0 .  ergocalciferol (VITAMIN D2) 1.25 MG (50000 UT) capsule,  Take 50,000 Units by mouth once a week., Disp: , Rfl:  .  famotidine (PEPCID) 20 MG tablet, TAKE 1 TABLET BY MOUTH TWICE A DAY, Disp: 180 tablet, Rfl: 3 .  insulin degludec (TRESIBA FLEXTOUCH) 200 UNIT/ML FlexTouch Pen, Inject 90 Units into the skin in the morning., Disp: 18 mL, Rfl: 5 .  insulin lispro (HUMALOG) 100 UNIT/ML KwikPen, Inject 15 Units into the skin 3 (three) times daily., Disp: , Rfl:  .  Insulin Pen Needle (B-D ULTRAFINE III SHORT PEN) 31G X 8 MM  MISC, Use to give insulin daily Dx E11.9, Disp: 100 each, Rfl: 3 .  lactulose (CHRONULAC) 10 GM/15ML solution, TAKE 30 MLS (20 G TOTAL) BY MOUTH DAILY AS NEEDED FOR UP TO 8 DOSES FOR MILD CONSTIPATION., Disp: 720 mL, Rfl: 0 .  latanoprost (XALATAN) 0.005 % ophthalmic solution, Place 1 drop into both eyes at bedtime., Disp: , Rfl:  .  lisinopril (ZESTRIL) 40 MG tablet, Take 1 tablet (40 mg total) by mouth daily., Disp: 90 tablet, Rfl: 3 .  metFORMIN (GLUCOPHAGE-XR) 500 MG 24 hr tablet, TAKE 2 TABLETS BY MOUTH TWICE A DAY WITH FOOD, Disp: 360 tablet, Rfl: 0 .  mirtazapine (REMERON) 45 MG tablet, TAKE 1 TABLET (45 MG TOTAL) BY MOUTH AT BEDTIME. PLEASE INFORM OF CHANGE IN PILL, Disp: 90 tablet, Rfl: 0 .  moxifloxacin (VIGAMOX) 0.5 % ophthalmic solution, Place 1 drop into the left eye 3 (three) times daily., Disp: , Rfl:  .  rosuvastatin (CRESTOR) 20 MG tablet, TAKE 1 TABLET BY MOUTH EVERYDAY AT BEDTIME, Disp: 90 tablet, Rfl: 0 .  sildenafil (VIAGRA) 50 MG tablet, Take 1 tablet (50 mg total) by mouth daily as needed for erectile dysfunction., Disp: 10 tablet, Rfl: 2 Social History   Socioeconomic History  . Marital status: Divorced    Spouse name: Not on file  . Number of children: Not on file  . Years of education: Not on file  . Highest education level: Not on file  Occupational History  . Not on file  Tobacco Use  . Smoking status: Former Smoker    Packs/day: 0.50    Years: 41.00    Pack years: 20.50    Types: Cigarettes    Quit date: 02/29/2012    Years since quitting: 8.6  . Smokeless tobacco: Never Used  Vaping Use  . Vaping Use: Never used  Substance and Sexual Activity  . Alcohol use: No    Comment: quit 2012  . Drug use: No    Comment: quit in 2012  . Sexual activity: Yes    Birth control/protection: None  Other Topics Concern  . Not on file  Social History Narrative  . Not on file   Social Determinants of Health   Financial Resource Strain: Not on file  Food  Insecurity: Not on file  Transportation Needs: Not on file  Physical Activity: Not on file  Stress: Not on file  Social Connections: Not on file  Intimate Partner Violence: Not on file   Family History  Problem Relation Age of Onset  . Diabetes Mother   . Glaucoma Mother     Objective: Office vital signs reviewed. BP 117/67   Pulse 95   Temp 98.1 F (36.7 Charles) (Temporal)   Wt (!) 302 lb (137 kg)   SpO2 98%   BMI 39.84 kg/m   Physical Examination:  General: Awake, alert, well nourished, No acute distress HEENT: Left TM intact but with appreciable purulent effusion.  No significant  erythema, bulging. Cardio: regular rate and rhythm, S1S2 heard, no murmurs appreciated Pulm: clear to auscultation bilaterally, no wheezes, rhonchi or rales; normal work of breathing on room air Extremities: warm, well perfused, No edema, cyanosis or clubbing; +2 pulses bilaterally MSK: Ambulates with use of cane Psych: Mood stable, speech normal, affect appropriate  Assessment/ Plan: 70 y.o. 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  I placed a referral to endocrinology for further assistance.  In the meantime I have advised him to increase his insulin to 84 units nightly and continue mealtime insulin as discussed.  Reinforced need to discontinue sugary beverages and foods  Continue statin  Blood pressures well controlled.  Continue current regimen  Anxiety stable.  National  narcotic database was reviewed and there were no red flags  Left ear with appreciable effusion that is opaque.  Suspect subacute otitis media.  Cefdinir prescribed.  Home instructions for Epley maneuver provided as well.  If no significant improvement within the next week, plan for referral to vestibular rehab.  He will contact me  Orders Placed This Encounter  Procedures  . CMP14+EGFR  . Bayer DCA Hb A1c Waived   No orders of the defined types were placed in this encounter.    [name], DO [place] [phone]

## 2020-10-31 LAB — CMP14+EGFR
ALT: 20 IU/L (ref 0–44)
AST: 26 IU/L (ref 0–40)
Albumin/Globulin Ratio: 1.1 — ABNORMAL LOW (ref 1.2–2.2)
Albumin: 4.1 g/dL (ref 3.8–4.8)
Alkaline Phosphatase: 102 IU/L (ref 44–121)
BUN/Creatinine Ratio: 14 (ref 10–24)
BUN: 17 mg/dL (ref 8–27)
Bilirubin Total: 0.3 mg/dL (ref 0.0–1.2)
CO2: 20 mmol/L (ref 20–29)
Calcium: 10.1 mg/dL (ref 8.6–10.2)
Chloride: 106 mmol/L (ref 96–106)
Creatinine, Ser: 1.18 mg/dL (ref 0.76–1.27)
Globulin, Total: 3.6 g/dL (ref 1.5–4.5)
Glucose: 196 mg/dL — ABNORMAL HIGH (ref 65–99)
Potassium: 4.8 mmol/L (ref 3.5–5.2)
Sodium: 142 mmol/L (ref 134–144)
Total Protein: 7.7 g/dL (ref 6.0–8.5)
eGFR: 66 mL/min/{1.73_m2} (ref >59)

## 2020-11-07 ENCOUNTER — Telehealth: Payer: Self-pay

## 2020-11-07 NOTE — Telephone Encounter (Signed)
Pt aware  - libre samples up front for pick up

## 2020-11-15 ENCOUNTER — Encounter (INDEPENDENT_AMBULATORY_CARE_PROVIDER_SITE_OTHER): Payer: Medicare Other | Admitting: Ophthalmology

## 2020-11-21 ENCOUNTER — Ambulatory Visit: Payer: Medicare Other | Admitting: Nurse Practitioner

## 2020-11-21 ENCOUNTER — Encounter: Payer: Self-pay | Admitting: Nurse Practitioner

## 2020-11-21 ENCOUNTER — Other Ambulatory Visit: Payer: Self-pay

## 2020-11-21 VITALS — BP 140/69 | HR 69 | Ht 73.0 in | Wt 305.0 lb

## 2020-11-21 DIAGNOSIS — E782 Mixed hyperlipidemia: Secondary | ICD-10-CM

## 2020-11-21 DIAGNOSIS — E1122 Type 2 diabetes mellitus with diabetic chronic kidney disease: Secondary | ICD-10-CM | POA: Diagnosis not present

## 2020-11-21 DIAGNOSIS — I10 Essential (primary) hypertension: Secondary | ICD-10-CM

## 2020-11-21 DIAGNOSIS — E1140 Type 2 diabetes mellitus with diabetic neuropathy, unspecified: Secondary | ICD-10-CM | POA: Diagnosis not present

## 2020-11-21 DIAGNOSIS — Z794 Long term (current) use of insulin: Secondary | ICD-10-CM | POA: Diagnosis not present

## 2020-11-21 DIAGNOSIS — N182 Chronic kidney disease, stage 2 (mild): Secondary | ICD-10-CM

## 2020-11-21 MED ORDER — METFORMIN HCL ER 500 MG PO TB24: 1000.0000 mg | ORAL_TABLET | Freq: Every day | ORAL | 3 refills | Status: DC

## 2020-11-21 NOTE — Patient Instructions (Signed)
Diabetes Mellitus and Nutrition, Adult When you have diabetes, or diabetes mellitus, it is very important to have healthy eating habits because your blood sugar (glucose) levels are greatly affected by what you eat and drink. Eating healthy foods in the right amounts, at about the same times every day, can help you:  Control your blood glucose.  Lower your risk of heart disease.  Improve your blood pressure.  Reach or maintain a healthy weight. What can affect my meal plan? Every person with diabetes is different, and each person has different needs for a meal plan. Your health care provider may recommend that you work with a dietitian to make a meal plan that is best for you. Your meal plan may vary depending on factors such as:  The calories you need.  The medicines you take.  Your weight.  Your blood glucose, blood pressure, and cholesterol levels.  Your activity level.  Other health conditions you have, such as heart or kidney disease. How do carbohydrates affect me? Carbohydrates, also called carbs, affect your blood glucose level more than any other type of food. Eating carbs naturally raises the amount of glucose in your blood. Carb counting is a method for keeping track of how many carbs you eat. Counting carbs is important to keep your blood glucose at a healthy level, especially if you use insulin or take certain oral diabetes medicines. It is important to know how many carbs you can safely have in each meal. This is different for every person. Your dietitian can help you calculate how many carbs you should have at each meal and for each snack. How does alcohol affect me? Alcohol can cause a sudden decrease in blood glucose (hypoglycemia), especially if you use insulin or take certain oral diabetes medicines. Hypoglycemia can be a life-threatening condition. Symptoms of hypoglycemia, such as sleepiness, dizziness, and confusion, are similar to symptoms of having too much  alcohol.  Do not drink alcohol if: ? Your health care provider tells you not to drink. ? You are pregnant, may be pregnant, or are planning to become pregnant.  If you drink alcohol: ? Do not drink on an empty stomach. ? Limit how much you use to:  0-1 drink a day for women.  0-2 drinks a day for men. ? Be aware of how much alcohol is in your drink. In the [place] one drink equals one 12 oz bottle of beer (355 mL), one 5 oz glass of wine (148 mL), or one 1 oz glass of hard liquor (44 mL). ? Keep yourself hydrated with water, diet soda, or unsweetened iced tea.  Keep in mind that regular soda, juice, and other mixers may contain a lot of sugar and must be counted as carbs. What are tips for following this plan? Reading food labels  Start by checking the serving size on the "Nutrition Facts" label of packaged foods and drinks. The amount of calories, carbs, fats, and other nutrients listed on the label is based on one serving of the item. Many items contain more than one serving per package.  Check the total grams (g) of carbs in one serving. You can calculate the number of servings of carbs in one serving by dividing the total carbs by 15. For example, if a food has 30 g of total carbs per serving, it would be equal to 2 servings of carbs.  Check the number of grams (g) of saturated fats and trans fats in one serving. Choose foods that have   a low amount or none of these fats.  Check the number of milligrams (mg) of salt (sodium) in one serving. Most people should limit total sodium intake to less than 2,300 mg per day.  Always check the nutrition information of foods labeled as "low-fat" or "nonfat." These foods may be higher in added sugar or refined carbs and should be avoided.  Talk to your dietitian to identify your daily goals for nutrients listed on the label. Shopping  Avoid buying canned, pre-made, or processed foods. These foods tend to be high in fat, sodium, and added  sugar.  Shop around the outside edge of the grocery store. This is where you will most often find fresh fruits and vegetables, bulk grains, fresh meats, and fresh dairy. Cooking  Use low-heat cooking methods, such as baking, instead of high-heat cooking methods like deep frying.  Cook using healthy oils, such as olive, canola, or sunflower oil.  Avoid cooking with butter, cream, or high-fat meats. Meal planning  Eat meals and snacks regularly, preferably at the same times every day. Avoid going long periods of time without eating.  Eat foods that are high in fiber, such as fresh fruits, vegetables, beans, and whole grains. Talk with your dietitian about how many servings of carbs you can eat at each meal.  Eat 4-6 oz (112-168 g) of lean protein each day, such as lean meat, chicken, fish, eggs, or tofu. One ounce (oz) of lean protein is equal to: ? 1 oz (28 g) of meat, chicken, or fish. ? 1 egg. ?  cup (62 g) of tofu.  Eat some foods each day that contain healthy fats, such as avocado, nuts, seeds, and fish.   What foods should I eat? Fruits Berries. Apples. Oranges. Peaches. Apricots. Plums. Grapes. Mango. Papaya. Pomegranate. Kiwi. Cherries. Vegetables Lettuce. Spinach. Leafy greens, including kale, chard, collard greens, and mustard greens. Beets. Cauliflower. Cabbage. Broccoli. Carrots. Green beans. Tomatoes. Peppers. Onions. Cucumbers. Brussels sprouts. Grains Whole grains, such as whole-wheat or whole-grain bread, crackers, tortillas, cereal, and pasta. Unsweetened oatmeal. Quinoa. Brown or wild rice. Meats and other proteins Seafood. Poultry without skin. Lean cuts of poultry and beef. Tofu. Nuts. Seeds. Dairy Low-fat or fat-free dairy products such as milk, yogurt, and cheese. The items listed above may not be a complete list of foods and beverages you can eat. Contact a dietitian for more information. What foods should I avoid? Fruits Fruits canned with  syrup. Vegetables Canned vegetables. Frozen vegetables with butter or cream sauce. Grains Refined white flour and flour products such as bread, pasta, snack foods, and cereals. Avoid all processed foods. Meats and other proteins Fatty cuts of meat. Poultry with skin. Breaded or fried meats. Processed meat. Avoid saturated fats. Dairy Full-fat yogurt, cheese, or milk. Beverages Sweetened drinks, such as soda or iced tea. The items listed above may not be a complete list of foods and beverages you should avoid. Contact a dietitian for more information. Questions to ask a health care provider  Do I need to meet with a diabetes educator?  Do I need to meet with a dietitian?  What number can I call if I have questions?  When are the best times to check my blood glucose? Where to find more information:  American Diabetes Association: diabetes.org  Academy of Nutrition and Dietetics: www.eatright.org  National Institute of Diabetes and Digestive and Kidney Diseases: www.niddk.nih.gov  Association of Diabetes Care and Education Specialists: www.diabeteseducator.org Summary  It is important to have healthy eating   habits because your blood sugar (glucose) levels are greatly affected by what you eat and drink.  A healthy meal plan will help you control your blood glucose and maintain a healthy lifestyle.  Your health care provider may recommend that you work with a dietitian to make a meal plan that is best for you.  Keep in mind that carbohydrates (carbs) and alcohol have immediate effects on your blood glucose levels. It is important to count carbs and to use alcohol carefully. This information is not intended to replace advice given to you by your health care provider. Make sure you discuss any questions you have with your health care provider. Document Revised: 06/29/2019 Document Reviewed: 06/29/2019 Elsevier Patient Education  2021 [employer]  

## 2020-11-21 NOTE — Progress Notes (Signed)
Endocrinology Consult Note       11/21/2020, 3:00 PM   Subjective:    Patient ID: [name], male    DOB: May 09, 1951.  [name] is being seen in consultation for management of currently uncontrolled symptomatic diabetes requested by  Charles Norlander, DO.   Past Medical History:  Diagnosis Date  . Anxiety   . Arthritis   . Diabetes mellitus without complication ([place])   . GERD (gastroesophageal reflux disease)   . Hypercholesteremia   . Hypertension     Past Surgical History:  Procedure Laterality Date  . boils     removed form back of skull  . CATARACT EXTRACTION W/PHACO Right 04/02/2013   Procedure: CATARACT EXTRACTION PHACO AND INTRAOCULAR LENS PLACEMENT ([place]);  Surgeon: Williams Che, MD;  Location: [facility];  Service: Ophthalmology;  Laterality: Right;  CDE 7.00  . CATARACT EXTRACTION W/PHACO Left 10/04/2013   Procedure: CATARACT EXTRACTION PHACO AND INTRAOCULAR LENS PLACEMENT ([facility]);  Surgeon: Williams Che, MD;  Location: [facility];  Service: Ophthalmology;  Laterality: Left;  CDE:1.46  . COLONOSCOPY N/A 07/03/2015   Procedure: COLONOSCOPY;  Surgeon: Rogene Houston, MD;  Location: [facility];  Service: Endoscopy;  Laterality: N/A;  830  . EYE SURGERY Right    "valve replaced and stent placed" per pt to reroute the fluid in his eye  . ROTATOR CUFF REPAIR Right     Social History   Socioeconomic History  . Marital status: Divorced    Spouse name: Not on file  . Number of children: Not on file  . Years of education: Not on file  . Highest education level: Not on file  Occupational History  . Not on file  Tobacco Use  . Smoking status: Former Smoker    Packs/day: 0.50    Years: 41.00    Pack years: 20.50    Types: Cigarettes    Quit date: 02/29/2012    Years since quitting: 8.7  . Smokeless tobacco: Never Used  Vaping Use  . Vaping Use: Never used  Substance and Sexual Activity   . Alcohol use: No    Comment: quit 2012  . Drug use: No    Comment: quit in 2012  . Sexual activity: Yes    Birth control/protection: None  Other Topics Concern  . Not on file  Social History Narrative  . Not on file   Social Determinants of Health   Financial Resource Strain: Not on file  Food Insecurity: Not on file  Transportation Needs: Not on file  Physical Activity: Not on file  Stress: Not on file  Social Connections: Not on file    Family History  Problem Relation Age of Onset  . Diabetes Mother   . Glaucoma Mother     Outpatient Encounter Medications as of 11/21/2020  Medication Sig  . ALPRAZolam (XANAX) 1 MG tablet Take 0.5-1 tablets (0.5-1 mg total) by mouth 3 (three) times daily as needed for anxiety (take ONLY IF NEEDED).  Marland Kitchen amLODipine (NORVASC) 5 MG tablet Take 1 tablet (5 mg total) by mouth daily.  . Continuous Blood Gluc Receiver (FREESTYLE LIBRE READER) DEVI Use to check glucose at  least 4 times daily. Dx E11.40  . Continuous Blood Gluc Sensor (FREESTYLE LIBRE 14 DAY SENSOR) MISC Scan to check glucose at least 4 times daily. DX: E11.4  . famotidine (PEPCID) 20 MG tablet TAKE 1 TABLET BY MOUTH TWICE A DAY  . insulin degludec (TRESIBA FLEXTOUCH) 200 UNIT/ML FlexTouch Pen Inject 90 Units into the skin in the morning. (Patient taking differently: Inject 80 Units into the skin in the morning.)  . insulin lispro (HUMALOG) 100 UNIT/ML KwikPen Inject 15 Units into the skin 3 (three) times daily.  . Insulin Pen Needle (B-D ULTRAFINE III SHORT PEN) 31G X 8 MM MISC Use to give insulin daily Dx E11.9  . lisinopril (ZESTRIL) 40 MG tablet Take 1 tablet (40 mg total) by mouth daily.  . mirtazapine (REMERON) 45 MG tablet TAKE 1 TABLET (45 MG TOTAL) BY MOUTH AT BEDTIME. PLEASE INFORM OF CHANGE IN PILL  . rosuvastatin (CRESTOR) 20 MG tablet TAKE 1 TABLET BY MOUTH EVERYDAY AT BEDTIME  . sildenafil (VIAGRA) 50 MG tablet Take 1 tablet (50 mg total) by mouth daily as needed for  erectile dysfunction.  . [DISCONTINUED] empagliflozin (JARDIANCE) 25 MG TABS tablet Take 25 mg by mouth daily before breakfast.  . [DISCONTINUED] metFORMIN (GLUCOPHAGE-XR) 500 MG 24 hr tablet TAKE 2 TABLETS BY MOUTH TWICE A DAY WITH FOOD  . ACCU-CHEK AVIVA PLUS test strip USE TO MONITOR BLOOD GLUCOSE 3 TIME(S) DAILY. DX E11.9 (Patient not taking: Reported on 11/21/2020)  . Accu-Chek Softclix Lancets lancets Check BS TID Dx E11.9 (Patient not taking: Reported on 11/21/2020)  . metFORMIN (GLUCOPHAGE-XR) 500 MG 24 hr tablet Take 2 tablets (1,000 mg total) by mouth daily with breakfast.  . [DISCONTINUED] acetaZOLAMIDE (DIAMOX) 500 MG capsule Take 1 capsule by mouth 2 (two) times a day.  . [DISCONTINUED] cefdinir (OMNICEF) 300 MG capsule Take 1 capsule (300 mg total) by mouth 2 (two) times daily. 1 po BID  . [DISCONTINUED] ergocalciferol (VITAMIN D2) 1.25 MG (50000 UT) capsule Take 50,000 Units by mouth once a week. (Patient not taking: Reported on 10/30/2020)  . [DISCONTINUED] lactulose (CHRONULAC) 10 GM/15ML solution TAKE 30 MLS (20 G TOTAL) BY MOUTH DAILY AS NEEDED FOR UP TO 8 DOSES FOR MILD CONSTIPATION.   No facility-administered encounter medications on file as of 11/21/2020.    ALLERGIES: No Known Allergies  VACCINATION STATUS: Immunization History  Administered Date(s) Administered  . Fluad Quad(high Dose 65+) 04/30/2017, 04/13/2019  . Influenza Split 04/19/2014, 05/29/2020  . Influenza-Unspecified 05/24/2020  . Moderna Sars-Covid-2 Vaccination 09/30/2019, 10/29/2019, 06/24/2020  . Pneumococcal Conjugate-13 04/23/2018  . Pneumococcal Polysaccharide-23 12/28/2015  . Zoster 01/04/2016    Diabetes He presents for his initial diabetic visit. He has type 2 diabetes mellitus. Onset time: diagnosed at approx age of 61. His disease course has been worsening. There are no hypoglycemic associated symptoms. Associated symptoms include blurred vision, fatigue, foot paresthesias, polydipsia, polyuria  and visual change. Pertinent negatives for diabetes include no weakness and no weight loss. There are no hypoglycemic complications. Symptoms are stable. Diabetic complications include heart disease, impotence, nephropathy and retinopathy. Risk factors for coronary artery disease include diabetes mellitus, dyslipidemia, family history, hypertension, male sex, obesity, sedentary lifestyle and tobacco exposure. Current diabetic treatment includes intensive insulin program and oral agent (dual therapy). He is compliant with treatment most of the time. His weight is increasing steadily. He is following a generally unhealthy diet. When asked about meal planning, he reported none. He has not had a previous visit with a dietitian. He  rarely participates in exercise. (He presents today for his consultation with no meter or logs to review.  He does have Freestyle Libre CGM and monitors his glucose at least 4 times daily, but did not bring it with him.  His most recent A1c was 8.9% on 3/28.  He admits to drinking soda (recently cutting back and switching to diet soda) and eats 3 meals plus occasional snacks.  He does not engage in routine physical activity.  He reports he has recently seen significant improvement in his glucose readings, this morning it was 88 and 95 before lunch today.  He denies any significant hypoglycemia.) An ACE inhibitor/angiotensin II receptor blocker is being taken. He does not see a podiatrist.Eye exam is current.  Hyperlipidemia This is a chronic problem. The current episode started more than 1 year ago. The problem is controlled. Recent lipid tests were reviewed and are normal. Exacerbating diseases include chronic renal disease, diabetes and obesity. Factors aggravating his hyperlipidemia include fatty foods. Current antihyperlipidemic treatment includes statins. The current treatment provides moderate improvement of lipids. Compliance problems include adherence to diet and adherence to  exercise.  Risk factors for coronary artery disease include diabetes mellitus, dyslipidemia, family history, male sex, hypertension, obesity and a sedentary lifestyle.  Hypertension This is a chronic problem. The current episode started more than 1 year ago. The problem has been gradually improving since onset. The problem is controlled. Associated symptoms include blurred vision. There are no associated agents to hypertension. Risk factors for coronary artery disease include diabetes mellitus, dyslipidemia, family history, male gender, obesity, sedentary lifestyle and smoking/tobacco exposure. Past treatments include ACE inhibitors and calcium channel blockers. The current treatment provides moderate improvement. There are no compliance problems.  Hypertensive end-organ damage includes kidney disease, CAD/MI and retinopathy. Identifiable causes of hypertension include chronic renal disease.     Review of systems  Constitutional: + Minimally fluctuating body weight,  current Body mass index is 40.24 kg/m. , no fatigue, no subjective hyperthermia, no subjective hypothermia Eyes: + blurry vision (bilateral retinopathy with recent surgery on both eyes), legally blind in right eye, no xerophthalmia ENT: no sore throat, no nodules palpated in throat, no dysphagia/odynophagia, no hoarseness Cardiovascular: no chest pain, no shortness of breath, no palpitations, no leg swelling Respiratory: no cough, no shortness of breath Gastrointestinal: no nausea/vomiting/diarrhea Musculoskeletal: no muscle/joint aches, walks with cane for deconditioning Skin: no rashes, no hyperemia Neurological: no tremors, no numbness, no tingling, no dizziness Psychiatric: no depression, no anxiety  Objective:     BP 140/69 (BP Location: Left Arm, Patient Position: Sitting)   Pulse 69   Ht 6\' 1"  (1.854 m)   Wt (!) 305 lb (138.3 kg)   BMI 40.24 kg/m   Wt Readings from Last 3 Encounters:  11/21/20 (!) 305 lb (138.3 kg)   10/30/20 (!) 302 lb (137 kg)  08/01/20 296 lb (134.3 kg)     BP Readings from Last 3 Encounters:  11/21/20 140/69  10/30/20 117/67  08/01/20 (!) 147/77     Physical Exam- Limited  Constitutional:  Body mass index is 40.24 kg/m. , not in acute distress, normal state of mind Eyes:  EOMI, no exophthalmos, right eye opaque conjunctiva Neck: Supple Cardiovascular: RRR, no murmers, rubs, or gallops, no edema Respiratory: Adequate breathing efforts, no crackles, rales, rhonchi, or wheezing Musculoskeletal: no gross deformities, strength intact in all four extremities, no gross restriction of joint movements, walks with cane for deconditioning Skin:  no rashes, no hyperemia Neurological: no tremor  with outstretched hands    CMP ( most recent) CMP     Component Value Date/Time   NA 142 10/30/2020 0756   K 4.8 10/30/2020 0756   CL 106 10/30/2020 0756   CO2 20 10/30/2020 0756   GLUCOSE 196 (H) 10/30/2020 0756   GLUCOSE 149 (H) 02/09/2019 1340   BUN 17 10/30/2020 0756   CREATININE 1.18 10/30/2020 0756   CALCIUM 10.1 10/30/2020 0756   PROT 7.7 10/30/2020 0756   ALBUMIN 4.1 10/30/2020 0756   AST 26 10/30/2020 0756   ALT 20 10/30/2020 0756   ALKPHOS 102 10/30/2020 0756   BILITOT 0.3 10/30/2020 0756   GFRNONAA 79 04/13/2020 0906   GFRAA 92 04/13/2020 0906     Diabetic Labs (most recent): Lab Results  Component Value Date   HGBA1C 8.9 (H) 10/30/2020   HGBA1C 8.5 (H) 08/01/2020   HGBA1C 7.5 (H) 04/13/2020     Lipid Panel ( most recent) Lipid Panel     Component Value Date/Time   CHOL 116 04/13/2020 0906   TRIG 111 04/13/2020 0906   HDL 43 04/13/2020 0906   CHOLHDL 2.7 04/13/2020 0906   LDLCALC 53 04/13/2020 0906   LABVLDL 20 04/13/2020 0906      Lab Results  Component Value Date   TSH 0.280 (L) 07/05/2019   TSH 0.124 (L) 04/28/2019   TSH 0.059 (L) 04/06/2019           Assessment & Plan:   1) Uncontrolled Type 2 Diabetes with long-term use of  insulin  - [name] has currently uncontrolled symptomatic type 2 DM since 70 years of age, with most recent A1c of 8.9 %.   He presents today for his consultation with no meter or logs to review.  He does have Freestyle Libre CGM and monitors his glucose at least 4 times daily, but did not bring it with him.  His most recent A1c was 8.9% on 3/28.  He admits to drinking soda (recently cutting back and switching to diet soda) and eats 3 meals plus occasional snacks.  He does not engage in routine physical activity.  He reports he has recently seen significant improvement in his glucose readings, this morning it was 88 and 95 before lunch today.  He denies any significant hypoglycemia.  -Recent labs reviewed.  - I had a long discussion with him about the progressive nature of diabetes and the pathology behind its complications. -his diabetes is complicated by CKD, CAD and he remains at a high risk for more acute and chronic complications which include CAD, CVA, CKD, retinopathy, and neuropathy. These are all discussed in detail with him.  - I have counseled him on diet  and weight management by adopting a carbohydrate restricted/protein rich diet. Patient is encouraged to switch to unprocessed or minimally processed complex starch and increased protein intake (animal or plant source), fruits, and vegetables. -  he is advised to stick to a routine mealtimes to eat 3 meals a day and avoid unnecessary snacks (to snack only to correct hypoglycemia).   - he acknowledges that there is a room for improvement in his food and drink choices. - Suggestion is made for him to avoid simple carbohydrates  from his diet including Cakes, Sweet Desserts, Ice Cream, Soda (diet and regular), Sweet Tea, Candies, Chips, Cookies, Store Bought Juices, Alcohol in Excess of  1-2 drinks a day, Artificial Sweeteners, Coffee Creamer, and "Sugar-free" Products. This will help patient to have more stable blood glucose profile  and  potentially avoid unintended weight gain.  - I have approached him with the following individualized plan to manage  his diabetes and patient agrees:   -He can continue his Tresiba at 80 units SQ nightly.  Will change his Humalog to 10-16 units TID with meals if glucose is above 90 and he is eating.  Specific instructions on how to titrate insulin dose based on glucose readings given to patient in writing.  Patient demonstrated his ability to use SSI chart properly with me today.  -he is encouraged to continue using his CGM to monitor glucose 4 times daily, before meals and before bed, to log their readings on the clinic sheets provided, and bring them to review at follow up appointment in 2 weeks.  - he is warned not to take insulin without proper monitoring per orders. - Adjustment parameters are given to him for hypo and hyperglycemia in writing.  - he is encouraged to call clinic for blood glucose levels less than 70 or above 300 mg /dl.  - he is advised to lower his Metformin 1000 mg ER daily with breakfast, therapeutically suitable for patient . - his Vania Rea will be discontinued, risk outweighs benefit for this patient.  - he will be considered for incretin therapy as appropriate next visit.  - Specific targets for  A1c;  LDL, HDL,  and Triglycerides were discussed with the patient.  2) Blood Pressure /Hypertension:  his blood pressure is controlled to target.   he is advised to continue his current medications including Norvasc 5 mg po daily and Lisinopril 40 mg p.o. daily with breakfast.  3) Lipids/Hyperlipidemia:    Review of his recent lipid panel from 04/13/20 showed controlled  LDL at 53 .  he  is advised to continue Crestor 20 mg daily at bedtime.  Side effects and precautions discussed with him.  4)  Weight/Diet:  his Body mass index is 40.24 kg/m.  -  clearly complicating his diabetes care.   he is a candidate for weight loss. I discussed with him the fact that loss of 5 -  10% of his  current body weight will have the most impact on his diabetes management.  Exercise, and detailed carbohydrates information provided  -  detailed on discharge instructions.  5) Chronic Care/Health Maintenance: -he is on ACEI/ARB and Statin medications and is encouraged to initiate and continue to follow up with Ophthalmology, Dentist, Podiatrist at least yearly or according to recommendations, and advised to stay away from smoking. I have recommended yearly flu vaccine and pneumonia vaccine at least every 5 years; moderate intensity exercise for up to 150 minutes weekly; and sleep for at least 7 hours a day.  - he is advised to maintain close follow up with Charles Norlander, DO for primary care needs, as well as his other providers for optimal and coordinated care.   - Time spent in this patient care: 60 min, of which > 50% was spent in counseling him about his diabetes and the rest reviewing his blood glucose logs, discussing his hypoglycemia and hyperglycemia episodes, reviewing his current and previous labs/studies (including abstraction from other facilities) and medications doses and developing a long term treatment plan based on the latest standards of care/guidelines; and documenting his care.    Please refer to Patient Instructions for Blood Glucose Monitoring and Insulin/Medications Dosing Guide" in media tab for additional information. Please also refer to "Patient Self Inventory" in the Media tab for reviewed elements of pertinent patient  history.  [name] participated in the discussions, expressed understanding, and voiced agreement with the above plans.  All questions were answered to his satisfaction. he is encouraged to contact clinic should he have any questions or concerns prior to his return visit.   Follow up plan: - Return in about 2 weeks (around 12/05/2020) for Diabetes follow up, No previsit labs, Bring glucometer and logs, ABI next visit.  Rayetta Pigg,  [facility] [address] [place], [place] [address] Phone: [phone] Fax: [phone]  11/21/2020, 3:00 PM

## 2020-12-05 ENCOUNTER — Encounter: Payer: Self-pay | Admitting: Nurse Practitioner

## 2020-12-05 ENCOUNTER — Other Ambulatory Visit: Payer: Self-pay

## 2020-12-05 ENCOUNTER — Ambulatory Visit: Payer: Medicare Other | Admitting: Nurse Practitioner

## 2020-12-05 VITALS — BP 145/84 | HR 89 | Ht 73.0 in | Wt 308.0 lb

## 2020-12-05 DIAGNOSIS — Z794 Long term (current) use of insulin: Secondary | ICD-10-CM | POA: Diagnosis not present

## 2020-12-05 DIAGNOSIS — H2513 Age-related nuclear cataract, bilateral: Secondary | ICD-10-CM | POA: Diagnosis not present

## 2020-12-05 DIAGNOSIS — E1122 Type 2 diabetes mellitus with diabetic chronic kidney disease: Secondary | ICD-10-CM | POA: Diagnosis not present

## 2020-12-05 DIAGNOSIS — I10 Essential (primary) hypertension: Secondary | ICD-10-CM | POA: Diagnosis not present

## 2020-12-05 DIAGNOSIS — N182 Chronic kidney disease, stage 2 (mild): Secondary | ICD-10-CM

## 2020-12-05 DIAGNOSIS — H40033 Anatomical narrow angle, bilateral: Secondary | ICD-10-CM | POA: Diagnosis not present

## 2020-12-05 DIAGNOSIS — E782 Mixed hyperlipidemia: Secondary | ICD-10-CM

## 2020-12-05 LAB — POCT UA - MICROALBUMIN
Creatinine, POC: 200 mg/dL
Microalbumin Ur, POC: 150 mg/L

## 2020-12-05 NOTE — Addendum Note (Signed)
Addended by: Lavell Luster A on: 12/05/2020 10:26 AM   Modules accepted: Orders

## 2020-12-05 NOTE — Patient Instructions (Signed)

## 2020-12-05 NOTE — Progress Notes (Signed)
Endocrinology Follow Up Note       12/05/2020, 10:00 AM   Subjective:    Patient ID: [name], male    DOB: Oct 29, 1950.  [name] is being seen in follow up after being seen in consultation for management of currently uncontrolled symptomatic diabetes requested by  [name], DO.   Past Medical History:  Diagnosis Date  . Anxiety   . Arthritis   . Diabetes mellitus without complication ([place])   . GERD (gastroesophageal reflux disease)   . Hypercholesteremia   . Hypertension     Past Surgical History:  Procedure Laterality Date  . boils     removed form back of skull  . CATARACT EXTRACTION W/PHACO Right 04/02/2013   Procedure: CATARACT EXTRACTION PHACO AND INTRAOCULAR LENS PLACEMENT ([place]);  Surgeon: Williams Che, MD;  Location: [facility];  Service: Ophthalmology;  Laterality: Right;  CDE 7.00  . CATARACT EXTRACTION W/PHACO Left 10/04/2013   Procedure: CATARACT EXTRACTION PHACO AND INTRAOCULAR LENS PLACEMENT ([facility]);  Surgeon: Williams Che, MD;  Location: [facility];  Service: Ophthalmology;  Laterality: Left;  CDE:1.46  . COLONOSCOPY N/A 07/03/2015   Procedure: COLONOSCOPY;  Surgeon: Rogene Houston, MD;  Location: [facility];  Service: Endoscopy;  Laterality: N/A;  830  . EYE SURGERY Right    "valve replaced and stent placed" per pt to reroute the fluid in his eye  . ROTATOR CUFF REPAIR Right     Social History   Socioeconomic History  . Marital status: Divorced    Spouse name: Not on file  . Number of children: Not on file  . Years of education: Not on file  . Highest education level: Not on file  Occupational History  . Not on file  Tobacco Use  . Smoking status: Former Smoker    Packs/day: 0.50    Years: 41.00    Pack years: 20.50    Types: Cigarettes    Quit date: 02/29/2012    Years since quitting: 8.7  . Smokeless tobacco: Never Used  Vaping Use  . Vaping Use: Never used   Substance and Sexual Activity  . Alcohol use: No    Comment: quit 2012  . Drug use: No    Comment: quit in 2012  . Sexual activity: Yes    Birth control/protection: None  Other Topics Concern  . Not on file  Social History Narrative  . Not on file   Social Determinants of Health   Financial Resource Strain: Not on file  Food Insecurity: Not on file  Transportation Needs: Not on file  Physical Activity: Not on file  Stress: Not on file  Social Connections: Not on file    Family History  Problem Relation Age of Onset  . Diabetes Mother   . Glaucoma Mother     Outpatient Encounter Medications as of 12/05/2020  Medication Sig  . ACCU-CHEK AVIVA PLUS test strip USE TO MONITOR BLOOD GLUCOSE 3 TIME(S) DAILY. DX E11.9 (Patient not taking: Reported on 11/21/2020)  . Accu-Chek Softclix Lancets lancets Check BS TID Dx E11.9 (Patient not taking: Reported on 11/21/2020)  . ALPRAZolam (XANAX) 1 MG tablet Take 0.5-1 tablets (0.5-1  mg total) by mouth 3 (three) times daily as needed for anxiety (take ONLY IF NEEDED).  Marland Kitchen amLODipine (NORVASC) 5 MG tablet Take 1 tablet (5 mg total) by mouth daily.  . Continuous Blood Gluc Receiver (FREESTYLE LIBRE READER) DEVI Use to check glucose at least 4 times daily. Dx E11.40  . Continuous Blood Gluc Sensor (FREESTYLE LIBRE 14 DAY SENSOR) MISC Scan to check glucose at least 4 times daily. DX: E11.4  . famotidine (PEPCID) 20 MG tablet TAKE 1 TABLET BY MOUTH TWICE A DAY  . insulin degludec (TRESIBA FLEXTOUCH) 200 UNIT/ML FlexTouch Pen Inject 90 Units into the skin in the morning. (Patient taking differently: Inject 80 Units into the skin in the morning.)  . insulin lispro (HUMALOG) 100 UNIT/ML KwikPen Inject 15 Units into the skin 3 (three) times daily.  . Insulin Pen Needle (B-D ULTRAFINE III SHORT PEN) 31G X 8 MM MISC Use to give insulin daily Dx E11.9  . lisinopril (ZESTRIL) 40 MG tablet Take 1 tablet (40 mg total) by mouth daily.  . metFORMIN  (GLUCOPHAGE-XR) 500 MG 24 hr tablet Take 2 tablets (1,000 mg total) by mouth daily with breakfast.  . mirtazapine (REMERON) 45 MG tablet TAKE 1 TABLET (45 MG TOTAL) BY MOUTH AT BEDTIME. PLEASE INFORM OF CHANGE IN PILL  . rosuvastatin (CRESTOR) 20 MG tablet TAKE 1 TABLET BY MOUTH EVERYDAY AT BEDTIME  . sildenafil (VIAGRA) 50 MG tablet Take 1 tablet (50 mg total) by mouth daily as needed for erectile dysfunction.   No facility-administered encounter medications on file as of 12/05/2020.    ALLERGIES: No Known Allergies  VACCINATION STATUS: Immunization History  Administered Date(s) Administered  . Fluad Quad(high Dose 65+) 04/30/2017, 04/13/2019  . Influenza Split 04/19/2014, 05/29/2020  . Influenza-Unspecified 05/24/2020  . Moderna Sars-Covid-2 Vaccination 09/30/2019, 10/29/2019, 06/24/2020  . Pneumococcal Conjugate-13 04/23/2018  . Pneumococcal Polysaccharide-23 12/28/2015  . Zoster 01/04/2016    Diabetes He presents for his follow-up diabetic visit. He has type 2 diabetes mellitus. Onset time: diagnosed at approx age of 43. His disease course has been improving. There are no hypoglycemic associated symptoms. Associated symptoms include blurred vision, fatigue, foot paresthesias, polydipsia, polyuria and visual change. Pertinent negatives for diabetes include no weakness and no weight loss. There are no hypoglycemic complications. Symptoms are improving. Diabetic complications include heart disease, impotence, nephropathy and retinopathy. Risk factors for coronary artery disease include diabetes mellitus, dyslipidemia, family history, hypertension, male sex, obesity, sedentary lifestyle and tobacco exposure. Current diabetic treatment includes intensive insulin program and oral agent (monotherapy). He is compliant with treatment most of the time. His weight is fluctuating minimally. He is following a generally unhealthy diet. When asked about meal planning, he reported none. He has not had a  previous visit with a dietitian. He rarely participates in exercise. His home blood glucose trend is decreasing steadily. His breakfast blood glucose range is generally 130-140 mg/dl. His lunch blood glucose range is generally 140-180 mg/dl. His dinner blood glucose range is generally 110-130 mg/dl. His bedtime blood glucose range is generally 140-180 mg/dl. His overall blood glucose range is 130-140 mg/dl. (He presents today with his CGM showing improved, near target fasting and postprandial glycemic profile.  He did have once hypoglycemic episode in the afternoon hours, likely due to meal timing.  He has cut out his sodas.  Analysis of his CGM shows TIR 87%, TAR 11%, TBR 2%.) An ACE inhibitor/angiotensin II receptor blocker is being taken. He does not see a podiatrist.Eye exam is current.  Hyperlipidemia This is a chronic problem. The current episode started more than 1 year ago. The problem is controlled. Recent lipid tests were reviewed and are normal. Exacerbating diseases include chronic renal disease, diabetes and obesity. Factors aggravating his hyperlipidemia include fatty foods. Current antihyperlipidemic treatment includes statins. The current treatment provides moderate improvement of lipids. Compliance problems include adherence to diet and adherence to exercise.  Risk factors for coronary artery disease include diabetes mellitus, dyslipidemia, family history, male sex, hypertension, obesity and a sedentary lifestyle.  Hypertension This is a chronic problem. The current episode started more than 1 year ago. The problem has been gradually improving since onset. The problem is controlled. Associated symptoms include blurred vision. There are no associated agents to hypertension. Risk factors for coronary artery disease include diabetes mellitus, dyslipidemia, family history, male gender, obesity, sedentary lifestyle and smoking/tobacco exposure. Past treatments include ACE inhibitors and calcium  channel blockers. The current treatment provides moderate improvement. There are no compliance problems.  Hypertensive end-organ damage includes kidney disease, CAD/MI and retinopathy. Identifiable causes of hypertension include chronic renal disease.    Review of systems  Constitutional: + Minimally fluctuating body weight,  current There is no height or weight on file to calculate BMI. , no fatigue, no subjective hyperthermia, no subjective hypothermia Eyes: + blurry vision (bilateral retinopathy with recent surgery on both eyes), legally blind in right eye, no xerophthalmia ENT: no sore throat, no nodules palpated in throat, no dysphagia/odynophagia, no hoarseness Cardiovascular: no chest pain, no shortness of breath, no palpitations, no leg swelling Respiratory: no cough, no shortness of breath Gastrointestinal: no nausea/vomiting/diarrhea Musculoskeletal: no muscle/joint aches, walks with cane for deconditioning Skin: no rashes, no hyperemia Neurological: no tremors, no numbness, no tingling, no dizziness Psychiatric: no depression, no anxiety   Objective:     BP (!) 145/84   Pulse 89   Ht 6\' 1"  (1.854 m)   Wt (!) 308 lb (139.7 kg)   BMI 40.64 kg/m   Wt Readings from Last 3 Encounters:  12/05/20 (!) 308 lb (139.7 kg)  11/21/20 (!) 305 lb (138.3 kg)  10/30/20 (!) 302 lb (137 kg)     BP Readings from Last 3 Encounters:  12/05/20 (!) 145/84  11/21/20 140/69  10/30/20 117/67     Physical Exam- Limited  Constitutional:  There is no height or weight on file to calculate BMI. , not in acute distress, normal state of mind Eyes:  EOMI, no exophthalmos, right eye opaque conjunctiva Neck: Supple Cardiovascular: RRR, no murmers, rubs, or gallops, no edema Respiratory: Adequate breathing efforts, no crackles, rales, rhonchi, or wheezing Musculoskeletal: no gross deformities, strength intact in all four extremities, no gross restriction of joint movements, walks with cane for  deconditioning Skin:  no rashes, no hyperemia Neurological: no tremor with outstretched hands   POCT ABI Results 12/05/20   Right ABI:  1.15      Left ABI:  1.16  Right leg systolic / diastolic: 0000000 mmHg Left leg systolic / diastolic: 123456 mmHg  Arm systolic / diastolic: A999333 mmHG  Detailed report will be scanned into patient chart.   CMP ( most recent) CMP     Component Value Date/Time   NA 142 10/30/2020 0756   K 4.8 10/30/2020 0756   CL 106 10/30/2020 0756   CO2 20 10/30/2020 0756   GLUCOSE 196 (H) 10/30/2020 0756   GLUCOSE 149 (H) 02/09/2019 1340   BUN 17 10/30/2020 0756   CREATININE 1.18 10/30/2020 0756   CALCIUM  10.1 10/30/2020 0756   PROT 7.7 10/30/2020 0756   ALBUMIN 4.1 10/30/2020 0756   AST 26 10/30/2020 0756   ALT 20 10/30/2020 0756   ALKPHOS 102 10/30/2020 0756   BILITOT 0.3 10/30/2020 0756   GFRNONAA 79 04/13/2020 0906   GFRAA 92 04/13/2020 0906     Diabetic Labs (most recent): Lab Results  Component Value Date   HGBA1C 8.9 (H) 10/30/2020   HGBA1C 8.5 (H) 08/01/2020   HGBA1C 7.5 (H) 04/13/2020     Lipid Panel ( most recent) Lipid Panel     Component Value Date/Time   CHOL 116 04/13/2020 0906   TRIG 111 04/13/2020 0906   HDL 43 04/13/2020 0906   CHOLHDL 2.7 04/13/2020 0906   LDLCALC 53 04/13/2020 0906   LABVLDL 20 04/13/2020 0906      Lab Results  Component Value Date   TSH 0.280 (L) 07/05/2019   TSH 0.124 (L) 04/28/2019   TSH 0.059 (L) 04/06/2019           Assessment & Plan:   1) Uncontrolled Type 2 Diabetes with long-term use of insulin  - [name] has currently uncontrolled symptomatic type 2 DM since 70 years of age, with most recent A1c of 8.9 %.   He presents today with his CGM showing improved, near target fasting and postprandial glycemic profile.  He did have once hypoglycemic episode in the afternoon hours, likely due to meal timing.  He has cut out his sodas.  Analysis of his CGM shows TIR 87%, TAR 11%,  TBR 2%.  -Recent labs reviewed.  - I had a long discussion with him about the progressive nature of diabetes and the pathology behind its complications. -his diabetes is complicated by CKD, CAD and he remains at a high risk for more acute and chronic complications which include CAD, CVA, CKD, retinopathy, and neuropathy. These are all discussed in detail with him.  - Nutritional counseling repeated at each appointment due to patients tendency to fall back in to old habits.  - The patient admits there is a room for improvement in their diet and drink choices. -  Suggestion is made for the patient to avoid simple carbohydrates from their diet including Cakes, Sweet Desserts / Pastries, Ice Cream, Soda (diet and regular), Sweet Tea, Candies, Chips, Cookies, Sweet Pastries,  Store Bought Juices, Alcohol in Excess of  1-2 drinks a day, Artificial Sweeteners, Coffee Creamer, and "Sugar-free" Products. This will help patient to have stable blood glucose profile and potentially avoid unintended weight gain.   - I encouraged the patient to switch to  unprocessed or minimally processed complex starch and increased protein intake (animal or plant source), fruits, and vegetables.   - Patient is advised to stick to a routine mealtimes to eat 3 meals  a day and avoid unnecessary snacks ( to snack only to correct hypoglycemia).  - I have approached him with the following individualized plan to manage  his diabetes and patient agrees:   -Given his improved and at target glycemic profile, he can continue his Tresiba at 80 units SQ nightly and his Humalog 10-16 units TID with meals if glucose is above 90 and he is eating.  Specific instructions on how to titrate insulin dose based on glucose readings given to patient in writing.    -he is encouraged to continue using his CGM to monitor glucose 4 times daily, before meals and before bed, and to call the clinic if he has readings less than  70 or greater than 300 for  3 tests in a row.  - he is warned not to take insulin without proper monitoring per orders. - Adjustment parameters are given to him for hypo and hyperglycemia in writing.  - he is advised to contiue his Metformin 1000 mg ER daily with breakfast, therapeutically suitable for patient .  - his Vania Rea was be discontinued at last visit, risk outweighs benefit for this patient.  - he will be considered for incretin therapy as appropriate next visit.  - Specific targets for  A1c;  LDL, HDL,  and Triglycerides were discussed with the patient.  2) Blood Pressure /Hypertension:  his blood pressure is controlled to target for his age.   he is advised to continue his current medications including Norvasc 5 mg po daily and Lisinopril 40 mg p.o. daily with breakfast.  3) Lipids/Hyperlipidemia:    Review of his recent lipid panel from 04/13/20 showed controlled LDL at 53 .  he  is advised to continue Crestor 20 mg daily at bedtime.  Side effects and precautions discussed with him.  4)  Weight/Diet:  his Body mass index is 40.64 kg/m.  -  clearly complicating his diabetes care.   he is a candidate for weight loss. I discussed with him the fact that loss of 5 - 10% of his  current body weight will have the most impact on his diabetes management.  Exercise, and detailed carbohydrates information provided  -  detailed on discharge instructions.  5) Chronic Care/Health Maintenance: -he is on ACEI/ARB and Statin medications and is encouraged to initiate and continue to follow up with Ophthalmology, Dentist, Podiatrist at least yearly or according to recommendations, and advised to stay away from smoking. I have recommended yearly flu vaccine and pneumonia vaccine at least every 5 years; moderate intensity exercise for up to 150 minutes weekly; and sleep for at least 7 hours a day.  - he is advised to maintain close follow up with [name], DO for primary care needs, as well as his other providers  for optimal and coordinated care.     I spent 33 minutes in the care of the patient today including review of labs from [place], Lipids, Thyroid Function, Hematology (current and previous including abstractions from other facilities); face-to-face time discussing  his blood glucose readings/logs, discussing hypoglycemia and hyperglycemia episodes and symptoms, medications doses, his options of short and long term treatment based on the latest standards of care / guidelines;  discussion about incorporating lifestyle medicine;  and documenting the encounter.    Please refer to Patient Instructions for Blood Glucose Monitoring and Insulin/Medications Dosing Guide"  in media tab for additional information. Please  also refer to " Patient Self Inventory" in the Media  tab for reviewed elements of pertinent patient history.  [name] participated in the discussions, expressed understanding, and voiced agreement with the above plans.  All questions were answered to his satisfaction. he is encouraged to contact clinic should he have any questions or concerns prior to his return visit.   Follow up plan: - Return in about 3 months (around 03/07/2021) for Diabetes F/U with A1c in office, No previsit labs, Bring meter and logs.  Rayetta Pigg, [facility] [address] [place], [place] [address] Phone: [phone] Fax: [phone]  12/05/2020, 10:00 AM

## 2020-12-06 ENCOUNTER — Telehealth: Payer: Self-pay | Admitting: Pharmacist

## 2020-12-06 NOTE — Telephone Encounter (Signed)
Refills requested for NovoNordisk patient assistance (tresiba) Medication can take up to 1 month to ship to PCP office

## 2020-12-29 ENCOUNTER — Telehealth: Payer: Self-pay | Admitting: Family Medicine

## 2020-12-29 ENCOUNTER — Ambulatory Visit (INDEPENDENT_AMBULATORY_CARE_PROVIDER_SITE_OTHER): Payer: Medicare Other | Admitting: Pharmacist

## 2020-12-29 DIAGNOSIS — E113553 Type 2 diabetes mellitus with stable proliferative diabetic retinopathy, bilateral: Secondary | ICD-10-CM | POA: Diagnosis not present

## 2020-12-29 DIAGNOSIS — Z794 Long term (current) use of insulin: Secondary | ICD-10-CM | POA: Diagnosis not present

## 2020-12-29 NOTE — Progress Notes (Signed)
    12/29/2020 Name: [name] MRN: [id number] DOB: November 27, 1950   S:  43 yoM Presents for diabetes evaluation, education, and management Patient was referred and last seen by Primary Care Provider on 12/05/20  Insurance coverage/medication affordability: [employer] medicare  Patient reports adherence with medications.   Current diabetes medications include: TRESIBA, HUMALOG, metformin   Current hypertension medications include: lisinopril, amlodipine Goal 130/80   Current hyperlipidemia medications include: rosuvastatin  Patient denies hypoglycemic events.   O:  Lab Results  Component Value Date   HGBA1C 8.9 (H) 10/30/2020     Lipid Panel     Component Value Date/Time   CHOL 116 04/13/2020 0906   TRIG 111 04/13/2020 0906   HDL 43 04/13/2020 0906   CHOLHDL 2.7 04/13/2020 0906   LDLCALC 53 04/13/2020 0906    Home fasting blood sugars: <180  2 hour post-meal/random blood sugars: 180-250.   A/P:  Diabetes T2DM currently uncontrolled. Patient is able to verbalize appropriate hypoglycemia management plan. Patient is adherent with medication. [facility] states they will no longer be servicing patients with part D coverage  Continue medications per endocrine: -Tresiba at 80 units SQ nightly and his Humalog 10-16 units TID with meals if glucose is above 90 and he is eating.    -Strongly encouraged patient to use meal time and sliding scale insulin as prescribed.  These should help with the post prandial highs.    -Application filled out for NovoNordisk patient assitance  -Extensively discussed pathophysiology of diabetes, recommended lifestyle interventions, dietary effects on blood sugar control  -Counseled on s/sx of and management of hypoglycemia    Written patient instructions provided.  Total time in face to face counseling 20 minutes.     Regina Eck, PharmD, BCPS Clinical Pharmacist, [place]   [facility] Phone [phone]

## 2021-01-11 DIAGNOSIS — H401133 Primary open-angle glaucoma, bilateral, severe stage: Secondary | ICD-10-CM | POA: Diagnosis not present

## 2021-01-11 DIAGNOSIS — E113553 Type 2 diabetes mellitus with stable proliferative diabetic retinopathy, bilateral: Secondary | ICD-10-CM | POA: Diagnosis not present

## 2021-01-11 DIAGNOSIS — Z794 Long term (current) use of insulin: Secondary | ICD-10-CM | POA: Diagnosis not present

## 2021-01-14 ENCOUNTER — Other Ambulatory Visit: Payer: Self-pay | Admitting: Family Medicine

## 2021-01-25 ENCOUNTER — Other Ambulatory Visit: Payer: Self-pay | Admitting: Family Medicine

## 2021-02-12 DIAGNOSIS — E1140 Type 2 diabetes mellitus with diabetic neuropathy, unspecified: Secondary | ICD-10-CM | POA: Diagnosis not present

## 2021-02-12 DIAGNOSIS — Z794 Long term (current) use of insulin: Secondary | ICD-10-CM | POA: Diagnosis not present

## 2021-03-02 ENCOUNTER — Encounter: Payer: Self-pay | Admitting: Family Medicine

## 2021-03-02 ENCOUNTER — Other Ambulatory Visit: Payer: Self-pay

## 2021-03-02 ENCOUNTER — Ambulatory Visit (INDEPENDENT_AMBULATORY_CARE_PROVIDER_SITE_OTHER): Payer: Medicare Other | Admitting: Family Medicine

## 2021-03-02 VITALS — BP 152/81 | HR 81 | Temp 97.2°F | Ht 73.0 in | Wt 296.4 lb

## 2021-03-02 DIAGNOSIS — F411 Generalized anxiety disorder: Secondary | ICD-10-CM

## 2021-03-02 DIAGNOSIS — E1169 Type 2 diabetes mellitus with other specified complication: Secondary | ICD-10-CM | POA: Diagnosis not present

## 2021-03-02 DIAGNOSIS — E785 Hyperlipidemia, unspecified: Secondary | ICD-10-CM

## 2021-03-02 DIAGNOSIS — E1159 Type 2 diabetes mellitus with other circulatory complications: Secondary | ICD-10-CM

## 2021-03-02 DIAGNOSIS — Z794 Long term (current) use of insulin: Secondary | ICD-10-CM

## 2021-03-02 DIAGNOSIS — I152 Hypertension secondary to endocrine disorders: Secondary | ICD-10-CM | POA: Diagnosis not present

## 2021-03-02 DIAGNOSIS — E113553 Type 2 diabetes mellitus with stable proliferative diabetic retinopathy, bilateral: Secondary | ICD-10-CM

## 2021-03-02 LAB — BAYER DCA HB A1C WAIVED: HB A1C (BAYER DCA - WAIVED): 7.1 % — ABNORMAL HIGH (ref <7.0)

## 2021-03-02 MED ORDER — ALPRAZOLAM 1 MG PO TABS: 0.5000 mg | ORAL_TABLET | Freq: Three times a day (TID) | ORAL | 1 refills | Status: DC | PRN

## 2021-03-02 NOTE — Progress Notes (Signed)
Subjective: CC: Diabetes PCP: [name], DO [id number] C [name] is a 70 y.o. male presenting to clinic today for:  1. Type 2 Diabetes with hypertension, hyperlipidemia:  Patient reports that he has been feeling pretty good.  He is very optimistic about what his blood sugars will be today because they have been running much better.  On average running between 115 and 147.  He has had a couple of lows but no more than 3 over the last month.  He is currently injecting 70 units as long as his blood sugars above 150 but if it is below 150 he injects somewhere between 66 and 68 units.  Using 10 units twice daily with meals of the Humalog.  Compliant with metformin, Crestor, lisinopril and Norvasc.  Last eye exam: UTD Last foot exam: UTD Last A1c:  Lab Results  Component Value Date   HGBA1C 7.1 (H) 03/02/2021   Nephropathy screen indicated?: UTD Last flu, zoster and/or pneumovax:  Immunization History  Administered Date(s) Administered   Fluad Quad(high Dose 65+) 04/30/2017, 04/13/2019   Influenza Split 04/19/2014, 05/29/2020   Influenza-Unspecified 05/24/2020   Moderna SARS-COV2 Booster Vaccination 12/13/2020   Moderna Sars-Covid-2 Vaccination 09/30/2019, 10/29/2019, 06/24/2020   Pneumococcal Conjugate-13 04/23/2018   Pneumococcal Polysaccharide-23 12/28/2015   Zoster, Live 01/04/2016    ROS: No chest pain, shortness of breath, dizziness, blurred vision or falls.  2.  Anxiety disorder Continues to use Xanax as needed.  Denies any excessive daytime sedation, falls, respiratory pression, visual or auditory hallucinations.   ROS: Per HPI  No Known Allergies Past Medical History:  Diagnosis Date   Anxiety    Arthritis    Diabetes mellitus without complication (HCC)    GERD (gastroesophageal reflux disease)    Hypercholesteremia    Hypertension     Current Outpatient Medications:    ALPRAZolam (XANAX) 1 MG tablet, Take 0.5-1 tablets (0.5-1 mg total) by mouth 3 (three)  times daily as needed for anxiety (take ONLY IF NEEDED)., Disp: 90 tablet, Rfl: 1   amLODipine (NORVASC) 5 MG tablet, Take 1 tablet (5 mg total) by mouth daily., Disp: 90 tablet, Rfl: 3   Continuous Blood Gluc Receiver (FREESTYLE LIBRE READER) DEVI, Use to check glucose at least 4 times daily. Dx E11.40, Disp: 1 each, Rfl: 1   Continuous Blood Gluc Sensor (FREESTYLE LIBRE 14 DAY SENSOR) MISC, Scan to check glucose at least 4 times daily. DX: E11.4, Disp: 2 each, Rfl: 12   famotidine (PEPCID) 20 MG tablet, TAKE 1 TABLET BY MOUTH TWICE A DAY, Disp: 180 tablet, Rfl: 3   insulin degludec (TRESIBA FLEXTOUCH) 200 UNIT/ML FlexTouch Pen, Inject 90 Units into the skin in the morning. (Patient taking differently: Inject 80 Units into the skin in the morning.), Disp: 18 mL, Rfl: 5   insulin lispro (HUMALOG) 100 UNIT/ML KwikPen, Inject 10-16 Units into the skin 3 (three) times daily., Disp: , Rfl:    Insulin Pen Needle (B-D ULTRAFINE III SHORT PEN) 31G X 8 MM MISC, Use to give insulin daily Dx E11.9, Disp: 100 each, Rfl: 3   lisinopril (ZESTRIL) 40 MG tablet, Take 1 tablet (40 mg total) by mouth daily., Disp: 90 tablet, Rfl: 3   metFORMIN (GLUCOPHAGE-XR) 500 MG 24 hr tablet, TAKE 2 TABLETS BY MOUTH TWICE A DAY WITH FOOD, Disp: 360 tablet, Rfl: 3   mirtazapine (REMERON) 45 MG tablet, TAKE 1 TABLET (45 MG TOTAL) BY MOUTH AT BEDTIME. PLEASE INFORM OF CHANGE IN PILL, Disp: 90 tablet, Rfl:  0   rosuvastatin (CRESTOR) 20 MG tablet, TAKE 1 TABLET BY MOUTH EVERYDAY AT BEDTIME, Disp: 90 tablet, Rfl: 0   sildenafil (VIAGRA) 50 MG tablet, Take 1 tablet (50 mg total) by mouth daily as needed for erectile dysfunction., Disp: 10 tablet, Rfl: 2 Social History   Socioeconomic History   Marital status: Divorced    Spouse name: Not on file   Number of children: Not on file   Years of education: Not on file   Highest education level: Not on file  Occupational History   Not on file  Tobacco Use   Smoking status: Former     Packs/day: 0.50    Years: 41.00    Pack years: 20.50    Types: Cigarettes    Quit date: 02/29/2012    Years since quitting: 9.0   Smokeless tobacco: Never  Vaping Use   Vaping Use: Never used  Substance and Sexual Activity   Alcohol use: No    Comment: quit 2012   Drug use: No    Comment: quit in 2012   Sexual activity: Yes    Birth control/protection: None  Other Topics Concern   Not on file  Social History Narrative   Not on file   Social Determinants of Health   Financial Resource Strain: Not on file  Food Insecurity: Not on file  Transportation Needs: Not on file  Physical Activity: Not on file  Stress: Not on file  Social Connections: Not on file  Intimate Partner Violence: Not on file   Family History  Problem Relation Age of Onset   Diabetes Mother    Glaucoma Mother     Objective: Office vital signs reviewed. BP (!) 152/81   Pulse 81   Temp (!) 97.2 F (36.2 C)   Ht '6\' 1"'$  (1.854 m)   Wt 296 lb 6.4 oz (134.4 kg)   SpO2 95%   BMI 39.11 kg/m   Physical Examination:  General: Awake, alert, obese, No acute distress Cardio: regular rate and rhythm, S1S2 heard, no murmurs appreciated Pulm: clear to auscultation bilaterally, no wheezes, rhonchi or rales; normal work of breathing on room air Psych: Mood stable, speech normal, affect appropriate Depression screen [facility] 2/9 03/02/2021 10/30/2020 08/01/2020  Decreased Interest 0 1 0  Down, Depressed, Hopeless 0 0 1  PHQ - 2 Score 0 1 1  Altered sleeping - 0 1  Tired, decreased energy - 1 0  Change in appetite - 1 0  Feeling bad or failure about yourself  - 1 0  Trouble concentrating - 1 1  Moving slowly or fidgety/restless - 1 0  Suicidal thoughts - 0 0  PHQ-9 Score - 6 3  Difficult doing work/chores - Somewhat difficult -   GAD 7 : Generalized Anxiety Score 03/02/2021 10/30/2020 08/01/2020 01/12/2020  Nervous, Anxious, on Edge 0 '1 2 3  '$ Control/stop worrying 0 '1 2 1  '$ Worry too much - different things 0 '1 2 1   '$ Trouble relaxing 0 0 1 3  Restless 0 0 0 1  Easily annoyed or irritable 0 0 1 1  Afraid - awful might happen 0 0 1 0  Total GAD 7 Score 0 '3 9 10  '$ Anxiety Difficulty Not difficult at all Not difficult at all Somewhat difficult Somewhat difficult    Lab Results  Component Value Date   HGBA1C 7.1 (H) 03/02/2021    Assessment/ Plan: 70 y.o. 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  A1c down to 7.1.  Given age I think this is an acceptable blood sugar for this patient.  No changes made today.  Continue statin  Blood pressure borderline.  No changes made  Anxiety disorder stable.  National narcotic database reviewed and there were no red flags.  Continue to follow-up intervally as needed this prescription  Orders Placed This Encounter  Procedures   Bayer DCA Hb A1c Waived   No orders of the defined types were placed in this encounter.    [name], DO [place] [phone]

## 2021-03-08 ENCOUNTER — Ambulatory Visit (INDEPENDENT_AMBULATORY_CARE_PROVIDER_SITE_OTHER): Payer: Medicare Other | Admitting: Pharmacist

## 2021-03-08 DIAGNOSIS — E113553 Type 2 diabetes mellitus with stable proliferative diabetic retinopathy, bilateral: Secondary | ICD-10-CM

## 2021-03-08 DIAGNOSIS — Z794 Long term (current) use of insulin: Secondary | ICD-10-CM

## 2021-03-08 NOTE — Progress Notes (Signed)
Chronic Care Management Pharmacy Note  03/08/2021 Name:  [name] MRN:  [id number] DOB:  1951-03-05  Summary: DIABETES MANAGEMENT   Recommendations/Changes made from today's visit: Diabetes: controlled; current treatment:TRESIBA 80 units daily, NOVOLOG 16-20 units with meals,metformin;  A1c much improved to 7.1% Consider GLP1/sglt2 at follow up Current glucose readings: fasting glucose: <170, post prandial glucose: 150-200s Uses libre CGM/[facility] Denies hypoglycemic/hyperglycemic symptoms Discussed meal planning options and Plate method for healthy eating Avoid sugary drinks and desserts Incorporate balanced protein, non starchy veggies, 1 serving of carbohydrate with each meal Increase water intake Increase physical activity as able Current exercise: n/a Assessed patient finances. Application submitted for novo nordisk patient assistance program--patient was previously enrolled with the [place] [facility]  Follow Up Plan: Telephone follow up appointment with care management team member scheduled for: 2 weeks  Subjective: [name] is an 70 y.o. year old male who is a primary patient of [name], DO.  The CCM team was consulted for assistance with disease management and care coordination needs.    Engaged with patient by telephone for initial visit in response to provider referral for pharmacy case management and/or care coordination services.   Consent to Services:  The patient was given information about Chronic Care Management services, agreed to services, and gave verbal consent prior to initiation of services.  Please see initial visit note for detailed documentation.   Patient Care Team: [name], DO as PCP - General (Family Medicine) Lavera Guise, [facility] (Pharmacist)    Objective:  Lab Results  Component Value Date   CREATININE 1.18 10/30/2020   CREATININE 0.97 04/13/2020   CREATININE 1.07 01/11/2020    Lab Results   Component Value Date   HGBA1C 7.1 (H) 03/02/2021   Last diabetic Eye exam:  Lab Results  Component Value Date/Time   HMDIABEYEEXA Retinopathy (A) 04/20/2019 12:00 AM    Last diabetic Foot exam: No results found for: HMDIABFOOTEX      Component Value Date/Time   CHOL 116 04/13/2020 0906   TRIG 111 04/13/2020 0906   HDL 43 04/13/2020 0906   CHOLHDL 2.7 04/13/2020 0906   LDLCALC 53 04/13/2020 0906    Hepatic Function Latest Ref Rng & Units 10/30/2020 04/13/2020 01/11/2020  Total Protein 6.0 - 8.5 g/dL 7.7 7.5 7.8  Albumin 3.8 - 4.8 g/dL 4.1 4.1 4.2  AST 0 - 40 IU/L _0 ALT 0 - 44 IU/L _1 Alk Phosphatase 44 - 121 IU/L 102 67 72  Total Bilirubin 0.0 - 1.2 mg/dL 0.3 0.3 0.2    Lab Results  Component Value Date/Time   TSH 0.280 (L) 07/05/2019 09:19 AM   TSH 0.124 (L) 04/28/2019 08:19 AM    CBC Latest Ref Rng & Units 04/13/2020 01/11/2020 10/08/2019  WBC 3.4 - 10.8 x10E3/uL 8.0 6.5 7.9  Hemoglobin 13.0 - 17.7 g/dL 14.0 13.0 13.6  Hematocrit 37.5 - 51.0 % 44.0 40.5 44.6  Platelets 150 - 450 x10E3/uL 259 244 248    Lab Results  Component Value Date/Time   VD25OH 49.1 04/06/2019 10:36 AM    Clinical ASCVD: No  The ASCVD Risk score ([place]., et al., 2013) failed to calculate for the following reasons:   The valid total cholesterol range is 130 to 320 mg/dL    Other: (CHADS2VASc if Afib, PHQ9 if depression, MMRC or CAT for COPD, ACT, DEXA)  Social History   Tobacco Use  Smoking Status  Former   Packs/day: 0.50   Years: 41.00   Pack years: 20.50   Types: Cigarettes   Quit date: 02/29/2012   Years since quitting: 9.0  Smokeless Tobacco Never   BP Readings from Last 3 Encounters:  03/02/21 (!) 152/81  12/05/20 (!) 145/84  11/21/20 140/69   Pulse Readings from Last 3 Encounters:  03/02/21 81  12/05/20 89  11/21/20 69   Wt Readings from Last 3 Encounters:  03/02/21 296 lb 6.4 oz (134.4 kg)  12/05/20 (!) 308 lb (139.7 kg)  11/21/20 (!) 305 lb (138.3 kg)     Assessment: Review of patient past medical history, allergies, medications, health status, including review of consultants reports, laboratory and other test data, was performed as part of comprehensive evaluation and provision of chronic care management services.   SDOH:  (Social Determinants of Health) assessments and interventions performed:    CCM Care Plan  No Known Allergies  Medications Reviewed Today     Reviewed by Lavera Guise, [facility] (Pharmacist) on 03/15/21 at 1109  Med List Status: <None>   Medication Order Taking? Sig Documenting Provider Last Dose Status Informant  ALPRAZolam (XANAX) 1 MG tablet [id number]  Take 0.5-1 tablets (0.5-1 mg total) by mouth 3 (three) times daily as needed for anxiety (take ONLY IF NEEDED). Ronnie Doss M, DO  Active   amLODipine (NORVASC) 5 MG tablet [id number] No Take 1 tablet (5 mg total) by mouth daily. [name], DO Taking Active   Continuous Blood Gluc Receiver (FREESTYLE LIBRE READER) DEVI [id number] No Use to check glucose at least 4 times daily. Dx E11.40 [name], DO Taking Active   Continuous Blood Gluc Sensor (FREESTYLE LIBRE 14 DAY SENSOR) MISC [id number] No Scan to check glucose at least 4 times daily. DX: E11.4 [name], DO Taking Active   famotidine (PEPCID) 20 MG tablet [id number] No TAKE 1 TABLET BY MOUTH TWICE A DAY Gottschalk, Ashly M, DO Taking Active   insulin degludec (TRESIBA FLEXTOUCH) 200 UNIT/ML FlexTouch Pen [id number] No Inject 90 Units into the skin in the morning.  Patient taking differently: Inject 80 Units into the skin in the morning.   [name], DO Taking Active            Med Note [facility] [place]   Tue Dec 05, 2020 10:05 AM)    insulin lispro (HUMALOG) 100 UNIT/ML KwikPen [id number] No Inject 10-16 Units into the skin 3 (three) times daily. [provider] Taking Active   Insulin Pen Needle (B-D ULTRAFINE III SHORT PEN) 31G X 8 MM MISC [id number] No Use to  give insulin daily Dx E11.9 Baruch Gouty, FNP Taking Active   lisinopril (ZESTRIL) 40 MG tablet [id number] No Take 1 tablet (40 mg total) by mouth daily. Ronnie Doss M, DO Taking Active   metFORMIN (GLUCOPHAGE-XR) 500 MG 24 hr tablet [id number] No TAKE 2 TABLETS BY MOUTH TWICE A DAY WITH FOOD Ronnie Doss M, DO Taking Active   mirtazapine (REMERON) 45 MG tablet [id number] No TAKE 1 TABLET (45 MG TOTAL) BY MOUTH AT BEDTIME. PLEASE INFORM OF CHANGE IN PILL Ronnie Doss M, DO Taking Active   rosuvastatin (CRESTOR) 20 MG tablet [id number] No TAKE 1 TABLET BY MOUTH EVERYDAY AT BEDTIME Ronnie Doss M, DO Taking Active   sildenafil (VIAGRA) 50 MG tablet [id number] No Take 1 tablet (50 mg total) by mouth daily as needed for erectile dysfunction. [name], DO Taking Active   Med List Note Constance Haw,  Rosendo Gros, CPhT 03/31/13 1303): Lisinopril (strength)             Patient Active Problem List   Diagnosis Date Noted   Proliferative diabetic retinopathy of left eye with macular edema associated with type 2 diabetes mellitus ([place]) 11/09/2019   Diabetic macular edema of right eye with proliferative retinopathy associated with type 2 diabetes mellitus ([place]) 11/09/2019   Optic atrophy associated with retinal dystrophies 11/09/2019   Primary open angle glaucoma of both eyes, severe stage 11/09/2019   Low TSH level 07/07/2019   Diabetic retinopathy of both eyes ([place]) 07/07/2019   Constipation due to pain medication 04/06/2019   Morbid obesity ([place]) 04/06/2019   Controlled substance agreement signed 04/06/2019   Corneal edema 12/16/2018   Type 2 diabetes mellitus with stable proliferative retinopathy of both eyes, with long-term current use of insulin ([place]) 10/12/2018   Primary open angle glaucoma (POAG) of right eye, severe stage 09/15/2017   Coronary artery disease due to lipid rich plaque 10/28/2016   Gastroesophageal reflux disease without esophagitis 01/30/2016   Chronic  bilateral low back pain without sciatica 01/03/2015   Hypertension associated with diabetes ([place]) 01/03/2015   Primary osteoarthritis involving multiple joints 01/03/2015   Vitamin D deficiency 01/03/2015   GAD (generalized anxiety disorder) 01/03/2015   Erectile dysfunction associated with type 2 diabetes mellitus ([place]) 12/21/2013    Immunization History  Administered Date(s) Administered   Fluad Quad(high Dose 65+) 04/30/2017, 04/13/2019   Influenza Split 04/19/2014, 05/29/2020   Influenza-Unspecified 05/24/2020   Moderna SARS-COV2 Booster Vaccination 12/13/2020   Moderna Sars-Covid-2 Vaccination 09/30/2019, 10/29/2019, 06/24/2020   Pneumococcal Conjugate-13 04/23/2018   Pneumococcal Polysaccharide-23 12/28/2015   Zoster, Live 01/04/2016    Conditions to be addressed/monitored: DMII  Care Plan : PHARMD MEDICATION MANAGEMENT  Updates made by Lavera Guise, RPH since 03/15/2021 12:00 AM     Problem: DISEASE PROGRESSION PREVENTION      Long-Range Goal: T2DM   This Visit's Progress: On track  Priority: High  Note:   Current Barriers:  Unable to independently afford treatment regimen Unable to maintain control of T2DM  Pharmacist Clinical Goal(s):  Over the next 90 days, patient will verbalize ability to afford treatment regimen maintain control of T2DM as evidenced by [place]  through collaboration with PharmD and provider.    Interventions: 1:1 collaboration with [name], DO regarding development and update of comprehensive plan of care as evidenced by provider attestation and co-signature Inter-disciplinary care team collaboration (see longitudinal plan of care) Comprehensive medication review performed; medication list updated in electronic medical record  Diabetes: controlled; current treatment:TRESIBA 80 units daily, NOVOLOG 16-20 units with meals,metformin;  A1c much improved to 7.1% Consider GLP1/sglt2 at follow up Current  glucose readings: fasting glucose: <170, post prandial glucose: 150-200s Uses libre CGM/[facility] Denies hypoglycemic/hyperglycemic symptoms Discussed meal planning options and Plate method for healthy eating Avoid sugary drinks and desserts Incorporate balanced protein, non starchy veggies, 1 serving of carbohydrate with each meal Increase water intake Increase physical activity as able Current exercise: n/a Assessed patient finances. Application submitted for novo nordisk patient assistance program--patient was previously enrolled with the [place]   Patient Goals/Self-Care Activities Over the next 90 days, patient will:  - take medications as prescribed check glucose 3 times daily or if symptomatic, document, and provide at future appointments  Follow Up Plan: Telephone follow up appointment with care management team member scheduled for: 2 weeks      Medication  Assistance: Application for NOVO [place]  medication assistance program. in process.  Anticipated assistance start date TBD.  See plan of care for additional detail.  Patient's preferred pharmacy is:  [id number] [place]Phone: [phone]: [phone] [place]- [address]Phone: [phone]: [phone] [address]Phone: [phone]: [phone]  Follow Up:  Patient agrees to Care Plan and Follow-up.  Plan: Telephone follow up appointment with care management team member scheduled for:  2 WEEKS  JRegina Eck PharmD, BCPS Clinical Pharmacist, [place] Phone [phone]

## 2021-03-13 DIAGNOSIS — L84 Corns and callosities: Secondary | ICD-10-CM | POA: Diagnosis not present

## 2021-03-13 DIAGNOSIS — E1142 Type 2 diabetes mellitus with diabetic polyneuropathy: Secondary | ICD-10-CM | POA: Diagnosis not present

## 2021-03-13 DIAGNOSIS — M79676 Pain in unspecified toe(s): Secondary | ICD-10-CM | POA: Diagnosis not present

## 2021-03-13 DIAGNOSIS — B351 Tinea unguium: Secondary | ICD-10-CM | POA: Diagnosis not present

## 2021-03-15 NOTE — Patient Instructions (Signed)
Visit Information  PATIENT GOALS:  Goals Addressed               This Visit's Progress     Patient Stated     T2DM-PHARMD GOAL (pt-stated)        Current Barriers:  Unable to independently afford treatment regimen Unable to maintain control of T2DM  Pharmacist Clinical Goal(s):  Over the next 90 days, patient will verbalize ability to afford treatment regimen maintain control of T2DM as evidenced by [place]  through collaboration with PharmD and provider.    Interventions: 1:1 collaboration with [name], DO regarding development and update of comprehensive plan of care as evidenced by provider attestation and co-signature Inter-disciplinary care team collaboration (see longitudinal plan of care) Comprehensive medication review performed; medication list updated in electronic medical record  Diabetes: controlled; current treatment:TRESIBA 80 units daily, NOVOLOG 16-20 units with meals,metformin;  A1c much improved to 7.1% Consider GLP1/sglt2 at follow up Current glucose readings: fasting glucose: <170, post prandial glucose: 150-200s Uses libre CGM/[facility] Denies hypoglycemic/hyperglycemic symptoms Discussed meal planning options and Plate method for healthy eating Avoid sugary drinks and desserts Incorporate balanced protein, non starchy veggies, 1 serving of carbohydrate with each meal Increase water intake Increase physical activity as able Current exercise: n/a Assessed patient finances. Application submitted for novo nordisk patient assistance program--patient was previously enrolled with the [place]   Patient Goals/Self-Care Activities Over the next 90 days, patient will:  - take medications as prescribed check glucose 3 times daily or if symptomatic, document, and provide at future appointments  Follow Up Plan: Telephone follow up appointment with care management team member scheduled for: 2 weeks          The patient verbalized understanding of instructions, educational materials, and care plan provided today and declined offer to receive copy of patient instructions, educational materials, and care plan.   Telephone follow up appointment with care management team member scheduled for: 2 WEEKS  Signature Regina Eck, PharmD, BCPS Clinical Pharmacist, [place]  [place] Phone [phone]

## 2021-03-15 NOTE — Progress Notes (Signed)
Received notification from [place] regarding approval for [place], [place], & NOVOLOG. Patient assistance approved from 03/15/21 to 08/04/21.  Medication shipment is processing and will deliver to office in about 3 weeks  Phone: [phone]

## 2021-03-27 DIAGNOSIS — H6502 Acute serous otitis media, left ear: Secondary | ICD-10-CM | POA: Diagnosis not present

## 2021-03-27 DIAGNOSIS — R42 Dizziness and giddiness: Secondary | ICD-10-CM | POA: Diagnosis not present

## 2021-03-28 ENCOUNTER — Telehealth: Payer: Medicare Other

## 2021-04-03 ENCOUNTER — Telehealth: Payer: Self-pay | Admitting: Pharmacist

## 2021-04-03 NOTE — Telephone Encounter (Signed)
4 month supply of tresiba and novolog in fridge for patient to pick up (rybelsus incorrectly ordered/sent) He is coming today

## 2021-04-10 ENCOUNTER — Other Ambulatory Visit: Payer: Self-pay

## 2021-04-10 ENCOUNTER — Ambulatory Visit (INDEPENDENT_AMBULATORY_CARE_PROVIDER_SITE_OTHER): Payer: Medicare Other | Admitting: Nurse Practitioner

## 2021-04-10 ENCOUNTER — Encounter: Payer: Self-pay | Admitting: Nurse Practitioner

## 2021-04-10 VITALS — BP 128/70 | HR 83 | Ht 73.0 in | Wt 300.8 lb

## 2021-04-10 DIAGNOSIS — Z794 Long term (current) use of insulin: Secondary | ICD-10-CM | POA: Diagnosis not present

## 2021-04-10 DIAGNOSIS — I10 Essential (primary) hypertension: Secondary | ICD-10-CM | POA: Diagnosis not present

## 2021-04-10 DIAGNOSIS — E782 Mixed hyperlipidemia: Secondary | ICD-10-CM | POA: Diagnosis not present

## 2021-04-10 DIAGNOSIS — N182 Chronic kidney disease, stage 2 (mild): Secondary | ICD-10-CM

## 2021-04-10 DIAGNOSIS — E1122 Type 2 diabetes mellitus with diabetic chronic kidney disease: Secondary | ICD-10-CM | POA: Diagnosis not present

## 2021-04-10 MED ORDER — TRESIBA FLEXTOUCH 200 UNIT/ML ~~LOC~~ SOPN: 60.0000 [IU] | PEN_INJECTOR | Freq: Every morning | SUBCUTANEOUS | 5 refills | Status: DC

## 2021-04-10 MED ORDER — ONETOUCH ULTRASOFT LANCETS MISC: 12 refills | Status: AC

## 2021-04-10 MED ORDER — ONETOUCH VERIO VI STRP: ORAL_STRIP | 12 refills | Status: AC

## 2021-04-10 NOTE — Patient Instructions (Signed)

## 2021-04-10 NOTE — Progress Notes (Signed)
Endocrinology Follow Up Note       04/10/2021, 10:55 AM   Subjective:    Patient ID: [name], male    DOB: 29-Sep-1950.  [name] is being seen in follow up after being seen in consultation for management of currently uncontrolled symptomatic diabetes requested by  [name], DO.   Past Medical History:  Diagnosis Date   Anxiety    Arthritis    Diabetes mellitus without complication (HCC)    GERD (gastroesophageal reflux disease)    Hypercholesteremia    Hypertension     Past Surgical History:  Procedure Laterality Date   boils     removed form back of skull   CATARACT EXTRACTION W/PHACO Right 04/02/2013   Procedure: CATARACT EXTRACTION PHACO AND INTRAOCULAR LENS PLACEMENT ([place]);  Surgeon: Williams Che, MD;  Location: [facility];  Service: Ophthalmology;  Laterality: Right;  CDE 7.00   CATARACT EXTRACTION W/PHACO Left 10/04/2013   Procedure: CATARACT EXTRACTION PHACO AND INTRAOCULAR LENS PLACEMENT ([facility]);  Surgeon: Williams Che, MD;  Location: [facility];  Service: Ophthalmology;  Laterality: Left;  CDE:1.46   COLONOSCOPY N/A 07/03/2015   Procedure: COLONOSCOPY;  Surgeon: Rogene Houston, MD;  Location: [facility];  Service: Endoscopy;  Laterality: N/A;  830   EYE SURGERY Right    "valve replaced and stent placed" per pt to reroute the fluid in his eye   ROTATOR CUFF REPAIR Right     Social History   Socioeconomic History   Marital status: Divorced    Spouse name: Not on file   Number of children: Not on file   Years of education: Not on file   Highest education level: Not on file  Occupational History   Not on file  Tobacco Use   Smoking status: Former    Packs/day: 0.50    Years: 41.00    Pack years: 20.50    Types: Cigarettes    Quit date: 02/29/2012    Years since quitting: 9.1   Smokeless tobacco: Never  Vaping Use   Vaping Use: Never used  Substance and Sexual Activity    Alcohol use: No    Comment: quit 2012   Drug use: No    Comment: quit in 2012   Sexual activity: Yes    Birth control/protection: None  Other Topics Concern   Not on file  Social History Narrative   Not on file   Social Determinants of Health   Financial Resource Strain: Not on file  Food Insecurity: Not on file  Transportation Needs: Not on file  Physical Activity: Not on file  Stress: Not on file  Social Connections: Not on file    Family History  Problem Relation Age of Onset   Diabetes Mother    Glaucoma Mother     Outpatient Encounter Medications as of 04/10/2021  Medication Sig   ALPRAZolam (XANAX) 1 MG tablet Take 0.5-1 tablets (0.5-1 mg total) by mouth 3 (three) times daily as needed for anxiety (take ONLY IF NEEDED).   amLODipine (NORVASC) 5 MG tablet Take 1 tablet (5 mg total) by mouth daily.   Continuous Blood Gluc Receiver (FREESTYLE LIBRE READER) DEVI  Use to check glucose at least 4 times daily. Dx E11.40   Continuous Blood Gluc Sensor (FREESTYLE LIBRE 14 DAY SENSOR) MISC Scan to check glucose at least 4 times daily. DX: E11.4   famotidine (PEPCID) 20 MG tablet TAKE 1 TABLET BY MOUTH TWICE A DAY   glucose blood (ONETOUCH VERIO) test strip Use as instructed to monitor glucose 4 times daily (use as back up method for CGM)   insulin lispro (HUMALOG) 100 UNIT/ML KwikPen Inject 10-16 Units into the skin 3 (three) times daily.   Insulin Pen Needle (B-D ULTRAFINE III SHORT PEN) 31G X 8 MM MISC Use to give insulin daily Dx E11.9   Lancets (ONETOUCH ULTRASOFT) lancets Use as instructed to monitor glucose 4 times daily (use as back up method for CGM).   lisinopril (ZESTRIL) 40 MG tablet Take 1 tablet (40 mg total) by mouth daily.   meclizine (ANTIVERT) 25 MG tablet Take 25 mg by mouth 3 (three) times daily as needed.   metFORMIN (GLUCOPHAGE-XR) 500 MG 24 hr tablet TAKE 2 TABLETS BY MOUTH TWICE A DAY WITH FOOD   mirtazapine (REMERON) 45 MG tablet TAKE 1 TABLET (45 MG  TOTAL) BY MOUTH AT BEDTIME. PLEASE INFORM OF CHANGE IN PILL   omeprazole (PRILOSEC) 20 MG capsule Take by mouth.   rosuvastatin (CRESTOR) 20 MG tablet TAKE 1 TABLET BY MOUTH EVERYDAY AT BEDTIME   sildenafil (VIAGRA) 50 MG tablet Take 1 tablet (50 mg total) by mouth daily as needed for erectile dysfunction.   [DISCONTINUED] insulin degludec (TRESIBA FLEXTOUCH) 200 UNIT/ML FlexTouch Pen Inject 90 Units into the skin in the morning. (Patient taking differently: Inject 80 Units into the skin in the morning.)   insulin degludec (TRESIBA FLEXTOUCH) 200 UNIT/ML FlexTouch Pen Inject 60 Units into the skin in the morning.   No facility-administered encounter medications on file as of 04/10/2021.    ALLERGIES: No Known Allergies  VACCINATION STATUS: Immunization History  Administered Date(s) Administered   Fluad Quad(high Dose 65+) 04/30/2017, 04/13/2019   Influenza Split 04/19/2014, 05/29/2020   Influenza-Unspecified 05/24/2020   Moderna SARS-COV2 Booster Vaccination 12/13/2020   Moderna Sars-Covid-2 Vaccination 09/30/2019, 10/29/2019, 06/24/2020   Pneumococcal Conjugate-13 04/23/2018   Pneumococcal Polysaccharide-23 12/28/2015   Zoster, Live 01/04/2016    Diabetes He presents for his follow-up diabetic visit. He has type 2 diabetes mellitus. Onset time: diagnosed at approx age of 35. His disease course has been improving. Hypoglycemia symptoms include nervousness/anxiousness, sweats and tremors. Associated symptoms include foot paresthesias. Pertinent negatives for diabetes include no blurred vision, no fatigue, no polydipsia, no polyuria, no visual change, no weakness and no weight loss. Hypoglycemia complications include nocturnal hypoglycemia. Symptoms are improving. Diabetic complications include heart disease, impotence, nephropathy and retinopathy. Risk factors for coronary artery disease include diabetes mellitus, dyslipidemia, family history, hypertension, male sex, obesity, sedentary  lifestyle and tobacco exposure. Current diabetic treatment includes intensive insulin program and oral agent (monotherapy). He is compliant with treatment most of the time. His weight is decreasing steadily. He is following a generally healthy diet. When asked about meal planning, he reported none. He has not had a previous visit with a dietitian. He rarely participates in exercise. His home blood glucose trend is decreasing steadily. (He presents today with his CGM showing at target fasting and postprandial glycemic profile.  His most recent A1c done on 7/29 was 7.1%, decreasing from last visit of 8.9%.  He does report he has found himself eating more frequently to avoid low glucose.  Analysis of  his CGM shows TIR 75%, TAR 22%, TBR 3%.  He self adjusted his Tyler Aas to 70 units nightly to avoid low readings.) An ACE inhibitor/angiotensin II receptor blocker is being taken. He does not see a podiatrist.Eye exam is current.  Hyperlipidemia This is a chronic problem. The current episode started more than 1 year ago. The problem is controlled. Recent lipid tests were reviewed and are normal. Exacerbating diseases include chronic renal disease, diabetes and obesity. Factors aggravating his hyperlipidemia include fatty foods. Current antihyperlipidemic treatment includes statins. The current treatment provides moderate improvement of lipids. Compliance problems include adherence to diet and adherence to exercise.  Risk factors for coronary artery disease include diabetes mellitus, dyslipidemia, family history, male sex, hypertension, obesity and a sedentary lifestyle.  Hypertension This is a chronic problem. The current episode started more than 1 year ago. The problem has been gradually improving since onset. The problem is controlled. Associated symptoms include sweats. Pertinent negatives include no blurred vision. There are no associated agents to hypertension. Risk factors for coronary artery disease include  diabetes mellitus, dyslipidemia, family history, male gender, obesity, sedentary lifestyle and smoking/tobacco exposure. Past treatments include ACE inhibitors and calcium channel blockers. The current treatment provides moderate improvement. There are no compliance problems.  Hypertensive end-organ damage includes kidney disease, CAD/MI and retinopathy. Identifiable causes of hypertension include chronic renal disease.   Review of systems  Constitutional: + steadily decreasing body weight,  current Body mass index is 39.69 kg/m., no fatigue, no subjective hyperthermia, no subjective hypothermia Eyes: + blurry vision (bilateral retinopathy with recent surgery on both eyes), legally blind in right eye, no xerophthalmia ENT: no sore throat, no nodules palpated in throat, no dysphagia/odynophagia, no hoarseness Cardiovascular: no chest pain, no shortness of breath, no palpitations, no leg swelling Respiratory: no cough, no shortness of breath Gastrointestinal: no nausea/vomiting/diarrhea Musculoskeletal: no muscle/joint aches, walks with cane for deconditioning Skin: no rashes, no hyperemia Neurological: no tremors, no numbness, no tingling, no dizziness Psychiatric: no depression, no anxiety   Objective:     BP 128/70   Pulse 83   Ht '6\' 1"'$  (1.854 m)   Wt (!) 300 lb 12.8 oz (136.4 kg)   BMI 39.69 kg/m   Wt Readings from Last 3 Encounters:  04/10/21 (!) 300 lb 12.8 oz (136.4 kg)  03/02/21 296 lb 6.4 oz (134.4 kg)  12/05/20 (!) 308 lb (139.7 kg)     BP Readings from Last 3 Encounters:  04/10/21 128/70  03/02/21 (!) 152/81  12/05/20 (!) 145/84     Physical Exam- Limited  Constitutional:  Body mass index is 39.69 kg/m. , not in acute distress, normal state of mind Eyes:  EOMI, no exophthalmos, right eye opaque conjunctiva Neck: Supple Cardiovascular: RRR, no murmurs, rubs, or gallops, no edema Respiratory: Adequate breathing efforts, no crackles, rales, rhonchi, or  wheezing Musculoskeletal: no gross deformities, strength intact in all four extremities, no gross restriction of joint movements, walks with cane for deconditioning Skin:  no rashes, no hyperemia Neurological: no tremor with outstretched hands    CMP ( most recent) CMP     Component Value Date/Time   NA 142 10/30/2020 0756   K 4.8 10/30/2020 0756   CL 106 10/30/2020 0756   CO2 20 10/30/2020 0756   GLUCOSE 196 (H) 10/30/2020 0756   GLUCOSE 149 (H) 02/09/2019 1340   BUN 17 10/30/2020 0756   CREATININE 1.18 10/30/2020 0756   CALCIUM 10.1 10/30/2020 0756   PROT 7.7 10/30/2020 0756  ALBUMIN 4.1 10/30/2020 0756   AST 26 10/30/2020 0756   ALT 20 10/30/2020 0756   ALKPHOS 102 10/30/2020 0756   BILITOT 0.3 10/30/2020 0756   GFRNONAA 79 04/13/2020 0906   GFRAA 92 04/13/2020 0906     Diabetic Labs (most recent): Lab Results  Component Value Date   HGBA1C 7.1 (H) 03/02/2021   HGBA1C 8.9 (H) 10/30/2020   HGBA1C 8.5 (H) 08/01/2020     Lipid Panel ( most recent) Lipid Panel     Component Value Date/Time   CHOL 116 04/13/2020 0906   TRIG 111 04/13/2020 0906   HDL 43 04/13/2020 0906   CHOLHDL 2.7 04/13/2020 0906   LDLCALC 53 04/13/2020 0906   LABVLDL 20 04/13/2020 0906      Lab Results  Component Value Date   TSH 0.280 (L) 07/05/2019   TSH 0.124 (L) 04/28/2019   TSH 0.059 (L) 04/06/2019           Assessment & Plan:   1) Uncontrolled Type 2 Diabetes with long-term use of insulin  - [name] has currently uncontrolled symptomatic type 2 DM since 70 years of age.   He presents today with his CGM showing at target fasting and postprandial glycemic profile.  His most recent A1c done on 7/29 was 7.1%, decreasing from last visit of 8.9%.  He does report he has found himself eating more frequently to avoid low glucose.  Analysis of his CGM shows TIR 75%, TAR 22%, TBR 3%.  He self adjusted his Tyler Aas to 70 units nightly to avoid low readings.  He has also cut out  sodas and sugary beverages.  -Recent labs reviewed.  - I had a long discussion with him about the progressive nature of diabetes and the pathology behind its complications. -his diabetes is complicated by CKD, CAD and he remains at a high risk for more acute and chronic complications which include CAD, CVA, CKD, retinopathy, and neuropathy. These are all discussed in detail with him.  - Nutritional counseling repeated at each appointment due to patients tendency to fall back in to old habits.  - The patient admits there is a room for improvement in their diet and drink choices. -  Suggestion is made for the patient to avoid simple carbohydrates from their diet including Cakes, Sweet Desserts / Pastries, Ice Cream, Soda (diet and regular), Sweet Tea, Candies, Chips, Cookies, Sweet Pastries, Store Bought Juices, Alcohol in Excess of 1-2 drinks a day, Artificial Sweeteners, Coffee Creamer, and "Sugar-free" Products. This will help patient to have stable blood glucose profile and potentially avoid unintended weight gain.   - I encouraged the patient to switch to unprocessed or minimally processed complex starch and increased protein intake (animal or plant source), fruits, and vegetables.   - Patient is advised to stick to a routine mealtimes to eat 3 meals a day and avoid unnecessary snacks (to snack only to correct hypoglycemia).  - I have approached him with the following individualized plan to manage  his diabetes and patient agrees:   -Based on his tight glucose readings, he is advised to lower his Tyler Aas further to 60 units SQ nightly and continue his Novolog (recently switched due to insurance preference) to 10-16 units TID with meals if glucose is above 90 and he is eating (Specific instructions on how to titrate insulin dosage based on glucose readings given to patient in writing).  He can also continue his Metformin 500 mg ER twice daily with meals.    -  he is encouraged to continue using  his CGM to monitor glucose 4 times daily, before meals and before bed, and to call the clinic if he has readings less than 70 or greater than 300 for 3 tests in a row.  - he is warned not to take insulin without proper monitoring per orders. - Adjustment parameters are given to him for hypo and hyperglycemia in writing.  - his Vania Rea was be discontinued at last visit, risk outweighs benefit for this patient.  - he will be considered for incretin therapy as appropriate next visit.  - Specific targets for  A1c;  LDL, HDL,  and Triglycerides were discussed with the patient.  2) Blood Pressure /Hypertension:  his blood pressure is controlled to target for his age.   he is advised to continue his current medications including Norvasc 5 mg po daily and Lisinopril 40 mg p.o. daily with breakfast.  3) Lipids/Hyperlipidemia:    Review of his recent lipid panel from 04/13/20 showed controlled LDL at 53 .  he  is advised to continue Crestor 20 mg daily at bedtime.  Side effects and precautions discussed with him.  Will recheck lipid panel prior to next visit.  4)  Weight/Diet:  his Body mass index is 39.69 kg/m.  -  clearly complicating his diabetes care.   he is a candidate for weight loss. I discussed with him the fact that loss of 5 - 10% of his  current body weight will have the most impact on his diabetes management.  Exercise, and detailed carbohydrates information provided  -  detailed on discharge instructions.  5) Chronic Care/Health Maintenance: -he is on ACEI/ARB and Statin medications and is encouraged to initiate and continue to follow up with Ophthalmology, Dentist, Podiatrist at least yearly or according to recommendations, and advised to stay away from smoking. I have recommended yearly flu vaccine and pneumonia vaccine at least every 5 years; moderate intensity exercise for up to 150 minutes weekly; and sleep for at least 7 hours a day.  - he is advised to maintain close follow up with  [name], DO for primary care needs, as well as his other providers for optimal and coordinated care.      I spent 35 minutes in the care of the patient today including review of labs from [place], Lipids, Thyroid Function, Hematology (current and previous including abstractions from other facilities); face-to-face time discussing  his blood glucose readings/logs, discussing hypoglycemia and hyperglycemia episodes and symptoms, medications doses, his options of short and long term treatment based on the latest standards of care / guidelines;  discussion about incorporating lifestyle medicine;  and documenting the encounter.    Please refer to Patient Instructions for Blood Glucose Monitoring and Insulin/Medications Dosing Guide"  in media tab for additional information. Please  also refer to " Patient Self Inventory" in the Media  tab for reviewed elements of pertinent patient history.  [name] participated in the discussions, expressed understanding, and voiced agreement with the above plans.  All questions were answered to his satisfaction. he is encouraged to contact clinic should he have any questions or concerns prior to his return visit.   Follow up plan: - Return in about 4 months (around 08/10/2021) for Diabetes F/U with A1c in office, Previsit labs, Bring meter and logs.  Rayetta Pigg, [facility] [address] [place], [place] [address] Phone: [phone] Fax: [phone]  04/10/2021, 10:55 AM

## 2021-04-12 ENCOUNTER — Telehealth: Payer: Medicare Other

## 2021-04-19 ENCOUNTER — Other Ambulatory Visit: Payer: Self-pay | Admitting: Family Medicine

## 2021-05-02 ENCOUNTER — Other Ambulatory Visit: Payer: Self-pay | Admitting: Family Medicine

## 2021-05-09 ENCOUNTER — Telehealth: Payer: Medicare Other

## 2021-05-13 ENCOUNTER — Other Ambulatory Visit: Payer: Self-pay | Admitting: Family Medicine

## 2021-05-13 DIAGNOSIS — R12 Heartburn: Secondary | ICD-10-CM

## 2021-05-23 DIAGNOSIS — Z794 Long term (current) use of insulin: Secondary | ICD-10-CM | POA: Diagnosis not present

## 2021-05-23 DIAGNOSIS — E1140 Type 2 diabetes mellitus with diabetic neuropathy, unspecified: Secondary | ICD-10-CM | POA: Diagnosis not present

## 2021-06-05 ENCOUNTER — Telehealth: Payer: Self-pay | Admitting: Pharmacist

## 2021-06-05 NOTE — Telephone Encounter (Signed)
Patient still receiving [place] and Novolog via [employer] n[employer] patient assistance program Updated med list  He is receiving [place] 14 day system via [employer] (using parachute portal)

## 2021-06-11 ENCOUNTER — Other Ambulatory Visit: Payer: Self-pay

## 2021-06-11 ENCOUNTER — Ambulatory Visit (INDEPENDENT_AMBULATORY_CARE_PROVIDER_SITE_OTHER): Payer: Medicare Other | Admitting: Family Medicine

## 2021-06-11 ENCOUNTER — Encounter: Payer: Self-pay | Admitting: Family Medicine

## 2021-06-11 VITALS — BP 139/80 | HR 88 | Temp 98.1°F | Resp 20 | Ht 73.0 in | Wt 299.0 lb

## 2021-06-11 DIAGNOSIS — Z79899 Other long term (current) drug therapy: Secondary | ICD-10-CM | POA: Diagnosis not present

## 2021-06-11 DIAGNOSIS — E785 Hyperlipidemia, unspecified: Secondary | ICD-10-CM

## 2021-06-11 DIAGNOSIS — E1169 Type 2 diabetes mellitus with other specified complication: Secondary | ICD-10-CM

## 2021-06-11 DIAGNOSIS — F411 Generalized anxiety disorder: Secondary | ICD-10-CM

## 2021-06-11 DIAGNOSIS — Z794 Long term (current) use of insulin: Secondary | ICD-10-CM

## 2021-06-11 DIAGNOSIS — R051 Acute cough: Secondary | ICD-10-CM | POA: Diagnosis not present

## 2021-06-11 DIAGNOSIS — I152 Hypertension secondary to endocrine disorders: Secondary | ICD-10-CM | POA: Diagnosis not present

## 2021-06-11 DIAGNOSIS — E113553 Type 2 diabetes mellitus with stable proliferative diabetic retinopathy, bilateral: Secondary | ICD-10-CM

## 2021-06-11 DIAGNOSIS — E1159 Type 2 diabetes mellitus with other circulatory complications: Secondary | ICD-10-CM

## 2021-06-11 LAB — BAYER DCA HB A1C WAIVED: HB A1C (BAYER DCA - WAIVED): 8 % — ABNORMAL HIGH (ref 4.8–5.6)

## 2021-06-11 MED ORDER — ALPRAZOLAM 1 MG PO TABS: 0.5000 mg | ORAL_TABLET | Freq: Three times a day (TID) | ORAL | 1 refills | Status: DC | PRN

## 2021-06-11 MED ORDER — AMLODIPINE BESYLATE 5 MG PO TABS: 5.0000 mg | ORAL_TABLET | Freq: Every day | ORAL | 3 refills | Status: DC

## 2021-06-11 MED ORDER — MIRTAZAPINE 45 MG PO TABS: 45.0000 mg | ORAL_TABLET | Freq: Every day | ORAL | 3 refills | Status: DC

## 2021-06-11 MED ORDER — BENZONATATE 100 MG PO CAPS
100.0000 mg | ORAL_CAPSULE | Freq: Three times a day (TID) | ORAL | 0 refills | Status: DC | PRN
Start: 2021-06-11 — End: 2021-08-13

## 2021-06-11 MED ORDER — LISINOPRIL 40 MG PO TABS: 40.0000 mg | ORAL_TABLET | Freq: Every day | ORAL | 3 refills | Status: DC

## 2021-06-11 MED ORDER — ROSUVASTATIN CALCIUM 20 MG PO TABS: ORAL_TABLET | ORAL | 3 refills | Status: DC

## 2021-06-11 NOTE — Patient Instructions (Signed)
Sugar has gone up since your visit with Whitney.  Not sure why.  Make sure that you are keeping up with your medications.

## 2021-06-11 NOTE — Progress Notes (Signed)
Subjective: CC:DM PCP: [name], DO [name] is a 70 y.o. male presenting to clinic today for:  1. Type 2 Diabetes with hypertension, hyperlipidemia and retinopathy:  Sees endocrinology.  Next OV in January.  He brings his freestyle libre today and notes that his blood sugars have been running most times below 200.  He is only seen an occasional above 200.  He is not exercising.  He reports compliance with all of his medicines however.  Last eye exam: needs, scheduled with [facility] this month Last foot exam: UTD Last A1c:  Lab Results  Component Value Date   HGBA1C 7.1 (H) 03/02/2021   Nephropathy screen indicated?: UTD Last flu, zoster and/or pneumovax:  Immunization History  Administered Date(s) Administered   Fluad Quad(high Dose 65+) 04/30/2017, 04/13/2019   Influenza Split 04/19/2014, 05/29/2020   Influenza-Unspecified 05/24/2020   Moderna SARS-COV2 Booster Vaccination 12/13/2020   Moderna Sars-Covid-2 Vaccination 09/30/2019, 10/29/2019, 06/24/2020   Pneumococcal Conjugate-13 04/23/2018   Pneumococcal Polysaccharide-23 12/28/2015   Zoster, Live 01/04/2016    ROS: No chest pain, shortness of breath reported  2. GAD Continues to suffer from anxiety and panic attacks but uses alprazolam sparingly.  Last refill was October 4.  Needs renewal as that was his last refill.  Denies any excessive daytime sedation, changes in memory, visual or auditory hallucinations.  Continues to take mirtazapine at night   ROS: Per HPI  No Known Allergies Past Medical History:  Diagnosis Date   Anxiety    Arthritis    Diabetes mellitus without complication (HCC)    GERD (gastroesophageal reflux disease)    Hypercholesteremia    Hypertension     Current Outpatient Medications:    ALPRAZolam (XANAX) 1 MG tablet, Take 0.5-1 tablets (0.5-1 mg total) by mouth 3 (three) times daily as needed for anxiety (take ONLY IF NEEDED)., Disp: 90 tablet, Rfl: 1   amLODipine  (NORVASC) 5 MG tablet, Take 1 tablet (5 mg total) by mouth daily., Disp: 90 tablet, Rfl: 3   Continuous Blood Gluc Sensor (FREESTYLE LIBRE 14 DAY SENSOR) MISC, Scan to check glucose at least 4 times daily. DX: E11.4, Disp: 2 each, Rfl: 12   famotidine (PEPCID) 20 MG tablet, TAKE 1 TABLET BY MOUTH TWICE A DAY, Disp: 180 tablet, Rfl: 1   glucose blood (ONETOUCH VERIO) test strip, Use as instructed to monitor glucose 4 times daily (use as back up method for CGM), Disp: 100 each, Rfl: 12   insulin aspart (NOVOLOG FLEXPEN) 100 UNIT/ML FlexPen, Inject 10-16 Units into the skin 3 (three) times daily with meals., Disp: , Rfl:    insulin degludec (TRESIBA FLEXTOUCH) 200 UNIT/ML FlexTouch Pen, Inject 60 Units into the skin in the morning., Disp: 18 mL, Rfl: 5   Insulin Pen Needle (B-D ULTRAFINE III SHORT PEN) 31G X 8 MM MISC, Use to give insulin daily Dx E11.9, Disp: 100 each, Rfl: 3   Lancets (ONETOUCH ULTRASOFT) lancets, Use as instructed to monitor glucose 4 times daily (use as back up method for CGM)., Disp: 100 each, Rfl: 12   lisinopril (ZESTRIL) 40 MG tablet, Take 1 tablet (40 mg total) by mouth daily., Disp: 90 tablet, Rfl: 3   meclizine (ANTIVERT) 25 MG tablet, Take 25 mg by mouth 3 (three) times daily as needed., Disp: , Rfl:    metFORMIN (GLUCOPHAGE-XR) 500 MG 24 hr tablet, TAKE 2 TABLETS BY MOUTH TWICE A DAY WITH FOOD, Disp: 360 tablet, Rfl: 3   mirtazapine (REMERON) 45 MG tablet,  Take 1 tablet (45 mg total) by mouth at bedtime., Disp: 90 tablet, Rfl: 0   omeprazole (PRILOSEC) 20 MG capsule, Take by mouth., Disp: , Rfl:    rosuvastatin (CRESTOR) 20 MG tablet, TAKE 1 TABLET BY MOUTH EVERYDAY AT BEDTIME, Disp: 90 tablet, Rfl: 0   sildenafil (VIAGRA) 50 MG tablet, Take 1 tablet (50 mg total) by mouth daily as needed for erectile dysfunction., Disp: 10 tablet, Rfl: 2 Social History   Socioeconomic History   Marital status: Divorced    Spouse name: Not on file   Number of children: Not on file    Years of education: Not on file   Highest education level: Not on file  Occupational History   Not on file  Tobacco Use   Smoking status: Former    Packs/day: 0.50    Years: 41.00    Pack years: 20.50    Types: Cigarettes    Quit date: 02/29/2012    Years since quitting: 9.2   Smokeless tobacco: Never  Vaping Use   Vaping Use: Never used  Substance and Sexual Activity   Alcohol use: No    Comment: quit 2012   Drug use: No    Comment: quit in 2012   Sexual activity: Yes    Birth control/protection: None  Other Topics Concern   Not on file  Social History Narrative   Not on file   Social Determinants of Health   Financial Resource Strain: Not on file  Food Insecurity: Not on file  Transportation Needs: Not on file  Physical Activity: Not on file  Stress: Not on file  Social Connections: Not on file  Intimate Partner Violence: Not on file   Family History  Problem Relation Age of Onset   Diabetes Mother    Glaucoma Mother     Objective: Office vital signs reviewed. BP 139/80   Pulse 88   Temp 98.1 F (36.7 C)   Resp 20   Ht 6' 1" (1.854 m)   Wt 299 lb (135.6 kg)   SpO2 94%   BMI 39.45 kg/m   Physical Examination:  General: Awake, alert, morbidly obese, No acute distress HEENT: Normal; sclera white.  Exotropia noted. Cardio: regular rate and rhythm, S1S2 heard, no murmurs appreciated Pulm: clear to auscultation bilaterally, no wheezes, rhonchi or rales; normal work of breathing on room air MSK: Antalgic gait  Assessment/ Plan: 70 y.o. 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, with long-term current use of insulin ([place]) - Plan: Bayer DCA Hb A1c Waived  Hyperlipidemia associated with type 2 diabetes mellitus ([place]) - Plan: CMP14+EGFR, Lipid Panel  Hypertension associated with diabetes ([place]) - Plan: CMP14+EGFR  Morbid obesity ([place]) - Plan: CMP14+EGFR, Lipid Panel  GAD (generalized anxiety disorder) - Plan:  ALPRAZolam (XANAX) 1 MG tablet, CANCELED: ToxASSURE Select 13 (MW), Urine  Chronic prescription benzodiazepine use - Plan: CANCELED: ToxASSURE Select 13 (MW), Urine  Controlled substance agreement signed - Plan: Drug Screen 10 W/Conf, Se, CANCELED: ToxASSURE Select 13 (MW), Urine  Acute cough - Plan: benzonatate (TESSALON PERLES) 100 MG capsule  Sugars not at goal unfortunately has had a very large rise in A1c since his last check.  He is now at 8 despite reported compliance with medications.  I will CC dizziness cardiologist as [name] should they want to adjust his insulin regimen prior to the next visit  CMP and lipid panel ordered.  Continue statin  Blood pressure is controlled.  No  changes  Xanax renewed.  National narcotic database was reviewed and there were no red flags.  Reinforced sparing use of this high risk medication.  UDS and CSC were updated as per office policy  Complained of cough during exam today.  Does not appear to be infected nor does he report any other concerning symptoms or signs except for the cough.  Tessalon Perles sent.  Lung exam unremarkable  Orders Placed This Encounter  Procedures   Bayer DCA Hb A1c Waived    Order Specific Question:   CC Results    Answer:   Brita Romp [[id number]]   CMP14+EGFR   Lipid Panel   Drug Screen 10 W/Conf, Se   Meds ordered this encounter  Medications   ALPRAZolam (XANAX) 1 MG tablet    Sig: Take 0.5-1 tablets (0.5-1 mg total) by mouth 3 (three) times daily as needed for anxiety (take ONLY IF NEEDED).    Dispense:  90 tablet    Refill:  1   amLODipine (NORVASC) 5 MG tablet    Sig: Take 1 tablet (5 mg total) by mouth daily.    Dispense:  90 tablet    Refill:  3   lisinopril (ZESTRIL) 40 MG tablet    Sig: Take 1 tablet (40 mg total) by mouth daily.    Dispense:  90 tablet    Refill:  3   mirtazapine (REMERON) 45 MG tablet    Sig: Take 1 tablet (45 mg total) by mouth at bedtime.    Dispense:  90 tablet    Refill:   3   rosuvastatin (CRESTOR) 20 MG tablet    Sig: TAKE 1 TABLET BY MOUTH EVERYDAY AT BEDTIME    Dispense:  90 tablet    Refill:  3   benzonatate (TESSALON PERLES) 100 MG capsule    Sig: Take 1 capsule (100 mg total) by mouth 3 (three) times daily as needed for cough.    Dispense:  20 capsule    Refill:  [place], DO [place] [phone]

## 2021-06-12 LAB — CMP14+EGFR
ALT: 32 IU/L (ref 0–44)
AST: 34 IU/L (ref 0–40)
Albumin/Globulin Ratio: 1.2 (ref 1.2–2.2)
Albumin: 4.1 g/dL (ref 3.8–4.8)
Alkaline Phosphatase: 141 IU/L — ABNORMAL HIGH (ref 44–121)
BUN/Creatinine Ratio: 13 (ref 10–24)
BUN: 12 mg/dL (ref 8–27)
Bilirubin Total: 0.7 mg/dL (ref 0.0–1.2)
CO2: 24 mmol/L (ref 20–29)
Calcium: 9.2 mg/dL (ref 8.6–10.2)
Chloride: 104 mmol/L (ref 96–106)
Creatinine, Ser: 0.93 mg/dL (ref 0.76–1.27)
Globulin, Total: 3.3 g/dL (ref 1.5–4.5)
Glucose: 184 mg/dL — ABNORMAL HIGH (ref 70–99)
Potassium: 4 mmol/L (ref 3.5–5.2)
Sodium: 141 mmol/L (ref 134–144)
Total Protein: 7.4 g/dL (ref 6.0–8.5)
eGFR: 88 mL/min/{1.73_m2} (ref >59)

## 2021-06-12 LAB — LIPID PANEL
Chol/HDL Ratio: 3.8 ratio (ref 0.0–5.0)
Cholesterol, Total: 103 mg/dL (ref 100–199)
HDL: 27 mg/dL — ABNORMAL LOW (ref >39)
LDL Chol Calc (NIH): 57 mg/dL (ref 0–99)
Triglycerides: 98 mg/dL (ref 0–149)
VLDL Cholesterol Cal: 19 mg/dL (ref 5–40)

## 2021-06-13 ENCOUNTER — Telehealth: Payer: Medicare Other

## 2021-06-21 ENCOUNTER — Ambulatory Visit (INDEPENDENT_AMBULATORY_CARE_PROVIDER_SITE_OTHER): Payer: Medicare Other | Admitting: Pharmacist

## 2021-06-21 ENCOUNTER — Other Ambulatory Visit: Payer: Self-pay

## 2021-06-21 DIAGNOSIS — E785 Hyperlipidemia, unspecified: Secondary | ICD-10-CM

## 2021-06-21 DIAGNOSIS — E1169 Type 2 diabetes mellitus with other specified complication: Secondary | ICD-10-CM

## 2021-06-21 DIAGNOSIS — Z794 Long term (current) use of insulin: Secondary | ICD-10-CM

## 2021-06-21 MED ORDER — FREESTYLE LIBRE 14 DAY SENSOR MISC: 12 refills | Status: DC

## 2021-06-21 NOTE — Progress Notes (Signed)
Chronic Care Management Pharmacy Note  06/21/2021 Name:  [name] MRN:  [id number] DOB:  May 25, 1951  Summary: t2dm  Diabetes: UNcontrolled; current treatment: TRESIBA 70-80 units daily, NOVOLOG 16-20 units with meals,metformin;  A1c INCREASED TO 8% Consider GLP1/sglt2  Follows with endocrine Admits to only taking AM dose of metformin--encouraged patient to take as prescribed Current glucose readings: fasting glucose: <170, post prandial glucose: 150-200s Uses libre CGM/[facility] Denies hypoglycemic/hyperglycemic symptoms Discussed meal planning options and Plate method for healthy eating Avoid sugary drinks and desserts Incorporate balanced protein, non starchy veggies, 1 serving of carbohydrate with each meal Increase water intake Increase physical activity as able Current exercise: n/a Assessed patient finances. Application pending for novo nordisk patient assistance program--patient has 75% LIS so unsure if novo accepts this Education provided on diet and lifestyle modifications   Patient Goals/Self-Care Activities Over the next 90 days, patient will:  - take medications as prescribed check glucose 3 times daily or if symptomatic, document, and provide at future appointments  Follow Up Plan: Telephone follow up appointment with care management team member scheduled for: 2 weeks  Subjective: [name] is an 70 y.o. year old male who is a primary patient of [name], DO.  The CCM team was consulted for assistance with disease management and care coordination needs.    Engaged with patient face to face for follow up visit in response to provider referral for pharmacy case management and/or care coordination services.   Consent to Services:  The patient was given information about Chronic Care Management services, agreed to services, and gave verbal consent prior to initiation of services.  Please see initial visit note for detailed documentation.    Patient Care Team: [name], DO as PCP - General (Family Medicine) Lavera Guise, [facility] (Pharmacist)   Objective:  Lab Results  Component Value Date   CREATININE 0.93 06/11/2021   CREATININE 1.18 10/30/2020   CREATININE 0.97 04/13/2020    Lab Results  Component Value Date   HGBA1C 8.0 (H) 06/11/2021   Last diabetic Eye exam:  Lab Results  Component Value Date/Time   HMDIABEYEEXA Retinopathy (A) 04/20/2019 12:00 AM    Last diabetic Foot exam: No results found for: HMDIABFOOTEX      Component Value Date/Time   CHOL 103 06/11/2021 0958   TRIG 98 06/11/2021 0958   HDL 27 (L) 06/11/2021 0958   CHOLHDL 3.8 06/11/2021 0958   LDLCALC 57 06/11/2021 0958    Hepatic Function Latest Ref Rng & Units 06/11/2021 10/30/2020 04/13/2020  Total Protein 6.0 - 8.5 g/dL 7.4 7.7 7.5  Albumin 3.8 - 4.8 g/dL 4.1 4.1 4.1  AST 0 - 40 IU/L 34 26 15  ALT 0 - 44 IU/L 32 20 23  Alk Phosphatase 44 - 121 IU/L 141(H) 102 67  Total Bilirubin 0.0 - 1.2 mg/dL 0.7 0.3 0.3    Lab Results  Component Value Date/Time   TSH 0.280 (L) 07/05/2019 09:19 AM   TSH 0.124 (L) 04/28/2019 08:19 AM    CBC Latest Ref Rng & Units 04/13/2020 01/11/2020 10/08/2019  WBC 3.4 - 10.8 x10E3/uL 8.0 6.5 7.9  Hemoglobin 13.0 - 17.7 g/dL 14.0 13.0 13.6  Hematocrit 37.5 - 51.0 % 44.0 40.5 44.6  Platelets 150 - 450 x10E3/uL 259 244 248    Lab Results  Component Value Date/Time   VD25OH 49.1 04/06/2019 10:36 AM    Clinical ASCVD: No  The ASCVD Risk score (Arnett DK, et al.,  2019) failed to calculate for the following reasons:   The valid total cholesterol range is 130 to 320 mg/dL    Other: (CHADS2VASc if Afib, PHQ9 if depression, MMRC or CAT for COPD, ACT, DEXA)  Social History   Tobacco Use  Smoking Status Former   Packs/day: 0.50   Years: 41.00   Pack years: 20.50   Types: Cigarettes   Quit date: 02/29/2012   Years since quitting: 9.3  Smokeless Tobacco Never   BP Readings from Last 3 Encounters:   06/11/21 139/80  04/10/21 128/70  03/02/21 (!) 152/81   Pulse Readings from Last 3 Encounters:  06/11/21 88  04/10/21 83  03/02/21 81   Wt Readings from Last 3 Encounters:  06/11/21 299 lb (135.6 kg)  04/10/21 (!) 300 lb 12.8 oz (136.4 kg)  03/02/21 296 lb 6.4 oz (134.4 kg)    Assessment: Review of patient past medical history, allergies, medications, health status, including review of consultants reports, laboratory and other test data, was performed as part of comprehensive evaluation and provision of chronic care management services.   SDOH:  (Social Determinants of Health) assessments and interventions performed:    CCM Care Plan  No Known Allergies  Medications Reviewed Today     Reviewed by Lavera Guise, [facility] (Pharmacist) on 06/21/21 at 1525  Med List Status: <None>   Medication Order Taking? Sig Documenting Provider Last Dose Status Informant  ALPRAZolam (XANAX) 1 MG tablet [id number]  Take 0.5-1 tablets (0.5-1 mg total) by mouth 3 (three) times daily as needed for anxiety (take ONLY IF NEEDED). Ronnie Doss M, DO  Active   amLODipine (NORVASC) 5 MG tablet [id number]  Take 1 tablet (5 mg total) by mouth daily. Ronnie Doss M, DO  Active   benzonatate (TESSALON PERLES) 100 MG capsule [id number]  Take 1 capsule (100 mg total) by mouth 3 (three) times daily as needed for cough. Ronnie Doss M, DO  Active   Continuous Blood Gluc Sensor (FREESTYLE LIBRE 14 DAY SENSOR) [place] [id number] No Scan to check glucose at least 4 times daily. DX: E11.4 [name], DO Taking Active            Med Note Gilmer Mor Jun 05, 2021  2:59 PM) Via [facility]  famotidine (PEPCID) 20 MG tablet [id number] No TAKE 1 TABLET BY MOUTH TWICE A DAY Ronnie Doss M, DO Taking Active   glucose blood (ONETOUCH VERIO) test strip [id number] No Use as instructed to monitor glucose 4 times daily (use as back up method for CGM) Brita Romp, NP Taking Active    insulin aspart (NOVOLOG FLEXPEN) 100 UNIT/ML FlexPen [id number] No Inject 10-16 Units into the skin 3 (three) times daily with meals. [provider] Taking Active            Med Note Blanca Friend, Royce Macadamia   Tue Jun 05, 2021  2:59 PM) Via novo nordisk patient assistance program  insulin degludec (TRESIBA FLEXTOUCH) 200 UNIT/ML FlexTouch Pen [id number] No Inject 60 Units into the skin in the morning. Brita Romp, NP Taking Active            Med Note Gilmer Mor Jun 05, 2021  2:56 PM) Via novo nordisk patient assistance program  Insulin Pen Needle (B-D ULTRAFINE III SHORT PEN) 31G X 8 MM MISC [id number] No Use to give insulin daily Dx E11.9 Baruch Gouty, FNP Taking Active   Lancets [facility] ULTRASOFT) lancets [id number]  No Use as instructed to monitor glucose 4 times daily (use as back up method for CGM). Brita Romp, NP Taking Active   lisinopril (ZESTRIL) 40 MG tablet [id number]  Take 1 tablet (40 mg total) by mouth daily. Ronnie Doss M, DO  Active   metFORMIN (GLUCOPHAGE-XR) 500 MG 24 hr tablet [id number] No TAKE 2 TABLETS BY MOUTH TWICE A DAY WITH FOOD Ronnie Doss M, DO Taking Active   mirtazapine (REMERON) 45 MG tablet [id number]  Take 1 tablet (45 mg total) by mouth at bedtime. Ronnie Doss M, DO  Active   omeprazole (PRILOSEC) 20 MG capsule [id number] No Take by mouth. [provider] Taking Active   rosuvastatin (CRESTOR) 20 MG tablet [id number]  TAKE 1 TABLET BY MOUTH EVERYDAY AT BEDTIME Ronnie Doss M, DO  Active   sildenafil (VIAGRA) 50 MG tablet [id number] No Take 1 tablet (50 mg total) by mouth daily as needed for erectile dysfunction. [name], DO Taking Active   Med List Note Loetta Rough 03/31/13 1303): Lisinopril (strength)             Patient Active Problem List   Diagnosis Date Noted   Proliferative diabetic retinopathy of left eye with macular edema associated with type 2 diabetes mellitus ([place])  11/09/2019   Diabetic macular edema of right eye with proliferative retinopathy associated with type 2 diabetes mellitus ([place]) 11/09/2019   Optic atrophy associated with retinal dystrophies 11/09/2019   Primary open angle glaucoma of both eyes, severe stage 11/09/2019   Low TSH level 07/07/2019   Diabetic retinopathy of both eyes ([place]) 07/07/2019   Constipation due to pain medication 04/06/2019   Morbid obesity (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, with long-term current use of insulin ([place]) 10/12/2018   Primary open angle glaucoma (POAG) of right eye, severe stage 09/15/2017   Coronary artery disease due to lipid rich plaque 10/28/2016   Gastroesophageal reflux disease without esophagitis 01/30/2016   Chronic bilateral low back pain without sciatica 01/03/2015   Hypertension associated with diabetes ([place]) 01/03/2015   Primary osteoarthritis involving multiple joints 01/03/2015   Vitamin D deficiency 01/03/2015   GAD (generalized anxiety disorder) 01/03/2015   Erectile dysfunction associated with type 2 diabetes mellitus ([place]) 12/21/2013    Immunization History  Administered Date(s) Administered   Fluad Quad(high Dose 65+) 04/30/2017, 04/13/2019   Influenza Split 04/19/2014, 05/29/2020   Influenza-Unspecified 03/19/2016, 04/22/2018, 05/24/2020   Moderna SARS-COV2 Booster Vaccination 12/13/2020   Moderna Sars-Covid-2 Vaccination 09/30/2019, 10/29/2019, 06/24/2020, 12/13/2020   Pneumococcal Conjugate-13 04/23/2018   Pneumococcal Polysaccharide-23 12/28/2015   Zoster, Live 01/04/2016    Conditions to be addressed/monitored: HLD and DMII  Care Plan : PHARMD MEDICATION MANAGEMENT  Updates made by Lavera Guise, RPH since 06/27/2021 12:00 AM     Problem: DISEASE PROGRESSION PREVENTION      Long-Range Goal: T2DM   This Visit's Progress: Not on  track  Recent Progress: On track  Priority: High  Note:   Current Barriers:  Unable to independently afford treatment regimen Unable to maintain control of T2DM  Pharmacist Clinical Goal(s):  Over the next 90 days, patient will verbalize ability to afford treatment regimen maintain control of T2DM as evidenced by [place]  through collaboration with PharmD and provider.    Interventions: 1:1 collaboration with [name], DO regarding development and update of comprehensive  plan of care as evidenced by provider attestation and co-signature Inter-disciplinary care team collaboration (see longitudinal plan of care) Comprehensive medication review performed; medication list updated in electronic medical record  Diabetes: UNcontrolled; current treatment: TRESIBA 70-80 units daily, NOVOLOG 16-20 units with meals,metformin;  A1c INCREASED TO 8% Consider GLP1/sglt2  Follows with endocrine Admits to only taking AM dose of metformin--encouraged patient to take as prescribed Current glucose readings: fasting glucose: <170, post prandial glucose: 150-200s Uses libre CGM/[facility] Denies hypoglycemic/hyperglycemic symptoms Discussed meal planning options and Plate method for healthy eating Avoid sugary drinks and desserts Incorporate balanced protein, non starchy veggies, 1 serving of carbohydrate with each meal Increase water intake Increase physical activity as able Current exercise: n/a Assessed patient finances. Application pending for novo nordisk patient assistance program--patient has 75% LIS so unsure if novo accepts this Education provided on diet and lifestyle modifications   Patient Goals/Self-Care Activities Over the next 90 days, patient will:  - take medications as prescribed check glucose 3 times daily or if symptomatic, document, and provide at future appointments  Follow Up Plan: Telephone follow up appointment with care management team  member scheduled for: 2 weeks      Medication Assistance: Application for novo nordisk  medication assistance program. in process.  Anticipated assistance start date tbd.  See plan of care for additional detail.  Patient's preferred pharmacy is:  [id number] [place]Phone: [phone]: [phone] [address]Phone: [phone]: [phone] [address]Phone: [phone]: [address] [address]Phone: [phone]: [phone] Follow Up:  Patient agrees to Care Plan and Follow-up.  Plan: Telephone follow up appointment with care management team member scheduled for:  2 weeks  JRegina Eck PharmD, BCPS Clinical Pharmacist, [place] Phone [phone]

## 2021-06-22 LAB — DRUG SCREEN 10 W/CONF, SERUM
Amphetamines, IA: NEGATIVE ng/mL
Barbiturates, IA: NEGATIVE ug/mL
Benzodiazepines, IA: POSITIVE ng/mL — AB
Cocaine & Metabolite, IA: NEGATIVE ng/mL
Methadone, IA: NEGATIVE ng/mL
Opiates, IA: NEGATIVE ng/mL
Oxycodones, IA: NEGATIVE ng/mL
Phencyclidine, IA: NEGATIVE ng/mL
Propoxyphene, IA: NEGATIVE ng/mL
THC(Marijuana) Metabolite, IA: NEGATIVE ng/mL

## 2021-06-22 LAB — BENZODIAZEPINES,MS,WB/SP RFX
7-Aminoclonazepam: NEGATIVE ng/mL
Alprazolam: 32.2 ng/mL
Benzodiazepines Confirm: POSITIVE
Chlordiazepoxide: NEGATIVE
Clonazepam: NEGATIVE ng/mL
Desalkylflurazepam: NEGATIVE ng/mL
Desmethylchlordiazepoxide: NEGATIVE
Desmethyldiazepam: NEGATIVE ng/mL
Diazepam: NEGATIVE ng/mL
Flurazepam: NEGATIVE ng/mL
Lorazepam: NEGATIVE ng/mL
Midazolam: NEGATIVE ng/mL
Oxazepam: NEGATIVE ng/mL
Temazepam: NEGATIVE ng/mL
Triazolam: NEGATIVE ng/mL

## 2021-06-27 NOTE — Patient Instructions (Signed)
Visit Information  Thank you for taking time to visit with me today. Please don't hesitate to contact me if I can be of assistance to you before our next scheduled telephone appointment.  Following are the goals we discussed today:  Current Barriers:  Unable to independently afford treatment regimen Unable to maintain control of T2DM  Pharmacist Clinical Goal(s):  Over the next 90 days, patient will verbalize ability to afford treatment regimen maintain control of T2DM as evidenced by [place]  through collaboration with PharmD and provider.    Interventions: 1:1 collaboration with [name], DO regarding development and update of comprehensive plan of care as evidenced by provider attestation and co-signature Inter-disciplinary care team collaboration (see longitudinal plan of care) Comprehensive medication review performed; medication list updated in electronic medical record  Diabetes: UNcontrolled; current treatment: TRESIBA 70-80 units daily, NOVOLOG 16-20 units with meals,metformin;  A1c INCREASED TO 8% Consider GLP1/sglt2  Follows with endocrine Admits to only taking AM dose of metformin--encouraged patient to take as prescribed Current glucose readings: fasting glucose: <170, post prandial glucose: 150-200s Uses libre CGM/[facility] Denies hypoglycemic/hyperglycemic symptoms Discussed meal planning options and Plate method for healthy eating Avoid sugary drinks and desserts Incorporate balanced protein, non starchy veggies, 1 serving of carbohydrate with each meal Increase water intake Increase physical activity as able Current exercise: n/a Assessed patient finances. Application pending for novo nordisk patient assistance program--patient has 75% LIS so unsure if novo accepts this Education provided on diet and lifestyle modifications  Patient Goals/Self-Care Activities Over the next 90 days, patient will:  - take medications as  prescribed check glucose 3 times daily or if symptomatic, document, and provide at future appointments  Follow Up Plan: Telephone follow up appointment with care management team member scheduled for: 2 weeks   Please call the care guide team at [phone] if you need to cancel or reschedule your appointment.   The patient verbalized understanding of instructions, educational materials, and care plan provided today and declined offer to receive copy of patient instructions, educational materials, and care plan.   Signature Regina Eck, PharmD, BCPS Clinical Pharmacist, [place]  II Phone [phone]

## 2021-07-17 ENCOUNTER — Ambulatory Visit (INDEPENDENT_AMBULATORY_CARE_PROVIDER_SITE_OTHER): Payer: Medicare Other | Admitting: Pharmacist

## 2021-07-17 DIAGNOSIS — Z794 Long term (current) use of insulin: Secondary | ICD-10-CM

## 2021-07-17 DIAGNOSIS — I152 Hypertension secondary to endocrine disorders: Secondary | ICD-10-CM

## 2021-07-25 NOTE — Patient Instructions (Signed)
Visit Information  Thank you for taking time to visit with me today. Please don't hesitate to contact me if I can be of assistance to you before our next scheduled telephone appointment.  Following are the goals we discussed today:  Current Barriers:  Unable to independently afford treatment regimen Unable to maintain control of T2DM  Pharmacist Clinical Goal(s):  Over the next 90 days, patient will verbalize ability to afford treatment regimen maintain control of T2DM as evidenced by [place]  through collaboration with PharmD and provider.    Interventions: 1:1 collaboration with [name], DO regarding development and update of comprehensive plan of care as evidenced by provider attestation and co-signature Inter-disciplinary care team collaboration (see longitudinal plan of care) Comprehensive medication review performed; medication list updated in electronic medical record  Diabetes: UNcontrolled; current treatment: TRESIBA 70-80 units daily, NOVOLOG 16-20 units with meals, metformin;  A1c INCREASED TO 8% Consider GLP1/sglt2  Follows with endocrine Admits to only taking AM dose of metformin--encouraged patient to take as prescribed Current glucose readings: fasting glucose: <170, post prandial glucose: 150-200s Uses libre CGM/[facility] via [employer] portal Denies hypoglycemic/hyperglycemic symptoms Discussed meal planning options and Plate method for healthy eating Avoid sugary drinks and desserts Incorporate balanced protein, non starchy veggies, 1 serving of carbohydrate with each meal Increase water intake Increase physical activity as able Current exercise: n/a Assessed patient finances. Re-enrollment submitted to novo nordisk patient assistance program for 2023--patient may not qualify since he has 75% LIS now; if approved, medications will ship to PCP office Education provided on diet and lifestyle modifications   Patient  Goals/Self-Care Activities Over the next 90 days, patient will:  - take medications as prescribed check glucose 3 times daily or if symptomatic, document, and provide at future appointments  Follow Up Plan: Telephone follow up appointment with care management team member scheduled for: 3 months   Please call the care guide team at [phone] if you need to cancel or reschedule your appointment.    The patient verbalized understanding of instructions, educational materials, and care plan provided today and declined offer to receive copy of patient instructions, educational materials, and care plan.   Regina Eck, PharmD, BCPS Clinical Pharmacist, [place]  II Phone [phone]

## 2021-07-25 NOTE — Progress Notes (Signed)
Chronic Care Management Pharmacy Note  07/17/2021 Name:  [name] MRN:  [id number] DOB:  02-15-1951  Summary: t2dm  Recommendations/Changes made from today's visit: Diabetes: UNcontrolled; current treatment: TRESIBA 70-80 units daily, NOVOLOG 16-20 units with meals, metformin;  A1c INCREASED TO 8% Consider GLP1/sglt2  Follows with endocrine Admits to only taking AM dose of metformin--encouraged patient to take as prescribed Current glucose readings: fasting glucose: <170, post prandial glucose: 150-200s Uses libre CGM/[facility] via [employer] portal Denies hypoglycemic/hyperglycemic symptoms Discussed meal planning options and Plate method for healthy eating Avoid sugary drinks and desserts Incorporate balanced protein, non starchy veggies, 1 serving of carbohydrate with each meal Increase water intake Increase physical activity as able Current exercise: n/a Assessed patient finances. Re-enrollment submitted to novo nordisk patient assistance program for 2023--patient may not qualify since he has 75% LIS now; if approved, medications will ship to PCP office Education provided on diet and lifestyle modifications   Patient Goals/Self-Care Activities Over the next 90 days, patient will:  - take medications as prescribed check glucose 3 times daily or if symptomatic, document, and provide at future appointments  Follow Up Plan: Telephone follow up appointment with care management team member scheduled for: 3 months   Subjective: [name] is an 70 y.o. year old male who is a primary patient of [name], DO.  The CCM team was consulted for assistance with disease management and care coordination needs.    Engaged with patient face to face for follow up visit in response to provider referral for pharmacy case management and/or care coordination services.   Consent to Services:  The patient was given information about Chronic Care Management services,  agreed to services, and gave verbal consent prior to initiation of services.  Please see initial visit note for detailed documentation.   Patient Care Team: [name], DO as PCP - General (Family Medicine) Lavera Guise, [facility] (Pharmacist)   Objective:  Lab Results  Component Value Date   CREATININE 0.93 06/11/2021   CREATININE 1.18 10/30/2020   CREATININE 0.97 04/13/2020    Lab Results  Component Value Date   HGBA1C 8.0 (H) 06/11/2021   Last diabetic Eye exam:  Lab Results  Component Value Date/Time   HMDIABEYEEXA Retinopathy (A) 04/20/2019 12:00 AM    Last diabetic Foot exam: No results found for: HMDIABFOOTEX      Component Value Date/Time   CHOL 103 06/11/2021 0958   TRIG 98 06/11/2021 0958   HDL 27 (L) 06/11/2021 0958   CHOLHDL 3.8 06/11/2021 0958   LDLCALC 57 06/11/2021 0958    Hepatic Function Latest Ref Rng & Units 06/11/2021 10/30/2020 04/13/2020  Total Protein 6.0 - 8.5 g/dL 7.4 7.7 7.5  Albumin 3.8 - 4.8 g/dL 4.1 4.1 4.1  AST 0 - 40 IU/L 34 26 15  ALT 0 - 44 IU/L 32 20 23  Alk Phosphatase 44 - 121 IU/L 141(H) 102 67  Total Bilirubin 0.0 - 1.2 mg/dL 0.7 0.3 0.3    Lab Results  Component Value Date/Time   TSH 0.280 (L) 07/05/2019 09:19 AM   TSH 0.124 (L) 04/28/2019 08:19 AM    CBC Latest Ref Rng & Units 04/13/2020 01/11/2020 10/08/2019  WBC 3.4 - 10.8 x10E3/uL 8.0 6.5 7.9  Hemoglobin 13.0 - 17.7 g/dL 14.0 13.0 13.6  Hematocrit 37.5 - 51.0 % 44.0 40.5 44.6  Platelets 150 - 450 x10E3/uL 259 244 248    Lab Results  Component Value Date/Time  VD25OH 49.1 04/06/2019 10:36 AM    Clinical ASCVD: No  The ASCVD Risk score (Arnett DK, et al., 2019) failed to calculate for the following reasons:   The valid total cholesterol range is 130 to 320 mg/dL    Other: (CHADS2VASc if Afib, PHQ9 if depression, MMRC or CAT for COPD, ACT, DEXA)  Social History   Tobacco Use  Smoking Status Former   Packs/day: 0.50   Years: 41.00   Pack years: 20.50    Types: Cigarettes   Quit date: 02/29/2012   Years since quitting: 9.4  Smokeless Tobacco Never   BP Readings from Last 3 Encounters:  06/11/21 139/80  04/10/21 128/70  03/02/21 (!) 152/81   Pulse Readings from Last 3 Encounters:  06/11/21 88  04/10/21 83  03/02/21 81   Wt Readings from Last 3 Encounters:  06/11/21 299 lb (135.6 kg)  04/10/21 (!) 300 lb 12.8 oz (136.4 kg)  03/02/21 296 lb 6.4 oz (134.4 kg)    Assessment: Review of patient past medical history, allergies, medications, health status, including review of consultants reports, laboratory and other test data, was performed as part of comprehensive evaluation and provision of chronic care management services.   SDOH:  (Social Determinants of Health) assessments and interventions performed:    CCM Care Plan  No Known Allergies  Medications Reviewed Today     Reviewed by Lavera Guise, [facility] (Pharmacist) on 07/25/21 at [place] List Status: <None>   Medication Order Taking? Sig Documenting Provider Last Dose Status Informant  ALPRAZolam (XANAX) 1 MG tablet [id number]  Take 0.5-1 tablets (0.5-1 mg total) by mouth 3 (three) times daily as needed for anxiety (take ONLY IF NEEDED). Ronnie Doss M, DO  Active   amLODipine (NORVASC) 5 MG tablet [id number]  Take 1 tablet (5 mg total) by mouth daily. Ronnie Doss M, DO  Active   benzonatate (TESSALON PERLES) 100 MG capsule [id number]  Take 1 capsule (100 mg total) by mouth 3 (three) times daily as needed for cough. Ronnie Doss M, DO  Active   Continuous Blood Gluc Sensor (FREESTYLE LIBRE 14 DAY SENSOR) [place] [id number]  Scan to check glucose at least 4 times daily. DX: E11.4 [name], DO  Active   famotidine (PEPCID) 20 MG tablet [id number] No TAKE 1 TABLET BY MOUTH TWICE A DAY Ronnie Doss M, DO Taking Active   glucose blood (ONETOUCH VERIO) test strip [id number] No Use as instructed to monitor glucose 4 times daily (use as back up method for CGM)  Brita Romp, NP Taking Active   insulin aspart (NOVOLOG FLEXPEN) 100 UNIT/ML FlexPen [id number] No Inject 10-16 Units into the skin 3 (three) times daily with meals. [provider] Taking Active            Med Note Blanca Friend, Royce Macadamia   Tue Jun 05, 2021  2:59 PM) Via novo nordisk patient assistance program  insulin degludec (TRESIBA FLEXTOUCH) 200 UNIT/ML FlexTouch Pen [id number] No Inject 60 Units into the skin in the morning. Brita Romp, NP Taking Active            Med Note Gilmer Mor Jun 05, 2021  2:56 PM) Via novo nordisk patient assistance program  Insulin Pen Needle (B-D ULTRAFINE III SHORT PEN) 31G X 8 MM MISC [id number] No Use to give insulin daily Dx E11.9 Baruch Gouty, FNP Taking Active   Lancets [facility] ULTRASOFT) lancets [id number] No Use as instructed to monitor glucose 4  times daily (use as back up method for CGM). Brita Romp, NP Taking Active   lisinopril (ZESTRIL) 40 MG tablet [id number]  Take 1 tablet (40 mg total) by mouth daily. Ronnie Doss M, DO  Active   metFORMIN (GLUCOPHAGE-XR) 500 MG 24 hr tablet [id number] No TAKE 2 TABLETS BY MOUTH TWICE A DAY WITH FOOD Ronnie Doss M, DO Taking Active            Med Note Blanca Friend, Royce Macadamia   Thu Jun 21, 2021  3:26 PM) TAKING AM ONLY  mirtazapine (REMERON) 45 MG tablet [id number]  Take 1 tablet (45 mg total) by mouth at bedtime. Ronnie Doss M, DO  Active   omeprazole (PRILOSEC) 20 MG capsule [id number] No Take by mouth. [provider] Taking Active   rosuvastatin (CRESTOR) 20 MG tablet [id number]  TAKE 1 TABLET BY MOUTH EVERYDAY AT BEDTIME Ronnie Doss M, DO  Active   sildenafil (VIAGRA) 50 MG tablet [id number] No Take 1 tablet (50 mg total) by mouth daily as needed for erectile dysfunction. [name], DO Taking Active   Med List Note Loetta Rough 03/31/13 1303): Lisinopril (strength)             Patient Active Problem List   Diagnosis Date Noted    Proliferative diabetic retinopathy of left eye with macular edema associated with type 2 diabetes mellitus ([place]) 11/09/2019   Diabetic macular edema of right eye with proliferative retinopathy associated with type 2 diabetes mellitus ([place]) 11/09/2019   Optic atrophy associated with retinal dystrophies 11/09/2019   Primary open angle glaucoma of both eyes, severe stage 11/09/2019   Low TSH level 07/07/2019   Diabetic retinopathy of both eyes ([place]) 07/07/2019   Constipation due to pain medication 04/06/2019   Morbid obesity ([place]) 04/06/2019   Controlled substance agreement signed 04/06/2019   Corneal edema 12/16/2018   Type 2 diabetes mellitus with stable proliferative retinopathy of both eyes, with long-term current use of insulin ([place]) 10/12/2018   Primary open angle glaucoma (POAG) of right eye, severe stage 09/15/2017   Coronary artery disease due to lipid rich plaque 10/28/2016   Gastroesophageal reflux disease without esophagitis 01/30/2016   Chronic bilateral low back pain without sciatica 01/03/2015   Hypertension associated with diabetes ([place]) 01/03/2015   Primary osteoarthritis involving multiple joints 01/03/2015   Vitamin D deficiency 01/03/2015   GAD (generalized anxiety disorder) 01/03/2015   Erectile dysfunction associated with type 2 diabetes mellitus ([place]) 12/21/2013    Immunization History  Administered Date(s) Administered   Fluad Quad(high Dose 65+) 04/30/2017, 04/13/2019   Influenza Split 04/19/2014, 05/29/2020   Influenza-Unspecified 03/19/2016, 04/22/2018, 05/24/2020   Moderna SARS-COV2 Booster Vaccination 12/13/2020   Moderna Sars-Covid-2 Vaccination 09/30/2019, 10/29/2019, 06/24/2020, 12/13/2020   Pneumococcal Conjugate-13 04/23/2018   Pneumococcal Polysaccharide-23 12/28/2015   Zoster, Live 01/04/2016    Conditions to be addressed/monitored: HTN and DMII  Care Plan : PHARMD MEDICATION MANAGEMENT  Updates made by Lavera Guise, [place] since 07/25/2021  12:00 AM     Problem: DISEASE PROGRESSION PREVENTION      Long-Range Goal: T2DM   Recent Progress: Not on track  Priority: High  Note:   Current Barriers:  Unable to independently afford treatment regimen Unable to maintain control of T2DM  Pharmacist Clinical Goal(s):  Over the next 90 days, patient will verbalize ability to afford treatment regimen maintain control of T2DM as evidenced by [place]  through collaboration with PharmD and provider.    Interventions:  1:1 collaboration with [name], DO regarding development and update of comprehensive plan of care as evidenced by provider attestation and co-signature Inter-disciplinary care team collaboration (see longitudinal plan of care) Comprehensive medication review performed; medication list updated in electronic medical record  Diabetes: UNcontrolled; current treatment: TRESIBA 70-80 units daily, NOVOLOG 16-20 units with meals, metformin;  A1c INCREASED TO 8% Consider GLP1/sglt2  Follows with endocrine Admits to only taking AM dose of metformin--encouraged patient to take as prescribed Current glucose readings: fasting glucose: <170, post prandial glucose: 150-200s Uses libre CGM/[facility] via [employer] portal Denies hypoglycemic/hyperglycemic symptoms Discussed meal planning options and Plate method for healthy eating Avoid sugary drinks and desserts Incorporate balanced protein, non starchy veggies, 1 serving of carbohydrate with each meal Increase water intake Increase physical activity as able Current exercise: n/a Assessed patient finances. Re-enrollment submitted to novo nordisk patient assistance program for 2023--patient may not qualify since he has 75% LIS now; if approved, medications will ship to PCP office Education provided on diet and lifestyle modifications   Patient Goals/Self-Care Activities Over the next 90 days, patient will:  - take medications as  prescribed check glucose 3 times daily or if symptomatic, document, and provide at future appointments  Follow Up Plan: Telephone follow up appointment with care management team member scheduled for: 3 months      Medication Assistance: Application for novo nordisk/tresiba/novolog  medication assistance program. in process.  Anticipated assistance start date TBD.  See plan of care for additional detail.  Patient's preferred pharmacy is:  [id number] [address]Phone: [phone]: [phone] [address]Phone: [phone]: [phone] [address]Phone: [phone]: [address] [address]Phone: [phone]: [phone]  Follow Up:  Patient agrees to Care Plan and Follow-up.  Plan: Telephone follow up appointment with care management team member scheduled for:  2 MONTHS   JRegina Eck PharmD, BCPS Clinical Pharmacist, [place] Phone [phone]

## 2021-07-27 ENCOUNTER — Other Ambulatory Visit: Payer: Self-pay | Admitting: Family Medicine

## 2021-07-27 DIAGNOSIS — K5903 Drug induced constipation: Secondary | ICD-10-CM

## 2021-08-02 ENCOUNTER — Ambulatory Visit: Payer: Medicare Other | Admitting: Pharmacist

## 2021-08-02 DIAGNOSIS — Z794 Long term (current) use of insulin: Secondary | ICD-10-CM

## 2021-08-02 DIAGNOSIS — E1159 Type 2 diabetes mellitus with other circulatory complications: Secondary | ICD-10-CM

## 2021-08-02 NOTE — Progress Notes (Signed)
Chronic Care Management Pharmacy Note  08/02/2021 Name:  [name] MRN:  [id number] DOB:  1951-06-25  Summary: t2dm  Recommendations/Changes made from today's visit: Diabetes: UNcontrolled; current treatment: TRESIBA 70-80 units daily, NOVOLOG 16-20 units with meals, metformin;  A1c INCREASED TO 8% Consider GLP1/sglt2  Follows with endocrine Admits to only taking AM dose of metformin--encouraged patient to take as prescribed; strict with meal time insulin (needing more around the holidays) Endocrine follow up next month Current glucose readings: fasting glucose: <170, post prandial glucose: 150-200s Uses libre CGM/[facility] via [employer] portal Denies hypoglycemic/hyperglycemic symptoms Discussed meal planning options and Plate method for healthy eating Avoid sugary drinks and desserts Incorporate balanced protein, non starchy veggies, 1 serving of carbohydrate with each meal Increase water intake Increase physical activity as able Current exercise: n/a Assessed patient finances. Re-enrollment submitted to novo nordisk patient assistance program for 2023--patient may not qualify since he has 75% LIS now; if approved, medications will ship to PCP office; tresiba/novolog patient assistance supply shipped today--patient picked up Education provided on diet and lifestyle modifications   Patient Goals/Self-Care Activities Over the next 90 days, patient will:  - take medications as prescribed check glucose 3 times daily or if symptomatic, document, and provide at future appointments  Follow Up Plan: Telephone follow up appointment with care management team member scheduled for: 3 months  Subjective: [name] is an 70 y.o. year old male who is a primary patient of [name], DO.  The CCM team was consulted for assistance with disease management and care coordination needs.    Engaged with patient by telephone for follow up visit in response to provider  referral for pharmacy case management and/or care coordination services.   Consent to Services:  The patient was given information about Chronic Care Management services, agreed to services, and gave verbal consent prior to initiation of services.  Please see initial visit note for detailed documentation.   Patient Care Team: [name], DO as PCP - General (Family Medicine) Lavera Guise, [facility] (Pharmacist)    Objective:  Lab Results  Component Value Date   CREATININE 0.93 06/11/2021   CREATININE 1.18 10/30/2020   CREATININE 0.97 04/13/2020    Lab Results  Component Value Date   HGBA1C 8.0 (H) 06/11/2021   Last diabetic Eye exam:  Lab Results  Component Value Date/Time   HMDIABEYEEXA Retinopathy (A) 04/20/2019 12:00 AM    Last diabetic Foot exam: No results found for: HMDIABFOOTEX      Component Value Date/Time   CHOL 103 06/11/2021 0958   TRIG 98 06/11/2021 0958   HDL 27 (L) 06/11/2021 0958   CHOLHDL 3.8 06/11/2021 0958   LDLCALC 57 06/11/2021 0958    Hepatic Function Latest Ref Rng & Units 06/11/2021 10/30/2020 04/13/2020  Total Protein 6.0 - 8.5 g/dL 7.4 7.7 7.5  Albumin 3.8 - 4.8 g/dL 4.1 4.1 4.1  AST 0 - 40 IU/L 34 26 15  ALT 0 - 44 IU/L 32 20 23  Alk Phosphatase 44 - 121 IU/L 141(H) 102 67  Total Bilirubin 0.0 - 1.2 mg/dL 0.7 0.3 0.3    Lab Results  Component Value Date/Time   TSH 0.280 (L) 07/05/2019 09:19 AM   TSH 0.124 (L) 04/28/2019 08:19 AM    CBC Latest Ref Rng & Units 04/13/2020 01/11/2020 10/08/2019  WBC 3.4 - 10.8 x10E3/uL 8.0 6.5 7.9  Hemoglobin 13.0 - 17.7 g/dL 14.0 13.0 13.6  Hematocrit 37.5 - 51.0 % 44.0  40.5 44.6  Platelets 150 - 450 x10E3/uL 259 244 248    Lab Results  Component Value Date/Time   VD25OH 49.1 04/06/2019 10:36 AM    Clinical ASCVD: No  The ASCVD Risk score (Arnett DK, et al., 2019) failed to calculate for the following reasons:   The valid total cholesterol range is 130 to 320 mg/dL    Other: (CHADS2VASc if  Afib, PHQ9 if depression, MMRC or CAT for COPD, ACT, DEXA)  Social History   Tobacco Use  Smoking Status Former   Packs/day: 0.50   Years: 41.00   Pack years: 20.50   Types: Cigarettes   Quit date: 02/29/2012   Years since quitting: 9.4  Smokeless Tobacco Never   BP Readings from Last 3 Encounters:  06/11/21 139/80  04/10/21 128/70  03/02/21 (!) 152/81   Pulse Readings from Last 3 Encounters:  06/11/21 88  04/10/21 83  03/02/21 81   Wt Readings from Last 3 Encounters:  06/11/21 299 lb (135.6 kg)  04/10/21 (!) 300 lb 12.8 oz (136.4 kg)  03/02/21 296 lb 6.4 oz (134.4 kg)    Assessment: Review of patient past medical history, allergies, medications, health status, including review of consultants reports, laboratory and other test data, was performed as part of comprehensive evaluation and provision of chronic care management services.   SDOH:  (Social Determinants of Health) assessments and interventions performed:    CCM Care Plan  No Known Allergies  Medications Reviewed Today     Reviewed by Lavera Guise, [facility] (Pharmacist) on 08/02/21 at [place] List Status: <None>   Medication Order Taking? Sig Documenting Provider Last Dose Status Informant  ALPRAZolam (XANAX) 1 MG tablet [id number]  Take 0.5-1 tablets (0.5-1 mg total) by mouth 3 (three) times daily as needed for anxiety (take ONLY IF NEEDED). Ronnie Doss M, DO  Active   amLODipine (NORVASC) 5 MG tablet [id number]  Take 1 tablet (5 mg total) by mouth daily. Ronnie Doss M, DO  Active   benzonatate (TESSALON PERLES) 100 MG capsule [id number]  Take 1 capsule (100 mg total) by mouth 3 (three) times daily as needed for cough. Ronnie Doss M, DO  Active   Continuous Blood Gluc Sensor (FREESTYLE LIBRE 14 DAY SENSOR) [place] [id number]  Scan to check glucose at least 4 times daily. DX: E11.4 [name], DO  Active   famotidine (PEPCID) 20 MG tablet [id number] No TAKE 1 TABLET BY MOUTH TWICE A DAY  Ronnie Doss M, DO Taking Active   glucose blood (ONETOUCH VERIO) test strip [id number] No Use as instructed to monitor glucose 4 times daily (use as back up method for CGM) Brita Romp, NP Taking Active   insulin aspart (NOVOLOG FLEXPEN) 100 UNIT/ML FlexPen [id number] No Inject 10-16 Units into the skin 3 (three) times daily with meals. [provider] Taking Active            Med Note Blanca Friend, Royce Macadamia   Tue Jun 05, 2021  2:59 PM) Via novo nordisk patient assistance program  insulin degludec (TRESIBA FLEXTOUCH) 200 UNIT/ML FlexTouch Pen [id number] No Inject 60 Units into the skin in the morning. Brita Romp, NP Taking Active            Med Note Lavera Guise   Tue Jun 05, 2021  2:56 PM) Via novo nordisk patient assistance program  Insulin Pen Needle (B-D ULTRAFINE III SHORT PEN) 31G X 8 MM MISC [id number] No Use to give insulin daily Dx  E11.9 Baruch Gouty, FNP Taking Active   Lancets [facility] ULTRASOFT) lancets [id number] No Use as instructed to monitor glucose 4 times daily (use as back up method for CGM). Brita Romp, NP Taking Active   lisinopril (ZESTRIL) 40 MG tablet [id number]  Take 1 tablet (40 mg total) by mouth daily. Ronnie Doss M, DO  Active   metFORMIN (GLUCOPHAGE-XR) 500 MG 24 hr tablet [id number] No TAKE 2 TABLETS BY MOUTH TWICE A DAY WITH FOOD Ronnie Doss M, DO Taking Active            Med Note Blanca Friend, Royce Macadamia   Thu Jun 21, 2021  3:26 PM) TAKING AM ONLY  mirtazapine (REMERON) 45 MG tablet [id number]  Take 1 tablet (45 mg total) by mouth at bedtime. Ronnie Doss M, DO  Active   omeprazole (PRILOSEC) 20 MG capsule [id number] No Take by mouth. [provider] Taking Active   rosuvastatin (CRESTOR) 20 MG tablet [id number]  TAKE 1 TABLET BY MOUTH EVERYDAY AT BEDTIME Ronnie Doss M, DO  Active   sildenafil (VIAGRA) 50 MG tablet [id number] No Take 1 tablet (50 mg total) by mouth daily as needed for erectile dysfunction. [name], DO Taking Active   Med List Note Loetta Rough 03/31/13 1303): Lisinopril (strength)             Patient Active Problem List   Diagnosis Date Noted   Proliferative diabetic retinopathy of left eye with macular edema associated with type 2 diabetes mellitus ([place]) 11/09/2019   Diabetic macular edema of right eye with proliferative retinopathy associated with type 2 diabetes mellitus ([place]) 11/09/2019   Optic atrophy associated with retinal dystrophies 11/09/2019   Primary open angle glaucoma of both eyes, severe stage 11/09/2019   Low TSH level 07/07/2019   Diabetic retinopathy of both eyes ([place]) 07/07/2019   Constipation due to pain medication 04/06/2019   Morbid obesity ([place]) 04/06/2019   Controlled substance agreement signed 04/06/2019   Corneal edema 12/16/2018   Type 2 diabetes mellitus with stable proliferative retinopathy of both eyes, with long-term current use of insulin ([place]) 10/12/2018   Primary open angle glaucoma (POAG) of right eye, severe stage 09/15/2017   Coronary artery disease due to lipid rich plaque 10/28/2016   Gastroesophageal reflux disease without esophagitis 01/30/2016   Chronic bilateral low back pain without sciatica 01/03/2015   Hypertension associated with diabetes ([place]) 01/03/2015   Primary osteoarthritis involving multiple joints 01/03/2015   Vitamin D deficiency 01/03/2015   GAD (generalized anxiety disorder) 01/03/2015   Erectile dysfunction associated with type 2 diabetes mellitus ([place]) 12/21/2013    Immunization History  Administered Date(s) Administered   Fluad Quad(high Dose 65+) 04/30/2017, 04/13/2019   Influenza Split 04/19/2014, 05/29/2020   Influenza-Unspecified 03/19/2016, 04/22/2018, 05/24/2020   Moderna SARS-COV2 Booster Vaccination 12/13/2020   Moderna Sars-Covid-2 Vaccination 09/30/2019, 10/29/2019, 06/24/2020, 12/13/2020   Pneumococcal Conjugate-13 04/23/2018   Pneumococcal Polysaccharide-23 12/28/2015    Zoster, Live 01/04/2016    Conditions to be addressed/monitored: HTN, HLD, and DMII  Care Plan : PHARMD MEDICATION MANAGEMENT  Updates made by Lavera Guise, [place] since 08/02/2021 12:00 AM     Problem: DISEASE PROGRESSION PREVENTION      Long-Range Goal: T2DM   Recent Progress: Not on track  Priority: High  Note:   Current Barriers:  Unable to independently afford treatment regimen Unable to maintain control of T2DM  Pharmacist Clinical Goal(s):  Over the next 90 days, patient will verbalize ability to afford  treatment regimen maintain control of T2DM as evidenced by [place]  through collaboration with PharmD and provider.    Interventions: 1:1 collaboration with [name], DO regarding development and update of comprehensive plan of care as evidenced by provider attestation and co-signature Inter-disciplinary care team collaboration (see longitudinal plan of care) Comprehensive medication review performed; medication list updated in electronic medical record  Diabetes: UNcontrolled; current treatment: TRESIBA 70-80 units daily, NOVOLOG 16-20 units with meals, metformin;  A1c INCREASED TO 8% Consider GLP1/sglt2  Follows with endocrine Admits to only taking AM dose of metformin--encouraged patient to take as prescribed; strict with meal time insulin (needing more around the holidays) Endocrine follow up next month Current glucose readings: fasting glucose: <170, post prandial glucose: 150-200s Uses libre CGM/[facility] via parachute portal Denies hypoglycemic/hyperglycemic symptoms Discussed meal planning options and Plate method for healthy eating Avoid sugary drinks and desserts Incorporate balanced protein, non starchy veggies, 1 serving of carbohydrate with each meal Increase water intake Increase physical activity as able Current exercise: n/a Assessed patient finances. Re-enrollment submitted to novo nordisk patient assistance  program for 2023--patient may not qualify since he has 75% LIS now; if approved, medications will ship to PCP office; tresiba/novolog patient assistance supply shipped today--patient picked up Education provided on diet and lifestyle modifications   Patient Goals/Self-Care Activities Over the next 90 days, patient will:  - take medications as prescribed check glucose 3 times daily or if symptomatic, document, and provide at future appointments  Follow Up Plan: Telephone follow up appointment with care management team member scheduled for: 3 months      Medication Assistance: Application for novo nordisk  medication assistance program. in process.  Anticipated assistance start date tbd for 2023.  See plan of care for additional detail.  Patient's preferred pharmacy is:  [id number] [place]Phone: [phone]: [phone] [place]Phone: [phone]: [phone] [place]Phone: [phone]: [address]Phone: [phone]: [phone]  Follow Up:  Patient agrees to Care Plan and Follow-up.  Plan: Telephone follow up appointment with care management team member scheduled for:  3 months   JRegina Eck PharmD, BCPS Clinical Pharmacist, [place] Phone [phone]

## 2021-08-02 NOTE — Patient Instructions (Signed)
Visit Information  Thank you for taking time to visit with me today. Please don't hesitate to contact me if I can be of assistance to you before our next scheduled telephone appointment.  Following are the goals we discussed today:  Current Barriers:  Unable to independently afford treatment regimen Unable to maintain control of T2DM  Pharmacist Clinical Goal(s):  Over the next 90 days, patient will verbalize ability to afford treatment regimen maintain control of T2DM as evidenced by [place]  through collaboration with PharmD and provider.    Interventions: 1:1 collaboration with [name], DO regarding development and update of comprehensive plan of care as evidenced by provider attestation and co-signature Inter-disciplinary care team collaboration (see longitudinal plan of care) Comprehensive medication review performed; medication list updated in electronic medical record  Diabetes: UNcontrolled; current treatment: TRESIBA 70-80 units daily, NOVOLOG 16-20 units with meals, metformin;  A1c INCREASED TO 8% Consider GLP1/sglt2  Follows with endocrine Admits to only taking AM dose of metformin--encouraged patient to take as prescribed; strict with meal time insulin (needing more around the holidays) Endocrine follow up next month Current glucose readings: fasting glucose: <170, post prandial glucose: 150-200s Uses libre CGM/[facility] [facility] via parachute portal Denies hypoglycemic/hyperglycemic symptoms Discussed meal planning options and Plate method for healthy eating Avoid sugary drinks and desserts Incorporate balanced protein, non starchy veggies, 1 serving of carbohydrate with each meal Increase water intake Increase physical activity as able Current exercise: n/a Assessed patient finances. Re-enrollment submitted to novo nordisk patient assistance program for 2023--patient may not qualify since he has 75% LIS now; if approved, medications will  ship to PCP office; tresiba/novolog patient assistance supply shipped today--patient picked up Education provided on diet and lifestyle modifications   Patient Goals/Self-Care Activities Over the next 90 days, patient will:  - take medications as prescribed check glucose 3 times daily or if symptomatic, document, and provide at future appointments  Follow Up Plan: Telephone follow up appointment with care management team member scheduled for: 3 months   Please call the care guide team at [phone] if you need to cancel or reschedule your appointment.    The patient verbalized understanding of instructions, educational materials, and care plan provided today and declined offer to receive copy of patient instructions, educational materials, and care plan.   Signature Regina Eck, PharmD, BCPS Clinical Pharmacist, [place]  II Phone [phone]

## 2021-08-04 DIAGNOSIS — E1159 Type 2 diabetes mellitus with other circulatory complications: Secondary | ICD-10-CM

## 2021-08-04 DIAGNOSIS — Z794 Long term (current) use of insulin: Secondary | ICD-10-CM

## 2021-08-04 DIAGNOSIS — E113553 Type 2 diabetes mellitus with stable proliferative diabetic retinopathy, bilateral: Secondary | ICD-10-CM

## 2021-08-04 DIAGNOSIS — I152 Hypertension secondary to endocrine disorders: Secondary | ICD-10-CM

## 2021-08-13 ENCOUNTER — Encounter: Payer: Self-pay | Admitting: Nurse Practitioner

## 2021-08-13 ENCOUNTER — Ambulatory Visit: Payer: Medicare Other | Admitting: Nurse Practitioner

## 2021-08-13 ENCOUNTER — Other Ambulatory Visit: Payer: Self-pay

## 2021-08-13 VITALS — BP 137/76 | HR 78 | Ht 73.0 in | Wt 307.6 lb

## 2021-08-13 DIAGNOSIS — I10 Essential (primary) hypertension: Secondary | ICD-10-CM | POA: Diagnosis not present

## 2021-08-13 DIAGNOSIS — E1122 Type 2 diabetes mellitus with diabetic chronic kidney disease: Secondary | ICD-10-CM

## 2021-08-13 DIAGNOSIS — E782 Mixed hyperlipidemia: Secondary | ICD-10-CM | POA: Diagnosis not present

## 2021-08-13 DIAGNOSIS — N182 Chronic kidney disease, stage 2 (mild): Secondary | ICD-10-CM | POA: Diagnosis not present

## 2021-08-13 DIAGNOSIS — Z794 Long term (current) use of insulin: Secondary | ICD-10-CM | POA: Diagnosis not present

## 2021-08-13 NOTE — Progress Notes (Signed)
Endocrinology Follow Up Note       08/13/2021, 11:04 AM   Subjective:    Patient ID: [name], male    DOB: 18-Nov-1950.  [name] is being seen in follow up after being seen in consultation for management of currently uncontrolled symptomatic diabetes requested by  [name], DO.   Past Medical History:  Diagnosis Date   Anxiety    Arthritis    Diabetes mellitus without complication (HCC)    GERD (gastroesophageal reflux disease)    Hypercholesteremia    Hypertension     Past Surgical History:  Procedure Laterality Date   boils     removed form back of skull   CATARACT EXTRACTION W/PHACO Right 04/02/2013   Procedure: CATARACT EXTRACTION PHACO AND INTRAOCULAR LENS PLACEMENT ([place]);  Surgeon: Williams Che, MD;  Location: [facility];  Service: Ophthalmology;  Laterality: Right;  CDE 7.00   CATARACT EXTRACTION W/PHACO Left 10/04/2013   Procedure: CATARACT EXTRACTION PHACO AND INTRAOCULAR LENS PLACEMENT ([facility]);  Surgeon: Williams Che, MD;  Location: [facility];  Service: Ophthalmology;  Laterality: Left;  CDE:1.46   COLONOSCOPY N/A 07/03/2015   Procedure: COLONOSCOPY;  Surgeon: Rogene Houston, MD;  Location: [facility];  Service: Endoscopy;  Laterality: N/A;  830   EYE SURGERY Right    "valve replaced and stent placed" per pt to reroute the fluid in his eye   ROTATOR CUFF REPAIR Right     Social History   Socioeconomic History   Marital status: Divorced    Spouse name: Not on file   Number of children: Not on file   Years of education: Not on file   Highest education level: Not on file  Occupational History   Not on file  Tobacco Use   Smoking status: Former    Packs/day: 0.50    Years: 41.00    Pack years: 20.50    Types: Cigarettes    Quit date: 02/29/2012    Years since quitting: 9.4   Smokeless tobacco: Never  Vaping Use   Vaping Use: Never used  Substance and Sexual Activity    Alcohol use: No    Comment: quit 2012   Drug use: No    Comment: quit in 2012   Sexual activity: Yes    Birth control/protection: None  Other Topics Concern   Not on file  Social History Narrative   Not on file   Social Determinants of Health   Financial Resource Strain: Not on file  Food Insecurity: Not on file  Transportation Needs: Not on file  Physical Activity: Not on file  Stress: Not on file  Social Connections: Not on file    Family History  Problem Relation Age of Onset   Diabetes Mother    Glaucoma Mother     Outpatient Encounter Medications as of 08/13/2021  Medication Sig   ALPRAZolam (XANAX) 1 MG tablet Take 0.5-1 tablets (0.5-1 mg total) by mouth 3 (three) times daily as needed for anxiety (take ONLY IF NEEDED).   amLODipine (NORVASC) 5 MG tablet Take 1 tablet (5 mg total) by mouth daily.   Continuous Blood Gluc Sensor (FREESTYLE LIBRE 14 DAY  SENSOR) MISC Scan to check glucose at least 4 times daily. DX: E11.4   famotidine (PEPCID) 20 MG tablet TAKE 1 TABLET BY MOUTH TWICE A DAY   glucose blood (ONETOUCH VERIO) test strip Use as instructed to monitor glucose 4 times daily (use as back up method for CGM)   insulin aspart (NOVOLOG FLEXPEN) 100 UNIT/ML FlexPen Inject 10-16 Units into the skin 3 (three) times daily with meals.   insulin degludec (TRESIBA FLEXTOUCH) 200 UNIT/ML FlexTouch Pen Inject 60 Units into the skin in the morning.   Insulin Pen Needle (B-D ULTRAFINE III SHORT PEN) 31G X 8 MM MISC Use to give insulin daily Dx E11.9   Lancets (ONETOUCH ULTRASOFT) lancets Use as instructed to monitor glucose 4 times daily (use as back up method for CGM).   lisinopril (ZESTRIL) 40 MG tablet Take 1 tablet (40 mg total) by mouth daily.   metFORMIN (GLUCOPHAGE-XR) 500 MG 24 hr tablet TAKE 2 TABLETS BY MOUTH TWICE A DAY WITH FOOD   mirtazapine (REMERON) 45 MG tablet Take 1 tablet (45 mg total) by mouth at bedtime.   omeprazole (PRILOSEC) 20 MG capsule Take by mouth.    rosuvastatin (CRESTOR) 20 MG tablet TAKE 1 TABLET BY MOUTH EVERYDAY AT BEDTIME   sildenafil (VIAGRA) 50 MG tablet Take 1 tablet (50 mg total) by mouth daily as needed for erectile dysfunction.   [DISCONTINUED] benzonatate (TESSALON PERLES) 100 MG capsule Take 1 capsule (100 mg total) by mouth 3 (three) times daily as needed for cough. (Patient not taking: Reported on 08/13/2021)   [DISCONTINUED] lactulose (CHRONULAC) 10 GM/15ML solution SMARTSIG:30 Milliliter(s) By Mouth Daily PRN (Patient not taking: Reported on 08/13/2021)   No facility-administered encounter medications on file as of 08/13/2021.    ALLERGIES: No Known Allergies  VACCINATION STATUS: Immunization History  Administered Date(s) Administered   Fluad Quad(high Dose 65+) 04/30/2017, 04/13/2019   Influenza Split 04/19/2014, 05/29/2020   Influenza-Unspecified 03/19/2016, 04/22/2018, 05/24/2020   Moderna SARS-COV2 Booster Vaccination 12/13/2020   Moderna Sars-Covid-2 Vaccination 09/30/2019, 10/29/2019, 06/24/2020, 12/13/2020   Pneumococcal Conjugate-13 04/23/2018   Pneumococcal Polysaccharide-23 12/28/2015   Zoster, Live 01/04/2016    Diabetes He presents for his follow-up diabetic visit. He has type 2 diabetes mellitus. Onset time: diagnosed at approx age of 50. His disease course has been fluctuating. There are no hypoglycemic associated symptoms. Associated symptoms include foot paresthesias. Pertinent negatives for diabetes include no blurred vision, no fatigue, no polydipsia, no polyuria, no visual change, no weakness and no weight loss. There are no hypoglycemic complications. Symptoms are improving. Diabetic complications include heart disease, impotence, nephropathy and retinopathy. Risk factors for coronary artery disease include diabetes mellitus, dyslipidemia, family history, hypertension, male sex, obesity, sedentary lifestyle and tobacco exposure. Current diabetic treatment includes intensive insulin program and oral  agent (monotherapy). He is compliant with treatment most of the time. His weight is decreasing steadily. He is following a generally healthy diet. When asked about meal planning, he reported none. He has not had a previous visit with a dietitian. He rarely participates in exercise. His home blood glucose trend is fluctuating minimally. (He presents today with his CGM, no logs, showing slightly above target fasting and postprandial glycemic profile.  His previsit A1c on 06/11/21 was 8%, increasing slightly from last visit of 7.1%.  He does admit he has not eaten as healthy as he should during the holiday season.  Analysis of his CGM shows TIR 37%, TAR 63%, TBR 0% with a GMI of 8.1%.) An ACE inhibitor/angiotensin  II receptor blocker is being taken. He does not see a podiatrist.Eye exam is current.  Hyperlipidemia This is a chronic problem. The current episode started more than 1 year ago. The problem is controlled. Recent lipid tests were reviewed and are normal. Exacerbating diseases include chronic renal disease, diabetes and obesity. Factors aggravating his hyperlipidemia include fatty foods. Current antihyperlipidemic treatment includes statins. The current treatment provides moderate improvement of lipids. Compliance problems include adherence to diet and adherence to exercise.  Risk factors for coronary artery disease include diabetes mellitus, dyslipidemia, family history, male sex, hypertension, obesity and a sedentary lifestyle.  Hypertension This is a chronic problem. The current episode started more than 1 year ago. The problem has been gradually improving since onset. The problem is controlled. Pertinent negatives include no blurred vision. There are no associated agents to hypertension. Risk factors for coronary artery disease include diabetes mellitus, dyslipidemia, family history, male gender, obesity, sedentary lifestyle and smoking/tobacco exposure. Past treatments include ACE inhibitors and  calcium channel blockers. The current treatment provides moderate improvement. There are no compliance problems.  Hypertensive end-organ damage includes kidney disease, CAD/MI and retinopathy. Identifiable causes of hypertension include chronic renal disease.   Review of systems  Constitutional: + fluctuating body weight,  current Body mass index is 40.58 kg/m., no fatigue, no subjective hyperthermia, no subjective hypothermia Eyes: + blurry vision (bilateral retinopathy with recent surgery on both eyes), legally blind in right eye, no xerophthalmia ENT: no sore throat, no nodules palpated in throat, no dysphagia/odynophagia, no hoarseness Cardiovascular: no chest pain, no shortness of breath, no palpitations, no leg swelling Respiratory: no cough, no shortness of breath Gastrointestinal: no nausea/vomiting/diarrhea Musculoskeletal: no muscle/joint aches, walks with cane for deconditioning Skin: no rashes, no hyperemia Neurological: no tremors, no numbness, no tingling, no dizziness Psychiatric: no depression, no anxiety   Objective:     BP 137/76    Pulse 78    Ht 6\' 1"  (1.854 m)    Wt (!) 307 lb 9.6 oz (139.5 kg)    SpO2 98%    BMI 40.58 kg/m   Wt Readings from Last 3 Encounters:  08/13/21 (!) 307 lb 9.6 oz (139.5 kg)  06/11/21 299 lb (135.6 kg)  04/10/21 (!) 300 lb 12.8 oz (136.4 kg)     BP Readings from Last 3 Encounters:  08/13/21 137/76  06/11/21 139/80  04/10/21 128/70     Physical Exam- Limited  Constitutional:  Body mass index is 40.58 kg/m. , not in acute distress, normal state of mind Eyes:  EOMI, no exophthalmos, right eye opaque conjunctiva Neck: Supple Cardiovascular: RRR, no murmurs, rubs, or gallops, no edema Respiratory: Adequate breathing efforts, no crackles, rales, rhonchi, or wheezing Musculoskeletal: no gross deformities, strength intact in all four extremities, no gross restriction of joint movements, walks with cane for deconditioning Skin:  no  rashes, no hyperemia Neurological: no tremor with outstretched hands    CMP ( most recent) CMP     Component Value Date/Time   NA 141 06/11/2021 0958   K 4.0 06/11/2021 0958   CL 104 06/11/2021 0958   CO2 24 06/11/2021 0958   GLUCOSE 184 (H) 06/11/2021 0958   GLUCOSE 149 (H) 02/09/2019 1340   BUN 12 06/11/2021 0958   CREATININE 0.93 06/11/2021 0958   CALCIUM 9.2 06/11/2021 0958   PROT 7.4 06/11/2021 0958   ALBUMIN 4.1 06/11/2021 0958   AST 34 06/11/2021 0958   ALT 32 06/11/2021 0958   ALKPHOS 141 (H) 06/11/2021 5643  BILITOT 0.7 06/11/2021 0958   GFRNONAA 79 04/13/2020 0906   GFRAA 92 04/13/2020 0906     Diabetic Labs (most recent): Lab Results  Component Value Date   HGBA1C 8.0 (H) 06/11/2021   HGBA1C 7.1 (H) 03/02/2021   HGBA1C 8.9 (H) 10/30/2020     Lipid Panel ( most recent) Lipid Panel     Component Value Date/Time   CHOL 103 06/11/2021 0958   TRIG 98 06/11/2021 0958   HDL 27 (L) 06/11/2021 0958   CHOLHDL 3.8 06/11/2021 0958   LDLCALC 57 06/11/2021 0958   LABVLDL 19 06/11/2021 0958      Lab Results  Component Value Date   TSH 0.280 (L) 07/05/2019   TSH 0.124 (L) 04/28/2019   TSH 0.059 (L) 04/06/2019           Assessment & Plan:   1) Uncontrolled Type 2 Diabetes with long-term use of insulin  - [name] has currently uncontrolled symptomatic type 2 DM since 71 years of age.   He presents today with his CGM, no logs, showing slightly above target fasting and postprandial glycemic profile.  His previsit A1c on 06/11/21 was 8%, increasing slightly from last visit of 7.1%.  He does admit he has not eaten as healthy as he should during the holiday season and still has a soda from time to time.  Analysis of his CGM shows TIR 37%, TAR 63%, TBR 0% with a GMI of 8.1%.    -Recent labs reviewed.  - I had a long discussion with him about the progressive nature of diabetes and the pathology behind its complications. -his diabetes is complicated by  CKD, CAD and he remains at a high risk for more acute and chronic complications which include CAD, CVA, CKD, retinopathy, and neuropathy. These are all discussed in detail with him.  - Nutritional counseling repeated at each appointment due to patients tendency to fall back in to old habits.  - The patient admits there is a room for improvement in their diet and drink choices. -  Suggestion is made for the patient to avoid simple carbohydrates from their diet including Cakes, Sweet Desserts / Pastries, Ice Cream, Soda (diet and regular), Sweet Tea, Candies, Chips, Cookies, Sweet Pastries, Store Bought Juices, Alcohol in Excess of 1-2 drinks a day, Artificial Sweeteners, Coffee Creamer, and "Sugar-free" Products. This will help patient to have stable blood glucose profile and potentially avoid unintended weight gain.   - I encouraged the patient to switch to unprocessed or minimally processed complex starch and increased protein intake (animal or plant source), fruits, and vegetables.   - Patient is advised to stick to a routine mealtimes to eat 3 meals a day and avoid unnecessary snacks (to snack only to correct hypoglycemia).  - I have approached him with the following individualized plan to manage  his diabetes and patient agrees:   -Based on his improving glycemic profile in recent days, no changes will be made to his medications. He is advised to continue Tresiba 60 units SQ nightly and continue his Novolog (recently switched due to insurance preference) to 10-16 units TID with meals if glucose is above 90 and he is eating (Specific instructions on how to titrate insulin dosage based on glucose readings given to patient in writing).  He can also continue his Metformin 500 mg ER twice daily with meals.    -he is encouraged to continue using his CGM to monitor glucose 4 times daily, before meals and before bed,  and to call the clinic if he has readings less than 70 or greater than 300 for 3 tests in  a row.  - he is warned not to take insulin without proper monitoring per orders. - Adjustment parameters are given to him for hypo and hyperglycemia in writing.  - his Vania Rea was be discontinued at last visit, risk outweighs benefit for this patient.  - he will be considered for incretin therapy as appropriate next visit.  - Specific targets for  A1c;  LDL, HDL,  and Triglycerides were discussed with the patient.  2) Blood Pressure /Hypertension:  his blood pressure is controlled to target for his age.   he is advised to continue his current medications including Norvasc 5 mg po daily and Lisinopril 40 mg p.o. daily with breakfast.  3) Lipids/Hyperlipidemia:    Review of his recent lipid panel from 06/11/21 showed controlled LDL at 57 .  he  is advised to continue Crestor 20 mg daily at bedtime.  Side effects and precautions discussed with him.    4)  Weight/Diet:  his Body mass index is 40.58 kg/m.  -  clearly complicating his diabetes care.   he is a candidate for weight loss. I discussed with him the fact that loss of 5 - 10% of his  current body weight will have the most impact on his diabetes management.  Exercise, and detailed carbohydrates information provided  -  detailed on discharge instructions.  5) Chronic Care/Health Maintenance: -he is on ACEI/ARB and Statin medications and is encouraged to initiate and continue to follow up with Ophthalmology, Dentist, Podiatrist at least yearly or according to recommendations, and advised to stay away from smoking. I have recommended yearly flu vaccine and pneumonia vaccine at least every 5 years; moderate intensity exercise for up to 150 minutes weekly; and sleep for at least 7 hours a day.  - he is advised to maintain close follow up with [name], DO for primary care needs, as well as his other providers for optimal and coordinated care.     I spent 44 minutes in the care of the patient today including review of labs from  [place], Lipids, Thyroid Function, Hematology (current and previous including abstractions from other facilities); face-to-face time discussing  his blood glucose readings/logs, discussing hypoglycemia and hyperglycemia episodes and symptoms, medications doses, his options of short and long term treatment based on the latest standards of care / guidelines;  discussion about incorporating lifestyle medicine;  and documenting the encounter.    Please refer to Patient Instructions for Blood Glucose Monitoring and Insulin/Medications Dosing Guide"  in media tab for additional information. Please  also refer to " Patient Self Inventory" in the Media  tab for reviewed elements of pertinent patient history.  [name] participated in the discussions, expressed understanding, and voiced agreement with the above plans.  All questions were answered to his satisfaction. he is encouraged to contact clinic should he have any questions or concerns prior to his return visit.   Follow up plan: - Return in about 3 months (around 11/11/2021) for Diabetes F/U with A1c in office, No previsit labs, Bring meter and logs.  Rayetta Pigg, [facility] [address] [place], [place] [address] Phone: [phone] Fax: [phone]  08/13/2021, 11:04 AM

## 2021-08-13 NOTE — Patient Instructions (Signed)

## 2021-08-14 ENCOUNTER — Other Ambulatory Visit: Payer: Self-pay | Admitting: Family Medicine

## 2021-08-14 DIAGNOSIS — F411 Generalized anxiety disorder: Secondary | ICD-10-CM

## 2021-08-15 DIAGNOSIS — Z794 Long term (current) use of insulin: Secondary | ICD-10-CM | POA: Diagnosis not present

## 2021-08-15 DIAGNOSIS — E1140 Type 2 diabetes mellitus with diabetic neuropathy, unspecified: Secondary | ICD-10-CM | POA: Diagnosis not present

## 2021-09-03 ENCOUNTER — Ambulatory Visit (INDEPENDENT_AMBULATORY_CARE_PROVIDER_SITE_OTHER): Payer: Medicare Other

## 2021-09-03 VITALS — Ht 73.0 in | Wt 307.0 lb

## 2021-09-03 DIAGNOSIS — Z0000 Encounter for general adult medical examination without abnormal findings: Secondary | ICD-10-CM | POA: Diagnosis not present

## 2021-09-03 NOTE — Progress Notes (Signed)
Subjective:   [name] is a 71 y.o. male who presents for an Initial Medicare Annual Wellness Visit. [name] , Thank you for taking time to come for your Medicare Wellness Visit. I appreciate your ongoing commitment to your health goals. Please review the following plan we discussed and let me know if I can assist you in the future.   Virtual Visit via Telephone Note  I connected with  [name] on 09/03/21 at  9:00 AM EST by telephone and verified that I am speaking with the correct person using two identifiers.  Location: Patient: HOME Provider: [facility] Persons participating in the virtual visit: patient/Nurse Health Advisor   I discussed the limitations, risks, security and privacy concerns of performing an evaluation and management service by telephone and the availability of in person appointments. The patient expressed understanding and agreed to proceed.  Interactive audio and video telecommunications were attempted between this nurse and patient, however failed, due to patient having technical difficulties OR patient did not have access to video capability.  We continued and completed visit with audio only.  Some vital signs may be absent or patient reported.   Chriss Driver, LPN   Review of Systems     Cardiac Risk Factors include: advanced age (>24men, >40 women);diabetes mellitus;hypertension;dyslipidemia;male gender;sedentary lifestyle;obesity (BMI >30kg/m2)  PHONE VISIT. PT AT HOME. NURSE AT [facility].    Objective:    Today's Vitals   09/03/21 0856  Weight: (!) 307 lb (139.3 kg)  Height: 6\' 1"  (1.854 m)   Body mass index is 40.5 kg/m.  Advanced Directives 09/03/2021 02/09/2019 07/03/2015 09/29/2013 03/31/2013  Does Patient Have a Medical Advance Directive? No No No Patient does not have advance directive;Patient would not like information Patient does not have advance directive;Patient would not like information  Would patient like information on creating a  medical advance directive? No - Patient declined No - Patient declined No - patient declined information - -  Pre-existing out of facility DNR order (yellow form or pink MOST form) - - - No No    Current Medications (verified) Outpatient Encounter Medications as of 09/03/2021  Medication Sig   ALPRAZolam (XANAX) 1 MG tablet Take 0.5-1 tablets (0.5-1 mg total) by mouth 3 (three) times daily as needed for anxiety (take ONLY IF NEEDED).   amLODipine (NORVASC) 5 MG tablet Take 1 tablet (5 mg total) by mouth daily.   Continuous Blood Gluc Sensor (FREESTYLE LIBRE 14 DAY SENSOR) MISC Scan to check glucose at least 4 times daily. DX: E11.4   famotidine (PEPCID) 20 MG tablet TAKE 1 TABLET BY MOUTH TWICE A DAY   glucose blood (ONETOUCH VERIO) test strip Use as instructed to monitor glucose 4 times daily (use as back up method for CGM)   insulin aspart (NOVOLOG FLEXPEN) 100 UNIT/ML FlexPen Inject 10-16 Units into the skin 3 (three) times daily with meals.   insulin degludec (TRESIBA FLEXTOUCH) 200 UNIT/ML FlexTouch Pen Inject 60 Units into the skin in the morning.   Insulin Pen Needle (B-D ULTRAFINE III SHORT PEN) 31G X 8 MM MISC Use to give insulin daily Dx E11.9   Lancets (ONETOUCH ULTRASOFT) lancets Use as instructed to monitor glucose 4 times daily (use as back up method for CGM).   lisinopril (ZESTRIL) 40 MG tablet Take 1 tablet (40 mg total) by mouth daily.   metFORMIN (GLUCOPHAGE-XR) 500 MG 24 hr tablet TAKE 2 TABLETS BY MOUTH TWICE A DAY WITH FOOD   mirtazapine (REMERON)  45 MG tablet Take 1 tablet (45 mg total) by mouth at bedtime.   omeprazole (PRILOSEC) 20 MG capsule Take by mouth.   rosuvastatin (CRESTOR) 20 MG tablet TAKE 1 TABLET BY MOUTH EVERYDAY AT BEDTIME   sildenafil (VIAGRA) 50 MG tablet Take 1 tablet (50 mg total) by mouth daily as needed for erectile dysfunction.   No facility-administered encounter medications on file as of 09/03/2021.    Allergies (verified) Patient has no known  allergies.   History: Past Medical History:  Diagnosis Date   Anxiety    Arthritis    Diabetes mellitus without complication (HCC)    GERD (gastroesophageal reflux disease)    Hypercholesteremia    Hypertension    Past Surgical History:  Procedure Laterality Date   boils     removed form back of skull   CATARACT EXTRACTION W/PHACO Right 04/02/2013   Procedure: CATARACT EXTRACTION PHACO AND INTRAOCULAR LENS PLACEMENT ([place]);  Surgeon: Williams Che, MD;  Location: [facility];  Service: Ophthalmology;  Laterality: Right;  CDE 7.00   CATARACT EXTRACTION W/PHACO Left 10/04/2013   Procedure: CATARACT EXTRACTION PHACO AND INTRAOCULAR LENS PLACEMENT ([facility]);  Surgeon: Williams Che, MD;  Location: [facility];  Service: Ophthalmology;  Laterality: Left;  CDE:1.46   COLONOSCOPY N/A 07/03/2015   Procedure: COLONOSCOPY;  Surgeon: Rogene Houston, MD;  Location: [facility];  Service: Endoscopy;  Laterality: N/A;  830   EYE SURGERY Right    "valve replaced and stent placed" per pt to reroute the fluid in his eye   ROTATOR CUFF REPAIR Right    Family History  Problem Relation Age of Onset   Diabetes Mother    Glaucoma Mother    Social History   Socioeconomic History   Marital status: Divorced    Spouse name: Not on file   Number of children: Not on file   Years of education: Not on file   Highest education level: Not on file  Occupational History   Not on file  Tobacco Use   Smoking status: Former    Packs/day: 0.50    Years: 41.00    Pack years: 20.50    Types: Cigarettes    Quit date: 02/29/2012    Years since quitting: 9.5   Smokeless tobacco: Never  Vaping Use   Vaping Use: Never used  Substance and Sexual Activity   Alcohol use: No    Comment: quit 2012   Drug use: No    Comment: quit in 2012   Sexual activity: Yes    Birth control/protection: None  Other Topics Concern   Not on file  Social History Narrative   Not on file   Social Determinants of Health   Financial  Resource Strain: Low Risk    Difficulty of Paying Living Expenses: Not hard at all  Food Insecurity: No Food Insecurity   Worried About Charity fundraiser in the Last Year: Never true   [place] in the Last Year: Never true  Transportation Needs: No Transportation Needs   Lack of Transportation (Medical): No   Lack of Transportation (Non-Medical): No  Physical Activity: Insufficiently Active   Days of Exercise per Week: 3 days   Minutes of Exercise per Session: 30 min  Stress: No Stress Concern Present   Feeling of Stress : Not at all  Social Connections: Moderately Integrated   Frequency of Communication with Friends and Family: More than three times a week   Frequency of Social Gatherings  with Friends and Family: More than three times a week   Attends Religious Services: More than 4 times per year   Active Member of Clubs or Organizations: Yes   Attends Music therapist: More than 4 times per year   Marital Status: Divorced    Tobacco Counseling Counseling given: Not Answered   Clinical Intake:  Pre-visit preparation completed: Yes  Pain : No/denies pain     BMI - recorded: 40.5 Nutritional Status: BMI > 30  Obese Nutritional Risks: None Diabetes: Yes  How often do you need to have someone help you when you read instructions, pamphlets, or other written materials from your doctor or pharmacy?: 1 - Never  Diabetic?Nutrition Risk Assessment:  Has the patient had any N/V/D within the last 2 months?  No  Does the patient have any non-healing wounds?  No  Has the patient had any unintentional weight loss or weight gain?  No   Diabetes:  Is the patient diabetic?  Yes  If diabetic, was a CBG obtained today?  No  Did the patient bring in their glucometer from home?  No  PHONE VISIT How often do you monitor your CBG's? DAILY.   Financial Strains and Diabetes Management:  Are you having any financial strains with the device, your supplies or your  medication? No .  Does the patient want to be seen by Chronic Care Management for management of their diabetes?  No  Would the patient like to be referred to a Nutritionist or for Diabetic Management?  No   Diabetic Exams:  Diabetic Eye Exam: Completed 2022. Pt has been advised about the importance in completing this exam.  Diabetic Foot Exam: Completed 07/25/2020. Pt has been advised about the importance in completing this exam.   Interpreter Needed?: No  Information entered by :: mj Selinda Korzeniewski, lpn   Activities of Daily Living In your present state of health, do you have any difficulty performing the following activities: 09/03/2021  Hearing? N  Vision? N  Difficulty concentrating or making decisions? N  Walking or climbing stairs? N  Dressing or bathing? N  Doing errands, shopping? N  Preparing Food and eating ? N  Using the Toilet? N  In the past six months, have you accidently leaked urine? N  Do you have problems with loss of bowel control? N  Managing your Medications? N  Managing your Finances? N  Housekeeping or managing your Housekeeping? N  Some recent data might be hidden    Patient Care Team: [name], DO as PCP - General (Family Medicine) Lavera Guise, [facility] (Pharmacist)  Indicate any recent Medical Services you may have received from other than [facility] providers in the past year (date may be approximate).     Assessment:   This is a routine wellness examination for [place].  Hearing/Vision screen Hearing Screening - Comments:: No hearing issues.  Vision Screening - Comments:: Glasses. Dr. Hewitt Blade. 2022.  Dietary issues and exercise activities discussed: Current Exercise Habits: Home exercise routine, Type of exercise: walking, Time (Minutes): 30, Frequency (Times/Week): 3, Weekly Exercise (Minutes/Week): 90, Intensity: Mild, Exercise limited by: cardiac condition(s)   Goals Addressed             This Visit's Progress    Exercise 3x  per week (30 min per time)       Try to increase as tolerated.       Depression Screen PHQ 2/9 Scores 09/03/2021 06/11/2021 03/02/2021 10/30/2020 08/01/2020 04/14/2020 04/04/2020  PHQ - 2 Score 0 0 0 1 1 0 1  PHQ- 9 Score - 1 - 6 3 - -    Fall Risk Fall Risk  09/03/2021 06/11/2021 03/02/2021 12/05/2020 10/30/2020  Falls in the past year? 0 0 0 0 0  Number falls in past yr: 0 - - - -  Injury with Fall? 0 - - - -  Risk for fall due to : Impaired mobility - - Impaired balance/gait;Impaired mobility -  Follow up Falls prevention discussed - - Falls evaluation completed -    FALL RISK PREVENTION PERTAINING TO THE HOME:  Any stairs in or around the home? No  If so, are there any without handrails? No  Home free of loose throw rugs in walkways, pet beds, electrical cords, etc? Yes  Adequate lighting in your home to reduce risk of falls? Yes   ASSISTIVE DEVICES UTILIZED TO PREVENT FALLS:  Life alert? No  Use of a cane, walker or w/c? Yes  Grab bars in the bathroom? Yes  Shower chair or bench in shower? No  Elevated toilet seat or a handicapped toilet? No   TIMED UP AND GO:  Was the test performed? No .  Phone visit.  Cognitive Function:     6CIT Screen 09/03/2021  What Year? 0 points  What month? 0 points  What time? 0 points  Count back from 20 0 points  Months in reverse 0 points  Repeat phrase 6 points  Total Score 6    Immunizations Immunization History  Administered Date(s) Administered   Fluad Quad(high Dose 65+) 04/30/2017, 04/13/2019   Influenza Split 04/19/2014, 05/29/2020   Influenza-Unspecified 04/22/2018, 05/24/2020, 05/19/2021   Moderna SARS-COV2 Booster Vaccination 12/13/2020   Moderna Sars-Covid-2 Vaccination 09/30/2019, 10/29/2019, 06/24/2020, 12/13/2020   Pneumococcal Conjugate-13 04/23/2018   Pneumococcal Polysaccharide-23 12/28/2015   Zoster, Live 01/04/2016    TDAP status: Due, Education has been provided regarding the importance of this vaccine.  Advised may receive this vaccine at local pharmacy or Health Dept. Aware to provide a copy of the vaccination record if obtained from local pharmacy or Health Dept. Verbalized acceptance and understanding.  Flu Vaccine status: Up to date  Pneumococcal vaccine status: Up to date  Covid-19 vaccine status: Completed vaccines  Qualifies for Shingles Vaccine? Yes   Zostavax completed Yes   Shingrix Completed?: No.    Education has been provided regarding the importance of this vaccine. Patient has been advised to call insurance company to determine out of pocket expense if they have not yet received this vaccine. Advised may also receive vaccine at local pharmacy or Health Dept. Verbalized acceptance and understanding.  Screening Tests Health Maintenance  Topic Date Due   OPHTHALMOLOGY EXAM  04/19/2020   COVID-19 Vaccine (5 - Booster for Moderna series) 02/07/2021   FOOT EXAM  07/25/2021   Zoster Vaccines- Shingrix (1 of 2) 09/11/2021 (Originally 09/14/1969)   TETANUS/TDAP  10/30/2021 (Originally 09/14/1969)   HEMOGLOBIN A1C  12/09/2021   COLONOSCOPY (Pts 45-61yrs Insurance coverage will need to be confirmed)  07/02/2025   Pneumonia Vaccine 79+ Years old  Completed   INFLUENZA VACCINE  Completed   Hepatitis C Screening  Completed   HPV VACCINES  Aged Out    Health Maintenance  Health Maintenance Due  Topic Date Due   OPHTHALMOLOGY EXAM  04/19/2020   COVID-19 Vaccine (5 - Booster for Moderna series) 02/07/2021   FOOT EXAM  07/25/2021    Colorectal cancer screening: Type of screening: Colonoscopy. Completed 07/03/2015. Repeat  every 10 years  Lung Cancer Screening: (Low Dose CT Chest recommended if Age 61-80 years, 30 pack-year currently smoking OR have quit w/in 15years.) does not qualify.    Additional Screening:  Hepatitis C Screening: does qualify; Completed 08/24/2018  Vision Screening: Recommended annual ophthalmology exams for early detection of glaucoma and other disorders  of the eye. Is the patient up to date with their annual eye exam?  Yes  Who is the provider or what is the name of the office in which the patient attends annual eye exams? [facility] [place] If pt is not established with a provider, would they like to be referred to a provider to establish care? No .   Dental Screening: Recommended annual dental exams for proper oral hygiene  Community Resource Referral / Chronic Care Management: CRR required this visit?  No   CCM required this visit?  No      Plan:     I have personally reviewed and noted the following in the patients chart:   Medical and social history Use of alcohol, tobacco or illicit drugs  Current medications and supplements including opioid prescriptions. Patient is not currently taking opioid prescriptions. Functional ability and status Nutritional status Physical activity Advanced directives List of other physicians Hospitalizations, surgeries, and ER visits in previous 12 months Vitals Screenings to include cognitive, depression, and falls Referrals and appointments  In addition, I have reviewed and discussed with patient certain preventive protocols, quality metrics, and best practice recommendations. A written personalized care plan for preventive services as well as general preventive health recommendations were provided to patient.     Chriss Driver, LPN   3/00/7622   Nurse Notes: Pt is up to date on health maintenance and vaccines except for Tdap and Shingrix. Discussed how to obtain. 6CIT score of 6.

## 2021-09-03 NOTE — Patient Instructions (Signed)
[name] , Thank you for taking time to come for your Medicare Wellness Visit. I appreciate your ongoing commitment to your health goals. Please review the following plan we discussed and let me know if I can assist you in the future.   Screening recommendations/referrals: Colonoscopy: Done 07/03/2015 Repeat in 10 years  Recommended yearly ophthalmology/optometry visit for glaucoma screening and checkup Recommended yearly dental visit for hygiene and checkup  Vaccinations: Influenza vaccine: Done 05/2021 Repeat annually  Pneumococcal vaccine: Done 12/28/2015 ad 04/23/2018 Tdap vaccine: Due Repeat in 10 years  Shingles vaccine: Done 01/04/2016 Shingrix discussed. Please contact your pharmacy for coverage information.     Covid-19: Done 09/30/2019, 10/29/2019, 06/24/2020 and 12/13/2020.  Advanced directives: Advance directive discussed with you today. Even though you declined this today, please call our office should you change your mind, and we can give you the proper paperwork for you to fill out.   Conditions/risks identified: Aim for 30 minutes of exercise or brisk walking each day, drink 6-8 glasses of water and eat lots of fruits and vegetables.   Next appointment: Follow up in one year for your annual wellness visit. 2024.  Preventive Care 71 Years and Older, Male  Preventive care refers to lifestyle choices and visits with your health care provider that can promote health and wellness. What does preventive care include? A yearly physical exam. This is also called an annual well check. Dental exams once or twice a year. Routine eye exams. Ask your health care provider how often you should have your eyes checked. Personal lifestyle choices, including: Daily care of your teeth and gums. Regular physical activity. Eating a healthy diet. Avoiding tobacco and drug use. Limiting alcohol use. Practicing safe sex. Taking low doses of aspirin every day. Taking vitamin and mineral  supplements as recommended by your health care provider. What happens during an annual well check? The services and screenings done by your health care provider during your annual well check will depend on your age, overall health, lifestyle risk factors, and family history of disease. Counseling  Your health care provider may ask you questions about your: Alcohol use. Tobacco use. Drug use. Emotional well-being. Home and relationship well-being. Sexual activity. Eating habits. History of falls. Memory and ability to understand (cognition). Work and work Statistician. Screening  You may have the following tests or measurements: Height, weight, and BMI. Blood pressure. Lipid and cholesterol levels. These may be checked every 5 years, or more frequently if you are over 31 years old. Skin check. Lung cancer screening. You may have this screening every year starting at age 33 if you have a 30-pack-year history of smoking and currently smoke or have quit within the past 15 years. Fecal occult blood test (FOBT) of the stool. You may have this test every year starting at age 13. Flexible sigmoidoscopy or colonoscopy. You may have a sigmoidoscopy every 5 years or a colonoscopy every 10 years starting at age 44. Prostate cancer screening. Recommendations will vary depending on your family history and other risks. Hepatitis C blood test. Hepatitis B blood test. Sexually transmitted disease (STD) testing. Diabetes screening. This is done by checking your blood sugar (glucose) after you have not eaten for a while (fasting). You may have this done every 1-3 years. Abdominal aortic aneurysm (AAA) screening. You may need this if you are a current or former smoker. Osteoporosis. You may be screened starting at age 32 if you are at high risk. Talk with your health care provider about  your test results, treatment options, and if necessary, the need for more tests. Vaccines  Your health care provider  may recommend certain vaccines, such as: Influenza vaccine. This is recommended every year. Tetanus, diphtheria, and acellular pertussis (Tdap, Td) vaccine. You may need a Td booster every 10 years. Zoster vaccine. You may need this after age 60. Pneumococcal 13-valent conjugate (PCV13) vaccine. One dose is recommended after age 61. Pneumococcal polysaccharide (PPSV23) vaccine. One dose is recommended after age 8. Talk to your health care provider about which screenings and vaccines you need and how often you need them. This information is not intended to replace advice given to you by your health care provider. Make sure you discuss any questions you have with your health care provider. Document Released: 08/18/2015 Document Revised: 04/10/2016 Document Reviewed: 05/23/2015 Elsevier Interactive Patient Education  2017 [place] Prevention in the Home Falls can cause injuries. They can happen to people of all ages. There are many things you can do to make your home safe and to help prevent falls. What can I do on the outside of my home? Regularly fix the edges of walkways and driveways and fix any cracks. Remove anything that might make you trip as you walk through a door, such as a raised step or threshold. Trim any bushes or trees on the path to your home. Use bright outdoor lighting. Clear any walking paths of anything that might make someone trip, such as rocks or tools. Regularly check to see if handrails are loose or broken. Make sure that both sides of any steps have handrails. Any raised decks and porches should have guardrails on the edges. Have any leaves, snow, or ice cleared regularly. Use sand or salt on walking paths during winter. Clean up any spills in your garage right away. This includes oil or grease spills. What can I do in the bathroom? Use night lights. Install grab bars by the toilet and in the tub and shower. Do not use towel bars as grab bars. Use  non-skid mats or decals in the tub or shower. If you need to sit down in the shower, use a plastic, non-slip stool. Keep the floor dry. Clean up any water that spills on the floor as soon as it happens. Remove soap buildup in the tub or shower regularly. Attach bath mats securely with double-sided non-slip rug tape. Do not have throw rugs and other things on the floor that can make you trip. What can I do in the bedroom? Use night lights. Make sure that you have a light by your bed that is easy to reach. Do not use any sheets or blankets that are too big for your bed. They should not hang down onto the floor. Have a firm chair that has side arms. You can use this for support while you get dressed. Do not have throw rugs and other things on the floor that can make you trip. What can I do in the kitchen? Clean up any spills right away. Avoid walking on wet floors. Keep items that you use a lot in easy-to-reach places. If you need to reach something above you, use a strong step stool that has a grab bar. Keep electrical cords out of the way. Do not use floor polish or wax that makes floors slippery. If you must use wax, use non-skid floor wax. Do not have throw rugs and other things on the floor that can make you trip. What can I do with  my stairs? Do not leave any items on the stairs. Make sure that there are handrails on both sides of the stairs and use them. Fix handrails that are broken or loose. Make sure that handrails are as long as the stairways. Check any carpeting to make sure that it is firmly attached to the stairs. Fix any carpet that is loose or worn. Avoid having throw rugs at the top or bottom of the stairs. If you do have throw rugs, attach them to the floor with carpet tape. Make sure that you have a light switch at the top of the stairs and the bottom of the stairs. If you do not have them, ask someone to add them for you. What else can I do to help prevent falls? Wear  shoes that: Do not have high heels. Have rubber bottoms. Are comfortable and fit you well. Are closed at the toe. Do not wear sandals. If you use a stepladder: Make sure that it is fully opened. Do not climb a closed stepladder. Make sure that both sides of the stepladder are locked into place. Ask someone to hold it for you, if possible. Clearly mark and make sure that you can see: Any grab bars or handrails. First and last steps. Where the edge of each step is. Use tools that help you move around (mobility aids) if they are needed. These include: Canes. Walkers. Scooters. Crutches. Turn on the lights when you go into a dark area. Replace any light bulbs as soon as they burn out. Set up your furniture so you have a clear path. Avoid moving your furniture around. If any of your floors are uneven, fix them. If there are any pets around you, be aware of where they are. Review your medicines with your doctor. Some medicines can make you feel dizzy. This can increase your chance of falling. Ask your doctor what other things that you can do to help prevent falls. This information is not intended to replace advice given to you by your health care provider. Make sure you discuss any questions you have with your health care provider. Document Released: 05/18/2009 Document Revised: 12/28/2015 Document Reviewed: 08/26/2014 Elsevier Interactive Patient Education  2017 [employer].

## 2021-09-11 DIAGNOSIS — M79676 Pain in unspecified toe(s): Secondary | ICD-10-CM | POA: Diagnosis not present

## 2021-09-11 DIAGNOSIS — E1142 Type 2 diabetes mellitus with diabetic polyneuropathy: Secondary | ICD-10-CM | POA: Diagnosis not present

## 2021-09-11 DIAGNOSIS — L84 Corns and callosities: Secondary | ICD-10-CM | POA: Diagnosis not present

## 2021-09-11 DIAGNOSIS — B351 Tinea unguium: Secondary | ICD-10-CM | POA: Diagnosis not present

## 2021-09-26 ENCOUNTER — Telehealth: Payer: Medicare Other

## 2021-10-01 ENCOUNTER — Telehealth: Payer: Self-pay | Admitting: Family Medicine

## 2021-10-01 NOTE — Telephone Encounter (Signed)
Could not find any samples, pt aware

## 2021-10-12 ENCOUNTER — Telehealth: Payer: Self-pay | Admitting: Family Medicine

## 2021-10-12 NOTE — Telephone Encounter (Signed)
Pt aware - we do not have this at this time ?

## 2021-10-29 ENCOUNTER — Inpatient Hospital Stay (HOSPITAL_COMMUNITY)
Admission: EM | Admit: 2021-10-29 | Discharge: 2021-11-01 | DRG: 291 | Disposition: A | Discharge status: Home / Self Care | Payer: Medicare Other | Attending: Internal Medicine | Admitting: Internal Medicine

## 2021-10-29 ENCOUNTER — Emergency Department (HOSPITAL_COMMUNITY): Payer: Medicare Other

## 2021-10-29 ENCOUNTER — Other Ambulatory Visit: Payer: Self-pay

## 2021-10-29 ENCOUNTER — Other Ambulatory Visit (HOSPITAL_COMMUNITY): Payer: Medicare Other

## 2021-10-29 ENCOUNTER — Encounter (HOSPITAL_COMMUNITY): Payer: Self-pay | Admitting: Emergency Medicine

## 2021-10-29 DIAGNOSIS — M7989 Other specified soft tissue disorders: Secondary | ICD-10-CM | POA: Diagnosis not present

## 2021-10-29 DIAGNOSIS — I509 Heart failure, unspecified: Secondary | ICD-10-CM | POA: Diagnosis not present

## 2021-10-29 DIAGNOSIS — R591 Generalized enlarged lymph nodes: Secondary | ICD-10-CM | POA: Diagnosis present

## 2021-10-29 DIAGNOSIS — Z79890 Hormone replacement therapy (postmenopausal): Secondary | ICD-10-CM | POA: Diagnosis not present

## 2021-10-29 DIAGNOSIS — Z961 Presence of intraocular lens: Secondary | ICD-10-CM | POA: Diagnosis present

## 2021-10-29 DIAGNOSIS — I4891 Unspecified atrial fibrillation: Secondary | ICD-10-CM

## 2021-10-29 DIAGNOSIS — M1990 Unspecified osteoarthritis, unspecified site: Secondary | ICD-10-CM | POA: Diagnosis not present

## 2021-10-29 DIAGNOSIS — R0902 Hypoxemia: Secondary | ICD-10-CM | POA: Diagnosis not present

## 2021-10-29 DIAGNOSIS — Z79899 Other long term (current) drug therapy: Secondary | ICD-10-CM

## 2021-10-29 DIAGNOSIS — K219 Gastro-esophageal reflux disease without esophagitis: Secondary | ICD-10-CM | POA: Diagnosis present

## 2021-10-29 DIAGNOSIS — I499 Cardiac arrhythmia, unspecified: Secondary | ICD-10-CM | POA: Diagnosis not present

## 2021-10-29 DIAGNOSIS — Z6841 Body Mass Index (BMI) 40.0 and over, adult: Secondary | ICD-10-CM

## 2021-10-29 DIAGNOSIS — Z7984 Long term (current) use of oral hypoglycemic drugs: Secondary | ICD-10-CM

## 2021-10-29 DIAGNOSIS — I152 Hypertension secondary to endocrine disorders: Secondary | ICD-10-CM | POA: Diagnosis not present

## 2021-10-29 DIAGNOSIS — Z833 Family history of diabetes mellitus: Secondary | ICD-10-CM

## 2021-10-29 DIAGNOSIS — J9811 Atelectasis: Secondary | ICD-10-CM | POA: Diagnosis not present

## 2021-10-29 DIAGNOSIS — E1169 Type 2 diabetes mellitus with other specified complication: Secondary | ICD-10-CM | POA: Diagnosis not present

## 2021-10-29 DIAGNOSIS — Z794 Long term (current) use of insulin: Secondary | ICD-10-CM

## 2021-10-29 DIAGNOSIS — Z87891 Personal history of nicotine dependence: Secondary | ICD-10-CM | POA: Diagnosis not present

## 2021-10-29 DIAGNOSIS — I5033 Acute on chronic diastolic (congestive) heart failure: Secondary | ICD-10-CM | POA: Diagnosis present

## 2021-10-29 DIAGNOSIS — Z20822 Contact with and (suspected) exposure to covid-19: Secondary | ICD-10-CM | POA: Diagnosis not present

## 2021-10-29 DIAGNOSIS — E876 Hypokalemia: Secondary | ICD-10-CM | POA: Diagnosis not present

## 2021-10-29 DIAGNOSIS — Z743 Need for continuous supervision: Secondary | ICD-10-CM | POA: Diagnosis not present

## 2021-10-29 DIAGNOSIS — F411 Generalized anxiety disorder: Secondary | ICD-10-CM | POA: Diagnosis present

## 2021-10-29 DIAGNOSIS — I110 Hypertensive heart disease with heart failure: Principal | ICD-10-CM | POA: Diagnosis present

## 2021-10-29 DIAGNOSIS — E041 Nontoxic single thyroid nodule: Secondary | ICD-10-CM | POA: Diagnosis present

## 2021-10-29 DIAGNOSIS — R6889 Other general symptoms and signs: Secondary | ICD-10-CM | POA: Diagnosis not present

## 2021-10-29 DIAGNOSIS — E119 Type 2 diabetes mellitus without complications: Secondary | ICD-10-CM

## 2021-10-29 DIAGNOSIS — R0689 Other abnormalities of breathing: Secondary | ICD-10-CM | POA: Diagnosis not present

## 2021-10-29 DIAGNOSIS — Z7985 Long-term (current) use of injectable non-insulin antidiabetic drugs: Secondary | ICD-10-CM

## 2021-10-29 DIAGNOSIS — E7800 Pure hypercholesterolemia, unspecified: Secondary | ICD-10-CM | POA: Diagnosis present

## 2021-10-29 DIAGNOSIS — E1159 Type 2 diabetes mellitus with other circulatory complications: Secondary | ICD-10-CM | POA: Diagnosis present

## 2021-10-29 DIAGNOSIS — R0602 Shortness of breath: Secondary | ICD-10-CM

## 2021-10-29 DIAGNOSIS — J90 Pleural effusion, not elsewhere classified: Secondary | ICD-10-CM | POA: Diagnosis not present

## 2021-10-29 DIAGNOSIS — R911 Solitary pulmonary nodule: Secondary | ICD-10-CM | POA: Diagnosis not present

## 2021-10-29 DIAGNOSIS — R062 Wheezing: Secondary | ICD-10-CM | POA: Diagnosis not present

## 2021-10-29 LAB — CBC WITH DIFFERENTIAL/PLATELET
Abs Immature Granulocytes: 0.03 10*3/uL (ref 0.00–0.07)
Basophils Absolute: 0.1 10*3/uL (ref 0.0–0.1)
Basophils Relative: 1 %
Eosinophils Absolute: 0.1 10*3/uL (ref 0.0–0.5)
Eosinophils Relative: 1 %
HCT: 41.7 % (ref 39.0–52.0)
Hemoglobin: 12.4 g/dL — ABNORMAL LOW (ref 13.0–17.0)
Immature Granulocytes: 1 %
Lymphocytes Relative: 21 %
Lymphs Abs: 1.4 10*3/uL (ref 0.7–4.0)
MCH: 24.1 pg — ABNORMAL LOW (ref 26.0–34.0)
MCHC: 29.7 g/dL — ABNORMAL LOW (ref 30.0–36.0)
MCV: 81 fL (ref 80.0–100.0)
Monocytes Absolute: 0.4 10*3/uL (ref 0.1–1.0)
Monocytes Relative: 7 %
Neutro Abs: 4.6 10*3/uL (ref 1.7–7.7)
Neutrophils Relative %: 69 %
Platelets: 207 10*3/uL (ref 150–400)
RBC: 5.15 MIL/uL (ref 4.22–5.81)
RDW: 18.6 % — ABNORMAL HIGH (ref 11.5–15.5)
WBC: 6.6 10*3/uL (ref 4.0–10.5)
nRBC: 0 % (ref 0.0–0.2)

## 2021-10-29 LAB — RETICULOCYTES
Immature Retic Fract: 25.7 % — ABNORMAL HIGH (ref 2.3–15.9)
RBC.: 5.22 MIL/uL (ref 4.22–5.81)
Retic Count, Absolute: 95 10*3/uL (ref 19.0–186.0)
Retic Ct Pct: 1.8 % (ref 0.4–3.1)

## 2021-10-29 LAB — GLUCOSE, CAPILLARY
Glucose-Capillary: 195 mg/dL — ABNORMAL HIGH (ref 70–99)
Glucose-Capillary: 172 mg/dL — ABNORMAL HIGH (ref 70–99)

## 2021-10-29 LAB — COMPREHENSIVE METABOLIC PANEL
ALT: 17 U/L (ref 0–44)
AST: 24 U/L (ref 15–41)
Albumin: 3.7 g/dL (ref 3.5–5.0)
Alkaline Phosphatase: 87 U/L (ref 38–126)
Anion gap: 7 (ref 5–15)
BUN: 17 mg/dL (ref 8–23)
CO2: 28 mmol/L (ref 22–32)
Calcium: 9 mg/dL (ref 8.9–10.3)
Chloride: 106 mmol/L (ref 98–111)
Creatinine, Ser: 0.91 mg/dL (ref 0.61–1.24)
GFR, Estimated: >60 mL/min (ref >60)
Glucose, Bld: 154 mg/dL — ABNORMAL HIGH (ref 70–99)
Potassium: 4.3 mmol/L (ref 3.5–5.1)
Sodium: 141 mmol/L (ref 135–145)
Total Bilirubin: 0.8 mg/dL (ref 0.3–1.2)
Total Protein: 8.2 g/dL — ABNORMAL HIGH (ref 6.5–8.1)

## 2021-10-29 LAB — FOLATE: Folate: 12.8 ng/mL (ref >5.9)

## 2021-10-29 LAB — BRAIN NATRIURETIC PEPTIDE: B Natriuretic Peptide: 258 pg/mL — ABNORMAL HIGH (ref 0.0–100.0)

## 2021-10-29 LAB — IRON AND TIBC
Iron: 43 ug/dL — ABNORMAL LOW (ref 45–182)
Saturation Ratios: 11 % — ABNORMAL LOW (ref 17.9–39.5)
TIBC: 405 ug/dL (ref 250–450)
UIBC: 362 ug/dL

## 2021-10-29 LAB — RESP PANEL BY RT-PCR (FLU A&B, COVID) ARPGX2
Influenza A by PCR: NEGATIVE
Influenza B by PCR: NEGATIVE
SARS Coronavirus 2 by RT PCR: NEGATIVE

## 2021-10-29 LAB — FERRITIN: Ferritin: 30 ng/mL (ref 24–336)

## 2021-10-29 LAB — HEMOGLOBIN A1C
Hgb A1c MFr Bld: 8.1 % — ABNORMAL HIGH (ref 4.8–5.6)
Mean Plasma Glucose: 185.77 mg/dL

## 2021-10-29 LAB — CBG MONITORING, ED: Glucose-Capillary: 153 mg/dL — ABNORMAL HIGH (ref 70–99)

## 2021-10-29 LAB — VITAMIN B12: Vitamin B-12: 122 pg/mL — ABNORMAL LOW (ref 180–914)

## 2021-10-29 LAB — TSH: TSH: 0.663 u[IU]/mL (ref 0.350–4.500)

## 2021-10-29 MED ORDER — ALPRAZOLAM 0.5 MG PO TABS
0.5000 mg | ORAL_TABLET | Freq: Three times a day (TID) | ORAL | Status: DC | PRN
  Administered 2021-10-30: 1 mg via ORAL
  Administered 2021-10-31 (×2): 0.5 mg via ORAL
  Administered 2021-11-01: 1 mg via ORAL
  Filled 2021-10-29: qty 1
  Filled 2021-10-29: qty 2
  Filled 2021-10-29: qty 1
  Filled 2021-10-29: qty 2

## 2021-10-29 MED ORDER — ACETAMINOPHEN 325 MG PO TABS: 650.0000 mg | ORAL_TABLET | Freq: Four times a day (QID) | ORAL | Status: DC | PRN

## 2021-10-29 MED ORDER — AMLODIPINE BESYLATE 5 MG PO TABS
5.0000 mg | ORAL_TABLET | Freq: Every day | ORAL | Status: DC
  Administered 2021-10-29 – 2021-11-01 (×4): 5 mg via ORAL
  Filled 2021-10-29 (×4): qty 1

## 2021-10-29 MED ORDER — PANTOPRAZOLE SODIUM 40 MG PO TBEC
40.0000 mg | DELAYED_RELEASE_TABLET | Freq: Every day | ORAL | Status: DC
Start: 2021-10-29 — End: 2021-11-01
  Administered 2021-10-29 – 2021-11-01 (×4): 40 mg via ORAL
  Filled 2021-10-29 (×4): qty 1

## 2021-10-29 MED ORDER — IPRATROPIUM-ALBUTEROL 0.5-2.5 (3) MG/3ML IN SOLN: 3.0000 mL | Freq: Four times a day (QID) | RESPIRATORY_TRACT | Status: DC | PRN

## 2021-10-29 MED ORDER — INSULIN ASPART 100 UNIT/ML IJ SOLN
0.0000 [IU] | Freq: Three times a day (TID) | INTRAMUSCULAR | Status: DC
  Administered 2021-10-29 – 2021-10-30 (×3): 3 [IU] via SUBCUTANEOUS
  Administered 2021-10-30: 5 [IU] via SUBCUTANEOUS
  Administered 2021-10-30: 8 [IU] via SUBCUTANEOUS
  Administered 2021-10-31: 3 [IU] via SUBCUTANEOUS
  Administered 2021-10-31: 8 [IU] via SUBCUTANEOUS
  Administered 2021-10-31 – 2021-11-01 (×2): 3 [IU] via SUBCUTANEOUS

## 2021-10-29 MED ORDER — ROSUVASTATIN CALCIUM 20 MG PO TABS
10.0000 mg | ORAL_TABLET | Freq: Every day | ORAL | Status: DC
  Administered 2021-10-29 – 2021-10-31 (×3): 10 mg via ORAL
  Filled 2021-10-29 (×3): qty 1

## 2021-10-29 MED ORDER — ONDANSETRON HCL 4 MG PO TABS: 4.0000 mg | ORAL_TABLET | Freq: Four times a day (QID) | ORAL | Status: DC | PRN

## 2021-10-29 MED ORDER — FUROSEMIDE 10 MG/ML IJ SOLN
40.0000 mg | Freq: Once | INTRAMUSCULAR | Status: AC
  Administered 2021-10-29: 40 mg via INTRAVENOUS
  Filled 2021-10-29: qty 4

## 2021-10-29 MED ORDER — MIRTAZAPINE 30 MG PO TABS
45.0000 mg | ORAL_TABLET | Freq: Every day | ORAL | Status: DC
  Administered 2021-10-29 – 2021-10-31 (×3): 45 mg via ORAL
  Filled 2021-10-29 (×3): qty 1

## 2021-10-29 MED ORDER — APIXABAN 5 MG PO TABS
5.0000 mg | ORAL_TABLET | Freq: Two times a day (BID) | ORAL | Status: DC
  Administered 2021-10-29 – 2021-11-01 (×7): 5 mg via ORAL
  Filled 2021-10-29 (×7): qty 1

## 2021-10-29 MED ORDER — SODIUM CHLORIDE 0.9% FLUSH
3.0000 mL | Freq: Two times a day (BID) | INTRAVENOUS | Status: DC
  Administered 2021-10-29 – 2021-10-31 (×6): 3 mL via INTRAVENOUS

## 2021-10-29 MED ORDER — FUROSEMIDE 10 MG/ML IJ SOLN
40.0000 mg | Freq: Two times a day (BID) | INTRAMUSCULAR | Status: DC
  Administered 2021-10-29 – 2021-11-01 (×6): 40 mg via INTRAVENOUS
  Filled 2021-10-29 (×6): qty 4

## 2021-10-29 MED ORDER — ACETAMINOPHEN 650 MG RE SUPP
650.0000 mg | Freq: Four times a day (QID) | RECTAL | Status: DC | PRN
Start: 2021-10-29 — End: 2021-11-01

## 2021-10-29 MED ORDER — SODIUM CHLORIDE 0.9 % IV SOLN: 250.0000 mL | INTRAVENOUS | Status: DC | PRN

## 2021-10-29 MED ORDER — INSULIN ASPART 100 UNIT/ML IJ SOLN
0.0000 [IU] | Freq: Every day | INTRAMUSCULAR | Status: DC
  Administered 2021-10-30: 2 [IU] via SUBCUTANEOUS

## 2021-10-29 MED ORDER — INSULIN GLARGINE-YFGN 100 UNIT/ML ~~LOC~~ SOLN
30.0000 [IU] | Freq: Every evening | SUBCUTANEOUS | Status: DC
  Administered 2021-10-29 – 2021-10-31 (×3): 30 [IU] via SUBCUTANEOUS
  Filled 2021-10-29 (×4): qty 0.3

## 2021-10-29 MED ORDER — LISINOPRIL 10 MG PO TABS: 40.0000 mg | ORAL_TABLET | Freq: Every day | ORAL | Status: DC

## 2021-10-29 MED ORDER — IOHEXOL 350 MG/ML SOLN
100.0000 mL | Freq: Once | INTRAVENOUS | Status: AC | PRN
  Administered 2021-10-29: 80 mL via INTRAVENOUS

## 2021-10-29 MED ORDER — ONDANSETRON HCL 4 MG/2ML IJ SOLN
4.0000 mg | Freq: Four times a day (QID) | INTRAMUSCULAR | Status: DC | PRN
Start: 2021-10-29 — End: 2021-11-01

## 2021-10-29 MED ORDER — SODIUM CHLORIDE 0.9% FLUSH: 3.0000 mL | INTRAVENOUS | Status: DC | PRN

## 2021-10-29 NOTE — H&P (Signed)
?History and Physical  ? ? ?Patient: [name] YQM:[id number] DOB: 1951/02/02 ?DOA: 10/29/2021 ?DOS: the patient was seen and examined on 10/29/2021 ?PCP: [name], DO  ?Patient coming from: Home ? ?Chief Complaint:  ?Chief Complaint  ?Patient presents with  ? Shortness of Breath  ? ?HPI: [name] is a 72 y.o. male with medical history significant of obesity, type 2 diabetes, hypertension, dyslipidemia, GERD, and anxiety who presented to the ED with complaints of shortness of breath that began approximately 2 days ago.  It was noted to be worse this morning and he denies any specific orthopnea or dyspnea on exertion.  He says that his lower extremities have been swelling in the last 2 days.  He does admit to increased sodium intake over the last 2-3 days.  He also states that he has had a mild, nonproductive cough.  No fevers or chills noted.  No sick contacts.  He has been taking his home medications as otherwise prescribed and states that he quit smoking over 10 years ago.  He denies any current alcohol use. ? ?In the ED, he has had CT of his chest with noted pulmonary nodule, no signs of PE, or infiltrate noted.  Small bilateral pleural effusions noted.  Chest x-ray with no acute abnormalities.  BNP 258.  EKG with noted atrial fibrillation that is currently rate controlled.  He was given IV Lasix 40 mg for initiation of diuresis.  He is currently requiring 2-3 L nasal cannula to maintain his oxygen saturation in the 90th percentile.  Daughter is at bedside. ? ?Review of Systems: As mentioned in the history of present illness. All other systems reviewed and are negative. ?Past Medical History:  ?Diagnosis Date  ? Anxiety   ? Arthritis   ? Diabetes mellitus without complication ([place])   ? GERD (gastroesophageal reflux disease)   ? Hypercholesteremia   ? Hypertension   ? ?Past Surgical History:  ?Procedure Laterality Date  ? boils    ? removed form back of skull  ? CATARACT EXTRACTION W/PHACO Right  04/02/2013  ? Procedure: CATARACT EXTRACTION PHACO AND INTRAOCULAR LENS PLACEMENT (IOC);  Surgeon: Williams Che, MD;  Location: [facility];  Service: Ophthalmology;  Laterality: Right;  CDE 7.00  ? CATARACT EXTRACTION W/PHACO Left 10/04/2013  ? Procedure: CATARACT EXTRACTION PHACO AND INTRAOCULAR LENS PLACEMENT (IOC);  Surgeon: Williams Che, MD;  Location: [facility];  Service: Ophthalmology;  Laterality: Left;  CDE:1.46  ? COLONOSCOPY N/A 07/03/2015  ? Procedure: COLONOSCOPY;  Surgeon: Rogene Houston, MD;  Location: [facility];  Service: Endoscopy;  Laterality: N/A;  830  ? EYE SURGERY Right   ? "valve replaced and stent placed" per pt to reroute the fluid in his eye  ? ROTATOR CUFF REPAIR Right   ? ?Social History:  reports that he quit smoking about 9 years ago. His smoking use included cigarettes. He has a 20.50 pack-year smoking history. He has never used smokeless tobacco. He reports that he does not drink alcohol and does not use drugs. ? ?No Known Allergies ? ?Family History  ?Problem Relation Age of Onset  ? Diabetes Mother   ? Glaucoma Mother   ? ? ?Prior to Admission medications   ?Medication Sig Start Date End Date Taking? Authorizing Provider  ?ALPRAZolam (XANAX) 1 MG tablet Take 0.5-1 tablets (0.5-1 mg total) by mouth 3 (three) times daily as needed for anxiety (take ONLY IF NEEDED). 06/11/21  Yes [name], DO  ?  amLODipine (NORVASC) 5 MG tablet Take 1 tablet (5 mg total) by mouth daily. 06/11/21  Yes Gottschalk, Leatrice Jewels M, DO  ?famotidine (PEPCID) 20 MG tablet TAKE 1 TABLET BY MOUTH TWICE A DAY 05/14/21  Yes Gottschalk, Ashly M, DO  ?insulin aspart (NOVOLOG FLEXPEN) 100 UNIT/ML FlexPen Inject 10-16 Units into the skin 3 (three) times daily with meals.   Yes [provider]  ?insulin degludec (TRESIBA FLEXTOUCH) 200 UNIT/ML FlexTouch Pen Inject 60 Units into the skin in the morning. 04/10/21  Yes Brita Romp, NP  ?lisinopril (ZESTRIL) 40 MG tablet Take 1 tablet (40 mg total) by  mouth daily. 06/11/21  Yes Gottschalk, Leatrice Jewels M, DO  ?metFORMIN (GLUCOPHAGE-XR) 500 MG 24 hr tablet TAKE 2 TABLETS BY MOUTH TWICE A DAY WITH FOOD 01/25/21  Yes Ronnie Doss M, DO  ?mirtazapine (REMERON) 45 MG tablet Take 1 tablet (45 mg total) by mouth at bedtime. 06/11/21  Yes Ronnie Doss M, DO  ?omeprazole (PRILOSEC) 20 MG capsule Take 20 mg by mouth daily.   Yes [provider]  ?rosuvastatin (CRESTOR) 20 MG tablet TAKE 1 TABLET BY MOUTH EVERYDAY AT BEDTIME 06/11/21  Yes Ronnie Doss M, DO  ?sildenafil (VIAGRA) 50 MG tablet Take 1 tablet (50 mg total) by mouth daily as needed for erectile dysfunction. 08/16/20  Yes [name], DO  ?Continuous Blood Gluc Sensor (FREESTYLE LIBRE 14 DAY SENSOR) MISC Scan to check glucose at least 4 times daily. DX: E11.4 06/21/21   Ronnie Doss M, DO  ?glucose blood (ONETOUCH VERIO) test strip Use as instructed to monitor glucose 4 times daily (use as back up method for CGM) 04/10/21   Brita Romp, NP  ?Insulin Pen Needle (B-D ULTRAFINE III SHORT PEN) 31G X 8 MM MISC Use to give insulin daily Dx E11.9 06/09/19   Baruch Gouty, FNP  ?Lancets (ONETOUCH ULTRASOFT) lancets Use as instructed to monitor glucose 4 times daily (use as back up method for CGM). 04/10/21   Brita Romp, NP  ? ? ?Physical Exam: ?Vitals:  ? 10/29/21 0825 10/29/21 0830 10/29/21 0900 10/29/21 1000  ?BP: (!) 174/84 (!) 157/90 (!) 155/78 (!) 161/92  ?Pulse: 91 72 88 77  ?Resp: (!) 22 (!) 26 (!) 25 (!) 23  ?Temp:      ?TempSrc:      ?SpO2: 98% 99% 98% 99%  ?Weight:      ?Height:      ? ?Examination: ?Physical Exam: ? ?Constitutional: WN/WD, NAD and appears calm and comfortable, obese ?Neck: Appears normal, supple, no cervical masses, normal ROM, no appreciable thyromegaly ?Respiratory: Clear to auscultation bilaterally, no wheezing, rales, rhonchi or crackles. Normal respiratory effort and patient is not tachypenic. No accessory muscle use.  ?Cardiovascular: RRR, no murmurs /  rubs / gallops. S1 and S2 auscultated. No extremity edema. 2+ pedal pulses. No carotid bruits.  ?Abdomen: Soft, non-tender, non-distended. No masses palpated. No appreciable hepatosplenomegaly. Bowel sounds positive.  ?GU: Deferred. ?Musculoskeletal: Bilateral pitting edema to knees ?Skin: No rashes, lesions, ulcers. No induration; Warm and dry.  ?Neurologic: CN 2-12 grossly intact with no focal deficits.  ?Psychiatric: Normal judgment and insight. Alert and oriented x 3. Normal mood and appropriate affect.  ? ?Data Reviewed: ? ?There are no new results to review at this time. ? ?Assessment and Plan: ?* Acute CHF ([place]) ?BNP elevated and patient noted to be volume overloaded ?Continue IV Lasix twice daily ?Strict I's and O's along with daily weights ?Claims to have baseline weight near  275 pounds ?Check 2D echocardiogram ? ?Atrial fibrillation ([place]) ?Unknown time of onset, but new diagnosis ?Check 2D echocardiogram and TSH ?Monitor on telemetry and initiate rate control medications after evaluation of echocardiogram ?Initiate anticoagulation with Eliquis due Chads2Vasc of 3 ?Follow-up cardiology outpatient unless rate control becomes an issue inpatient ? ?Morbid obesity ([place]) ?Lifestyle changes outpatient ? ?GAD (generalized anxiety disorder) ?Continue home Xanax and Remeron ? ?Hypertension associated with diabetes ([place]) ?Continue amlodipine ? ?Gastroesophageal reflux disease without esophagitis ?Continue PPI ? ?Diabetes mellitus ([place]) ?Hold home oral agents and continue on SSI ?Carb modified diet ? ? Advance Care Planning: Full code ? ?Consults: None ? ?Family Communication: Daughter at bedside ? ?Severity of Illness: ?The appropriate patient status for this patient is OBSERVATION. Observation status is judged to be reasonable and necessary in order to provide the required intensity of service to ensure the patient's safety. The patient's presenting symptoms, physical exam findings, and initial radiographic and  laboratory data in the context of their medical condition is felt to place them at decreased risk for further clinical deterioration. Furthermore, it is anticipated that the patient will be medically stable fo

## 2021-10-29 NOTE — Assessment & Plan Note (Signed)
Continue PPI ?

## 2021-10-29 NOTE — Assessment & Plan Note (Signed)
Hold home oral agents and continue on SSI ?Carb modified diet ?

## 2021-10-29 NOTE — ED Provider Notes (Signed)
?[facility] ?Provider Note ? ? ?CSN: [id number] ?Arrival date & time: 10/29/21  0709 ? ?  ? ?History ? ?No chief complaint on file. ? ? ?[name] is a 71 y.o. male. ? ?Patient with a complaint of shortness of breath for 2 days.  Worse this morning.  Brought in by EMS.  Patient denies any history of CHF.  Patient has noted bilateral swelling to his lower extremities.  Denies any chest pain.  Does not use oxygen at home.  Patient started on oxygen here.  Oxygen saturations on room air would go down to 86% ? ?Past medical history sniffing for hypertension diabetes gastroesophageal reflux disease high cholesterol.  Patient is a former smoker and quit in 2013.  Denies any history of emphysema or COPD. ? ? ?  ? ?Home Medications ?Prior to Admission medications   ?Medication Sig Start Date End Date Taking? Authorizing Provider  ?ALPRAZolam (XANAX) 1 MG tablet Take 0.5-1 tablets (0.5-1 mg total) by mouth 3 (three) times daily as needed for anxiety (take ONLY IF NEEDED). 06/11/21  Yes Ronnie Doss M, DO  ?amLODipine (NORVASC) 5 MG tablet Take 1 tablet (5 mg total) by mouth daily. 06/11/21  Yes Gottschalk, Leatrice Jewels M, DO  ?famotidine (PEPCID) 20 MG tablet TAKE 1 TABLET BY MOUTH TWICE A DAY 05/14/21  Yes Gottschalk, Ashly M, DO  ?insulin aspart (NOVOLOG FLEXPEN) 100 UNIT/ML FlexPen Inject 10-16 Units into the skin 3 (three) times daily with meals.   Yes [provider]  ?insulin degludec (TRESIBA FLEXTOUCH) 200 UNIT/ML FlexTouch Pen Inject 60 Units into the skin in the morning. 04/10/21  Yes Brita Romp, NP  ?lisinopril (ZESTRIL) 40 MG tablet Take 1 tablet (40 mg total) by mouth daily. 06/11/21  Yes Gottschalk, Leatrice Jewels M, DO  ?metFORMIN (GLUCOPHAGE-XR) 500 MG 24 hr tablet TAKE 2 TABLETS BY MOUTH TWICE A DAY WITH FOOD 01/25/21  Yes Ronnie Doss M, DO  ?mirtazapine (REMERON) 45 MG tablet Take 1 tablet (45 mg total) by mouth at bedtime. 06/11/21  Yes Ronnie Doss M, DO  ?omeprazole  (PRILOSEC) 20 MG capsule Take 20 mg by mouth daily.   Yes [provider]  ?rosuvastatin (CRESTOR) 20 MG tablet TAKE 1 TABLET BY MOUTH EVERYDAY AT BEDTIME 06/11/21  Yes Ronnie Doss M, DO  ?sildenafil (VIAGRA) 50 MG tablet Take 1 tablet (50 mg total) by mouth daily as needed for erectile dysfunction. 08/16/20  Yes [name], DO  ?Continuous Blood Gluc Sensor (FREESTYLE LIBRE 14 DAY SENSOR) MISC Scan to check glucose at least 4 times daily. DX: E11.4 06/21/21   Ronnie Doss M, DO  ?glucose blood (ONETOUCH VERIO) test strip Use as instructed to monitor glucose 4 times daily (use as back up method for CGM) 04/10/21   Brita Romp, NP  ?Insulin Pen Needle (B-D ULTRAFINE III SHORT PEN) 31G X 8 MM MISC Use to give insulin daily Dx E11.9 06/09/19   Baruch Gouty, FNP  ?Lancets (ONETOUCH ULTRASOFT) lancets Use as instructed to monitor glucose 4 times daily (use as back up method for CGM). 04/10/21   Brita Romp, NP  ?   ? ?Allergies    ?Patient has no known allergies.   ? ?Review of Systems   ?Review of Systems  ?Constitutional:  Negative for chills and fever.  ?HENT:  Negative for ear pain and sore throat.   ?Eyes:  Negative for pain and visual disturbance.  ?Respiratory:  Positive for shortness of breath. Negative for  cough.   ?Cardiovascular:  Positive for leg swelling. Negative for chest pain and palpitations.  ?Gastrointestinal:  Negative for abdominal pain and vomiting.  ?Genitourinary:  Negative for dysuria and hematuria.  ?Musculoskeletal:  Negative for arthralgias and back pain.  ?Skin:  Negative for color change and rash.  ?Neurological:  Negative for seizures and syncope.  ?All other systems reviewed and are negative. ? ?Physical Exam ?Updated Vital Signs ?BP (!) 155/78   Pulse 88   Temp 98.2 ?F (36.8 ?C) (Oral)   Resp (!) 25   Ht 1.829 m (6')   Wt (!) 145 kg   SpO2 98%   BMI 43.35 kg/m?  ?Physical Exam ?Vitals and nursing note reviewed.  ?Constitutional:   ?   General:  He is not in acute distress. ?   Appearance: Normal appearance. He is well-developed.  ?HENT:  ?   Head: Normocephalic and atraumatic.  ?Eyes:  ?   Extraocular Movements: Extraocular movements intact.  ?   Conjunctiva/sclera: Conjunctivae normal.  ?   Pupils: Pupils are equal, round, and reactive to light.  ?Cardiovascular:  ?   Rate and Rhythm: Normal rate and regular rhythm.  ?   Heart sounds: No murmur heard. ?Pulmonary:  ?   Effort: Pulmonary effort is normal. No respiratory distress.  ?   Breath sounds: Normal breath sounds. No wheezing, rhonchi or rales.  ?Chest:  ?   Chest wall: No tenderness.  ?Abdominal:  ?   Palpations: Abdomen is soft.  ?   Tenderness: There is no abdominal tenderness.  ?Musculoskeletal:     ?   General: No swelling.  ?   Cervical back: Normal range of motion and neck supple.  ?   Right lower leg: Edema present.  ?   Left lower leg: Edema present.  ?Skin: ?   General: Skin is warm and dry.  ?   Capillary Refill: Capillary refill takes less than 2 seconds.  ?Neurological:  ?   General: No focal deficit present.  ?   Mental Status: He is alert and oriented to person, place, and time.  ?   Cranial Nerves: No cranial nerve deficit.  ?   Sensory: No sensory deficit.  ?Psychiatric:     ?   Mood and Affect: Mood normal.  ? ? ?ED Results / Procedures / Treatments   ?Labs ?(all labs ordered are listed, but only abnormal results are displayed) ?Labs Reviewed  ?COMPREHENSIVE METABOLIC PANEL - Abnormal; Notable for the following components:  ?    Result Value  ? Glucose, Bld 154 (*)   ? Total Protein 8.2 (*)   ? All other components within normal limits  ?CBC WITH DIFFERENTIAL/PLATELET - Abnormal; Notable for the following components:  ? Hemoglobin 12.4 (*)   ? MCH 24.1 (*)   ? MCHC 29.7 (*)   ? RDW 18.6 (*)   ? All other components within normal limits  ?BRAIN NATRIURETIC PEPTIDE - Abnormal; Notable for the following components:  ? B Natriuretic Peptide 258.0 (*)   ? All other components within  normal limits  ?RESP PANEL BY RT-PCR (FLU A&B, COVID) [id number]  ? ? ?EKG ?None ? ?Radiology ?CT Angio Chest PE W/Cm &/Or Wo Cm ? ?Result Date: 10/29/2021 ?CLINICAL DATA:  2 day history of worsening shortness of breath. PE suspected. EXAM: CT ANGIOGRAPHY CHEST WITH CONTRAST TECHNIQUE: Multidetector CT imaging of the chest was performed using the standard protocol during bolus administration of intravenous contrast. Multiplanar CT image reconstructions and MIPs  were obtained to evaluate the vascular anatomy. RADIATION DOSE REDUCTION: This exam was performed according to the departmental dose-optimization program which includes automated exposure control, adjustment of the mA and/or kV according to patient size and/or use of iterative reconstruction technique. CONTRAST:  31m OMNIPAQUE IOHEXOL 350 MG/ML SOLN COMPARISON:  None. FINDINGS: Cardiovascular: Heart is enlarged. No substantial pericardial effusion. Coronary artery calcification is evident. Mild atherosclerotic calcification is noted in the wall of the thoracic aorta. There is no filling defect within the opacified pulmonary arteries to suggest the presence of an acute pulmonary embolus. Mediastinum/Nodes: 2.1 cm left thyroid nodule identified on image 13/series 4 mild mediastinal lymphadenopathy including 13 mm short axis AP window lymph node on 32/4 and 14 mm short axis subcarinal lymph node on 46/4. Prominent lymphoid tissue seen in both hilar regions. The esophagus has normal imaging features. There is no axillary lymphadenopathy. Lungs/Pleura: 5 mm right lower lobe pulmonary nodule identified on image 80/series 6. Subsegmental atelectasis seen in the lung bases bilaterally. 16 mm nodular opacity in the deep posterior left costophrenic sulcus is probably atelectatic. Tiny bilateral pleural effusions evident. Upper Abdomen: Unremarkable. Musculoskeletal: No worrisome lytic or sclerotic osseous abnormality. Review of the MIP images confirms the above findings.  IMPRESSION: 1. No CT evidence for acute pulmonary embolus. 2. 5 mm right lower lobe pulmonary nodule with 16 mm nodular opacity in the deep posterior left costophrenic sulcus. Follow-up CT chest in 3 months is recomme

## 2021-10-29 NOTE — Assessment & Plan Note (Signed)
Unknown time of onset, but new diagnosis ?Check 2D echocardiogram and TSH ?Monitor on telemetry and initiate rate control medications after evaluation of echocardiogram ?Initiate anticoagulation with Eliquis due Chads2Vasc of 3 ?Follow-up cardiology outpatient unless rate control becomes an issue inpatient ?

## 2021-10-29 NOTE — Assessment & Plan Note (Signed)
BNP elevated and patient noted to be volume overloaded ?Continue IV Lasix twice daily ?Strict I's and O's along with daily weights ?Claims to have baseline weight near 275 pounds ?Check 2D echocardiogram ?

## 2021-10-29 NOTE — Assessment & Plan Note (Signed)
Lifestyle changes outpatient ?

## 2021-10-29 NOTE — ED Notes (Signed)
Pt dropped to an SpO2 of 86% on room air, pt was placed back on 3 L of oxygen. MD made aware ?

## 2021-10-29 NOTE — Progress Notes (Signed)
ANTICOAGULATION CONSULT NOTE - Initial Consult ? ?Pharmacy Consult for Apixaban ?Indication: atrial fibrillation ? ?No Known Allergies ? ?Patient Measurements: ?Height: 6' (182.9 cm) ?Weight: (!) 145 kg (319 lb 9.6 oz) ?IBW/kg (Calculated) : 77.6 ? ? ?Vital Signs: ?Temp: 98.2 ?F (36.8 ?C) (03/27 0715) ?Temp Source: Oral (03/27 0715) ?BP: 161/92 (03/27 1000) ?Pulse Rate: 77 (03/27 1000) ? ?Labs: ?Recent Labs  ?  10/29/21 ?0810  ?HGB 12.4*  ?HCT 41.7  ?PLT 207  ?CREATININE 0.91  ? ? ?Estimated Creatinine Clearance: 110.2 mL/min (by C-G formula based on SCr of 0.91 mg/dL). ? ? ?Medical History: ?Past Medical History:  ?Diagnosis Date  ? Anxiety   ? Arthritis   ? Diabetes mellitus without complication ([place])   ? GERD (gastroesophageal reflux disease)   ? Hypercholesteremia   ? Hypertension   ? ? ? ?Assessment: ?71 year old obese male admitted with SOB, pharmacy consulted to initiate apixaban for non-valvular Afib in anticoag-naive patient.  ? ?Goal of Therapy:  ?Monitor platelets by anticoagulation protocol: Yes ?  ?Plan:  ?Apixaban '5mg'$  po BID ?Monitor CBC and s/sx bleeding ? ?Lorenso Courier, PharmD ?Clinical Pharmacist ?10/29/2021 11:16 AM ? ? ? ? ? ? ? ?

## 2021-10-29 NOTE — Assessment & Plan Note (Signed)
-   Continue amlodipine ?

## 2021-10-29 NOTE — ED Notes (Signed)
While the pt got up to use the urinal pt was very short of breath. Oxygen Saturation 92% on 3L. RN notified. ?

## 2021-10-29 NOTE — Assessment & Plan Note (Signed)
Continue home Xanax and Remeron ?

## 2021-10-29 NOTE — ED Triage Notes (Signed)
Pt arrives via [facility] c/o SOB x 2 days,worse this morning. No Hx of CHF, but per EMS pt has pitting edema in lower extremities.  ?

## 2021-10-30 ENCOUNTER — Observation Stay (HOSPITAL_COMMUNITY): Payer: Medicare Other

## 2021-10-30 DIAGNOSIS — E876 Hypokalemia: Secondary | ICD-10-CM | POA: Diagnosis present

## 2021-10-30 DIAGNOSIS — R591 Generalized enlarged lymph nodes: Secondary | ICD-10-CM | POA: Diagnosis present

## 2021-10-30 DIAGNOSIS — I4891 Unspecified atrial fibrillation: Secondary | ICD-10-CM

## 2021-10-30 DIAGNOSIS — Z20822 Contact with and (suspected) exposure to covid-19: Secondary | ICD-10-CM | POA: Diagnosis present

## 2021-10-30 DIAGNOSIS — R0602 Shortness of breath: Secondary | ICD-10-CM | POA: Diagnosis present

## 2021-10-30 DIAGNOSIS — Z833 Family history of diabetes mellitus: Secondary | ICD-10-CM | POA: Diagnosis not present

## 2021-10-30 DIAGNOSIS — I509 Heart failure, unspecified: Secondary | ICD-10-CM | POA: Diagnosis not present

## 2021-10-30 DIAGNOSIS — Z961 Presence of intraocular lens: Secondary | ICD-10-CM | POA: Diagnosis present

## 2021-10-30 DIAGNOSIS — Z79890 Hormone replacement therapy (postmenopausal): Secondary | ICD-10-CM | POA: Diagnosis not present

## 2021-10-30 DIAGNOSIS — K219 Gastro-esophageal reflux disease without esophagitis: Secondary | ICD-10-CM | POA: Diagnosis present

## 2021-10-30 DIAGNOSIS — F411 Generalized anxiety disorder: Secondary | ICD-10-CM | POA: Diagnosis present

## 2021-10-30 DIAGNOSIS — Z79899 Other long term (current) drug therapy: Secondary | ICD-10-CM | POA: Diagnosis not present

## 2021-10-30 DIAGNOSIS — R911 Solitary pulmonary nodule: Secondary | ICD-10-CM | POA: Diagnosis present

## 2021-10-30 DIAGNOSIS — E041 Nontoxic single thyroid nodule: Secondary | ICD-10-CM | POA: Diagnosis present

## 2021-10-30 DIAGNOSIS — Z7985 Long-term (current) use of injectable non-insulin antidiabetic drugs: Secondary | ICD-10-CM | POA: Diagnosis not present

## 2021-10-30 DIAGNOSIS — R0902 Hypoxemia: Secondary | ICD-10-CM | POA: Diagnosis present

## 2021-10-30 DIAGNOSIS — E7800 Pure hypercholesterolemia, unspecified: Secondary | ICD-10-CM | POA: Diagnosis present

## 2021-10-30 DIAGNOSIS — E1169 Type 2 diabetes mellitus with other specified complication: Secondary | ICD-10-CM | POA: Diagnosis present

## 2021-10-30 DIAGNOSIS — Z6841 Body Mass Index (BMI) 40.0 and over, adult: Secondary | ICD-10-CM | POA: Diagnosis not present

## 2021-10-30 DIAGNOSIS — I5033 Acute on chronic diastolic (congestive) heart failure: Secondary | ICD-10-CM | POA: Diagnosis present

## 2021-10-30 DIAGNOSIS — M1990 Unspecified osteoarthritis, unspecified site: Secondary | ICD-10-CM | POA: Diagnosis present

## 2021-10-30 DIAGNOSIS — I110 Hypertensive heart disease with heart failure: Secondary | ICD-10-CM | POA: Diagnosis present

## 2021-10-30 DIAGNOSIS — Z87891 Personal history of nicotine dependence: Secondary | ICD-10-CM | POA: Diagnosis not present

## 2021-10-30 DIAGNOSIS — Z794 Long term (current) use of insulin: Secondary | ICD-10-CM | POA: Diagnosis not present

## 2021-10-30 DIAGNOSIS — I152 Hypertension secondary to endocrine disorders: Secondary | ICD-10-CM | POA: Diagnosis present

## 2021-10-30 LAB — BASIC METABOLIC PANEL
Anion gap: 9 (ref 5–15)
BUN: 16 mg/dL (ref 8–23)
CO2: 28 mmol/L (ref 22–32)
Calcium: 9 mg/dL (ref 8.9–10.3)
Chloride: 103 mmol/L (ref 98–111)
Creatinine, Ser: 0.81 mg/dL (ref 0.61–1.24)
GFR, Estimated: >60 mL/min (ref >60)
Glucose, Bld: 145 mg/dL — ABNORMAL HIGH (ref 70–99)
Potassium: 3.4 mmol/L — ABNORMAL LOW (ref 3.5–5.1)
Sodium: 140 mmol/L (ref 135–145)

## 2021-10-30 LAB — ECHOCARDIOGRAM COMPLETE
Area-P 1/2: 3.65 cm2
Calc EF: 64.2 %
Height: 72 in
S' Lateral: 4.3 cm
Single Plane A2C EF: 62.2 %
Single Plane A4C EF: 62.9 %
Weight: 4772.52 oz

## 2021-10-30 LAB — GLUCOSE, CAPILLARY
Glucose-Capillary: 168 mg/dL — ABNORMAL HIGH (ref 70–99)
Glucose-Capillary: 253 mg/dL — ABNORMAL HIGH (ref 70–99)
Glucose-Capillary: 211 mg/dL — ABNORMAL HIGH (ref 70–99)
Glucose-Capillary: 227 mg/dL — ABNORMAL HIGH (ref 70–99)

## 2021-10-30 LAB — CBC
HCT: 40.8 % (ref 39.0–52.0)
Hemoglobin: 12.4 g/dL — ABNORMAL LOW (ref 13.0–17.0)
MCH: 24.8 pg — ABNORMAL LOW (ref 26.0–34.0)
MCHC: 30.4 g/dL (ref 30.0–36.0)
MCV: 81.6 fL (ref 80.0–100.0)
Platelets: 220 10*3/uL (ref 150–400)
RBC: 5 MIL/uL (ref 4.22–5.81)
RDW: 18.4 % — ABNORMAL HIGH (ref 11.5–15.5)
WBC: 6.5 10*3/uL (ref 4.0–10.5)
nRBC: 0 % (ref 0.0–0.2)

## 2021-10-30 LAB — MAGNESIUM: Magnesium: 1.7 mg/dL (ref 1.7–2.4)

## 2021-10-30 MED ORDER — PERFLUTREN LIPID MICROSPHERE
1.0000 mL | INTRAVENOUS | Status: AC | PRN
  Administered 2021-10-30: 2 mL via INTRAVENOUS

## 2021-10-30 MED ORDER — MAGNESIUM SULFATE IN D5W 1-5 GM/100ML-% IV SOLN
1.0000 g | Freq: Once | INTRAVENOUS | Status: AC
  Administered 2021-10-30: 1 g via INTRAVENOUS
  Filled 2021-10-30 (×2): qty 100

## 2021-10-30 MED ORDER — MAGNESIUM SULFATE IN D5W 1-5 GM/100ML-% IV SOLN
1.0000 g | Freq: Once | INTRAVENOUS | Status: DC
  Filled 2021-10-30: qty 100

## 2021-10-30 MED ORDER — POTASSIUM CHLORIDE CRYS ER 20 MEQ PO TBCR
40.0000 meq | EXTENDED_RELEASE_TABLET | Freq: Two times a day (BID) | ORAL | Status: DC
  Administered 2021-10-30 – 2021-11-01 (×5): 40 meq via ORAL
  Filled 2021-10-30 (×5): qty 2

## 2021-10-30 NOTE — Progress Notes (Signed)
?PROGRESS NOTE ? ? ? ?[name]  EUM:[id number] DOB: 1950-08-16 DOA: 10/29/2021 ?PCP: [name], DO ? ? ?Brief Narrative:  ?Per HPI: ?[name] is a 71 y.o. male with medical history significant of obesity, type 2 diabetes, hypertension, dyslipidemia, GERD, and anxiety who presented to the ED with complaints of shortness of breath that began approximately 2 days ago.  It was noted to be worse this morning and he denies any specific orthopnea or dyspnea on exertion.  He says that his lower extremities have been swelling in the last 2 days.  He does admit to increased sodium intake over the last 2-3 days.  He also states that he has had a mild, nonproductive cough.  No fevers or chills noted.  No sick contacts.  He has been taking his home medications as otherwise prescribed and states that he quit smoking over 10 years ago.  He denies any current alcohol use. ? ?3/28: Patient has been admitted for acute CHF and has been started on IV diuresis with over 5 L of urine output overnight.  TSH within normal limits and heart rates are stable.  2D echocardiogram ordered and pending.  ? ? ?Assessment & Plan: ?  ?Principal Problem: ?  Acute CHF ([place]) ?Active Problems: ?  Diabetes mellitus ([place]) ?  Gastroesophageal reflux disease without esophagitis ?  Hypertension associated with diabetes ([place]) ?  GAD (generalized anxiety disorder) ?  Morbid obesity ([place]) ?  Atrial fibrillation ([place]) ? ?Assessment and Plan: ?* Acute CHF ([place]) ?BNP elevated and patient noted to be volume overloaded ?Continue IV Lasix twice daily ?Strict I's and O's along with daily weights ?Claims to have baseline weight near 275 pounds ?Check 2D echocardiogram ? ?Atrial fibrillation ([place]) ?Unknown time of onset, but new diagnosis ?Check 2D echocardiogram and TSH ?Monitor on telemetry and initiate rate control medications after evaluation of echocardiogram ?Initiate anticoagulation with Eliquis due Chads2Vasc of 3 ?Follow-up cardiology outpatient  unless rate control becomes an issue inpatient ? ?Morbid obesity ([place]) ?Lifestyle changes outpatient ? ?GAD (generalized anxiety disorder) ?Continue home Xanax and Remeron ? ?Hypertension associated with diabetes ([place]) ?Continue amlodipine ? ?Gastroesophageal reflux disease without esophagitis ?Continue PPI ? ?Diabetes mellitus ([place]) ?Hold home oral agents and continue on SSI ?Carb modified diet ? ? ?Hypokalemia ?Replete and reevaluate ? ? DVT prophylaxis: Eliquis ?Code Status: Full ?Family Communication: Daughter at bedside 3/27 ?Disposition Plan:  ?Status is: Observation ?The patient will require care spanning > 2 midnights and should be moved to inpatient because: Ongoing need for IV diuresis ? ?Consultants:  ?None ? ?Procedures:  ?See below ? ?Antimicrobials:  ?None ? ? ?Subjective: ?Patient seen and evaluated today with no new acute complaints or concerns. No acute concerns or events noted overnight.  He appears to be diuresing quite well with over 5-6 L of diuresis overnight.  Continues to have some ongoing edema. ? ?Objective: ?Vitals:  ? 10/30/21 0014 10/30/21 0500 10/30/21 0552 10/30/21 0830  ?BP: (!) 142/90  (!) 147/73   ?Pulse: 62  67   ?Resp: 20  20   ?Temp: 98.8 ?F (37.1 ?C)  98.3 ?F (36.8 ?C)   ?TempSrc: Oral  Oral   ?SpO2: 100%  100% 100%  ?Weight:  135.3 kg    ?Height:      ? ? ?Intake/Output Summary (Last 24 hours) at 10/30/2021 0949 ?Last data filed at 10/30/2021 5400 ?Gross per 24 hour  ?Intake 240 ml  ?Output 6800 ml  ?Net -6560 ml  ? ?Filed  Weights  ? 10/29/21 0715 10/29/21 1211 10/30/21 0500  ?Weight: (!) 145 kg (!) 139.1 kg 135.3 kg  ? ? ?Examination: ? ?General exam: Appears calm and comfortable, obese ?Respiratory system: Clear to auscultation. Respiratory effort normal.  Nasal cannula oxygen ?Cardiovascular system: S1 & S2 heard, RRR.  ?Gastrointestinal system: Abdomen is soft ?Central nervous system: Alert and awake ?Extremities: Bilateral pitting lower extremity edema ?Skin: No significant  lesions noted ?Psychiatry: Flat affect. ? ? ? ?Data Reviewed: I have personally reviewed following labs and imaging studies ? ?CBC: ?Recent Labs  ?Lab 10/29/21 ?0810 10/30/21 ?5638  ?WBC 6.6 6.5  ?NEUTROABS 4.6  --   ?HGB 12.4* 12.4*  ?HCT 41.7 40.8  ?MCV 81.0 81.6  ?PLT 207 220  ? ?Basic Metabolic Panel: ?Recent Labs  ?Lab 10/29/21 ?0810 10/30/21 ?7564  ?NA 141 140  ?K 4.3 3.4*  ?CL 106 103  ?CO2 28 28  ?GLUCOSE 154* 145*  ?BUN 17 16  ?CREATININE 0.91 0.81  ?CALCIUM 9.0 9.0  ?MG  --  1.7  ? ?GFR: ?Estimated Creatinine Clearance: 119.1 mL/min (by C-G formula based on SCr of 0.81 mg/dL). ?Liver Function Tests: ?Recent Labs  ?Lab 10/29/21 ?0810  ?AST 24  ?ALT 17  ?ALKPHOS 87  ?BILITOT 0.8  ?PROT 8.2*  ?ALBUMIN 3.7  ? ?No results for input(s): LIPASE, AMYLASE in the last 168 hours. ?No results for input(s): AMMONIA in the last 168 hours. ?Coagulation Profile: ?No results for input(s): INR, PROTIME in the last 168 hours. ?Cardiac Enzymes: ?No results for input(s): CKTOTAL, CKMB, CKMBINDEX, TROPONINI in the last 168 hours. ?BNP (last 3 results) ?No results for input(s): PROBNP in the last 8760 hours. ?HbA1C: ?Recent Labs  ?  10/29/21 ?0810  ?HGBA1C 8.1*  ? ?CBG: ?Recent Labs  ?Lab 10/29/21 ?1155 10/29/21 ?1612 10/29/21 ?2146 10/30/21 ?0727  ?GLUCAP 153* 195* 172* 168*  ? ?Lipid Profile: ?No results for input(s): CHOL, HDL, LDLCALC, TRIG, CHOLHDL, LDLDIRECT in the last 72 hours. ?Thyroid Function Tests: ?Recent Labs  ?  10/29/21 ?0810  ?TSH 0.663  ? ?Anemia Panel: ?Recent Labs  ?  10/29/21 ?0810 10/29/21 ?1153  ?[id number] 122*  --   ?FOLATE 12.8  --   ?FERRITIN 30  --   ?TIBC 405  --   ?IRON 43*  --   ?RETICCTPCT  --  1.8  ? ?Sepsis Labs: ?No results for input(s): PROCALCITON, LATICACIDVEN in the last 168 hours. ? ?Recent Results (from the past 240 hour(s))  ?Resp Panel by RT-PCR (Flu A&B, Covid) Nasopharyngeal Swab     Status: None  ? Collection Time: 10/29/21 10:09 AM  ? Specimen: Nasopharyngeal Swab;  Nasopharyngeal(NP) swabs in vial transport medium  ?Result Value Ref Range Status  ? SARS Coronavirus 2 by RT PCR NEGATIVE NEGATIVE Final  ?  Comment: (NOTE) ?SARS-CoV-2 target nucleic acids are NOT DETECTED. ? ?The SARS-CoV-2 RNA is generally detectable in upper respiratory ?specimens during the acute phase of infection. The lowest ?concentration of SARS-CoV-2 viral copies this assay can detect is ?138 copies/mL. A negative result does not preclude SARS-Cov-2 ?infection and should not be used as the sole basis for treatment or ?other patient management decisions. A negative result may occur with  ?improper specimen collection/handling, submission of specimen other ?than nasopharyngeal swab, presence of viral mutation(s) within the ?areas targeted by this assay, and inadequate number of viral ?copies(<138 copies/mL). A negative result must be combined with ?clinical observations, patient history, and epidemiological ?information. The expected result is Negative. ? ?Fact Sheet for  Patients:  ?EntrepreneurPulse.com.au ? ?Fact Sheet for Healthcare Providers:  ?IncredibleEmployment.be ? ?This test is no t yet approved or cleared by the [place] FDA and  ?has been authorized for detection and/or diagnosis of SARS-CoV-2 by ?FDA under an Emergency Use Authorization (EUA). This EUA will remain  ?in effect (meaning this test can be used) for the duration of the ?COVID-19 declaration under Section 564(b)(1) of the Act, 21 ?U.S.C.section 360bbb-3(b)(1), unless the authorization is terminated  ?or revoked sooner.  ? ? ?  ? Influenza A by PCR NEGATIVE NEGATIVE Final  ? Influenza B by PCR NEGATIVE NEGATIVE Final  ?  Comment: (NOTE) ?The Xpert Xpress SARS-CoV-2/FLU/RSV plus assay is intended as an aid ?in the diagnosis of influenza from Nasopharyngeal swab specimens and ?should not be used as a sole basis for treatment. Nasal washings and ?aspirates are unacceptable for Xpert Xpress  SARS-CoV-2/FLU/RSV ?testing. ? ?Fact Sheet for Patients: ?EntrepreneurPulse.com.au ? ?Fact Sheet for Healthcare Providers: ?IncredibleEmployment.be ? ?This test is not yet approved or cleare

## 2021-10-30 NOTE — Progress Notes (Signed)
?  Transition of Care (TOC) Screening Note ? ? ?Patient Details  ?Name: [name] ?Date of Birth: 05-15-1951 ? ? ?Transition of Care (TOC) CM/SW Contact:    ?Iona Beard, LCSWA ?Phone Number: ?10/30/2021, 10:55 AM ? ? ? ?Transition of Care Department [facility]) has reviewed patient and no TOC needs have been identified at this time. We will continue to monitor patient advancement through interdisciplinary progression rounds. If new patient transition needs arise, please place a TOC consult. ?  ?

## 2021-10-30 NOTE — Progress Notes (Incomplete)
?  Echocardiogram ?2D Echocardiogram has been performed. ? ?[name] ?10/30/2021, 9:23 AM ?

## 2021-10-30 NOTE — Hospital Course (Addendum)
Per HPI: ?[name] is a 71 y.o. male with medical history significant of obesity, type 2 diabetes, hypertension, dyslipidemia, GERD, and anxiety who presented to the ED with complaints of shortness of breath that began approximately 2 days ago.  It was noted to be worse this morning and he denies any specific orthopnea or dyspnea on exertion.  He says that his lower extremities have been swelling in the last 2 days.  He does admit to increased sodium intake over the last 2-3 days.  He also states that he has had a mild, nonproductive cough.  No fevers or chills noted.  No sick contacts.  He has been taking his home medications as otherwise prescribed and states that he quit smoking over 10 years ago.  He denies any current alcohol use. ? ?3/28: Patient has been admitted for acute CHF and has been started on IV diuresis with over 5 L of urine output overnight.  TSH within normal limits and heart rates are stable.  2D echocardiogram ordered and pending. ? ?3/29: 2D echocardiogram with preserved LV function noted and no other significant abnormalities.  Heart rate has remained stable.  He continues to have significant urine output over 2.5 L over the last 24 hours.  He continues to have some mild signs of volume overload with plans to continue IV Lasix through today and anticipate discharge in the next 24 hours. ?

## 2021-10-30 NOTE — Progress Notes (Signed)
Patient is pleasant and sitting on the side of the bed. No complaints at this time.  ?

## 2021-10-31 ENCOUNTER — Telehealth: Payer: Medicare Other

## 2021-10-31 LAB — BASIC METABOLIC PANEL
Anion gap: 7 (ref 5–15)
BUN: 16 mg/dL (ref 8–23)
CO2: 27 mmol/L (ref 22–32)
Calcium: 9 mg/dL (ref 8.9–10.3)
Chloride: 104 mmol/L (ref 98–111)
Creatinine, Ser: 0.84 mg/dL (ref 0.61–1.24)
GFR, Estimated: >60 mL/min (ref >60)
Glucose, Bld: 178 mg/dL — ABNORMAL HIGH (ref 70–99)
Potassium: 3.5 mmol/L (ref 3.5–5.1)
Sodium: 138 mmol/L (ref 135–145)

## 2021-10-31 LAB — MAGNESIUM: Magnesium: 1.8 mg/dL (ref 1.7–2.4)

## 2021-10-31 LAB — GLUCOSE, CAPILLARY
Glucose-Capillary: 179 mg/dL — ABNORMAL HIGH (ref 70–99)
Glucose-Capillary: 227 mg/dL — ABNORMAL HIGH (ref 70–99)
Glucose-Capillary: 190 mg/dL — ABNORMAL HIGH (ref 70–99)
Glucose-Capillary: 158 mg/dL — ABNORMAL HIGH (ref 70–99)

## 2021-10-31 MED ORDER — MAGNESIUM SULFATE IN D5W 1-5 GM/100ML-% IV SOLN
1.0000 g | Freq: Once | INTRAVENOUS | Status: AC
  Administered 2021-10-31: 1 g via INTRAVENOUS
  Filled 2021-10-31: qty 100

## 2021-10-31 MED ORDER — INSULIN ASPART 100 UNIT/ML IJ SOLN
3.0000 [IU] | Freq: Three times a day (TID) | INTRAMUSCULAR | Status: DC
  Administered 2021-10-31 – 2021-11-01 (×3): 3 [IU] via SUBCUTANEOUS

## 2021-10-31 NOTE — Progress Notes (Signed)
Inpatient Diabetes Program Recommendations ? ?AACE/ADA: New Consensus Statement on Inpatient Glycemic Control (2015) ? ?Target Ranges:  Prepandial:   less than 140 mg/dL ?     Peak postprandial:   less than 180 mg/dL (1-2 hours) ?     Critically ill patients:  140 - 180 mg/dL  ? ?Lab Results  ?Component Value Date  ? GLUCAP 179 (H) 10/31/2021  ? HGBA1C 8.1 (H) 10/29/2021  ? ? ?Review of Glycemic Control ? Latest Reference Range & Units 10/30/21 07:27 10/30/21 11:15 10/30/21 16:07 10/30/21 21:40 10/31/21 07:04  ?Glucose-Capillary 70 - 99 mg/dL 168 (H) 253 (H) 211 (H) 227 (H) 179 (H)  ?(H): Data is abnormally high ? ?Diabetes history: DM2 ?Outpatient Diabetes medications: Treasiba 60 units QAM, Novolog 10-16 units TID, Metformin XR 1000 mg BID ?Current orders for Inpatient glycemic control: Semglee 30 units QHS, Novolog 0-15 units TID and 0-5 units QHS ? ?Inpatient Diabetes Program Recommendations:   ? ?Please consider: ? ?Novolog 3 units TID with meals if consumes at least 50%. ? ?Will continue to follow while inpatient. ? ?Thank you, ?Reche Dixon, MSN, RN ?Diabetes Coordinator ?Inpatient Diabetes Program ?[phone] (team pager from 8a-5p) ? ? ? ?

## 2021-10-31 NOTE — Progress Notes (Signed)
?PROGRESS NOTE ? ? ? ?[name]  VOZ:[id number] DOB: 1950-10-15 DOA: 10/29/2021 ?PCP: [name], DO ? ? ?Brief Narrative:  ?Per HPI: ?Charles SEBEK is a 71 y.o. male with medical history significant of obesity, type 2 diabetes, hypertension, dyslipidemia, GERD, and anxiety who presented to the ED with complaints of shortness of breath that began approximately 2 days ago.  It was noted to be worse this morning and he denies any specific orthopnea or dyspnea on exertion.  He says that his lower extremities have been swelling in the last 2 days.  He does admit to increased sodium intake over the last 2-3 days.  He also states that he has had a mild, nonproductive cough.  No fevers or chills noted.  No sick contacts.  He has been taking his home medications as otherwise prescribed and states that he quit smoking over 10 years ago.  He denies any current alcohol use. ? ?3/28: Patient has been admitted for acute CHF and has been started on IV diuresis with over 5 L of urine output overnight.  TSH within normal limits and heart rates are stable.  2D echocardiogram ordered and pending. ? ?3/29: 2D echocardiogram with preserved LV function noted and no other significant abnormalities.  Heart rate has remained stable.  He continues to have significant urine output over 2.5 L over the last 24 hours.  He continues to have some mild signs of volume overload with plans to continue IV Lasix through today and anticipate discharge in the next 24 hours.  ? ? ?Assessment & Plan: ?  ?Principal Problem: ?  Acute CHF ([place]) ?Active Problems: ?  Diabetes mellitus ([place]) ?  Gastroesophageal reflux disease without esophagitis ?  Hypertension associated with diabetes ([place]) ?  GAD (generalized anxiety disorder) ?  Morbid obesity ([place]) ?  Atrial fibrillation ([place]) ? ?Assessment and Plan: ? ? ?Acute CHF ([place]) ?Acute diastolic CHF exacerbation ?BNP elevated and patient noted to be volume overloaded ?Continue IV Lasix twice daily ?Strict  I's and O's along with daily weights ?Claims to have baseline weight near 275 pounds ?2D echocardiogram with preserved LV function ?  ?Atrial fibrillation ([place]) ?Unknown time of onset, but new diagnosis ?Check 2D echocardiogram and TSH ?Monitor on telemetry and initiate rate control medications after evaluation of echocardiogram ?Initiate anticoagulation with Eliquis due Chads2Vasc of 3 ?Follow-up cardiology outpatient unless rate control becomes an issue inpatient ?  ?Morbid obesity ([place]) ?Lifestyle changes outpatient ?  ?GAD (generalized anxiety disorder) ?Continue home Xanax and Remeron ?  ?Hypertension associated with diabetes ([place]) ?Continue amlodipine ?  ?Gastroesophageal reflux disease without esophagitis ?Continue PPI ?  ?Diabetes mellitus ([place]) ?Hold home oral agents and continue on SSI ?Carb modified diet ?  ? DVT prophylaxis: Eliquis ?Code Status: Full ?Family Communication: Daughter at bedside 3/28 ?Disposition Plan:  ?Status is: Inpatient ?Remains inpatient appropriate because: Ongoing need for IV medications. ? ?  ?Consultants:  ?None ?  ?Procedures:  ?See below ?  ?Antimicrobials:  ?None ?  ? Subjective: ?Patient seen and evaluated today with no new acute complaints or concerns. No acute concerns or events noted overnight.  He is overall doing quite well and no longer short of breath.  He continues to have some ankle edema. ? ?Objective: ?Vitals:  ? 10/30/21 2026 10/31/21 0500 10/31/21 0535 10/31/21 0834  ?BP: 112/74  (!) 144/69 134/86  ?Pulse: 76  64   ?Resp: 16  18   ?Temp: 98 ?F (36.7 ?C)  97.8 ?F (36.6 ?  C)   ?TempSrc: Oral  Oral   ?SpO2: 97%  93%   ?Weight:  133.3 kg    ?Height:      ? ? ?Intake/Output Summary (Last 24 hours) at 10/31/2021 1141 ?Last data filed at 10/31/2021 0900 ?Gross per 24 hour  ?Intake 814.82 ml  ?Output 2725 ml  ?Net -1910.18 ml  ? ?Filed Weights  ? 10/29/21 1211 10/30/21 0500 10/31/21 0500  ?Weight: (!) 139.1 kg 135.3 kg 133.3 kg  ? ? ?Examination: ? ?General exam: Appears  calm and comfortable  ?Respiratory system: Clear to auscultation. Respiratory effort normal. ?Cardiovascular system: S1 & S2 heard, RRR.  ?Gastrointestinal system: Abdomen is soft ?Central nervous system: Alert and awake ?Extremities: Bilateral pitting ankle edema noted, to mid shin ?Skin: No significant lesions noted ?Psychiatry: Flat affect. ? ? ? ?Data Reviewed: I have personally reviewed following labs and imaging studies ? ?CBC: ?Recent Labs  ?Lab 10/29/21 ?0810 10/30/21 ?1601  ?WBC 6.6 6.5  ?NEUTROABS 4.6  --   ?HGB 12.4* 12.4*  ?HCT 41.7 40.8  ?MCV 81.0 81.6  ?PLT 207 220  ? ?Basic Metabolic Panel: ?Recent Labs  ?Lab 10/29/21 ?0810 10/30/21 ?0932 10/31/21 ?0532  ?NA 141 140 138  ?K 4.3 3.4* 3.5  ?CL 106 103 104  ?CO2 '28 28 27  '$ ?GLUCOSE 154* 145* 178*  ?BUN '17 16 16  '$ ?CREATININE 0.91 0.81 0.84  ?CALCIUM 9.0 9.0 9.0  ?MG  --  1.7 1.8  ? ?GFR: ?Estimated Creatinine Clearance: 114 mL/min (by C-G formula based on SCr of 0.84 mg/dL). ?Liver Function Tests: ?Recent Labs  ?Lab 10/29/21 ?0810  ?AST 24  ?ALT 17  ?ALKPHOS 87  ?BILITOT 0.8  ?PROT 8.2*  ?ALBUMIN 3.7  ? ?No results for input(s): LIPASE, AMYLASE in the last 168 hours. ?No results for input(s): AMMONIA in the last 168 hours. ?Coagulation Profile: ?No results for input(s): INR, PROTIME in the last 168 hours. ?Cardiac Enzymes: ?No results for input(s): CKTOTAL, CKMB, CKMBINDEX, TROPONINI in the last 168 hours. ?BNP (last 3 results) ?No results for input(s): PROBNP in the last 8760 hours. ?HbA1C: ?Recent Labs  ?  10/29/21 ?0810  ?HGBA1C 8.1*  ? ?CBG: ?Recent Labs  ?Lab 10/30/21 ?1115 10/30/21 ?1607 10/30/21 ?2140 10/31/21 ?0704 10/31/21 ?1100  ?GLUCAP 253* 211* 227* 179* 227*  ? ?Lipid Profile: ?No results for input(s): CHOL, HDL, LDLCALC, TRIG, CHOLHDL, LDLDIRECT in the last 72 hours. ?Thyroid Function Tests: ?Recent Labs  ?  10/29/21 ?0810  ?TSH 0.663  ? ?Anemia Panel: ?Recent Labs  ?  10/29/21 ?0810 10/29/21 ?1153  ?[id number] 122*  --   ?FOLATE 12.8  --    ?FERRITIN 30  --   ?TIBC 405  --   ?IRON 43*  --   ?RETICCTPCT  --  1.8  ? ?Sepsis Labs: ?No results for input(s): PROCALCITON, LATICACIDVEN in the last 168 hours. ? ?Recent Results (from the past 240 hour(s))  ?Resp Panel by RT-PCR (Flu A&B, Covid) Nasopharyngeal Swab     Status: None  ? Collection Time: 10/29/21 10:09 AM  ? Specimen: Nasopharyngeal Swab; Nasopharyngeal(NP) swabs in vial transport medium  ?Result Value Ref Range Status  ? SARS Coronavirus 2 by RT PCR NEGATIVE NEGATIVE Final  ?  Comment: (NOTE) ?SARS-CoV-2 target nucleic acids are NOT DETECTED. ? ?The SARS-CoV-2 RNA is generally detectable in upper respiratory ?specimens during the acute phase of infection. The lowest ?concentration of SARS-CoV-2 viral copies this assay can detect is ?138 copies/mL. A negative result does not preclude SARS-Cov-2 ?  infection and should not be used as the sole basis for treatment or ?other patient management decisions. A negative result may occur with  ?improper specimen collection/handling, submission of specimen other ?than nasopharyngeal swab, presence of viral mutation(s) within the ?areas targeted by this assay, and inadequate number of viral ?copies(<138 copies/mL). A negative result must be combined with ?clinical observations, patient history, and epidemiological ?information. The expected result is Negative. ? ?Fact Sheet for Patients:  ?EntrepreneurPulse.com.au ? ?Fact Sheet for Healthcare Providers:  ?IncredibleEmployment.be ? ?This test is no t yet approved or cleared by the [place] FDA and  ?has been authorized for detection and/or diagnosis of SARS-CoV-2 by ?FDA under an Emergency Use Authorization (EUA). This EUA will remain  ?in effect (meaning this test can be used) for the duration of the ?COVID-19 declaration under Section 564(b)(1) of the Act, 21 ?U.S.C.section 360bbb-3(b)(1), unless the authorization is terminated  ?or revoked sooner.  ? ? ?  ? Influenza A  by PCR NEGATIVE NEGATIVE Final  ? Influenza B by PCR NEGATIVE NEGATIVE Final  ?  Comment: (NOTE) ?The Xpert Xpress SARS-CoV-2/FLU/RSV plus assay is intended as an aid ?in the diagnosis of influenza from N

## 2021-11-01 LAB — BASIC METABOLIC PANEL
Anion gap: 10 (ref 5–15)
BUN: 19 mg/dL (ref 8–23)
CO2: 25 mmol/L (ref 22–32)
Calcium: 9.1 mg/dL (ref 8.9–10.3)
Chloride: 101 mmol/L (ref 98–111)
Creatinine, Ser: 0.89 mg/dL (ref 0.61–1.24)
GFR, Estimated: >60 mL/min (ref >60)
Glucose, Bld: 164 mg/dL — ABNORMAL HIGH (ref 70–99)
Potassium: 3.8 mmol/L (ref 3.5–5.1)
Sodium: 136 mmol/L (ref 135–145)

## 2021-11-01 LAB — GLUCOSE, CAPILLARY: Glucose-Capillary: 186 mg/dL — ABNORMAL HIGH (ref 70–99)

## 2021-11-01 LAB — MAGNESIUM: Magnesium: 2.1 mg/dL (ref 1.7–2.4)

## 2021-11-01 MED ORDER — FUROSEMIDE 20 MG PO TABS: 20.0000 mg | ORAL_TABLET | Freq: Every day | ORAL | 11 refills | Status: DC

## 2021-11-01 MED ORDER — POTASSIUM CHLORIDE CRYS ER 20 MEQ PO TBCR: 20.0000 meq | EXTENDED_RELEASE_TABLET | Freq: Every day | ORAL | 2 refills | Status: DC

## 2021-11-01 MED ORDER — APIXABAN 5 MG PO TABS: 5.0000 mg | ORAL_TABLET | Freq: Two times a day (BID) | ORAL | 2 refills | Status: DC

## 2021-11-01 NOTE — Progress Notes (Signed)
Nsg Discharge Note ? ?Admit Date:  10/29/2021 ?Discharge date: 11/01/2021 ?  ?[name] to be D/C'd Home per MD order.  AVS completed.   ?Removed IV-CDI. Reviewed d/c paperwork with patient and daughter. Answered all questions. Wheeled stable patient and belongings to main entrance where he was picked up by his daughter to d/c to home. Copy for chart, and copy for patient signed, and dated. ?Patient/caregiver able to verbalize understanding. ? ?Discharge Medication: ?Allergies as of 11/01/2021   ?No Known Allergies ?  ? ?  ?Medication List  ?  ? ?TAKE these medications   ? ?ALPRAZolam 1 MG tablet ?Commonly known as: Duanne Moron ?Take 0.5-1 tablets (0.5-1 mg total) by mouth 3 (three) times daily as needed for anxiety (take ONLY IF NEEDED). ?  ?amLODipine 5 MG tablet ?Commonly known as: NORVASC ?Take 1 tablet (5 mg total) by mouth daily. ?  ?apixaban 5 MG Tabs tablet ?Commonly known as: ELIQUIS ?Take 1 tablet (5 mg total) by mouth 2 (two) times daily. ?  ?B-D ULTRAFINE III SHORT PEN 31G X 8 MM Misc ?Generic drug: Insulin Pen Needle ?Use to give insulin daily Dx E11.9 ?  ?famotidine 20 MG tablet ?Commonly known as: PEPCID ?TAKE 1 TABLET BY MOUTH TWICE A DAY ?  ?FreeStyle Libre 14 Day Sensor Misc ?Scan to check glucose at least 4 times daily. DX: E11.4 ?  ?furosemide 20 MG tablet ?Commonly known as: Lasix ?Take 1 tablet (20 mg total) by mouth daily. ?  ?lisinopril 40 MG tablet ?Commonly known as: ZESTRIL ?Take 1 tablet (40 mg total) by mouth daily. ?  ?metFORMIN 500 MG 24 hr tablet ?Commonly known as: GLUCOPHAGE-XR ?TAKE 2 TABLETS BY MOUTH TWICE A DAY WITH FOOD ?  ?mirtazapine 45 MG tablet ?Commonly known as: REMERON ?Take 1 tablet (45 mg total) by mouth at bedtime. ?  ?NovoLOG FlexPen 100 UNIT/ML FlexPen ?Generic drug: insulin aspart ?Inject 10-16 Units into the skin 3 (three) times daily with meals. ?  ?omeprazole 20 MG capsule ?Commonly known as: PRILOSEC ?Take 20 mg by mouth daily. ?  ?onetouch ultrasoft lancets ?Use as  instructed to monitor glucose 4 times daily (use as back up method for CGM). ?  ?OneTouch Verio test strip ?Generic drug: glucose blood ?Use as instructed to monitor glucose 4 times daily (use as back up method for CGM) ?  ?potassium chloride SA 20 MEQ tablet ?Commonly known as: KLOR-CON M ?Take 1 tablet (20 mEq total) by mouth daily. ?  ?rosuvastatin 20 MG tablet ?Commonly known as: CRESTOR ?TAKE 1 TABLET BY MOUTH EVERYDAY AT BEDTIME ?  ?sildenafil 50 MG tablet ?Commonly known as: Viagra ?Take 1 tablet (50 mg total) by mouth daily as needed for erectile dysfunction. ?  ?Tyler Aas FlexTouch 200 UNIT/ML FlexTouch Pen ?Generic drug: insulin degludec ?Inject 60 Units into the skin in the morning. ?  ? ?  ? ? ?Discharge Assessment: ?Vitals:  ? 10/31/21 2106 11/01/21 0553  ?BP: 131/87 (!) 141/83  ?Pulse: 67 77  ?Resp: 16 16  ?Temp: 97.9 ?F (36.6 ?C) 97.9 ?F (36.6 ?C)  ?SpO2: 92% 95%  ? Skin clean, dry and intact without evidence of skin break down, no evidence of skin tears noted. ?IV catheter discontinued intact. Site without signs and symptoms of complications - no redness or edema noted at insertion site, patient denies c/o pain - only slight tenderness at site.  Dressing with slight pressure applied. ? ?D/c Instructions-Education: ?Discharge instructions given to patient/family with verbalized understanding. ?D/c education completed with  patient/family including follow up instructions, medication list, d/c activities limitations if indicated, with other d/c instructions as indicated by MD - patient able to verbalize understanding, all questions fully answered. ?Patient instructed to return to ED, call 911, or call MD for any changes in condition.  ?Patient escorted via [place], and D/C home via private auto. ? ?Santa Lighter, RN ?11/01/2021 11:09 AM  ?

## 2021-11-01 NOTE — Plan of Care (Signed)
?  Problem: Health Behavior/Discharge Planning: ?Goal: Ability to manage health-related needs will improve ?11/01/2021 1108 by Santa Lighter, RN ?Outcome: Adequate for Discharge ?11/01/2021 0933 by Santa Lighter, RN ?Outcome: Progressing ?  ?Problem: Clinical Measurements: ?Goal: Ability to maintain clinical measurements within normal limits will improve ?11/01/2021 1108 by Santa Lighter, RN ?Outcome: Adequate for Discharge ?11/01/2021 0933 by Santa Lighter, RN ?Outcome: Progressing ?Goal: Will remain free from infection ?Outcome: Adequate for Discharge ?Goal: Diagnostic test results will improve ?Outcome: Adequate for Discharge ?Goal: Respiratory complications will improve ?Outcome: Adequate for Discharge ?Goal: Cardiovascular complication will be avoided ?Outcome: Adequate for Discharge ?  ?Problem: Activity: ?Goal: Risk for activity intolerance will decrease ?Outcome: Adequate for Discharge ?  ?Problem: Nutrition: ?Goal: Adequate nutrition will be maintained ?Outcome: Adequate for Discharge ?  ?Problem: Coping: ?Goal: Level of anxiety will decrease ?Outcome: Adequate for Discharge ?  ?Problem: Elimination: ?Goal: Will not experience complications related to bowel motility ?Outcome: Adequate for Discharge ?Goal: Will not experience complications related to urinary retention ?Outcome: Adequate for Discharge ?  ?Problem: Pain Managment: ?Goal: General experience of comfort will improve ?Outcome: Adequate for Discharge ?  ?Problem: Safety: ?Goal: Ability to remain free from injury will improve ?Outcome: Adequate for Discharge ?  ?Problem: Skin Integrity: ?Goal: Risk for impaired skin integrity will decrease ?Outcome: Adequate for Discharge ?  ?Problem: Education: ?Goal: Ability to demonstrate management of disease process will improve ?Outcome: Adequate for Discharge ?Goal: Ability to verbalize understanding of medication therapies will improve ?Outcome: Adequate for Discharge ?Goal: Individualized Educational  Video(s) ?Outcome: Adequate for Discharge ?  ?Problem: Activity: ?Goal: Capacity to carry out activities will improve ?Outcome: Adequate for Discharge ?  ?Problem: Cardiac: ?Goal: Ability to achieve and maintain adequate cardiopulmonary perfusion will improve ?Outcome: Adequate for Discharge ?  ?

## 2021-11-01 NOTE — Plan of Care (Signed)
  Problem: Health Behavior/Discharge Planning: Goal: Ability to manage health-related needs will improve Outcome: Progressing   Problem: Clinical Measurements: Goal: Ability to maintain clinical measurements within normal limits will improve Outcome: Progressing   

## 2021-11-01 NOTE — Care Management Important Message (Signed)
Important Message ? ?Patient Details  ?Name: [name] ?MRN: [id number] ?Date of Birth: October 25, 1950 ? ? ?Medicare Important Message Given:  Yes ? ? ? ? ?Tommy Medal ?11/01/2021, 10:22 AM ?

## 2021-11-01 NOTE — Discharge Summary (Signed)
Physician Discharge Summary  ?[name]:[id number] DOB: Mar 27, 1951 DOA: 10/29/2021 ? ?PCP: [name], DO ? ?Admit date: 10/29/2021 ? ?Discharge date: 11/01/2021 ? ?Admitted From:Home ? ?Disposition:  Home ? ?Recommendations for Outpatient Follow-up:  ?Follow up with PCP in 1-2 weeks ?Please obtain BMP/CBC in one week ?Remain on Lasix 20 mg daily as prescribed along with potassium supplementation ?Advised on low-sodium diet ?Continue on Eliquis as provided for anticoagulation given atrial fibrillation ?Follow-up with cardiology 11/23/21 as scheduled ? ?Home Health: None ? ?Equipment/Devices: None ? ?Discharge Condition:Stable ? ?CODE STATUS: Full ? ?Diet recommendation: Heart Healthy/carb modified ? ?Brief/Interim Summary: ?Per HPI: ?[name] is a 71 y.o. male with medical history significant of obesity, type 2 diabetes, hypertension, dyslipidemia, GERD, and anxiety who presented to the ED with complaints of shortness of breath that began approximately 2 days ago.  It was noted to be worse this morning and he denies any specific orthopnea or dyspnea on exertion.  He says that his lower extremities have been swelling in the last 2 days.  He does admit to increased sodium intake over the last 2-3 days.  He also states that he has had a mild, nonproductive cough.  No fevers or chills noted.  No sick contacts.  He has been taking his home medications as otherwise prescribed and states that he quit smoking over 10 years ago.  He denies any current alcohol use. ? ?-Patient was admitted with concerns for acute CHF and volume overload and has diuresed quite well with IV Lasix over the course of the admission.  He is now euvolemic and ready for discharge.  2D echocardiogram with preserved LVEF noted and no other acute abnormalities.  He was noted to have atrial fibrillation which was a new finding during the course of this admission, but he has remained rate controlled.  No AV nodal blockade agents have  therefore been initiated.  He has been started on anticoagulation with Eliquis for stroke prevention.  He is in stable condition for discharge and will have follow-up with cardiology as noted above. ? ?Discharge Diagnoses:  ?Principal Problem: ?  Acute CHF ([place]) ?Active Problems: ?  Diabetes mellitus ([place]) ?  Gastroesophageal reflux disease without esophagitis ?  Hypertension associated with diabetes ([place]) ?  GAD (generalized anxiety disorder) ?  Morbid obesity ([place]) ?  Atrial fibrillation ([place]) ? ?Principal discharge diagnosis: Acute HFpEF decompensation.  New atrial fibrillation. ? ?Discharge Instructions ? ?Discharge Instructions   ? ? Diet - low sodium heart healthy   Complete by: As directed ?  ? Increase activity slowly   Complete by: As directed ?  ? ?  ? ?Allergies as of 11/01/2021   ?No Known Allergies ?  ? ?  ?Medication List  ?  ? ?TAKE these medications   ? ?ALPRAZolam 1 MG tablet ?Commonly known as: Duanne Moron ?Take 0.5-1 tablets (0.5-1 mg total) by mouth 3 (three) times daily as needed for anxiety (take ONLY IF NEEDED). ?  ?amLODipine 5 MG tablet ?Commonly known as: NORVASC ?Take 1 tablet (5 mg total) by mouth daily. ?  ?apixaban 5 MG Tabs tablet ?Commonly known as: ELIQUIS ?Take 1 tablet (5 mg total) by mouth 2 (two) times daily. ?  ?B-D ULTRAFINE III SHORT PEN 31G X 8 MM Misc ?Generic drug: Insulin Pen Needle ?Use to give insulin daily Dx E11.9 ?  ?famotidine 20 MG tablet ?Commonly known as: PEPCID ?TAKE 1 TABLET BY MOUTH TWICE A DAY ?  ?FreeStyle Libre 14 Day  Sensor Misc ?Scan to check glucose at least 4 times daily. DX: E11.4 ?  ?furosemide 20 MG tablet ?Commonly known as: Lasix ?Take 1 tablet (20 mg total) by mouth daily. ?  ?lisinopril 40 MG tablet ?Commonly known as: ZESTRIL ?Take 1 tablet (40 mg total) by mouth daily. ?  ?metFORMIN 500 MG 24 hr tablet ?Commonly known as: GLUCOPHAGE-XR ?TAKE 2 TABLETS BY MOUTH TWICE A DAY WITH FOOD ?  ?mirtazapine 45 MG tablet ?Commonly known as: REMERON ?Take 1  tablet (45 mg total) by mouth at bedtime. ?  ?NovoLOG FlexPen 100 UNIT/ML FlexPen ?Generic drug: insulin aspart ?Inject 10-16 Units into the skin 3 (three) times daily with meals. ?  ?omeprazole 20 MG capsule ?Commonly known as: PRILOSEC ?Take 20 mg by mouth daily. ?  ?onetouch ultrasoft lancets ?Use as instructed to monitor glucose 4 times daily (use as back up method for CGM). ?  ?OneTouch Verio test strip ?Generic drug: glucose blood ?Use as instructed to monitor glucose 4 times daily (use as back up method for CGM) ?  ?potassium chloride SA 20 MEQ tablet ?Commonly known as: KLOR-CON M ?Take 1 tablet (20 mEq total) by mouth daily. ?  ?rosuvastatin 20 MG tablet ?Commonly known as: CRESTOR ?TAKE 1 TABLET BY MOUTH EVERYDAY AT BEDTIME ?  ?sildenafil 50 MG tablet ?Commonly known as: Viagra ?Take 1 tablet (50 mg total) by mouth daily as needed for erectile dysfunction. ?  ?Tyler Aas FlexTouch 200 UNIT/ML FlexTouch Pen ?Generic drug: insulin degludec ?Inject 60 Units into the skin in the morning. ?  ? ?  ? ? Follow-up Information   ? ? Buford Dresser, MD Follow up on 11/23/2021.   ?Specialty: Cardiology ?Why: Cardiology Follow-up on 11/23/2021 at 8:40 AM. At [facility] off of [address] ?Contact information: ?[address] ?[place] [address] ?[phone] ? ? ?  ?  ? ? Ronnie Doss M, DO. Schedule an appointment as soon as possible for a visit in 1 week(s).   ?Specialty: Family Medicine ?Contact information: ?[address] ?[place] [address] ?[phone] ? ? ?  ?  ? ?  ?  ? ?  ? ?No Known Allergies ? ?Consultations: ?None ? ? ?Procedures/Studies: ?CT Angio Chest PE W/Cm &/Or Wo Cm ? ?Result Date: 10/29/2021 ?CLINICAL DATA:  2 day history of worsening shortness of breath. PE suspected. EXAM: CT ANGIOGRAPHY CHEST WITH CONTRAST TECHNIQUE: Multidetector CT imaging of the chest was performed using the standard protocol during bolus administration of intravenous contrast.  Multiplanar CT image reconstructions and MIPs were obtained to evaluate the vascular anatomy. RADIATION DOSE REDUCTION: This exam was performed according to the departmental dose-optimization program which includes automated exposure control, adjustment of the mA and/or kV according to patient size and/or use of iterative reconstruction technique. CONTRAST:  43m OMNIPAQUE IOHEXOL 350 MG/ML SOLN COMPARISON:  None. FINDINGS: Cardiovascular: Heart is enlarged. No substantial pericardial effusion. Coronary artery calcification is evident. Mild atherosclerotic calcification is noted in the wall of the thoracic aorta. There is no filling defect within the opacified pulmonary arteries to suggest the presence of an acute pulmonary embolus. Mediastinum/Nodes: 2.1 cm left thyroid nodule identified on image 13/series 4 mild mediastinal lymphadenopathy including 13 mm short axis AP window lymph node on 32/4 and 14 mm short axis subcarinal lymph node on 46/4. Prominent lymphoid tissue seen in both hilar regions. The esophagus has normal imaging features. There is no axillary lymphadenopathy. Lungs/Pleura: 5 mm right lower lobe pulmonary nodule identified on image 80/series 6.  Subsegmental atelectasis seen in the lung bases bilaterally. 16 mm nodular opacity in the deep posterior left costophrenic sulcus is probably atelectatic. Tiny bilateral pleural effusions evident. Upper Abdomen: Unremarkable. Musculoskeletal: No worrisome lytic or sclerotic osseous abnormality. Review of the MIP images confirms the above findings. IMPRESSION: 1. No CT evidence for acute pulmonary embolus. 2. 5 mm right lower lobe pulmonary nodule with 16 mm nodular opacity in the deep posterior left costophrenic sulcus. Follow-up CT chest in 3 months is recommended to ensure resolution. 3. Tiny bilateral pleural effusions with bibasilar subsegmental atelectasis. 4. Mild lymphadenopathy in the mediastinum. Attention on follow-up 3 month CT recommended. 5.  2.1 cm left thyroid nodule. Recommend thyroid US (ref: J Am Coll Radiol. 2015 Feb;12(2): 143-50). 6. Aortic Atherosclerosis (ICD10-I70.0). Electronically Signed   By: Misty Stanley M.D.   On: 10/29/2021 10:01  ? ?DG

## 2021-11-02 ENCOUNTER — Telehealth: Payer: Self-pay

## 2021-11-02 NOTE — Telephone Encounter (Signed)
Transition Care Management Follow-up Telephone Call ?Date of discharge and from where: 11/01/21 - [facility] ?How have you been since you were released from the hospital? Doing fair ?Any questions or concerns? No ? ?Items Reviewed: ?Did the pt receive and understand the discharge instructions provided? Yes  ?Medications obtained and verified? Yes  ?Other? No  ?Any new allergies since your discharge? No  ?Dietary orders reviewed? Yes ?Do you have support at home?  Lives alone, but has people who check on him frequently ? ?Home Care and Equipment/Supplies: ?Were home health services ordered? no ? ?Were any new equipment or medical supplies ordered?  No ? ?Functional Questionnaire: (I = Independent and D = Dependent) ?ADLs: I ? ?Bathing/Dressing- I ? ?Meal Prep- I ? ?Eating- I ? ?Maintaining continence- I ? ?Transferring/Ambulation- I ? ?Managing Meds- I ? ?Follow up appointments reviewed: ? ?PCP Hospital f/u appt confirmed? Yes  Scheduled to see Gottschalk on 11/06/21 @ 11:50. ?[facility] f/u appt confirmed? Yes  Scheduled to see Dr Harrell Gave, cardiologist on 11/23/21 @ 8:40. ?Are transportation arrangements needed? No  ?If their condition worsens, is the pt aware to call PCP or go to the Emergency Dept.? Yes ?Was the patient provided with contact information for the PCP's office or ED? Yes ?Was to pt encouraged to call back with questions or concerns? Yes ? ?

## 2021-11-05 DIAGNOSIS — E1140 Type 2 diabetes mellitus with diabetic neuropathy, unspecified: Secondary | ICD-10-CM | POA: Diagnosis not present

## 2021-11-05 DIAGNOSIS — Z794 Long term (current) use of insulin: Secondary | ICD-10-CM | POA: Diagnosis not present

## 2021-11-06 ENCOUNTER — Encounter: Payer: Self-pay | Admitting: Family Medicine

## 2021-11-06 ENCOUNTER — Ambulatory Visit (INDEPENDENT_AMBULATORY_CARE_PROVIDER_SITE_OTHER): Payer: Medicare Other | Admitting: Family Medicine

## 2021-11-06 VITALS — BP 133/74 | HR 86 | Temp 98.3°F | Ht 72.0 in | Wt 298.4 lb

## 2021-11-06 DIAGNOSIS — Z09 Encounter for follow-up examination after completed treatment for conditions other than malignant neoplasm: Secondary | ICD-10-CM

## 2021-11-06 DIAGNOSIS — I4891 Unspecified atrial fibrillation: Secondary | ICD-10-CM | POA: Diagnosis not present

## 2021-11-06 DIAGNOSIS — I509 Heart failure, unspecified: Secondary | ICD-10-CM

## 2021-11-06 DIAGNOSIS — F411 Generalized anxiety disorder: Secondary | ICD-10-CM

## 2021-11-06 MED ORDER — ALPRAZOLAM 1 MG PO TABS: 0.5000 mg | ORAL_TABLET | Freq: Three times a day (TID) | ORAL | 1 refills | Status: DC | PRN

## 2021-11-06 NOTE — Patient Instructions (Signed)
You had labs performed today.  You will be contacted with the results of the labs once they are available, usually in the next 3 business days for routine lab work.  If you have an active my chart account, they will be released to your MyChart.  If you prefer to have these labs released to you via telephone, please let us know. ?  ?Heart Failure Eating Plan ?Heart failure, also called congestive heart failure, occurs when your heart does not pump blood well enough to meet your body's needs for oxygen-rich blood. Heart failure is a long-term (chronic) condition. Living with heart failure can be challenging. Following your health care provider's instructions about a healthy lifestyle and working with a dietitian to choose the right foods may help to improve your symptoms. An eating plan for someone with heart failure will include changes that limit the intake of salt (sodium) and unhealthy fat. ?What are tips for following this plan? ?Reading food labels ?Check food labels for the amount of sodium per serving. Choose foods that have less than 140 mg (milligrams) of sodium in each serving. ?Check food labels for the number of calories per serving. This is important if you need to limit your daily calorie intake to lose weight. ?Check food labels for the serving size. If you eat more than one serving, you will be eating more sodium and calories than what is listed on the label. ?Look for foods that are labeled as "sodium-free," "very low sodium," or "low sodium." ?Foods labeled as "reduced sodium" or "lightly salted" may still have more sodium than what is recommended for you. ?Cooking ?Avoid adding salt when cooking. Ask your health care provider or dietitian before using salt substitutes. ?Season food with salt-free seasonings, spices, or herbs. Check the label of seasoning mixes to make sure they do not contain salt. ?Cook with heart-healthy oils, such as olive, canola, soybean, or sunflower oil. ?Do not fry foods.  Cook foods using low-fat methods, such as baking, boiling, grilling, and broiling. ?Limit unhealthy fats when cooking by: ?Removing the skin from poultry, such as chicken. ?Removing all visible fats from meats. ?Skimming the fat off from stews, soups, and gravies before serving them. ?Meal planning ? ?Limit your intake of: ?Processed, canned, or prepackaged foods. ?Foods that are high in trans fat, such as fried foods. ?Sweets, desserts, sugary drinks, and other foods with added sugar. ?Full-fat dairy products, such as whole milk. ?Eat a balanced diet. This may include: ?4-5 servings of fruit each day and 4-5 servings of vegetables each day. At each meal, try to fill one-half of your plate with fruits and vegetables. ?Up to 6-8 servings of whole grains each day. ?Up to 2 servings of lean meat, poultry, or fish each day. One serving of meat is equal to 3 oz (85 g). This is about the same size as a deck of cards. ?2 servings of low-fat dairy each day. ?Heart-healthy fats. Healthy fats called omega-3 fatty acids are found in foods such as flaxseed and cold-water fish like sardines, salmon, and mackerel. ?Aim to eat 25-35 g (grams) of fiber a day. Foods that are high in fiber include apples, broccoli, carrots, beans, peas, and whole grains. ?Do not add salt or condiments that contain salt (such as soy sauce) to foods before eating. ?When eating at a restaurant, ask that your food be prepared with less salt or no salt, if possible. ?Try to eat 2 or more vegetarian meals each week. ?Eat more home-cooked food and  eat less restaurant, buffet, and fast food. ?General information ?Do not eat more than 2,300 mg of sodium a day. The amount of sodium that is recommended for you may be lower, depending on your condition. ?Maintain a healthy body weight as directed. Ask your health care provider what a healthy weight is for you. ?Check your weight every day. ?Work with your health care provider and dietitian to make a plan that is  right for you to lose weight or maintain your current weight. ?Limit how much fluid you drink. Ask your health care provider or dietitian how much fluid you can have each day. ?Limit or avoid alcohol as told by your health care provider or dietitian. ?Recommended foods ?Fruits ?All fresh, frozen, and canned fruits. Dried fruits, such as raisins, prunes, and cranberries. ?Vegetables ?All fresh vegetables. Vegetables that are frozen without sauce or added salt. Low-sodium or sodium-free canned vegetables. ?Grains ?Bread with less than 80 mg of sodium per slice. Whole-wheat pasta, quinoa, and brown rice. Oats and oatmeal. Barley. [place]. Grits and cream of wheat. Whole-grain and whole-wheat cold cereal. ?Meats and other protein foods ?Lean cuts of meat. Skinless chicken and [place]. Fish with high omega-3 fatty acids, such as salmon, sardines, and other cold-water fishes. Eggs. Dried beans, peas, and edamame. Unsalted nuts and nut butters. ?Dairy ?Low-fat or nonfat (skim) milk and dried milk. Rice milk, soy milk, and almond milk. Low-fat or nonfat yogurt. Small amounts of reduced-sodium block cheese. Low-sodium cottage cheese. ?Fats and oils ?Olive, canola, soybean, flaxseed, avocado, or sunflower oil. ?Sweets and desserts ?Applesauce. Granola bars. Sugar-free pudding and gelatin. Frozen fruit bars. ?Seasoning and other foods ?Fresh and dried herbs. Lemon or lime juice. Vinegar. Low-sodium ketchup. Salt-free marinades, salad dressings, sauces, and seasonings. ?The items listed above may not be a complete list of foods and beverages you can eat. Contact a dietitian for more information. ?Foods to avoid ?Fruits ?Fruits that are dried with sodium-containing preservatives. ?Vegetables ?Canned vegetables. Frozen vegetables with sauce or seasonings. Creamed vegetables. [place] fries. Onion rings. Pickled vegetables and sauerkraut. ?Grains ?Bread with more than 80 mg of sodium per slice. Hot or cold cereal with more than 140 mg  sodium per serving. Salted pretzels and crackers. Prepackaged breadcrumbs. Bagels, croissants, and biscuits. ?Meats and other protein foods ?Ribs and chicken wings. Bacon, ham, pepperoni, bologna, salami, and packaged luncheon meats. Hot dogs, bratwurst, and sausage. Canned meat. Smoked meat and fish. Salted nuts and seeds. ?Dairy ?Whole milk, half-and-half, and cream. Buttermilk. Processed cheese, cheese spreads, and cheese curds. Regular cottage cheese. Feta cheese. Shredded cheese. String cheese. ?Fats and oils ?Butter, lard, shortening, ghee, and bacon fat. Canned and packaged gravies. ?Seasoning and other foods ?Onion salt, garlic salt, table salt, and sea salt. Marinades. Regular salad dressings. Relishes, pickles, and olives. Meat flavorings and tenderizers, and bouillon cubes. Horseradish, ketchup, and mustard. Worcestershire sauce. Teriyaki sauce, soy sauce (including reduced sodium). Hot sauce and Tabasco sauce. Steak sauce, fish sauce, oyster sauce, and cocktail sauce. Taco seasonings. Barbecue sauce. Tartar sauce. ?The items listed above may not be a complete list of foods and beverages you should avoid. Contact a dietitian for more information. ?Summary ?A heart failure eating plan includes changes that limit your intake of sodium and unhealthy fat, and it may help you lose weight or maintain a healthy weight. Your health care provider may also recommend limiting how much fluid you drink. ?Most people with heart failure should eat no more than 2,300 mg of salt (sodium) a day.  The amount of sodium that is recommended for you may be lower, depending on your condition. ?Contact your health care provider or dietitian before making any major changes to your diet. ?This information is not intended to replace advice given to you by your health care provider. Make sure you discuss any questions you have with your health care provider. ?Document Revised: 03/06/2020 Document Reviewed: 03/06/2020 ?Elsevier Patient  Education ? 2022 [place]. ? ? ?

## 2021-11-06 NOTE — Progress Notes (Signed)
? ?Subjective: ?CC:CHF/ AFib ?PCP: [name], DO ?HDQ:[name] is a 71 y.o. male presenting to clinic today for: ? ?1. CHF/ AFib ?Patient was seen in the hospital and ultimately admitted for acute CHF.  This appears to have been secondary to some dietary indiscretions but apparently he also had a new onset atrial fibrillation.  He was diuresed and discharged home with Lasix and Eliquis.  CBC and BMP were recommended for follow-up.  He has a follow-up with cardiology in about 3 weeks.  He denies any rectal bleeding, nosebleeding or blood in urine.  He has not experienced any heart palpitations.  He reports he feels loads better after being diuresed in the hospital and continues to have good urine output.  He has had no recurrence of lower extremity edema and does feel more energetic now that he has gotten the fluid off. ? ? ?ROS: Per HPI ? ?No Known Allergies ?Past Medical History:  ?Diagnosis Date  ? Anxiety   ? Arthritis   ? Diabetes mellitus without complication ([place])   ? GERD (gastroesophageal reflux disease)   ? Hypercholesteremia   ? Hypertension   ? ? ?Current Outpatient Medications:  ?  ALPRAZolam (XANAX) 1 MG tablet, Take 0.5-1 tablets (0.5-1 mg total) by mouth 3 (three) times daily as needed for anxiety (take ONLY IF NEEDED)., Disp: 90 tablet, Rfl: 1 ?  amLODipine (NORVASC) 5 MG tablet, Take 1 tablet (5 mg total) by mouth daily., Disp: 90 tablet, Rfl: 3 ?  apixaban (ELIQUIS) 5 MG TABS tablet, Take 1 tablet (5 mg total) by mouth 2 (two) times daily., Disp: 60 tablet, Rfl: 2 ?  Continuous Blood Gluc Sensor (FREESTYLE LIBRE 14 DAY SENSOR) MISC, Scan to check glucose at least 4 times daily. DX: E11.4, Disp: 2 each, Rfl: 12 ?  famotidine (PEPCID) 20 MG tablet, TAKE 1 TABLET BY MOUTH TWICE A DAY, Disp: 180 tablet, Rfl: 1 ?  furosemide (LASIX) 20 MG tablet, Take 1 tablet (20 mg total) by mouth daily., Disp: 30 tablet, Rfl: 11 ?  glucose blood (ONETOUCH VERIO) test strip, Use as instructed to monitor  glucose 4 times daily (use as back up method for CGM), Disp: 100 each, Rfl: 12 ?  insulin aspart (NOVOLOG FLEXPEN) 100 UNIT/ML FlexPen, Inject 10-16 Units into the skin 3 (three) times daily with meals., Disp: , Rfl:  ?  insulin degludec (TRESIBA FLEXTOUCH) 200 UNIT/ML FlexTouch Pen, Inject 60 Units into the skin in the morning., Disp: 18 mL, Rfl: 5 ?  Insulin Pen Needle (B-D ULTRAFINE III SHORT PEN) 31G X 8 MM MISC, Use to give insulin daily Dx E11.9, Disp: 100 each, Rfl: 3 ?  Lancets (ONETOUCH ULTRASOFT) lancets, Use as instructed to monitor glucose 4 times daily (use as back up method for CGM)., Disp: 100 each, Rfl: 12 ?  lisinopril (ZESTRIL) 40 MG tablet, Take 1 tablet (40 mg total) by mouth daily., Disp: 90 tablet, Rfl: 3 ?  metFORMIN (GLUCOPHAGE-XR) 500 MG 24 hr tablet, TAKE 2 TABLETS BY MOUTH TWICE A DAY WITH FOOD, Disp: 360 tablet, Rfl: 3 ?  mirtazapine (REMERON) 45 MG tablet, Take 1 tablet (45 mg total) by mouth at bedtime., Disp: 90 tablet, Rfl: 3 ?  omeprazole (PRILOSEC) 20 MG capsule, Take 20 mg by mouth daily., Disp: , Rfl:  ?  potassium chloride SA (KLOR-CON M) 20 MEQ tablet, Take 1 tablet (20 mEq total) by mouth daily., Disp: 30 tablet, Rfl: 2 ?  rosuvastatin (CRESTOR) 20 MG tablet, TAKE  1 TABLET BY MOUTH EVERYDAY AT BEDTIME, Disp: 90 tablet, Rfl: 3 ?  sildenafil (VIAGRA) 50 MG tablet, Take 1 tablet (50 mg total) by mouth daily as needed for erectile dysfunction., Disp: 10 tablet, Rfl: 2 ?Social History  ? ?Socioeconomic History  ? Marital status: Divorced  ?  Spouse name: Not on file  ? Number of children: Not on file  ? Years of education: Not on file  ? Highest education level: Not on file  ?Occupational History  ? Not on file  ?Tobacco Use  ? Smoking status: Former  ?  Packs/day: 0.50  ?  Years: 41.00  ?  Pack years: 20.50  ?  Types: Cigarettes  ?  Quit date: 02/29/2012  ?  Years since quitting: 9.6  ? Smokeless tobacco: Never  ?Vaping Use  ? Vaping Use: Never used  ?Substance and Sexual Activity   ? Alcohol use: No  ?  Comment: quit 2012  ? Drug use: No  ?  Comment: quit in 2012  ? Sexual activity: Yes  ?  Birth control/protection: None  ?Other Topics Concern  ? Not on file  ?Social History Narrative  ? Not on file  ? ?Social Determinants of Health  ? ?Financial Resource Strain: Low Risk   ? Difficulty of Paying Living Expenses: Not hard at all  ?Food Insecurity: No Food Insecurity  ? Worried About Charity fundraiser in the Last Year: Never true  ? Ran Out of Food in the Last Year: Never true  ?Transportation Needs: No Transportation Needs  ? Lack of Transportation (Medical): No  ? Lack of Transportation (Non-Medical): No  ?Physical Activity: Insufficiently Active  ? Days of Exercise per Week: 3 days  ? Minutes of Exercise per Session: 30 min  ?Stress: No Stress Concern Present  ? Feeling of Stress : Not at all  ?Social Connections: Moderately Integrated  ? Frequency of Communication with Friends and Family: More than three times a week  ? Frequency of Social Gatherings with Friends and Family: More than three times a week  ? Attends Religious Services: More than 4 times per year  ? Active Member of Clubs or Organizations: Yes  ? Attends Archivist Meetings: More than 4 times per year  ? Marital Status: Divorced  ?Intimate Partner Violence: Not At Risk  ? Fear of Current or Ex-Partner: No  ? Emotionally Abused: No  ? Physically Abused: No  ? Sexually Abused: No  ? ?Family History  ?Problem Relation Age of Onset  ? Diabetes Mother   ? Glaucoma Mother   ? ? ?Objective: ?Office vital signs reviewed. ?BP 133/74   Pulse 86   Temp 98.3 ?F (36.8 ?C)   Ht 6' (1.829 m)   Wt 298 lb 6.4 oz (135.4 kg)   SpO2 96%   BMI 40.47 kg/m?  ? ?Physical Examination:  ?General: Awake, alert, morbidly obese, No acute distress ?Cardio: regular rate and rhythm, S1S2 heard, no murmurs appreciated ?Pulm: clear to auscultation bilaterally, no wheezes, rhonchi or rales; normal work of breathing on room air ?Extremities:  Warm, no edema ?MSK: Antalgic gait  ?Psych: Mood stable, speech normal ? ?Assessment/ Plan: ?71 y.o. male  ? ?Acute congestive heart failure, unspecified heart failure type ([place]) - Plan: Basic Metabolic Panel, CBC ? ?New onset a-fib [facility]) - Plan: Basic Metabolic Panel, CBC ? ?Hospital discharge follow-up ? ?GAD (generalized anxiety disorder) - Plan: ALPRAZolam (XANAX) 1 MG tablet ? ?No evidence of ongoing fluid overload.  Check  BMP given diuresis and CBC given anticoagulation ? ?He seem to be both rate and rhythm controlled on exam.  Uncertain if he converted in the hospital.  Check BMP, CBC.  May need to consider alternative therapy to the tetra cyclic antidepressant that he is on if he continues to have issues with A-fib as this does increase his risk of QTc prolongation ? ?For his anxiety disorder have renewed his alprazolam.  He is up-to-date on CSA and UDS.  The national narcotic database reviewed and there were no red flags. ? ?No orders of the defined types were placed in this encounter. ? ?No orders of the defined types were placed in this encounter. ? ? ?Today's visit is for Transitional Care Management. ? ?The patient was discharged from [facility] on 11/01/2021 with a primary diagnosis of CHF.  ? ?Contact with the patient and/or caregiver, by a clinical staff member, was made on 11/02/2021 and was documented as a telephone encounter within the EMR. ? ?Through chart review and discussion with the patient I have determined that management of their condition is of moderate complexity.  ? ? ?[name], DO ?[place] ?[phone] ? ? ?

## 2021-11-07 LAB — BASIC METABOLIC PANEL
BUN/Creatinine Ratio: 17 (ref 10–24)
BUN: 18 mg/dL (ref 8–27)
CO2: 21 mmol/L (ref 20–29)
Calcium: 9.6 mg/dL (ref 8.6–10.2)
Chloride: 105 mmol/L (ref 96–106)
Creatinine, Ser: 1.05 mg/dL (ref 0.76–1.27)
Glucose: 136 mg/dL — ABNORMAL HIGH (ref 70–99)
Potassium: 5.6 mmol/L — ABNORMAL HIGH (ref 3.5–5.2)
Sodium: 143 mmol/L (ref 134–144)
eGFR: 76 mL/min/{1.73_m2} (ref >59)

## 2021-11-07 LAB — CBC
Hematocrit: 45.7 % (ref 37.5–51.0)
Hemoglobin: 14.1 g/dL (ref 13.0–17.7)
MCH: 24.3 pg — ABNORMAL LOW (ref 26.6–33.0)
MCHC: 30.9 g/dL — ABNORMAL LOW (ref 31.5–35.7)
MCV: 79 fL (ref 79–97)
Platelets: 252 10*3/uL (ref 150–450)
RBC: 5.8 x10E6/uL (ref 4.14–5.80)
RDW: 17.6 % — ABNORMAL HIGH (ref 11.6–15.4)
WBC: 6.2 10*3/uL (ref 3.4–10.8)

## 2021-11-12 ENCOUNTER — Ambulatory Visit: Payer: Medicare Other | Admitting: Nurse Practitioner

## 2021-11-12 NOTE — Patient Instructions (Incomplete)

## 2021-11-13 NOTE — Patient Instructions (Signed)

## 2021-11-14 ENCOUNTER — Encounter: Payer: Self-pay | Admitting: Nurse Practitioner

## 2021-11-14 ENCOUNTER — Ambulatory Visit: Payer: Medicare Other | Admitting: Nurse Practitioner

## 2021-11-14 VITALS — BP 136/72 | HR 60 | Ht 72.0 in | Wt 302.0 lb

## 2021-11-14 DIAGNOSIS — E1122 Type 2 diabetes mellitus with diabetic chronic kidney disease: Secondary | ICD-10-CM

## 2021-11-14 DIAGNOSIS — I10 Essential (primary) hypertension: Secondary | ICD-10-CM | POA: Diagnosis not present

## 2021-11-14 DIAGNOSIS — E782 Mixed hyperlipidemia: Secondary | ICD-10-CM

## 2021-11-14 DIAGNOSIS — N182 Chronic kidney disease, stage 2 (mild): Secondary | ICD-10-CM | POA: Diagnosis not present

## 2021-11-14 DIAGNOSIS — Z794 Long term (current) use of insulin: Secondary | ICD-10-CM | POA: Diagnosis not present

## 2021-11-14 NOTE — Progress Notes (Signed)
? ?                                                    Endocrinology Follow Up Note  ?     11/14/2021, 11:39 AM ? ? ?Subjective:  ? ? Patient ID: [name], male    DOB: 02-16-1951.  ?[name] is being seen in follow up after being seen in consultation for management of currently uncontrolled symptomatic diabetes requested by  [name], DO. ? ? ?Past Medical History:  ?Diagnosis Date  ? Anxiety   ? Arthritis   ? Diabetes mellitus without complication ([place])   ? GERD (gastroesophageal reflux disease)   ? Hypercholesteremia   ? Hypertension   ? ? ?Past Surgical History:  ?Procedure Laterality Date  ? boils    ? removed form back of skull  ? CATARACT EXTRACTION W/PHACO Right 04/02/2013  ? Procedure: CATARACT EXTRACTION PHACO AND INTRAOCULAR LENS PLACEMENT (IOC);  Surgeon: Williams Che, MD;  Location: [facility];  Service: Ophthalmology;  Laterality: Right;  CDE 7.00  ? CATARACT EXTRACTION W/PHACO Left 10/04/2013  ? Procedure: CATARACT EXTRACTION PHACO AND INTRAOCULAR LENS PLACEMENT (IOC);  Surgeon: Williams Che, MD;  Location: [facility];  Service: Ophthalmology;  Laterality: Left;  CDE:1.46  ? COLONOSCOPY N/A 07/03/2015  ? Procedure: COLONOSCOPY;  Surgeon: Rogene Houston, MD;  Location: [facility];  Service: Endoscopy;  Laterality: N/A;  830  ? EYE SURGERY Right   ? "valve replaced and stent placed" per pt to reroute the fluid in his eye  ? ROTATOR CUFF REPAIR Right   ? ? ?Social History  ? ?Socioeconomic History  ? Marital status: Divorced  ?  Spouse name: Not on file  ? Number of children: Not on file  ? Years of education: Not on file  ? Highest education level: Not on file  ?Occupational History  ? Not on file  ?Tobacco Use  ? Smoking status: Former  ?  Packs/day: 0.50  ?  Years: 41.00  ?  Pack years: 20.50  ?  Types: Cigarettes  ?  Quit date: 02/29/2012  ?  Years since quitting: 9.7  ? Smokeless tobacco: Never  ?Vaping Use  ? Vaping Use: Never used  ?Substance and Sexual Activity   ? Alcohol use: No  ?  Comment: quit 2012  ? Drug use: No  ?  Comment: quit in 2012  ? Sexual activity: Yes  ?  Birth control/protection: None  ?Other Topics Concern  ? Not on file  ?Social History Narrative  ? Not on file  ? ?Social Determinants of Health  ? ?Financial Resource Strain: Low Risk   ? Difficulty of Paying Living Expenses: Not hard at all  ?Food Insecurity: No Food Insecurity  ? Worried About Charity fundraiser in the Last Year: Never true  ? Ran Out of Food in the Last Year: Never true  ?Transportation Needs: No Transportation Needs  ? Lack of Transportation (Medical): No  ? Lack of Transportation (Non-Medical): No  ?Physical Activity: Insufficiently Active  ? Days of Exercise per Week: 3 days  ? Minutes of Exercise per Session: 30 min  ?Stress: No Stress Concern Present  ? Feeling of Stress : Not at all  ?Social Connections: Moderately Integrated  ? Frequency of Communication with Friends and Family:  More than three times a week  ? Frequency of Social Gatherings with Friends and Family: More than three times a week  ? Attends Religious Services: More than 4 times per year  ? Active Member of Clubs or Organizations: Yes  ? Attends Archivist Meetings: More than 4 times per year  ? Marital Status: Divorced  ? ? ?Family History  ?Problem Relation Age of Onset  ? Diabetes Mother   ? Glaucoma Mother   ? ? ?Outpatient Encounter Medications as of 11/14/2021  ?Medication Sig  ? ALPRAZolam (XANAX) 1 MG tablet Take 0.5-1 tablets (0.5-1 mg total) by mouth 3 (three) times daily as needed for anxiety (take ONLY IF NEEDED).  ? amLODipine (NORVASC) 5 MG tablet Take 1 tablet (5 mg total) by mouth daily.  ? apixaban (ELIQUIS) 5 MG TABS tablet Take 1 tablet (5 mg total) by mouth 2 (two) times daily.  ? Continuous Blood Gluc Sensor (FREESTYLE LIBRE 14 DAY SENSOR) MISC Scan to check glucose at least 4 times daily. DX: E11.4  ? famotidine (PEPCID) 20 MG tablet TAKE 1 TABLET BY MOUTH TWICE A DAY  ? furosemide  (LASIX) 20 MG tablet Take 1 tablet (20 mg total) by mouth daily.  ? glucose blood (ONETOUCH VERIO) test strip Use as instructed to monitor glucose 4 times daily (use as back up method for CGM)  ? insulin aspart (NOVOLOG FLEXPEN) 100 UNIT/ML FlexPen Inject 10-16 Units into the skin 3 (three) times daily with meals.  ? insulin degludec (TRESIBA FLEXTOUCH) 200 UNIT/ML FlexTouch Pen Inject 60 Units into the skin in the morning.  ? Insulin Pen Needle (B-D ULTRAFINE III SHORT PEN) 31G X 8 MM MISC Use to give insulin daily Dx E11.9  ? Lancets (ONETOUCH ULTRASOFT) lancets Use as instructed to monitor glucose 4 times daily (use as back up method for CGM).  ? lisinopril (ZESTRIL) 40 MG tablet Take 1 tablet (40 mg total) by mouth daily.  ? metFORMIN (GLUCOPHAGE-XR) 500 MG 24 hr tablet TAKE 2 TABLETS BY MOUTH TWICE A DAY WITH FOOD  ? mirtazapine (REMERON) 45 MG tablet Take 1 tablet (45 mg total) by mouth at bedtime.  ? omeprazole (PRILOSEC) 20 MG capsule Take 20 mg by mouth daily.  ? potassium chloride SA (KLOR-CON M) 20 MEQ tablet Take 1 tablet (20 mEq total) by mouth daily.  ? rosuvastatin (CRESTOR) 20 MG tablet TAKE 1 TABLET BY MOUTH EVERYDAY AT BEDTIME  ? sildenafil (VIAGRA) 50 MG tablet Take 1 tablet (50 mg total) by mouth daily as needed for erectile dysfunction.  ? ?No facility-administered encounter medications on file as of 11/14/2021.  ? ? ?ALLERGIES: ?No Known Allergies ? ?VACCINATION STATUS: ?Immunization History  ?Administered Date(s) Administered  ? Fluad Quad(high Dose 65+) 04/30/2017, 04/13/2019  ? Influenza Split 04/19/2014, 05/29/2020  ? Influenza-Unspecified 04/22/2018, 05/24/2020, 05/19/2021  ? Moderna SARS-COV2 Booster Vaccination 12/13/2020  ? Moderna Sars-Covid-2 Vaccination 09/30/2019, 10/29/2019, 06/24/2020, 12/13/2020  ? Pneumococcal Conjugate-13 04/23/2018  ? Pneumococcal Polysaccharide-23 12/28/2015  ? Zoster, Live 01/04/2016  ? ? ?Diabetes ?He presents for his follow-up diabetic visit. He has type 2  diabetes mellitus. Onset time: diagnosed at approx age of 29. His disease course has been improving. There are no hypoglycemic associated symptoms. Associated symptoms include foot paresthesias. Pertinent negatives for diabetes include no blurred vision, no fatigue, no polydipsia, no polyuria, no visual change, no weakness and no weight loss. There are no hypoglycemic complications. Symptoms are improving. Diabetic complications include heart disease, impotence, nephropathy and retinopathy.  Risk factors for coronary artery disease include diabetes mellitus, dyslipidemia, family history, hypertension, male sex, obesity, sedentary lifestyle and tobacco exposure. Current diabetic treatment includes intensive insulin program and oral agent (monotherapy). He is compliant with treatment most of the time. His weight is increasing steadily. He is following a generally healthy diet. When asked about meal planning, he reported none. He has not had a previous visit with a dietitian. He rarely participates in exercise. His home blood glucose trend is decreasing steadily. His breakfast blood glucose range is generally 130-140 mg/dl. His lunch blood glucose range is generally 140-180 mg/dl. His dinner blood glucose range is generally 180-200 mg/dl. His bedtime blood glucose range is generally 130-140 mg/dl. (He presents today with his CGM showing improved glycemic profile overall.  His previsit A1c was 8.1% on 3/27.  Analysis of his meter shows TIR 66%, TAR 34%, TBR 0% with a GMI of 7.2%.  He has been working hard on diet and exercise and is seeing it pay off.  He denies any significant hypoglycemia.) An ACE inhibitor/angiotensin II receptor blocker is being taken. He does not see a podiatrist.Eye exam is current.  ?Hyperlipidemia ?This is a chronic problem. The current episode started more than 1 year ago. The problem is controlled. Recent lipid tests were reviewed and are normal. Exacerbating diseases include chronic renal  disease, diabetes and obesity. Factors aggravating his hyperlipidemia include fatty foods. Current antihyperlipidemic treatment includes statins. The current treatment provides moderate improvement of lipi

## 2021-11-23 ENCOUNTER — Ambulatory Visit (HOSPITAL_BASED_OUTPATIENT_CLINIC_OR_DEPARTMENT_OTHER): Payer: Medicare Other | Admitting: Cardiology

## 2021-11-23 ENCOUNTER — Encounter (HOSPITAL_BASED_OUTPATIENT_CLINIC_OR_DEPARTMENT_OTHER): Payer: Self-pay | Admitting: Cardiology

## 2021-11-23 VITALS — BP 128/74 | HR 78 | Ht 72.0 in | Wt 302.4 lb

## 2021-11-23 DIAGNOSIS — I10 Essential (primary) hypertension: Secondary | ICD-10-CM | POA: Diagnosis not present

## 2021-11-23 DIAGNOSIS — Z7189 Other specified counseling: Secondary | ICD-10-CM | POA: Diagnosis not present

## 2021-11-23 DIAGNOSIS — I5032 Chronic diastolic (congestive) heart failure: Secondary | ICD-10-CM

## 2021-11-23 DIAGNOSIS — E119 Type 2 diabetes mellitus without complications: Secondary | ICD-10-CM

## 2021-11-23 DIAGNOSIS — I4819 Other persistent atrial fibrillation: Secondary | ICD-10-CM | POA: Diagnosis not present

## 2021-11-23 DIAGNOSIS — Z79899 Other long term (current) drug therapy: Secondary | ICD-10-CM | POA: Diagnosis not present

## 2021-11-23 DIAGNOSIS — Z794 Long term (current) use of insulin: Secondary | ICD-10-CM | POA: Diagnosis not present

## 2021-11-23 MED ORDER — FUROSEMIDE 20 MG PO TABS: 20.0000 mg | ORAL_TABLET | Freq: Every day | ORAL | 3 refills | Status: DC

## 2021-11-23 MED ORDER — APIXABAN 5 MG PO TABS: 5.0000 mg | ORAL_TABLET | Freq: Two times a day (BID) | ORAL | 3 refills | Status: DC

## 2021-11-23 NOTE — Progress Notes (Signed)
?Cardiology Office Note:   ? ?Date:  11/23/2021  ? ?ID:  [name], DOB 07-27-1951, MRN [id number] ? ?PCP:  [name], DO  ?Cardiologist:  Buford Dresser, MD ? ?Referring MD: [name], DO  ? ?CC: new patient evaluation for hospitalization for shortness of breath ? ?History of Present Illness:   ? ?[name] is a 71 y.o. male with a hx of hypertension, hyperlipidemia, type II diabetes who is seen as a new consult at the request of [name], DO for the evaluation and management of hospitalization for shortness of breath. ? ?Hospital course reviewed, discharged 11/01/21. Noted to have new atrial fibrillation during admission, started on apixaban. Treated for acute diastolic heart failure during admission. ? ?Today: ?Feeling much better since hospitalization. Told many years ago he had an irregular heart beat, but didn't need medication and it went away on its own.  ? ?We discussed afib at length today. We discussed stroke prevention, rate vs rhythm control. Discussed cardioversion. ? ?Admits to eating a lot of salt, has cut back. Has a scale but not working. Discussed Alleviate trial, he is not interested. ? ?Denies chest pain, shortness of breath at rest or with normal exertion. No PND, orthopnea, LE edema or unexpected weight gain. No syncope or palpitations.  ? ?Past Medical History:  ?Diagnosis Date  ? Anxiety   ? Arthritis   ? Diabetes mellitus without complication ([place])   ? GERD (gastroesophageal reflux disease)   ? Hypercholesteremia   ? Hypertension   ? ? ?Past Surgical History:  ?Procedure Laterality Date  ? boils    ? removed form back of skull  ? CATARACT EXTRACTION W/PHACO Right 04/02/2013  ? Procedure: CATARACT EXTRACTION PHACO AND INTRAOCULAR LENS PLACEMENT (IOC);  Surgeon: Williams Che, MD;  Location: [facility];  Service: Ophthalmology;  Laterality: Right;  CDE 7.00  ? CATARACT EXTRACTION W/PHACO Left 10/04/2013  ? Procedure: CATARACT EXTRACTION PHACO AND  INTRAOCULAR LENS PLACEMENT (IOC);  Surgeon: Williams Che, MD;  Location: [facility];  Service: Ophthalmology;  Laterality: Left;  CDE:1.46  ? COLONOSCOPY N/A 07/03/2015  ? Procedure: COLONOSCOPY;  Surgeon: Rogene Houston, MD;  Location: [facility];  Service: Endoscopy;  Laterality: N/A;  830  ? EYE SURGERY Right   ? "valve replaced and stent placed" per pt to reroute the fluid in his eye  ? ROTATOR CUFF REPAIR Right   ? ? ?Current Medications: ?Current Outpatient Medications on File Prior to Visit  ?Medication Sig  ? ALPRAZolam (XANAX) 1 MG tablet Take 0.5-1 tablets (0.5-1 mg total) by mouth 3 (three) times daily as needed for anxiety (take ONLY IF NEEDED).  ? amLODipine (NORVASC) 5 MG tablet Take 1 tablet (5 mg total) by mouth daily.  ? Continuous Blood Gluc Sensor (FREESTYLE LIBRE 14 DAY SENSOR) MISC Scan to check glucose at least 4 times daily. DX: E11.4  ? famotidine (PEPCID) 20 MG tablet TAKE 1 TABLET BY MOUTH TWICE A DAY  ? glucose blood (ONETOUCH VERIO) test strip Use as instructed to monitor glucose 4 times daily (use as back up method for CGM)  ? insulin aspart (NOVOLOG FLEXPEN) 100 UNIT/ML FlexPen Inject 10-16 Units into the skin 3 (three) times daily with meals.  ? insulin degludec (TRESIBA FLEXTOUCH) 200 UNIT/ML FlexTouch Pen Inject 60 Units into the skin in the morning.  ? Insulin Pen Needle (B-D ULTRAFINE III SHORT PEN) 31G X 8 MM MISC Use to give insulin daily Dx E11.9  ?  Lancets (ONETOUCH ULTRASOFT) lancets Use as instructed to monitor glucose 4 times daily (use as back up method for CGM).  ? lisinopril (ZESTRIL) 40 MG tablet Take 1 tablet (40 mg total) by mouth daily.  ? metFORMIN (GLUCOPHAGE-XR) 500 MG 24 hr tablet TAKE 2 TABLETS BY MOUTH TWICE A DAY WITH FOOD  ? mirtazapine (REMERON) 45 MG tablet Take 1 tablet (45 mg total) by mouth at bedtime.  ? omeprazole (PRILOSEC) 20 MG capsule Take 20 mg by mouth daily.  ? rosuvastatin (CRESTOR) 20 MG tablet TAKE 1 TABLET BY MOUTH EVERYDAY AT BEDTIME  ?  sildenafil (VIAGRA) 50 MG tablet Take 1 tablet (50 mg total) by mouth daily as needed for erectile dysfunction.  ? ?No current facility-administered medications on file prior to visit.  ?  ? ?Allergies:   Patient has no known allergies.  ? ?Social History  ? ?Tobacco Use  ? Smoking status: Former  ?  Packs/day: 0.50  ?  Years: 41.00  ?  Pack years: 20.50  ?  Types: Cigarettes  ?  Quit date: 02/29/2012  ?  Years since quitting: 9.7  ? Smokeless tobacco: Never  ?Vaping Use  ? Vaping Use: Never used  ?Substance Use Topics  ? Alcohol use: No  ?  Comment: quit 2012  ? Drug use: No  ?  Comment: quit in 2012  ? ? ?Family History: ?family history includes Diabetes in his mother; Glaucoma in his mother. ? ?ROS:   ?Please see the history of present illness.  Additional pertinent ROS: ?Constitutional: Negative for chills, fever, night sweats, unintentional weight loss  ?HENT: Negative for ear pain and hearing loss.   ?Eyes: Negative for loss of vision and eye pain.  ?Respiratory: Negative for cough, sputum, wheezing.   ?Cardiovascular: See HPI. ?Gastrointestinal: Negative for abdominal pain, melena, and hematochezia.  ?Genitourinary: Negative for dysuria and hematuria.  ?Musculoskeletal: Negative for falls and myalgias.  ?Skin: Negative for itching and rash.  ?Neurological: Negative for focal weakness, focal sensory changes and loss of consciousness.  ?Endo/Heme/Allergies: Does not bruise/bleed easily.   ? ? ?EKGs/Labs/Other Studies Reviewed:   ? ?The following studies were reviewed today: ?Echo 10/30/21 ?1. Left ventricular ejection fraction, by estimation, is 60 to 65%. The  ?left ventricle has normal function. The left ventricle has no regional  ?wall motion abnormalities. There is mild left ventricular hypertrophy.  ?Left ventricular diastolic parameters  ?are indeterminate.  ? 2. Right ventricular systolic function is normal. The right ventricular  ?size is mildly enlarged.  ? 3. Left atrial size was mildly dilated.  ? 4.  The mitral valve is normal in structure. No evidence of mitral valve  ?regurgitation. No evidence of mitral stenosis.  ? 5. The aortic valve is tricuspid. There is mild calcification of the  ?aortic valve. Aortic valve regurgitation is not visualized. Aortic valve  ?sclerosis is present, with no evidence of aortic valve stenosis.  ? 6. The inferior vena cava is normal in size with greater than 50%  ?respiratory variability, suggesting right atrial pressure of 3 mmHg.  ? ?EKG:  EKG is personally reviewed.   ?11/23/21: atrial fibrillation at 78 bpm ? ?Recent Labs: ?10/29/2021: ALT 17; B Natriuretic Peptide 258.0; TSH 0.663 ?11/01/2021: Magnesium 2.1 ?11/06/2021: BUN 18; Creatinine, Ser 1.05; Hemoglobin 14.1; Platelets 252; Potassium 5.6; Sodium 143  ?Recent Lipid Panel ?   ?Component Value Date/Time  ? CHOL 103 06/11/2021 0958  ? TRIG 98 06/11/2021 0958  ? HDL 27 (L) 06/11/2021 0958  ?  CHOLHDL 3.8 06/11/2021 0958  ? [place] 57 06/11/2021 0958  ? ? ?Physical Exam:   ? ?VS:  BP 128/74 (BP Location: Left Arm, Patient Position: Sitting, Cuff Size: Large)   Pulse 78   Ht 6' (1.829 m)   Wt (!) 302 lb 6.4 oz (137.2 kg)   BMI 41.01 kg/m?    ? ?Wt Readings from Last 3 Encounters:  ?11/23/21 (!) 302 lb 6.4 oz (137.2 kg)  ?11/14/21 (!) 302 lb (137 kg)  ?11/06/21 298 lb 6.4 oz (135.4 kg)  ?  ?GEN: Well nourished, well developed in no acute distress ?HEENT: Normal, moist mucous membranes ?NECK: No JVD ?CARDIAC: irrregular rhythm, normal S1 and S2, no rubs or gallops. No murmur. ?VASCULAR: Radial and DP pulses 2+ bilaterally. No carotid bruits ?RESPIRATORY:  Clear to auscultation without rales, wheezing or rhonchi  ?ABDOMEN: Soft, non-tender, non-distended ?MUSCULOSKELETAL:  Ambulates independently ?SKIN: Warm and dry, no edema ?NEUROLOGIC:  Alert and oriented x 3. No focal neuro deficits noted. ?PSYCHIATRIC:  Normal affect   ? ?ASSESSMENT:   ? ?1. Persistent atrial fibrillation ([place])   ?2. Chronic diastolic heart failure ([place])    ?3. Essential hypertension   ?4. Morbid obesity ([place])   ?5. Type 2 diabetes mellitus without complication, with long-term current use of insulin ([place])   ?6. Cardiac risk counseling   ?7. Counseling on health promotio

## 2021-11-23 NOTE — Patient Instructions (Signed)
Medication Instructions:  ?STOP: Potassium 20 meq  daily ? ?*If you need a refill on your cardiac medications before your next appointment, please call your pharmacy* ? ? ?Lab Work: ?Your provider has recommended lab work today (BMP). Please have this collected at [facility] [place]. The lab is open 8:00 am - 4:30 pm. Please avoid 12:00p - 1:00p for lunch hour. You do not need an appointment. Please go to [address] [place], [place] [address]. This is in the Primary Care office on the 3rd floor, let them know you are there for blood work and they will direct you to the lab. ? ?If you have labs (blood work) drawn today and your tests are completely normal, you will receive your results only by: ?MyChart Message (if you have MyChart) OR ?A paper copy in the mail ?If you have any lab test that is abnormal or we need to change your treatment, we will call you to review the results. ? ? ?Testing/Procedures: ?None ordered today ? ? ?Follow-Up: ?At [facility], you and your health needs are our priority.  As part of our continuing mission to provide you with exceptional heart care, we have created designated Provider Care Teams.  These Care Teams include your primary Cardiologist (physician) and Advanced Practice Providers (APPs -  Physician Assistants and Nurse Practitioners) who all work together to provide you with the care you need, when you need it. ? ?We recommend signing up for the patient portal called "MyChart".  Sign up information is provided on this After Visit Summary.  MyChart is used to connect with patients for Virtual Visits (Telemedicine).  Patients are able to view lab/test results, encounter notes, upcoming appointments, etc.  Non-urgent messages can be sent to your provider as well.   ?To learn more about what you can do with MyChart, go to [email or web address].   ? ?Your next appointment:   ?6 month(s) ? ?The format for your next appointment:   ?In  Person ? ?Provider:   ?Buford Dresser, MD{ ? ?Important Information About Sugar ? ? ? ? ? ? ?Do the following things EVERY DAY: ? ?Weigh yourself EVERY morning after you go to the bathroom but before you eat or drink anything. Write this number down in a weight log/diary. If you gain 3 pounds overnight or 5 pounds in a week, call the office. ? ?Take your medicines as prescribed. If you have concerns about your medications, please call us before you stop taking them.  ? ?Eat low salt foods--Limit salt (sodium) to 2000 mg per day. This will help prevent your body from holding onto fluid. Read food labels as many processed foods have a lot of sodium, especially canned goods and prepackaged meats. If you would like some assistance choosing low sodium foods, we would be happy to set you up with a nutritionist. ? ?Stay as active as you can everyday. Staying active will give you more energy and make your muscles stronger. Start with 5 minutes at a time and work your way up to 30 minutes a day. Break up your activities--do some in the morning and some in the afternoon. Start with 3 days per week and work your way up to 5 days as you can.  If you have chest pain, feel short of breath, dizzy, or lightheaded, STOP. If you don't feel better after a short rest, call 911. If you do feel better, call the office to let us know you have symptoms with exercise. ? ?  Limit all fluids for the day to less than 2 liters. Fluid includes all drinks, coffee, juice, ice chips, soup, jello, and all other liquids.  ?

## 2021-11-24 LAB — BASIC METABOLIC PANEL
BUN/Creatinine Ratio: 23 (ref 10–24)
BUN: 21 mg/dL (ref 8–27)
CO2: 18 mmol/L — ABNORMAL LOW (ref 20–29)
Calcium: 9.8 mg/dL (ref 8.6–10.2)
Chloride: 106 mmol/L (ref 96–106)
Creatinine, Ser: 0.93 mg/dL (ref 0.76–1.27)
Glucose: 139 mg/dL — ABNORMAL HIGH (ref 70–99)
Potassium: 5 mmol/L (ref 3.5–5.2)
Sodium: 145 mmol/L — ABNORMAL HIGH (ref 134–144)
eGFR: 88 mL/min/{1.73_m2} (ref >59)

## 2021-11-30 ENCOUNTER — Other Ambulatory Visit: Payer: Self-pay | Admitting: Family Medicine

## 2021-11-30 DIAGNOSIS — R12 Heartburn: Secondary | ICD-10-CM

## 2021-12-04 DIAGNOSIS — H40033 Anatomical narrow angle, bilateral: Secondary | ICD-10-CM | POA: Diagnosis not present

## 2021-12-04 DIAGNOSIS — H2513 Age-related nuclear cataract, bilateral: Secondary | ICD-10-CM | POA: Diagnosis not present

## 2021-12-05 ENCOUNTER — Telehealth: Payer: Self-pay | Admitting: Pharmacist

## 2021-12-05 NOTE — Telephone Encounter (Signed)
REFILLS SENT TO [facility] [place] PATIENT ASSISTANCE PROGRAM FOR TRESIBA, NOVOLOG  ? ?REFILLS SHOULD ARRIVE IN 10-14 BUSINESS DAYS FROM APPROVAL ? ?PATIENT WAS GIVEN SAMPLES TO [employer] ?

## 2021-12-06 ENCOUNTER — Telehealth: Payer: Self-pay | Admitting: Family Medicine

## 2021-12-06 DIAGNOSIS — Z794 Long term (current) use of insulin: Secondary | ICD-10-CM | POA: Diagnosis not present

## 2021-12-06 DIAGNOSIS — E1140 Type 2 diabetes mellitus with diabetic neuropathy, unspecified: Secondary | ICD-10-CM | POA: Diagnosis not present

## 2021-12-10 ENCOUNTER — Other Ambulatory Visit: Payer: Self-pay | Admitting: Family Medicine

## 2021-12-10 ENCOUNTER — Ambulatory Visit: Payer: Medicare Other | Admitting: Family Medicine

## 2021-12-10 DIAGNOSIS — R12 Heartburn: Secondary | ICD-10-CM

## 2021-12-11 ENCOUNTER — Telehealth: Payer: Self-pay | Admitting: Family Medicine

## 2021-12-11 NOTE — Telephone Encounter (Signed)
[name] is giving patient some samples ?

## 2021-12-12 ENCOUNTER — Ambulatory Visit (INDEPENDENT_AMBULATORY_CARE_PROVIDER_SITE_OTHER): Payer: Medicare Other | Admitting: Pharmacist

## 2021-12-12 DIAGNOSIS — E113553 Type 2 diabetes mellitus with stable proliferative diabetic retinopathy, bilateral: Secondary | ICD-10-CM

## 2021-12-12 DIAGNOSIS — I152 Hypertension secondary to endocrine disorders: Secondary | ICD-10-CM

## 2021-12-12 NOTE — Telephone Encounter (Signed)
Humalog samples given to patient while awaiting patient assistance ?

## 2021-12-13 NOTE — Telephone Encounter (Signed)
[name], can you check on his application? ?[employer]isk ?Faxed on 12/05/21 ?2 different insulins-tresiba/novolog ?Wanted to make sure they accepted his bank statement  ?Thank you! ?

## 2021-12-13 NOTE — Progress Notes (Signed)
? ? ?Chronic Care Management ?Pharmacy Note ? ?12/12/2021 ?Name:  [name] MRN:  [id number] DOB:  24-Apr-1951 ? ?Summary: ?Diabetes: ?UNcontrolled; current treatment: TRESIBA 70-80 units daily, NOVOLOG 16-20 units with meals, metformin;  ?A1c INCREASED TO 8.1% ?Consider GLP1/sglt2  ?Adding back SGLT2 due to new onset heart failure ?Okay with cards, endocrine and PCP ?We are able to get farxiga free through patient assistance so will go this route ?Admits to only taking AM dose of metformin--encouraged patient to take as prescribed; strict with meal time insulin (take before meals) ?Follows with endocrine ?Current glucose readings: fasting glucose: <170, post prandial glucose: 150-200s ?Uses libre CGM/[facility] via [employer] portal ?Denies hypoglycemic/hyperglycemic symptoms ?Discussed meal planning options and Plate method for healthy eating ?Avoid sugary drinks and desserts ?Incorporate balanced protein, non starchy veggies, 1 serving of carbohydrate with each meal ?Increase water intake ?Increase physical activity as able ?Current exercise: n/a ?Assessed patient finances. Re-enrollment submitted to novo nordisk patient assistance program for 2023--patient may not qualify since he has 75% LIS now; if approved, medications will ship to PCP office; tresiba/novolog patient assistance supply shipped today--patient picked up ?Education provided on diet and lifestyle modifications ? ? ?Subjective: ?[name] is an 71 y.o. year old male who is a primary patient of [name], DO.  The CCM team was consulted for assistance with disease management and care coordination needs.   ? ?Engaged with patient face to face for follow up visit in response to provider referral for pharmacy case management and/or care coordination services.  ? ?Consent to Services:  ?The patient was given information about Chronic Care Management services, agreed to services, and gave verbal consent prior to initiation of services.   Please see initial visit note for detailed documentation.  ? ?Patient Care Team: ?[name], DO as PCP - General (Family Medicine) ?Buford Dresser, MD as PCP - Cardiology (Cardiology) ?Lavera Guise, [facility] (Pharmacist) ? ? ?Objective: ? ?Lab Results  ?Component Value Date  ? CREATININE 0.93 11/23/2021  ? CREATININE 1.05 11/06/2021  ? CREATININE 0.89 11/01/2021  ? ? ?Lab Results  ?Component Value Date  ? HGBA1C 8.1 (H) 10/29/2021  ? ?Last diabetic Eye exam:  ?Lab Results  ?Component Value Date/Time  ? HMDIABEYEEXA Retinopathy (A) 04/20/2019 12:00 AM  ?  ?Last diabetic Foot exam: No results found for: HMDIABFOOTEX  ? ?   ?Component Value Date/Time  ? CHOL 103 06/11/2021 0958  ? TRIG 98 06/11/2021 0958  ? HDL 27 (L) 06/11/2021 0958  ? CHOLHDL 3.8 06/11/2021 0958  ? [place] 57 06/11/2021 0958  ? ? ? ?  Latest Ref Rng & Units 10/29/2021  ?  8:10 AM 06/11/2021  ?  9:58 AM 10/30/2020  ?  7:56 AM  ?Hepatic Function  ?Total Protein 6.5 - 8.1 g/dL 8.2   7.4   7.7    ?Albumin 3.5 - 5.0 g/dL 3.7   4.1   4.1    ?AST 15 - 41 U/L 24   34   26    ?ALT 0 - 44 U/L 17   32   20    ?Alk Phosphatase 38 - 126 U/L 87   141   102    ?Total Bilirubin 0.3 - 1.2 mg/dL 0.8   0.7   0.3    ? ? ?Lab Results  ?Component Value Date/Time  ? TSH 0.663 10/29/2021 08:10 AM  ? TSH 0.280 (L) 07/05/2019 09:19 AM  ? TSH 0.124 (L) 04/28/2019 08:19  AM  ? ? ? ?  Latest Ref Rng & Units 11/06/2021  ? 12:14 PM 10/30/2021  ?  5:28 AM 10/29/2021  ?  8:10 AM  ?CBC  ?WBC 3.4 - 10.8 x10E3/uL 6.2   6.5   6.6    ?Hemoglobin 13.0 - 17.7 g/dL 14.1   12.4   12.4    ?Hematocrit 37.5 - 51.0 % 45.7   40.8   41.7    ?Platelets 150 - 450 x10E3/uL 252   220   207    ? ? ?Lab Results  ?Component Value Date/Time  ? VD25OH 49.1 04/06/2019 10:36 AM  ? ? ?Clinical ASCVD: No  ?The ASCVD Risk score (Arnett DK, et al., 2019) failed to calculate for the following reasons: ?  The valid total cholesterol range is 130 to 320 mg/dL   ? ?Other: (CHADS2VASc if Afib, PHQ9 if  depression, MMRC or CAT for COPD, ACT, DEXA) ? ?Social History  ? ?Tobacco Use  ?Smoking Status Former  ? Packs/day: 0.50  ? Years: 41.00  ? Pack years: 20.50  ? Types: Cigarettes  ? Quit date: 02/29/2012  ? Years since quitting: 9.8  ?Smokeless Tobacco Never  ? ?BP Readings from Last 3 Encounters:  ?11/23/21 128/74  ?11/14/21 136/72  ?11/06/21 133/74  ? ?Pulse Readings from Last 3 Encounters:  ?11/23/21 78  ?11/14/21 60  ?11/06/21 86  ? ?Wt Readings from Last 3 Encounters:  ?11/23/21 (!) 302 lb 6.4 oz (137.2 kg)  ?11/14/21 (!) 302 lb (137 kg)  ?11/06/21 298 lb 6.4 oz (135.4 kg)  ? ? ?Assessment: Review of patient past medical history, allergies, medications, health status, including review of consultants reports, laboratory and other test data, was performed as part of comprehensive evaluation and provision of chronic care management services.  ? ?SDOH:  (Social Determinants of Health) assessments and interventions performed:  ? ? ?CCM Care Plan ? ?No Known Allergies ? ?Medications Reviewed Today   ? ? Reviewed by Lavera Guise, [facility] (Pharmacist) on 12/13/21 at 65  Med List Status: <None>  ? ?Medication Order Taking? Sig Documenting Provider Last Dose Status Informant  ?ALPRAZolam (XANAX) 1 MG tablet [id number] No Take 0.5-1 tablets (0.5-1 mg total) by mouth 3 (three) times daily as needed for anxiety (take ONLY IF NEEDED). Ronnie Doss M, DO Taking Active   ?amLODipine (NORVASC) 5 MG tablet [id number] No Take 1 tablet (5 mg total) by mouth daily. Ronnie Doss M, DO Taking Active Self  ?apixaban (ELIQUIS) 5 MG TABS tablet [id number]  Take 1 tablet (5 mg total) by mouth 2 (two) times daily. Buford Dresser, MD  Active   ?Continuous Blood Gluc Sensor (FREESTYLE LIBRE 14 DAY SENSOR) [place] [id number] No Scan to check glucose at least 4 times daily. DX: E11.4 [name], DO Taking Active Self  ?famotidine (PEPCID) 20 MG tablet [id number]  TAKE 1 TABLET BY MOUTH TWICE A DAY Gottschalk, Ashly M, DO   Active   ?furosemide (LASIX) 20 MG tablet [id number]  Take 1 tablet (20 mg total) by mouth daily. Buford Dresser, MD  Active   ?glucose blood [facility] VERIO) test strip [id number] No Use as instructed to monitor glucose 4 times daily (use as back up method for CGM) Brita Romp, NP Taking Active Self  ?insulin aspart (NOVOLOG FLEXPEN) 100 UNIT/ML FlexPen [id number] No Inject 10-16 Units into the skin 3 (three) times daily with meals. [provider] Taking Active Self  ?         ?Med  Note Blanca Friend, Mckinzy Fuller D   Tue Jun 05, 2021  2:59 PM) Via novo nordisk patient assistance program  ?insulin degludec (TRESIBA FLEXTOUCH) 200 UNIT/ML FlexTouch Pen [id number] No Inject 60 Units into the skin in the morning. Brita Romp, NP Taking Active Self  ?         ?Med Note Lavera Guise   Tue Jun 05, 2021  2:56 PM) Via novo nordisk patient assistance program  ?Insulin Pen Needle (B-D ULTRAFINE III SHORT PEN) 31G X 8 MM MISC [id number] No Use to give insulin daily Dx E11.9 Baruch Gouty, FNP Taking Active Self  ?Lancets [facility] ULTRASOFT) lancets [id number] No Use as instructed to monitor glucose 4 times daily (use as back up method for CGM). Brita Romp, NP Taking Active Self  ?lisinopril (ZESTRIL) 40 MG tablet [id number] No Take 1 tablet (40 mg total) by mouth daily. Ronnie Doss M, DO Taking Active Self  ?metFORMIN (GLUCOPHAGE-XR) 500 MG 24 hr tablet [id number] No TAKE 2 TABLETS BY MOUTH TWICE A DAY WITH FOOD Ronnie Doss M, DO Taking Active Self  ?         ?Med Note Clydell Hakim   Mon Oct 29, 2021  9:58 AM)    ?mirtazapine (REMERON) 45 MG tablet [id number] No Take 1 tablet (45 mg total) by mouth at bedtime. [name], DO Taking Active Self  ?omeprazole (PRILOSEC) 20 MG capsule [id number] No Take 20 mg by mouth daily. [provider] Taking Active Self  ?rosuvastatin (CRESTOR) 20 MG tablet [id number] No TAKE 1 TABLET BY MOUTH EVERYDAY AT BEDTIME Ronnie Doss M,  DO Taking Active Self  ?sildenafil (VIAGRA) 50 MG tablet [id number] No Take 1 tablet (50 mg total) by mouth daily as needed for erectile dysfunction. [name], DO Taking Active Self  ?Med List Note

## 2021-12-13 NOTE — Patient Instructions (Addendum)
Visit Information ? ?Following are the goals we discussed today:  ?Current Barriers:  ?Unable to independently afford treatment regimen ?Unable to maintain control of T2DM ? ?Pharmacist Clinical Goal(s):  ?Over the next 90 days, patient will verbalize ability to afford treatment regimen ?maintain control of T2DM as evidenced by [place]  through collaboration with PharmD and provider.  ? ? ?Interventions: ?1:1 collaboration with [name], DO regarding development and update of comprehensive plan of care as evidenced by provider attestation and co-signature ?Inter-disciplinary care team collaboration (see longitudinal plan of care) ?Comprehensive medication review performed; medication list updated in electronic medical record ? ?Diabetes: ?UNcontrolled; current treatment: TRESIBA 70-80 units daily, NOVOLOG 16-20 units with meals, metformin;  ?A1c INCREASED TO 8.1% ?Consider GLP1/sglt2  ?Adding back SGLT2 due to new onset heart failure ?Okay with cards, endocrine and PCP ?We are able to get farxiga free through patient assistance so will go this route ?Admits to only taking AM dose of metformin--encouraged patient to take as prescribed; strict with meal time insulin (take before meals) ?Follows with endocrine ?Current glucose readings: fasting glucose: <170, post prandial glucose: 150-200s ?Uses libre CGM/[facility] via [employer] portal ?Denies hypoglycemic/hyperglycemic symptoms ?Discussed meal planning options and Plate method for healthy eating ?Avoid sugary drinks and desserts ?Incorporate balanced protein, non starchy veggies, 1 serving of carbohydrate with each meal ?Increase water intake ?Increase physical activity as able ?Current exercise: n/a ?Assessed patient finances. Re-enrollment submitted to novo nordisk patient assistance program for 2023--patient may not qualify since he has 75% LIS now; if approved, medications will ship to PCP office; tresiba/novolog patient  assistance supply shipped today--patient picked up ?Education provided on diet and lifestyle modifications ? ? ?Patient Goals/Self-Care Activities ?Over the next 90 days, patient will:  ?- take medications as prescribed ?check glucose 3 times daily or if symptomatic, document, and provide at future appointments ? ?Follow Up Plan: Telephone follow up appointment with care management team member scheduled for: 3 months ? ? ?Plan: Telephone follow up appointment with care management team member scheduled for:  3 months ? ?Signature ?Regina Eck, PharmD, BCPS ?Clinical Pharmacist, [facility] ?[place] Phone [phone] ? ? ?Please call the care guide team at [phone] if you need to cancel or reschedule your appointment.  ? ?The patient verbalized understanding of instructions, educational materials, and care plan provided today and declined offer to receive copy of patient instructions, educational materials, and care plan.  ? ?

## 2021-12-14 ENCOUNTER — Telehealth: Payer: Self-pay

## 2021-12-14 NOTE — Telephone Encounter (Signed)
Received notification from [place] regarding approval for TRESIBA & NOVOLOG. Patient assistance approved UNTIL 07/04/22. ? ?MEDICATION WILL SHIP TO OFFICE ? ?Phone: [phone] ? ?

## 2021-12-14 NOTE — Telephone Encounter (Signed)
CREATED IN ERROR

## 2021-12-18 MED ORDER — DAPAGLIFLOZIN PROPANEDIOL 10 MG PO TABS: 10.0000 mg | ORAL_TABLET | Freq: Every day | ORAL | 5 refills | Status: DC

## 2021-12-26 ENCOUNTER — Telehealth: Payer: Self-pay

## 2021-12-26 NOTE — Telephone Encounter (Signed)
Received notification from [place] regarding approval for [facility]. Patient assistance approved from 12/25/21 to 08/04/22.  ALLOW 7-10 BUSINESS DAYS FOR MEDICINE TO [facility]  Phone: [phone]

## 2022-01-02 DIAGNOSIS — I152 Hypertension secondary to endocrine disorders: Secondary | ICD-10-CM

## 2022-01-02 DIAGNOSIS — E1159 Type 2 diabetes mellitus with other circulatory complications: Secondary | ICD-10-CM

## 2022-01-02 DIAGNOSIS — E113553 Type 2 diabetes mellitus with stable proliferative diabetic retinopathy, bilateral: Secondary | ICD-10-CM

## 2022-01-02 DIAGNOSIS — Z794 Long term (current) use of insulin: Secondary | ICD-10-CM

## 2022-01-06 DIAGNOSIS — Z794 Long term (current) use of insulin: Secondary | ICD-10-CM | POA: Diagnosis not present

## 2022-01-06 DIAGNOSIS — E1140 Type 2 diabetes mellitus with diabetic neuropathy, unspecified: Secondary | ICD-10-CM | POA: Diagnosis not present

## 2022-01-18 ENCOUNTER — Ambulatory Visit (INDEPENDENT_AMBULATORY_CARE_PROVIDER_SITE_OTHER): Payer: Medicare Other | Admitting: Pharmacist

## 2022-01-18 DIAGNOSIS — I2510 Atherosclerotic heart disease of native coronary artery without angina pectoris: Secondary | ICD-10-CM

## 2022-01-18 DIAGNOSIS — Z794 Long term (current) use of insulin: Secondary | ICD-10-CM

## 2022-01-24 DIAGNOSIS — E1140 Type 2 diabetes mellitus with diabetic neuropathy, unspecified: Secondary | ICD-10-CM | POA: Diagnosis not present

## 2022-01-24 DIAGNOSIS — Z794 Long term (current) use of insulin: Secondary | ICD-10-CM | POA: Diagnosis not present

## 2022-01-25 NOTE — Progress Notes (Signed)
Chronic Care Management Pharmacy Note  01/18/2022 Name:  [name] MRN:  [id number] DOB:  1951/07/19  Summary:  Diabetes: UNcontrolled; current treatment: TRESIBA 70-80 units daily, NOVOLOG 16-20 units with meals, metformin, FARXIGA 10MG  DAILY;  A1c 8.1% Consider GLP1/sglt2  Adding back SGLT2 due to new onset heart failure--PATIENT TO START THIS WEEK Okay with cards, endocrine and PCP We are able to get farxiga free through patient assistance so will go this route Admits to only taking AM dose of metformin--encouraged patient to take as prescribed; strict with meal time insulin (take before meals) Follows with endocrine Current glucose readings: fasting glucose: <170, post prandial glucose: 150-200s Uses libre CGM/[facility] via [facility] portal Denies hypoglycemic/hyperglycemic symptoms Discussed meal planning options and Plate method for healthy eating Avoid sugary drinks and desserts Incorporate balanced protein, non starchy veggies, 1 serving of carbohydrate with each meal Increase water intake Increase physical activity as able Current exercise: n/a Assessed patient finances. Re-enrollment submitted to novo nordisk patient assistance program for 2023--patient may not qualify since he has 75% LIS now; if approved, medications will ship to PCP office; tresiba/novolog patient assistance supply shipped today--patient picked up Education provided on diet and lifestyle modifications   Subjective: [name] is an 71 y.o. year old male who is a primary patient of Raliegh Ip, DO.  The CCM team was consulted for assistance with disease management and care coordination needs.    Engaged with patient by telephone for follow up visit in response to provider referral for pharmacy case management and/or care coordination services.   Consent to Services:  The patient was given information about Chronic Care Management services, agreed to services, and gave verbal  consent prior to initiation of services.  Please see initial visit note for detailed documentation.   Patient Care Team: Raliegh Ip, DO as PCP - General (Family Medicine) Jodelle Red, MD as PCP - Cardiology (Cardiology) Danella Maiers, [facility] (Pharmacist)  Objective:  Lab Results  Component Value Date   CREATININE 0.93 11/23/2021   CREATININE 1.05 11/06/2021   CREATININE 0.89 11/01/2021    Lab Results  Component Value Date   HGBA1C 8.1 (H) 10/29/2021   Last diabetic Eye exam:  Lab Results  Component Value Date/Time   HMDIABEYEEXA Retinopathy (A) 04/20/2019 12:00 AM    Last diabetic Foot exam: No results found for: "HMDIABFOOTEX"      Component Value Date/Time   CHOL 103 06/11/2021 0958   TRIG 98 06/11/2021 0958   HDL 27 (L) 06/11/2021 0958   CHOLHDL 3.8 06/11/2021 0958   LDLCALC 57 06/11/2021 0958       Latest Ref Rng & Units 10/29/2021    8:10 AM 06/11/2021    9:58 AM 10/30/2020    7:56 AM  Hepatic Function  Total Protein 6.5 - 8.1 g/dL 8.2  7.4  7.7   Albumin 3.5 - 5.0 g/dL 3.7  4.1  4.1   AST 15 - 41 U/L 24  34  26   ALT 0 - 44 U/L 17  32  20   Alk Phosphatase 38 - 126 U/L 87  141  102   Total Bilirubin 0.3 - 1.2 mg/dL 0.8  0.7  0.3     Lab Results  Component Value Date/Time   TSH 0.663 10/29/2021 08:10 AM   TSH 0.280 (L) 07/05/2019 09:19 AM   TSH 0.124 (L) 04/28/2019 08:19 AM       Latest Ref Rng & Units 11/06/2021  12:14 PM 10/30/2021    5:28 AM 10/29/2021    8:10 AM  CBC  WBC 3.4 - 10.8 x10E3/uL 6.2  6.5  6.6   Hemoglobin 13.0 - 17.7 g/dL 16.1  09.6  04.5   Hematocrit 37.5 - 51.0 % 45.7  40.8  41.7   Platelets 150 - 450 x10E3/uL 252  220  207     Lab Results  Component Value Date/Time   VD25OH 49.1 04/06/2019 10:36 AM    Clinical ASCVD: No  The ASCVD Risk score (Arnett DK, et al., 2019) failed to calculate for the following reasons:   The valid total cholesterol range is 130 to 320 mg/dL    Other: ([id number] if Afib,  PHQ9 if depression, MMRC or CAT for COPD, ACT, DEXA)  Social History   Tobacco Use  Smoking Status Former   Packs/day: 0.50   Years: 41.00   Total pack years: 20.50   Types: Cigarettes   Quit date: 02/29/2012   Years since quitting: 9.9  Smokeless Tobacco Never   BP Readings from Last 3 Encounters:  11/23/21 128/74  11/14/21 136/72  11/06/21 133/74   Pulse Readings from Last 3 Encounters:  11/23/21 78  11/14/21 60  11/06/21 86   Wt Readings from Last 3 Encounters:  11/23/21 (!) 302 lb 6.4 oz (137.2 kg)  11/14/21 (!) 302 lb (137 kg)  11/06/21 298 lb 6.4 oz (135.4 kg)    Assessment: Review of patient past medical history, allergies, medications, health status, including review of consultants reports, laboratory and other test data, was performed as part of comprehensive evaluation and provision of chronic care management services.   SDOH:  (Social Determinants of Health) assessments and interventions performed:    CCM Care Plan  No Known Allergies  Medications Reviewed Today     Reviewed by Danella Maiers, [facility] (Pharmacist) on 12/13/21 at 1002  Med List Status: <None>   Medication Order Taking? Sig Documenting Provider Last Dose Status Informant  ALPRAZolam (XANAX) 1 MG tablet [id number] No Take 0.5-1 tablets (0.5-1 mg total) by mouth 3 (three) times daily as needed for anxiety (take ONLY IF NEEDED). Delynn Flavin M, DO Taking Active   amLODipine (NORVASC) 5 MG tablet [id number] No Take 1 tablet (5 mg total) by mouth daily. Delynn Flavin M, DO Taking Active Self  apixaban (ELIQUIS) 5 MG TABS tablet [id number]  Take 1 tablet (5 mg total) by mouth 2 (two) times daily. Jodelle Red, MD  Active   Continuous Blood Gluc Sensor (FREESTYLE LIBRE 14 DAY SENSOR) [place] [id number] No Scan to check glucose at least 4 times daily. DX: E11.4 Raliegh Ip, DO Taking Active Self  famotidine (PEPCID) 20 MG tablet [id number]  TAKE 1 TABLET BY MOUTH TWICE A DAY Gottschalk,  Ashly M, DO  Active   furosemide (LASIX) 20 MG tablet [id number]  Take 1 tablet (20 mg total) by mouth daily. Jodelle Red, MD  Active   glucose blood [facility] VERIO) test strip [id number] No Use as instructed to monitor glucose 4 times daily (use as back up method for CGM) Dani Gobble, NP Taking Active Self  insulin aspart (NOVOLOG FLEXPEN) 100 UNIT/ML FlexPen [id number] No Inject 10-16 Units into the skin 3 (three) times daily with meals. [provider] Taking Active Self           Med Note Cresenciano Genre, Lilla Shook   Tue Jun 05, 2021  2:59 PM) Via novo nordisk patient assistance program  insulin degludec (TRESIBA FLEXTOUCH) 200 UNIT/ML  FlexTouch Pen [id number] No Inject 60 Units into the skin in the morning. Dani Gobble, NP Taking Active Self           Med Note Danella Maiers   Tue Jun 05, 2021  2:56 PM) Via novo nordisk patient assistance program  Insulin Pen Needle (B-D ULTRAFINE III SHORT PEN) 31G X 8 MM MISC [id number] No Use to give insulin daily Dx E11.9 Sonny Masters, FNP Taking Active Self  Lancets [facility] ULTRASOFT) lancets [id number] No Use as instructed to monitor glucose 4 times daily (use as back up method for CGM). Dani Gobble, NP Taking Active Self  lisinopril (ZESTRIL) 40 MG tablet [id number] No Take 1 tablet (40 mg total) by mouth daily. Delynn Flavin M, DO Taking Active Self  metFORMIN (GLUCOPHAGE-XR) 500 MG 24 hr tablet [id number] No TAKE 2 TABLETS BY MOUTH TWICE A DAY WITH FOOD Raliegh Ip, DO Taking Active Self           Med Note Sharlene Dory   Mon Oct 29, 2021  9:58 AM)    mirtazapine (REMERON) 45 MG tablet [id number] No Take 1 tablet (45 mg total) by mouth at bedtime. Delynn Flavin M, DO Taking Active Self  omeprazole (PRILOSEC) 20 MG capsule [id number] No Take 20 mg by mouth daily. [provider] Taking Active Self  rosuvastatin (CRESTOR) 20 MG tablet [id number] No TAKE 1 TABLET BY MOUTH EVERYDAY AT BEDTIME  Delynn Flavin M, DO Taking Active Self  sildenafil (VIAGRA) 50 MG tablet [id number] No Take 1 tablet (50 mg total) by mouth daily as needed for erectile dysfunction. Raliegh Ip, DO Taking Active Self  Med List Note Milana Obey 03/31/13 1303): Lisinopril (strength)             Patient Active Problem List   Diagnosis Date Noted   Acute CHF (HCC) 10/29/2021   Atrial fibrillation (HCC) 10/29/2021   Proliferative diabetic retinopathy of left eye with macular edema associated with type 2 diabetes mellitus (HCC) 11/09/2019   Diabetic macular edema of right eye with proliferative retinopathy associated with type 2 diabetes mellitus (HCC) 11/09/2019   Optic atrophy associated with retinal dystrophies 11/09/2019   Primary open angle glaucoma of both eyes, severe stage 11/09/2019   Low TSH level 07/07/2019   Diabetic retinopathy of both eyes (HCC) 07/07/2019   Constipation due to pain medication 04/06/2019   Morbid obesity (HCC) 04/06/2019   Controlled substance agreement signed 04/06/2019   Corneal edema 12/16/2018   Diabetes mellitus (HCC) 10/12/2018   Primary open angle glaucoma (POAG) of right eye, severe stage 09/15/2017   Coronary artery disease due to lipid rich plaque 10/28/2016   Gastroesophageal reflux disease without esophagitis 01/30/2016   Chronic bilateral low back pain without sciatica 01/03/2015   Hypertension associated with diabetes (HCC) 01/03/2015   Primary osteoarthritis involving multiple joints 01/03/2015   Vitamin D deficiency 01/03/2015   GAD (generalized anxiety disorder) 01/03/2015   Erectile dysfunction associated with type 2 diabetes mellitus (HCC) 12/21/2013    Immunization History  Administered Date(s) Administered   Fluad Quad(high Dose 65+) 04/30/2017, 04/13/2019   Influenza Split 04/19/2014, 05/29/2020   Influenza-Unspecified 04/22/2018, 05/24/2020, 05/19/2021   Moderna SARS-COV2 Booster Vaccination 12/13/2020   Moderna  Sars-Covid-2 Vaccination 09/30/2019, 10/29/2019, 06/24/2020, 12/13/2020   Pneumococcal Conjugate-13 04/23/2018   Pneumococcal Polysaccharide-23 12/28/2015   Zoster, Live 01/04/2016    Conditions to be addressed/monitored: DMII  Care Plan : PHARMD MEDICATION MANAGEMENT  Updates made by Cresenciano Genre,  Lilla Shook, RPH since 01/25/2022 12:00 AM     Problem: DISEASE PROGRESSION PREVENTION      Goal: T2DM, CHF   Recent Progress: Not on track  Priority: High  Note:   Current Barriers:  Unable to independently afford treatment regimen Unable to maintain control of T2DM  Pharmacist Clinical Goal(s):  Over the next 90 days, patient will verbalize ability to afford treatment regimen maintain control of T2DM as evidenced by CONTINUED GLYCEMIC CONTROL  through collaboration with PharmD and provider.    Interventions: 1:1 collaboration with Raliegh Ip, DO regarding development and update of comprehensive plan of care as evidenced by provider attestation and co-signature Inter-disciplinary care team collaboration (see longitudinal plan of care) Comprehensive medication review performed; medication list updated in electronic medical record  Diabetes: UNcontrolled; current treatment: TRESIBA 70-80 units daily, NOVOLOG 16-20 units with meals, metformin, FARXIGA 10MG  DAILY;  A1c 8.1% Consider GLP1/sglt2  Adding back SGLT2 due to new onset heart failure--PATIENT TO START THIS WEEK Okay with cards, endocrine and PCP We are able to get farxiga free through patient assistance so will go this route Admits to only taking AM dose of metformin--encouraged patient to take as prescribed; strict with meal time insulin (take before meals) Follows with endocrine Current glucose readings: fasting glucose: <170, post prandial glucose: 150-200s Uses libre CGM/[facility] via parachute portal Denies hypoglycemic/hyperglycemic symptoms Discussed meal planning options and Plate method for healthy  eating Avoid sugary drinks and desserts Incorporate balanced protein, non starchy veggies, 1 serving of carbohydrate with each meal Increase water intake Increase physical activity as able Current exercise: n/a Assessed patient finances. Re-enrollment submitted to novo nordisk patient assistance program for 2023--patient may not qualify since he has 75% LIS now; if approved, medications will ship to PCP office; tresiba/novolog patient assistance supply shipped today--patient picked up Education provided on diet and lifestyle modifications   Patient Goals/Self-Care Activities Over the next 90 days, patient will:  - take medications as prescribed check glucose 3 times daily or if symptomatic, document, and provide at future appointments  Follow Up Plan: Telephone follow up appointment with care management team member scheduled for: 3 months       Kieth Brightly, PharmD, BCPS Clinical Pharmacist, [facility]  II Phone [phone]

## 2022-01-31 ENCOUNTER — Other Ambulatory Visit: Payer: Self-pay | Admitting: Family Medicine

## 2022-02-01 DIAGNOSIS — I2510 Atherosclerotic heart disease of native coronary artery without angina pectoris: Secondary | ICD-10-CM

## 2022-02-01 DIAGNOSIS — Z794 Long term (current) use of insulin: Secondary | ICD-10-CM

## 2022-02-01 DIAGNOSIS — I2583 Coronary atherosclerosis due to lipid rich plaque: Secondary | ICD-10-CM

## 2022-02-01 DIAGNOSIS — E113553 Type 2 diabetes mellitus with stable proliferative diabetic retinopathy, bilateral: Secondary | ICD-10-CM

## 2022-02-12 ENCOUNTER — Ambulatory Visit (INDEPENDENT_AMBULATORY_CARE_PROVIDER_SITE_OTHER): Payer: Medicare Other | Admitting: Family Medicine

## 2022-02-12 ENCOUNTER — Encounter: Payer: Self-pay | Admitting: Family Medicine

## 2022-02-12 VITALS — BP 139/78 | HR 83 | Temp 97.8°F | Ht 72.0 in | Wt 298.2 lb

## 2022-02-12 DIAGNOSIS — R12 Heartburn: Secondary | ICD-10-CM

## 2022-02-12 DIAGNOSIS — I152 Hypertension secondary to endocrine disorders: Secondary | ICD-10-CM | POA: Diagnosis not present

## 2022-02-12 DIAGNOSIS — I480 Paroxysmal atrial fibrillation: Secondary | ICD-10-CM

## 2022-02-12 DIAGNOSIS — E1140 Type 2 diabetes mellitus with diabetic neuropathy, unspecified: Secondary | ICD-10-CM | POA: Diagnosis not present

## 2022-02-12 DIAGNOSIS — E785 Hyperlipidemia, unspecified: Secondary | ICD-10-CM | POA: Diagnosis not present

## 2022-02-12 DIAGNOSIS — E1169 Type 2 diabetes mellitus with other specified complication: Secondary | ICD-10-CM | POA: Diagnosis not present

## 2022-02-12 DIAGNOSIS — E1159 Type 2 diabetes mellitus with other circulatory complications: Secondary | ICD-10-CM | POA: Diagnosis not present

## 2022-02-12 DIAGNOSIS — Z794 Long term (current) use of insulin: Secondary | ICD-10-CM

## 2022-02-12 DIAGNOSIS — L84 Corns and callosities: Secondary | ICD-10-CM | POA: Diagnosis not present

## 2022-02-12 DIAGNOSIS — E113553 Type 2 diabetes mellitus with stable proliferative diabetic retinopathy, bilateral: Secondary | ICD-10-CM | POA: Diagnosis not present

## 2022-02-12 DIAGNOSIS — Z23 Encounter for immunization: Secondary | ICD-10-CM

## 2022-02-12 DIAGNOSIS — F411 Generalized anxiety disorder: Secondary | ICD-10-CM

## 2022-02-12 LAB — BAYER DCA HB A1C WAIVED: HB A1C (BAYER DCA - WAIVED): 7.7 % — ABNORMAL HIGH (ref 4.8–5.6)

## 2022-02-12 MED ORDER — AMLODIPINE BESYLATE 5 MG PO TABS: 5.0000 mg | ORAL_TABLET | Freq: Every day | ORAL | 3 refills | Status: DC

## 2022-02-12 MED ORDER — METFORMIN HCL ER 500 MG PO TB24: ORAL_TABLET | ORAL | 3 refills | Status: DC

## 2022-02-12 MED ORDER — MIRTAZAPINE 45 MG PO TABS: 45.0000 mg | ORAL_TABLET | Freq: Every day | ORAL | 3 refills | Status: DC

## 2022-02-12 MED ORDER — LISINOPRIL 40 MG PO TABS: 40.0000 mg | ORAL_TABLET | Freq: Every day | ORAL | 3 refills | Status: DC

## 2022-02-12 MED ORDER — ROSUVASTATIN CALCIUM 20 MG PO TABS: ORAL_TABLET | ORAL | 3 refills | Status: DC

## 2022-02-12 MED ORDER — FAMOTIDINE 20 MG PO TABS: 20.0000 mg | ORAL_TABLET | Freq: Two times a day (BID) | ORAL | 3 refills | Status: DC

## 2022-02-12 MED ORDER — ALPRAZOLAM 1 MG PO TABS: 0.5000 mg | ORAL_TABLET | Freq: Three times a day (TID) | ORAL | 1 refills | Status: DC | PRN

## 2022-02-12 NOTE — Progress Notes (Signed)
Subjective: CC:Dm PCP: Charles Norlander, DO IOM:BTDHR [name] is a 71 y.o. male presenting to clinic today for:  1. Type 2 Diabetes with hypertension, hyperlipidemia:  Reports compliance with Wilder Glade, tresiba (60u) and Novolog 10-16u TID w/ meals.  He denies any hypoglycemic episodes but reports that he has been having more reasonable blood sugars.  The last several days he has not had anything from 99-106.  He has been really pleased with these levels.  He reports good urine output.  Denies any dysuria, genital discoloration or pain.  He has really been trying to watch soda intake and has subsequently lost about 10 pounds since his last visit.  He is extremely pleased with this.  Continues to try and walk daily but sometimes that he is prohibitive  Last eye exam: ROI completed Last foot exam: Needs Last A1c:  Lab Results  Component Value Date   HGBA1C 7.7 (H) 02/12/2022   Nephropathy screen indicated?:  On ACE inhibitor Last flu, zoster and/or pneumovax:  Immunization History  Administered Date(s) Administered   Fluad Quad(high Dose 65+) 04/30/2017, 04/13/2019   Influenza Split 04/19/2014, 05/29/2020   Influenza-Unspecified 05/18/2014, 05/09/2015, 04/22/2018, 05/24/2020, 05/19/2021   Moderna SARS-COV2 Booster Vaccination 12/13/2020   Moderna Sars-Covid-2 Vaccination 09/30/2019, 10/29/2019, 06/24/2020, 12/13/2020   Pneumococcal Conjugate-13 04/23/2018   Pneumococcal Polysaccharide-23 12/28/2015   Zoster Recombinat (Shingrix) 02/12/2022   Zoster, Live 01/04/2016    ROS: Denies dizziness, LOC, polyuria, polydipsia, unintended weight loss/gain, foot ulcerations, numbness or tingling in extremities, shortness of breath or chest pain.  2.  Anxiety disorder with panic Patient is compliant with mirtazapine 45 mg daily.  He uses Xanax about once per day and occasionally twice per day but overall he is really been trying to limit his use of that.  He denies any excessive daytime  sedation, falls, visual or auditory hallucinations.  No memory changes.   ROS: Per HPI  No Known Allergies Past Medical History:  Diagnosis Date   Anxiety    Arthritis    Diabetes mellitus without complication (HCC)    GERD (gastroesophageal reflux disease)    Hypercholesteremia    Hypertension     Current Outpatient Medications:    ALPRAZolam (XANAX) 1 MG tablet, Take 0.5-1 tablets (0.5-1 mg total) by mouth 3 (three) times daily as needed for anxiety (take ONLY IF NEEDED)., Disp: 90 tablet, Rfl: 1   amLODipine (NORVASC) 5 MG tablet, Take 1 tablet (5 mg total) by mouth daily., Disp: 90 tablet, Rfl: 3   apixaban (ELIQUIS) 5 MG TABS tablet, Take 1 tablet (5 mg total) by mouth 2 (two) times daily., Disp: 180 tablet, Rfl: 3   Continuous Blood Gluc Sensor (FREESTYLE LIBRE 14 DAY SENSOR) MISC, Scan to check glucose at least 4 times daily. DX: E11.4, Disp: 2 each, Rfl: 12   dapagliflozin propanediol (FARXIGA) 10 MG TABS tablet, Take 1 tablet (10 mg total) by mouth daily before breakfast., Disp: 90 tablet, Rfl: 5   famotidine (PEPCID) 20 MG tablet, TAKE 1 TABLET BY MOUTH TWICE A DAY, Disp: 180 tablet, Rfl: 0   furosemide (LASIX) 20 MG tablet, Take 1 tablet (20 mg total) by mouth daily., Disp: 90 tablet, Rfl: 3   glucose blood (ONETOUCH VERIO) test strip, Use as instructed to monitor glucose 4 times daily (use as back up method for CGM), Disp: 100 each, Rfl: 12   insulin aspart (NOVOLOG FLEXPEN) 100 UNIT/ML FlexPen, Inject 10-16 Units into the skin 3 (three) times daily with meals., Disp: ,  Rfl:    insulin degludec (TRESIBA FLEXTOUCH) 200 UNIT/ML FlexTouch Pen, Inject 60 Units into the skin in the morning., Disp: 18 mL, Rfl: 5   Insulin Pen Needle (B-D ULTRAFINE III SHORT PEN) 31G X 8 MM MISC, Use to give insulin daily Dx E11.9, Disp: 100 each, Rfl: 3   Lancets (ONETOUCH ULTRASOFT) lancets, Use as instructed to monitor glucose 4 times daily (use as back up method for CGM)., Disp: 100 each, Rfl:  12   lisinopril (ZESTRIL) 40 MG tablet, Take 1 tablet (40 mg total) by mouth daily., Disp: 90 tablet, Rfl: 3   metFORMIN (GLUCOPHAGE-XR) 500 MG 24 hr tablet, TAKE 2 TABLETS BY MOUTH TWICE A DAY WITH FOOD, Disp: 360 tablet, Rfl: 0   mirtazapine (REMERON) 45 MG tablet, Take 1 tablet (45 mg total) by mouth at bedtime., Disp: 90 tablet, Rfl: 3   omeprazole (PRILOSEC) 20 MG capsule, Take 20 mg by mouth daily., Disp: , Rfl:    rosuvastatin (CRESTOR) 20 MG tablet, TAKE 1 TABLET BY MOUTH EVERYDAY AT BEDTIME, Disp: 90 tablet, Rfl: 3   sildenafil (VIAGRA) 50 MG tablet, Take 1 tablet (50 mg total) by mouth daily as needed for erectile dysfunction., Disp: 10 tablet, Rfl: 2 Social History   Socioeconomic History   Marital status: Divorced    Spouse name: Not on file   Number of children: Not on file   Years of education: Not on file   Highest education level: Not on file  Occupational History   Not on file  Tobacco Use   Smoking status: Former    Packs/day: 0.50    Years: 41.00    Total pack years: 20.50    Types: Cigarettes    Quit date: 02/29/2012    Years since quitting: 9.9   Smokeless tobacco: Never  Vaping Use   Vaping Use: Never used  Substance and Sexual Activity   Alcohol use: No    Comment: quit 2012   Drug use: No    Comment: quit in 2012   Sexual activity: Yes    Birth control/protection: None  Other Topics Concern   Not on file  Social History Narrative   Not on file   Social Determinants of Health   Financial Resource Strain: Low Risk  (09/03/2021)   Overall Financial Resource Strain (CARDIA)    Difficulty of Paying Living Expenses: Not hard at all  Food Insecurity: No Food Insecurity (09/03/2021)   Hunger Vital Sign    Worried About Running Out of Food in the Last Year: Never true    [place] in the Last Year: Never true  Transportation Needs: No Transportation Needs (09/03/2021)   PRAPARE - Hydrologist (Medical): No    Lack of  Transportation (Non-Medical): No  Physical Activity: Insufficiently Active (09/03/2021)   Exercise Vital Sign    Days of Exercise per Week: 3 days    Minutes of Exercise per Session: 30 min  Stress: No Stress Concern Present (09/03/2021)   [employer]    Feeling of Stress : Not at all  Social Connections: Moderately Integrated (09/03/2021)   Social Connection and Isolation Panel [NHANES]    Frequency of Communication with Friends and Family: More than three times a week    Frequency of Social Gatherings with Friends and Family: More than three times a week    Attends Religious Services: More than 4 times per year  Active Member of Clubs or Organizations: Yes    Attends Archivist Meetings: More than 4 times per year    Marital Status: Divorced  Intimate Partner Violence: Not At Risk (09/03/2021)   Humiliation, Afraid, Rape, and Kick questionnaire    Fear of Current or Ex-Partner: No    Emotionally Abused: No    Physically Abused: No    Sexually Abused: No   Family History  Problem Relation Age of Onset   Diabetes Mother    Glaucoma Mother     Objective: Office vital signs reviewed. BP 139/78   Pulse 83   Temp 97.8 F (36.6 Charles)   Ht 6' (1.829 m)   Wt 298 lb 3.2 oz (135.3 kg)   SpO2 93%   BMI 40.44 kg/m   Physical Examination:  General: Awake, alert, well nourished, No acute distress HEENT: Sclera white.  Moist mucous membranes Cardio: regular rate and rhythm, S1S2 heard, no murmurs appreciated Pulm: clear to auscultation bilaterally, no wheezes, rhonchi or rales; normal work of breathing on room air MSK: Ambulating with use of cane Neuro: see Dm foot Diabetic Foot Exam - Simple   Simple Foot Form Diabetic Foot exam was performed with the following findings: Yes 02/12/2022 12:35 PM  Visual Inspection See comments: Yes Sensation Testing See comments: Yes Pulse Check Posterior Tibialis and  Dorsalis pulse intact bilaterally: Yes Comments He has hallucis valgus bilaterally.  Decreased monofilament sensation in the left great toe but monofilament intact throughout otherwise.  Has some mild callus formations noted along the plantar surface of the great toe bilaterally      Assessment/ Plan: 71 y.o. male   Type 2 diabetes mellitus with diabetic neuropathy, with long-term current use of insulin ([place]) - Plan: Bayer DCA Hb A1c Waived, For Home Use Only DME Diabetic Shoe  Foot callus  Hypertension associated with diabetes ([place])  Hyperlipidemia associated with type 2 diabetes mellitus ([place])  Morbid obesity (HCC)  Paroxysmal A-fib (HCC)  GAD (generalized anxiety disorder) - Plan: ALPRAZolam (XANAX) 1 MG tablet  Heartburn - Plan: famotidine (PEPCID) 20 MG tablet  Sugar is under better control than previous checkup with A1c from 8.1 down to 7.7.  His exam today was notable for some decreased monofilament sensation in the left great toe.  He had callus formation bilaterally but no ulcerations.  Diabetic shoes were ordered for the patient and that prescription was given to the patient as he was not quite sure where he wants to bring this yet.  Blood pressure at goal.  No changes  Continue statin.  Weight is slightly improving.  Continue lifestyle modification to promote weight loss  Seem to be both rate and rhythm controlled today.  No bleeding with the Eliquis  Anxiety disorder is stable.  Alprazolam renewed.  Continue mirtazapine daily with alprazolam as needed.  National narcotic database reviewed and there were no red flags.  Up-to-date on UDS and CSC  Orders Placed This Encounter  Procedures   Bayer DCA Hb A1c Waived   No orders of the defined types were placed in this encounter.    Charles Norlander, DO [place] [phone]

## 2022-02-19 ENCOUNTER — Ambulatory Visit (INDEPENDENT_AMBULATORY_CARE_PROVIDER_SITE_OTHER): Payer: Medicare Other | Admitting: Pharmacist

## 2022-02-19 DIAGNOSIS — E119 Type 2 diabetes mellitus without complications: Secondary | ICD-10-CM

## 2022-02-19 DIAGNOSIS — I509 Heart failure, unspecified: Secondary | ICD-10-CM

## 2022-02-19 NOTE — Patient Instructions (Addendum)
Visit Information  Following are the goals we discussed today:  Current Barriers:  Unable to independently afford treatment regimen Unable to maintain control of T2DM  Pharmacist Clinical Goal(s):  Over the next 90 days, patient will verbalize ability to afford treatment regimen maintain control of T2DM as evidenced by [place]  through collaboration with PharmD and provider.    Interventions: 1:1 collaboration with [name], DO regarding development and update of comprehensive plan of care as evidenced by provider attestation and co-signature Inter-disciplinary care team collaboration (see longitudinal plan of care) Comprehensive medication review performed; medication list updated in electronic medical record  Diabetes: UNcontrolled; current treatment: TRESIBA 60 units daily, NOVOLOG  10-12 units with meals, metformin, FARXIGA '10MG'$  DAILY;  A1c 8.1%--> 7.7% Consider GLP1 in place of meal time insulin as able-->ecnouraged patient to discuss with NP at endo appt Tolerating farxiga well; no side effects; new onset heart failure Okay with cards, endocrine and PCP Approved for az&me PAP Follows with endocrine Current glucose readings: fasting glucose: <130, post prandial glucose: 150-180 Uses libre CGM/[facility] via [employer] portal Denies hypoglycemic/hyperglycemic symptoms Discussed meal planning options and Plate method for healthy eating Avoid sugary drinks and desserts Incorporate balanced protein, non starchy veggies, 1 serving of carbohydrate with each meal Increase water intake Increase physical activity as able Current exercise: n/a Assessed patient finances. Re-enrollment approved for novo nordisk patient assistance program for 2023; ships to PCP office; tresiba/novolog patient assistance supply shipped today--patient picked up Education provided on diet and lifestyle modifications   Patient Goals/Self-Care Activities Over the next 90  days, patient will:  - take medications as prescribed check glucose 3 times daily or if symptomatic, document, and provide at future appointments  Follow Up Plan: Telephone follow up appointment with care management team member scheduled for: 3 months   Plan: Telephone follow up appointment with care management team member scheduled for:  3-6 months   Signature Regina Eck, PharmD, BCPS Clinical Pharmacist, [place]  II Phone [phone]   Please call the care guide team at [phone] if you need to cancel or reschedule your appointment.   The patient verbalized understanding of instructions, educational materials, and care plan provided today and DECLINED offer to receive copy of patient instructions, educational materials, and care plan.

## 2022-02-19 NOTE — Progress Notes (Signed)
Chronic Care Management Pharmacy Note  02/19/2022 Name:  [name] MRN:  [id number] DOB:  Feb 04, 1951  Summary:  Diabetes: UNcontrolled; current treatment: TRESIBA 60 units daily, NOVOLOG  10-12 units with meals, metformin, FARXIGA 10MG DAILY;  A1c 8.1%--> 7.7% Consider GLP1 in place of meal time insulin as able-->ecnouraged patient to discuss with NP at endo appt Tolerating farxiga well; no side effects; new onset heart failure Okay with cards, endocrine and PCP Approved for az&me PAP Follows with endocrine Current glucose readings: fasting glucose: <130, post prandial glucose: 150-180 Uses libre CGM/[facility] via [employer] portal Denies hypoglycemic/hyperglycemic symptoms Discussed meal planning options and Plate method for healthy eating Avoid sugary drinks and desserts Incorporate balanced protein, non starchy veggies, 1 serving of carbohydrate with each meal Increase water intake Increase physical activity as able Current exercise: n/a Assessed patient finances. Re-enrollment approved for novo nordisk patient assistance program for 2023; ships to PCP office; tresiba/novolog patient assistance supply shipped today--patient picked up Education provided on diet and lifestyle modifications   Patient Goals/Self-Care Activities Over the next 90 days, patient will:  - take medications as prescribed check glucose 3 times daily or if symptomatic, document, and provide at future appointments  Follow Up Plan: Telephone follow up appointment with care management team member scheduled for: 1 month to add GLP1  Subjective: [name] is an 71 y.o. year old male who is a primary patient of [name], DO.  The CCM team was consulted for assistance with disease management and care coordination needs.    Engaged with patient face to face for follow up visit in response to provider referral for pharmacy case management and/or care coordination services.   Consent  to Services:  The patient was given information about Chronic Care Management services, agreed to services, and gave verbal consent prior to initiation of services.  Please see initial visit note for detailed documentation.   Patient Care Team: [name], DO as PCP - General (Family Medicine) Buford Dresser, MD as PCP - Cardiology (Cardiology) Lavera Guise, [facility] (Pharmacist)  Objective:  Lab Results  Component Value Date   CREATININE 0.93 11/23/2021   CREATININE 1.05 11/06/2021   CREATININE 0.89 11/01/2021    Lab Results  Component Value Date   HGBA1C 7.7 (H) 02/12/2022   Last diabetic Eye exam:  Lab Results  Component Value Date/Time   HMDIABEYEEXA Retinopathy (A) 04/20/2019 12:00 AM    Last diabetic Foot exam: No results found for: "HMDIABFOOTEX"      Component Value Date/Time   CHOL 103 06/11/2021 0958   TRIG 98 06/11/2021 0958   HDL 27 (L) 06/11/2021 0958   CHOLHDL 3.8 06/11/2021 0958   LDLCALC 57 06/11/2021 0958       Latest Ref Rng & Units 10/29/2021    8:10 AM 06/11/2021    9:58 AM 10/30/2020    7:56 AM  Hepatic Function  Total Protein 6.5 - 8.1 g/dL 8.2  7.4  7.7   Albumin 3.5 - 5.0 g/dL 3.7  4.1  4.1   AST 15 - 41 U/L 24  34  26   ALT 0 - 44 U/L 17  32  20   Alk Phosphatase 38 - 126 U/L 87  141  102   Total Bilirubin 0.3 - 1.2 mg/dL 0.8  0.7  0.3     Lab Results  Component Value Date/Time   TSH 0.663 10/29/2021 08:10 AM   TSH 0.280 (L) 07/05/2019 09:19  AM   TSH 0.124 (L) 04/28/2019 08:19 AM       Latest Ref Rng & Units 11/06/2021   12:14 PM 10/30/2021    5:28 AM 10/29/2021    8:10 AM  CBC  WBC 3.4 - 10.8 x10E3/uL 6.2  6.5  6.6   Hemoglobin 13.0 - 17.7 g/dL 14.1  12.4  12.4   Hematocrit 37.5 - 51.0 % 45.7  40.8  41.7   Platelets 150 - 450 x10E3/uL 252  220  207     Lab Results  Component Value Date/Time   VD25OH 49.1 04/06/2019 10:36 AM    Clinical ASCVD: No  The ASCVD Risk score (Arnett DK, et al., 2019) failed to  calculate for the following reasons:   The valid total cholesterol range is 130 to 320 mg/dL    Other: (CHADS2VASc if Afib, PHQ9 if depression, MMRC or CAT for COPD, ACT, DEXA)  Social History   Tobacco Use  Smoking Status Former   Packs/day: 0.50   Years: 41.00   Total pack years: 20.50   Types: Cigarettes   Quit date: 02/29/2012   Years since quitting: 9.9  Smokeless Tobacco Never   BP Readings from Last 3 Encounters:  02/12/22 139/78  11/23/21 128/74  11/14/21 136/72   Pulse Readings from Last 3 Encounters:  02/12/22 83  11/23/21 78  11/14/21 60   Wt Readings from Last 3 Encounters:  02/12/22 298 lb 3.2 oz (135.3 kg)  11/23/21 (!) 302 lb 6.4 oz (137.2 kg)  11/14/21 (!) 302 lb (137 kg)    Assessment: Review of patient past medical history, allergies, medications, health status, including review of consultants reports, laboratory and other test data, was performed as part of comprehensive evaluation and provision of chronic care management services.   SDOH:  (Social Determinants of Health) assessments and interventions performed:    CCM Care Plan  No Known Allergies  Medications Reviewed Today     Reviewed by Everlean Cherry, CMA (Certified Medical Assistant) on 02/12/22 at 30  Med List Status: <None>   Medication Order Taking? Sig Documenting Provider Last Dose Status Informant  ALPRAZolam (XANAX) 1 MG tablet [id number]  Take 0.5-1 tablets (0.5-1 mg total) by mouth 3 (three) times daily as needed for anxiety (take ONLY IF NEEDED). Ronnie Doss M, DO  Active   amLODipine (NORVASC) 5 MG tablet [id number]  Take 1 tablet (5 mg total) by mouth daily. Ronnie Doss M, DO  Active Self  apixaban (ELIQUIS) 5 MG TABS tablet [id number]  Take 1 tablet (5 mg total) by mouth 2 (two) times daily. Buford Dresser, MD  Active   Continuous Blood Gluc Sensor (FREESTYLE LIBRE [place]) [place] [id number]  Scan to check glucose at least 4 times daily. DX: E11.4  [name], DO  Active Self  dapagliflozin propanediol (FARXIGA) 10 MG TABS tablet [id number]  Take 1 tablet (10 mg total) by mouth daily before breakfast. [name], DO  Active   famotidine (PEPCID) 20 MG tablet [id number]  TAKE 1 TABLET BY MOUTH TWICE A DAY Gottschalk, Ashly M, DO  Active   furosemide (LASIX) 20 MG tablet [id number]  Take 1 tablet (20 mg total) by mouth daily. Buford Dresser, MD  Active   glucose blood [facility] VERIO) test strip [id number]  Use as instructed to monitor glucose 4 times daily (use as back up method for CGM) Brita Romp, NP  Active Self  insulin aspart (NOVOLOG FLEXPEN) 100 UNIT/ML FlexPen [id number]  Inject 10-16  Units into the skin 3 (three) times daily with meals. [provider]  Active Self           Med Note Blanca Friend, Royce Macadamia   Tue Jun 05, 2021  2:59 PM) Via novo nordisk patient assistance program  insulin degludec (TRESIBA FLEXTOUCH) 200 UNIT/ML FlexTouch Pen [id number]  Inject 60 Units into the skin in the morning. Brita Romp, NP  Active Self           Med Note Gilmer Mor Jun 05, 2021  2:56 PM) Via novo nordisk patient assistance program  Insulin Pen Needle (B-D ULTRAFINE III SHORT PEN) 31G X 8 MM MISC [id number]  Use to give insulin daily Dx E11.9 Baruch Gouty, FNP  Active Self  Lancets [facility] ULTRASOFT) lancets [id number]  Use as instructed to monitor glucose 4 times daily (use as back up method for CGM). Brita Romp, NP  Active Self  lisinopril (ZESTRIL) 40 MG tablet [id number]  Take 1 tablet (40 mg total) by mouth daily. Ronnie Doss M, DO  Active Self  metFORMIN (GLUCOPHAGE-XR) 500 MG 24 hr tablet [id number]  TAKE 2 TABLETS BY MOUTH TWICE A DAY WITH FOOD Ronnie Doss M, DO  Active   mirtazapine (REMERON) 45 MG tablet [id number]  Take 1 tablet (45 mg total) by mouth at bedtime. Ronnie Doss M, DO  Active Self  omeprazole (PRILOSEC) 20 MG capsule [id number]  Take 20 mg by mouth  daily. [provider]  Active Self  rosuvastatin (CRESTOR) 20 MG tablet [id number]  TAKE 1 TABLET BY MOUTH EVERYDAY AT BEDTIME Ronnie Doss M, DO  Active Self  sildenafil (VIAGRA) 50 MG tablet [id number]  Take 1 tablet (50 mg total) by mouth daily as needed for erectile dysfunction. [name], DO  Active Self  Med List Note Loetta Rough 03/31/13 1303): Lisinopril (strength)             Patient Active Problem List   Diagnosis Date Noted   Acute CHF ([place]) 10/29/2021   Atrial fibrillation ([place]) 10/29/2021   Proliferative diabetic retinopathy of left eye with macular edema associated with type 2 diabetes mellitus ([place]) 11/09/2019   Diabetic macular edema of right eye with proliferative retinopathy associated with type 2 diabetes mellitus ([place]) 11/09/2019   Optic atrophy associated with retinal dystrophies 11/09/2019   Primary open angle glaucoma of both eyes, severe stage 11/09/2019   Low TSH level 07/07/2019   Diabetic retinopathy of both eyes ([place]) 07/07/2019   Constipation due to pain medication 04/06/2019   Morbid obesity ([place]) 04/06/2019   Controlled substance agreement signed 04/06/2019   Corneal edema 12/16/2018   Diabetes mellitus ([place]) 10/12/2018   Primary open angle glaucoma (POAG) of right eye, severe stage 09/15/2017   Coronary artery disease due to lipid rich plaque 10/28/2016   Gastroesophageal reflux disease without esophagitis 01/30/2016   Chronic bilateral low back pain without sciatica 01/03/2015   Hypertension associated with diabetes ([place]) 01/03/2015   Primary osteoarthritis involving multiple joints 01/03/2015   Vitamin D deficiency 01/03/2015   GAD (generalized anxiety disorder) 01/03/2015   Erectile dysfunction associated with type 2 diabetes mellitus ([place]) 12/21/2013    Immunization History  Administered Date(s) Administered   Fluad Quad(high Dose 65+) 04/30/2017, 04/13/2019   Influenza Split 04/19/2014, 05/29/2020    Influenza-Unspecified 05/18/2014, 05/09/2015, 04/22/2018, 05/24/2020, 05/19/2021   Moderna SARS-COV2 Booster Vaccination 12/13/2020   Moderna Sars-Covid-2 Vaccination 09/30/2019, 10/29/2019, 06/24/2020, 12/13/2020   Pneumococcal Conjugate-13 04/23/2018  Pneumococcal Polysaccharide-23 12/28/2015   Zoster Recombinat (Shingrix) 02/12/2022   Zoster, Live 01/04/2016    Conditions to be addressed/monitored: CHF and DMII  Care Plan : PHARMD MEDICATION MANAGEMENT  Updates made by Lavera Guise, RPH since 02/21/2022 12:00 AM     Problem: DISEASE PROGRESSION PREVENTION      Goal: T2DM, CHF   Recent Progress: Not on track  Priority: High  Note:   Current Barriers:  Unable to independently afford treatment regimen Unable to maintain control of T2DM  Pharmacist Clinical Goal(s):  Over the next 90 days, patient will verbalize ability to afford treatment regimen maintain control of T2DM as evidenced by [place]  through collaboration with PharmD and provider.    Interventions: 1:1 collaboration with [name], DO regarding development and update of comprehensive plan of care as evidenced by provider attestation and co-signature Inter-disciplinary care team collaboration (see longitudinal plan of care) Comprehensive medication review performed; medication list updated in electronic medical record  Diabetes: UNcontrolled; current treatment: TRESIBA 60 units daily, NOVOLOG  10-12 units with meals, metformin, FARXIGA 10MG DAILY;  A1c 8.1%--> 7.7% Consider GLP1 in place of meal time insulin as able-->ecnouraged patient to discuss with NP at endo appt Tolerating farxiga well; no side effects; new onset heart failure Okay with cards, endocrine and PCP Approved for az&me PAP Follows with endocrine Current glucose readings: fasting glucose: <130, post prandial glucose: 150-180 Uses libre CGM/[facility] via [employer] portal Denies hypoglycemic/hyperglycemic  symptoms Discussed meal planning options and Plate method for healthy eating Avoid sugary drinks and desserts Incorporate balanced protein, non starchy veggies, 1 serving of carbohydrate with each meal Increase water intake Increase physical activity as able Current exercise: n/a Assessed patient finances. Re-enrollment approved for novo nordisk patient assistance program for 2023; ships to PCP office; tresiba/novolog patient assistance supply shipped today--patient picked up Education provided on diet and lifestyle modifications   Patient Goals/Self-Care Activities Over the next 90 days, patient will:  - take medications as prescribed check glucose 3 times daily or if symptomatic, document, and provide at future appointments  Follow Up Plan: Telephone follow up appointment with care management team member scheduled for: 3 months      Medication Assistance:  tresiba, novolog obtained through novo nordisk medication assistance program.  Enrollment ends 07/04/2022 Farxiga via az&me PAP  Follow Up:  Patient agrees to Care Plan and Follow-up.  Plan: Telephone follow up appointment with care management team member scheduled for:  1 months to add GLP1   Regina Eck, PharmD, BCPS Clinical Pharmacist, [place]  II Phone [phone]

## 2022-02-24 DIAGNOSIS — E1140 Type 2 diabetes mellitus with diabetic neuropathy, unspecified: Secondary | ICD-10-CM | POA: Diagnosis not present

## 2022-02-24 DIAGNOSIS — Z794 Long term (current) use of insulin: Secondary | ICD-10-CM | POA: Diagnosis not present

## 2022-03-04 DIAGNOSIS — I509 Heart failure, unspecified: Secondary | ICD-10-CM

## 2022-03-04 DIAGNOSIS — E119 Type 2 diabetes mellitus without complications: Secondary | ICD-10-CM | POA: Diagnosis not present

## 2022-03-18 ENCOUNTER — Ambulatory Visit: Payer: Medicare Other | Admitting: Nurse Practitioner

## 2022-03-18 ENCOUNTER — Encounter: Payer: Self-pay | Admitting: Nurse Practitioner

## 2022-03-18 VITALS — BP 121/78 | HR 94 | Ht 72.0 in | Wt 294.0 lb

## 2022-03-18 DIAGNOSIS — N182 Chronic kidney disease, stage 2 (mild): Secondary | ICD-10-CM

## 2022-03-18 DIAGNOSIS — I10 Essential (primary) hypertension: Secondary | ICD-10-CM

## 2022-03-18 DIAGNOSIS — Z794 Long term (current) use of insulin: Secondary | ICD-10-CM

## 2022-03-18 DIAGNOSIS — E782 Mixed hyperlipidemia: Secondary | ICD-10-CM

## 2022-03-18 DIAGNOSIS — E1122 Type 2 diabetes mellitus with diabetic chronic kidney disease: Secondary | ICD-10-CM

## 2022-03-18 MED ORDER — TRESIBA FLEXTOUCH 200 UNIT/ML ~~LOC~~ SOPN: 50.0000 [IU] | PEN_INJECTOR | Freq: Every morning | SUBCUTANEOUS | 5 refills | Status: DC

## 2022-03-18 NOTE — Progress Notes (Signed)
Endocrinology Follow Up Note       03/18/2022, 10:58 AM   Subjective:    Patient ID: [name], male    DOB: 1951-01-25.  [name] is being seen in follow up after being seen in consultation for management of currently uncontrolled symptomatic diabetes requested by  [name], DO.   Past Medical History:  Diagnosis Date   Anxiety    Arthritis    Diabetes mellitus without complication (HCC)    GERD (gastroesophageal reflux disease)    Hypercholesteremia    Hypertension     Past Surgical History:  Procedure Laterality Date   boils     removed form back of skull   CATARACT EXTRACTION W/PHACO Right 04/02/2013   Procedure: CATARACT EXTRACTION PHACO AND INTRAOCULAR LENS PLACEMENT ([place]);  Surgeon: Williams Che, MD;  Location: [facility];  Service: Ophthalmology;  Laterality: Right;  CDE 7.00   CATARACT EXTRACTION W/PHACO Left 10/04/2013   Procedure: CATARACT EXTRACTION PHACO AND INTRAOCULAR LENS PLACEMENT ([facility]);  Surgeon: Williams Che, MD;  Location: [facility];  Service: Ophthalmology;  Laterality: Left;  CDE:1.46   COLONOSCOPY N/A 07/03/2015   Procedure: COLONOSCOPY;  Surgeon: Rogene Houston, MD;  Location: [facility];  Service: Endoscopy;  Laterality: N/A;  830   EYE SURGERY Right    "valve replaced and stent placed" per pt to reroute the fluid in his eye   ROTATOR CUFF REPAIR Right     Social History   Socioeconomic History   Marital status: Divorced    Spouse name: Not on file   Number of children: Not on file   Years of education: Not on file   Highest education level: Not on file  Occupational History   Not on file  Tobacco Use   Smoking status: Former    Packs/day: 0.50    Years: 41.00    Total pack years: 20.50    Types: Cigarettes    Quit date: 02/29/2012    Years since quitting: 10.0   Smokeless tobacco: Never  Vaping Use   Vaping Use: Never used  Substance and Sexual  Activity   Alcohol use: No    Comment: quit 2012   Drug use: No    Comment: quit in 2012   Sexual activity: Yes    Birth control/protection: None  Other Topics Concern   Not on file  Social History Narrative   Not on file   Social Determinants of Health   Financial Resource Strain: Low Risk  (09/03/2021)   Overall Financial Resource Strain (CARDIA)    Difficulty of Paying Living Expenses: Not hard at all  Food Insecurity: No Food Insecurity (09/03/2021)   Hunger Vital Sign    Worried About Running Out of Food in the Last Year: Never true    [place] in the Last Year: Never true  Transportation Needs: No Transportation Needs (09/03/2021)   PRAPARE - Hydrologist (Medical): No    Lack of Transportation (Non-Medical): No  Physical Activity: Insufficiently Active (09/03/2021)   Exercise Vital Sign    Days of Exercise per Week: 3 days    Minutes of  Exercise per Session: 30 min  Stress: No Stress Concern Present (09/03/2021)   [place]    Feeling of Stress : Not at all  Social Connections: Moderately Integrated (09/03/2021)   Social Connection and Isolation Panel [NHANES]    Frequency of Communication with Friends and Family: More than three times a week    Frequency of Social Gatherings with Friends and Family: More than three times a week    Attends Religious Services: More than 4 times per year    Active Member of [employer] or Organizations: Yes    Attends Music therapist: More than 4 times per year    Marital Status: Divorced    Family History  Problem Relation Age of Onset   Diabetes Mother    Glaucoma Mother     Outpatient Encounter Medications as of 03/18/2022  Medication Sig   ALPRAZolam (XANAX) 1 MG tablet Take 0.5-1 tablets (0.5-1 mg total) by mouth 3 (three) times daily as needed for anxiety (take ONLY IF NEEDED).   amLODipine (NORVASC) 5 MG tablet Take 1  tablet (5 mg total) by mouth daily.   apixaban (ELIQUIS) 5 MG TABS tablet Take 1 tablet (5 mg total) by mouth 2 (two) times daily.   Continuous Blood Gluc Sensor (FREESTYLE LIBRE 14 DAY SENSOR) MISC Scan to check glucose at least 4 times daily. DX: E11.4   dapagliflozin propanediol (FARXIGA) 10 MG TABS tablet Take 1 tablet (10 mg total) by mouth daily before breakfast.   famotidine (PEPCID) 20 MG tablet Take 1 tablet (20 mg total) by mouth 2 (two) times daily.   furosemide (LASIX) 20 MG tablet Take 1 tablet (20 mg total) by mouth daily.   glucose blood (ONETOUCH VERIO) test strip Use as instructed to monitor glucose 4 times daily (use as back up method for CGM)   insulin degludec (TRESIBA FLEXTOUCH) 200 UNIT/ML FlexTouch Pen Inject 50 Units into the skin in the morning.   Insulin Pen Needle (B-D ULTRAFINE III SHORT PEN) 31G X 8 MM MISC Use to give insulin daily Dx E11.9   Lancets (ONETOUCH ULTRASOFT) lancets Use as instructed to monitor glucose 4 times daily (use as back up method for CGM).   lisinopril (ZESTRIL) 40 MG tablet Take 1 tablet (40 mg total) by mouth daily.   metFORMIN (GLUCOPHAGE-XR) 500 MG 24 hr tablet TAKE 2 TABLETS BY MOUTH TWICE A DAY WITH FOOD   mirtazapine (REMERON) 45 MG tablet Take 1 tablet (45 mg total) by mouth at bedtime.   omeprazole (PRILOSEC) 20 MG capsule Take 20 mg by mouth daily.   rosuvastatin (CRESTOR) 20 MG tablet TAKE 1 TABLET BY MOUTH EVERYDAY AT BEDTIME   sildenafil (VIAGRA) 50 MG tablet Take 1 tablet (50 mg total) by mouth daily as needed for erectile dysfunction.   [DISCONTINUED] insulin aspart (NOVOLOG FLEXPEN) 100 UNIT/ML FlexPen Inject 10-16 Units into the skin 3 (three) times daily with meals.   [DISCONTINUED] insulin degludec (TRESIBA FLEXTOUCH) 200 UNIT/ML FlexTouch Pen Inject 60 Units into the skin in the morning. (Patient taking differently: Inject 50-60 Units into the skin at bedtime.)   No facility-administered encounter medications on file as of  03/18/2022.    ALLERGIES: No Known Allergies  VACCINATION STATUS: Immunization History  Administered Date(s) Administered   Fluad Quad(high Dose 65+) 04/30/2017, 04/13/2019   Influenza Split 04/19/2014, 05/29/2020   Influenza-Unspecified 05/18/2014, 05/09/2015, 04/22/2018, 05/24/2020, 05/19/2021   Moderna SARS-COV2 Booster Vaccination 12/13/2020   Moderna Sars-Covid-2 Vaccination  09/30/2019, 10/29/2019, 06/24/2020, 12/13/2020   Pneumococcal Conjugate-13 04/23/2018   Pneumococcal Polysaccharide-23 12/28/2015   Zoster Recombinat (Shingrix) 02/12/2022   Zoster, Live 01/04/2016    Diabetes He presents for his follow-up diabetic visit. He has type 2 diabetes mellitus. Onset time: diagnosed at approx age of 89. His disease course has been improving. There are no hypoglycemic associated symptoms. Associated symptoms include foot paresthesias and weight loss. Pertinent negatives for diabetes include no blurred vision, no fatigue, no polydipsia, no polyuria, no visual change and no weakness. There are no hypoglycemic complications. Symptoms are improving. Diabetic complications include heart disease, impotence, nephropathy and retinopathy. Risk factors for coronary artery disease include diabetes mellitus, dyslipidemia, family history, hypertension, male sex, obesity, sedentary lifestyle and tobacco exposure. Current diabetic treatment includes intensive insulin program and oral agent (dual therapy). He is compliant with treatment most of the time. His weight is decreasing steadily. He is following a generally healthy diet. When asked about meal planning, he reported none. He has not had a previous visit with a dietitian. He rarely participates in exercise. His home blood glucose trend is decreasing steadily. His overall blood glucose range is 130-140 mg/dl. (He presents today with his CGM showing at goal glycemic profile overall.  He was not due for another A1c today, was drawn at PCP office on 7/11 and  was 7.7%.  He does mention some mild low readings.  He was started on Farxiga for new CHF diagnosis.  Analysis of his CGM shows TIR 84%, TAR 15%, TBR 1% with a GMI of 6.7%.  He notes the pharmacist at his PCP office has been helping him with the cost of his medications.) An ACE inhibitor/angiotensin II receptor blocker is being taken. He does not see a podiatrist.Eye exam is current.  Hyperlipidemia This is a chronic problem. The current episode started more than 1 year ago. The problem is controlled. Recent lipid tests were reviewed and are normal. Exacerbating diseases include chronic renal disease, diabetes and obesity. Factors aggravating his hyperlipidemia include fatty foods. Current antihyperlipidemic treatment includes statins. The current treatment provides moderate improvement of lipids. Compliance problems include adherence to diet and adherence to exercise.  Risk factors for coronary artery disease include diabetes mellitus, dyslipidemia, family history, male sex, hypertension, obesity and a sedentary lifestyle.  Hypertension This is a chronic problem. The current episode started more than 1 year ago. The problem has been resolved since onset. The problem is controlled. Pertinent negatives include no blurred vision. There are no associated agents to hypertension. Risk factors for coronary artery disease include diabetes mellitus, dyslipidemia, family history, male gender, obesity, sedentary lifestyle and smoking/tobacco exposure. Past treatments include ACE inhibitors and calcium channel blockers. The current treatment provides moderate improvement. There are no compliance problems.  Hypertensive end-organ damage includes kidney disease, CAD/MI and retinopathy. Identifiable causes of hypertension include chronic renal disease.    Review of systems  Constitutional: + steadily decreasing body weight,  current Body mass index is 39.87 kg/m., no fatigue, no subjective hyperthermia, no subjective  hypothermia Eyes: + blurry vision (bilateral retinopathy with recent surgery on both eyes), legally blind in right eye, no xerophthalmia ENT: no sore throat, no nodules palpated in throat, no dysphagia/odynophagia, no hoarseness Cardiovascular: no chest pain, no shortness of breath, no palpitations, no leg swelling Respiratory: no cough, no shortness of breath Gastrointestinal: no nausea/vomiting/diarrhea Musculoskeletal: no muscle/joint aches, walks with cane for deconditioning Skin: no rashes, no hyperemia Neurological: no tremors, no numbness, no tingling, no dizziness Psychiatric: no  depression, no anxiety   Objective:     BP 121/78   Pulse 94   Ht 6' (1.829 m)   Wt 294 lb (133.4 kg)   BMI 39.87 kg/m   Wt Readings from Last 3 Encounters:  03/18/22 294 lb (133.4 kg)  02/12/22 298 lb 3.2 oz (135.3 kg)  11/23/21 (!) 302 lb 6.4 oz (137.2 kg)     BP Readings from Last 3 Encounters:  03/18/22 121/78  02/12/22 139/78  11/23/21 128/74     Physical Exam- Limited  Constitutional:  Body mass index is 39.87 kg/m. , not in acute distress, normal state of mind Eyes:  EOMI, no exophthalmos, right eye opaque conjunctiva Neck: Supple Cardiovascular: RRR, no murmurs, rubs, or gallops, no edema Respiratory: Adequate breathing efforts, no crackles, rales, rhonchi, or wheezing Musculoskeletal: no gross deformities, strength intact in all four extremities, no gross restriction of joint movements, walks with cane for deconditioning Skin:  no rashes, no hyperemia Neurological: no tremor with outstretched hands    CMP ( most recent) CMP     Component Value Date/Time   NA 145 (H) 11/23/2021 0933   K 5.0 11/23/2021 0933   CL 106 11/23/2021 0933   CO2 18 (L) 11/23/2021 0933   GLUCOSE 139 (H) 11/23/2021 0933   GLUCOSE 164 (H) 11/01/2021 0536   BUN 21 11/23/2021 0933   CREATININE 0.93 11/23/2021 0933   CALCIUM 9.8 11/23/2021 0933   PROT 8.2 (H) 10/29/2021 0810   PROT 7.4 06/11/2021  0958   ALBUMIN 3.7 10/29/2021 0810   ALBUMIN 4.1 06/11/2021 0958   AST 24 10/29/2021 0810   ALT 17 10/29/2021 0810   ALKPHOS 87 10/29/2021 0810   BILITOT 0.8 10/29/2021 0810   BILITOT 0.7 06/11/2021 0958   GFRNONAA >60 11/01/2021 0536   GFRAA 92 04/13/2020 0906     Diabetic Labs (most recent): Lab Results  Component Value Date   HGBA1C 7.7 (H) 02/12/2022   HGBA1C 8.1 (H) 10/29/2021   HGBA1C 8.0 (H) 06/11/2021   MICROALBUR 150 12/05/2020     Lipid Panel ( most recent) Lipid Panel     Component Value Date/Time   CHOL 103 06/11/2021 0958   TRIG 98 06/11/2021 0958   HDL 27 (L) 06/11/2021 0958   CHOLHDL 3.8 06/11/2021 0958   LDLCALC 57 06/11/2021 0958   LABVLDL 19 06/11/2021 0958      Lab Results  Component Value Date   TSH 0.663 10/29/2021   TSH 0.280 (L) 07/05/2019   TSH 0.124 (L) 04/28/2019   TSH 0.059 (L) 04/06/2019           Assessment & Plan:   1) Uncontrolled Type 2 Diabetes with long-term use of insulin  - [name] has currently uncontrolled symptomatic type 2 DM since 71 years of age.   He presents today with his CGM showing at goal glycemic profile overall.  He was not due for another A1c today, was drawn at PCP office on 7/11 and was 7.7%.  He does mention some mild low readings.  He was started on Farxiga for new CHF diagnosis.  Analysis of his CGM shows TIR 84%, TAR 15%, TBR 1% with a GMI of 6.7%.  He notes the pharmacist at his PCP office has been helping him with the cost of his medications.  -Recent labs reviewed.  - I had a long discussion with him about the progressive nature of diabetes and the pathology behind its complications. -his diabetes is complicated by CKD, CAD  and he remains at a high risk for more acute and chronic complications which include CAD, CVA, CKD, retinopathy, and neuropathy. These are all discussed in detail with him.  - Nutritional counseling repeated at each appointment due to patients tendency to fall back in to  old habits.  - The patient admits there is a room for improvement in their diet and drink choices. -  Suggestion is made for the patient to avoid simple carbohydrates from their diet including Cakes, Sweet Desserts / Pastries, Ice Cream, Soda (diet and regular), Sweet Tea, Candies, Chips, Cookies, Sweet Pastries, Store Bought Juices, Alcohol in Excess of 1-2 drinks a day, Artificial Sweeteners, Coffee Creamer, and "Sugar-free" Products. This will help patient to have stable blood glucose profile and potentially avoid unintended weight gain.   - I encouraged the patient to switch to unprocessed or minimally processed complex starch and increased protein intake (animal or plant source), fruits, and vegetables.   - Patient is advised to stick to a routine mealtimes to eat 3 meals a day and avoid unnecessary snacks (to snack only to correct hypoglycemia).  - I have approached him with the following individualized plan to manage  his diabetes and patient agrees:   -He is advised to continue his Tresiba 50 units SQ nightly and stop his Novolog altogether today (he had only been taking it maybe once per day due to good readings).  I discussed and initiated Ozempic 0.25 mg SQ weekly x 2 weeks then increase to 0.5 mg SQ weekly thereafter if tolerated well.  He can continue his Metformin 500 mg ER twice daily with meals and Farxiga 10 mg po daily.   -he is encouraged to continue using his CGM to monitor glucose 4 times daily, before meals and before bed, and to call the clinic if he has readings less than 70 or greater than 300 for 3 tests in a row.  - he is warned not to take insulin without proper monitoring per orders. - Adjustment parameters are given to him for hypo and hyperglycemia in writing.  - Specific targets for  A1c;  LDL, HDL,  and Triglycerides were discussed with the patient.  2) Blood Pressure /Hypertension:  his blood pressure is controlled to target for his age.   he is advised to  continue his current medications including Norvasc 5 mg po daily and Lisinopril 40 mg p.o. daily with breakfast.  3) Lipids/Hyperlipidemia:    Review of his recent lipid panel from 06/11/21 showed controlled LDL at 57 .  he  is advised to continue Crestor 20 mg daily at bedtime.  Side effects and precautions discussed with him.    4)  Weight/Diet:  his Body mass index is 39.87 kg/m.  -  clearly complicating his diabetes care.   he is a candidate for weight loss. I discussed with him the fact that loss of 5 - 10% of his  current body weight will have the most impact on his diabetes management.  Exercise, and detailed carbohydrates information provided  -  detailed on discharge instructions.  5) Chronic Care/Health Maintenance: -he is on ACEI/ARB and Statin medications and is encouraged to initiate and continue to follow up with Ophthalmology, Dentist, Podiatrist at least yearly or according to recommendations, and advised to stay away from smoking. I have recommended yearly flu vaccine and pneumonia vaccine at least every 5 years; moderate intensity exercise for up to 150 minutes weekly; and sleep for at least 7 hours a day.  -  he is advised to maintain close follow up with [name], DO for primary care needs, as well as his other providers for optimal and coordinated care.     I spent 44 minutes in the care of the patient today including review of labs from [place], Lipids, Thyroid Function, Hematology (current and previous including abstractions from other facilities); face-to-face time discussing  his blood glucose readings/logs, discussing hypoglycemia and hyperglycemia episodes and symptoms, medications doses, his options of short and long term treatment based on the latest standards of care / guidelines;  discussion about incorporating lifestyle medicine;  and documenting the encounter. Risk reduction counseling performed per USPSTF guidelines to reduce obesity and cardiovascular risk  factors.     Please refer to Patient Instructions for Blood Glucose Monitoring and Insulin/Medications Dosing Guide"  in media tab for additional information. Please  also refer to " Patient Self Inventory" in the Media  tab for reviewed elements of pertinent patient history.  [name] participated in the discussions, expressed understanding, and voiced agreement with the above plans.  All questions were answered to his satisfaction. he is encouraged to contact clinic should he have any questions or concerns prior to his return visit.   Follow up plan: - Return in about 3 months (around 06/18/2022) for Diabetes F/U with A1c in office, No previsit labs, Bring meter and logs.  Rayetta Pigg, [facility] [address] [place], [place] [address] Phone: [phone] Fax: [phone]  03/18/2022, 10:58 AM

## 2022-03-20 ENCOUNTER — Ambulatory Visit (INDEPENDENT_AMBULATORY_CARE_PROVIDER_SITE_OTHER): Payer: Medicare Other | Admitting: Pharmacist

## 2022-03-20 DIAGNOSIS — E1159 Type 2 diabetes mellitus with other circulatory complications: Secondary | ICD-10-CM

## 2022-03-20 DIAGNOSIS — I4819 Other persistent atrial fibrillation: Secondary | ICD-10-CM

## 2022-03-22 NOTE — Progress Notes (Signed)
Chronic Care Management Pharmacy Note  03/20/2022 Name:  [name] MRN:  [id number] DOB:  [birth year]  Summary:  Diabetes: Uncontrolled (improved); current treatment: TRESIBA 60 units daily, NOVOLOG  10-12 units with meals (not using as frequent--will decrease since adding GLP1), metformin, FARXIGA 10MG DAILY; new start Ozempic A1c 8.1%--> 7.7% START ozempic 0.79m weekly  Consider GLP1 in place of meal time insulin as able-->discussed with endo Denies personal and family history of Medullary thyroid cancer (MTC) Submitted novo nordisk application CONTINUE Farxiga Tolerating farxiga well; no side effects; new onset heart failure Okay with cards, endocrine and PCP Approved for az&me PAP--ships to home Continue Tresiba Continue novolog, but monitor--decrease to 5-10 units per meal Follows with endocrine Current glucose readings: fasting glucose: <130, post prandial glucose: 150-180 Uses libre CGM/[facility] via [employer] portal Denies hypoglycemic/hyperglycemic symptoms Discussed meal planning options and Plate method for healthy eating Avoid sugary drinks and desserts Incorporate balanced protein, non starchy veggies, 1 serving of carbohydrate with each meal Increase water intake Increase physical activity as able Current exercise: n/a Assessed patient finances. Re-enrollment approved for novo nordisk patient assistance program for 2023; ships to PCP office; [place] [facility] [place] [facility] on behalf of az&me PAP Education provided on diet and lifestyle modifications   Patient Goals/Self-Care Activities Over the next 90 days, patient will:  - take medications as prescribed check glucose 3 times daily or if symptomatic, document, and provide at future appointments  Follow Up Plan: Telephone follow up appointment with care management team member scheduled for: 3 months  Subjective: Charles RHINEis an 71y.o. year old male who is a primary patient of  [name] DO.  The CCM team was consulted for assistance with disease management and care coordination needs.    Engaged with patient by telephone for follow up visit in response to provider referral for pharmacy case management and/or care coordination services.   Consent to Services:  The patient was given information about Chronic Care Management services, agreed to services, and gave verbal consent prior to initiation of services.  Please see initial visit note for detailed documentation.   Patient Care Team: [name] DO as PCP - General (Family Medicine) CBuford Dresser MD as PCP - Cardiology (Cardiology) [facility]vera [facility](Ph[facility]ist)  Objective:  Lab Results  Component Value Date   CREATININE 0.93 11/23/2021   CREATININE 1.05 11/06/2021   CREATININE 0.89 11/01/2021    Lab Results  Component Value Date   HGBA1C 7.7 (H) 02/12/2022   Last diabetic Eye exam:  Lab Results  Component Value Date/Time   HMDIABEYEEXA Retinopathy (A) 04/20/2019 12:00 AM    Last diabetic Foot exam: No results found for: "HMDIABFOOTEX"      Component Value Date/Time   CHOL 103 06/11/2021 0958   TRIG 98 06/11/2021 0958   HDL 27 (L) 06/11/2021 0958   CHOLHDL 3.8 06/11/2021 0958   LDLCALC 57 06/11/2021 0958       Latest Ref Rng & Units 10/29/2021    8:10 AM 06/11/2021    9:58 AM 10/30/2020    7:56 AM  Hepatic Function  Total Protein 6.5 - 8.1 g/dL 8.2  7.4  7.7   Albumin 3.5 - 5.0 g/dL 3.7  4.1  4.1   AST 15 - 41 U/L 24  34  26   ALT 0 - 44 U/L 17  32  20   Alk Phosphatase 38 - 126 U/L 87  141  102   Total Bilirubin 0.3 - 1.2 mg/dL 0.8  0.7  0.3     Lab Results  Component Value Date/Time   TSH 0.663 10/29/2021 08:10 AM   TSH 0.280 (L) 07/05/2019 09:19 AM   TSH 0.124 (L) 04/28/2019 08:19 AM       Latest Ref Rng & Units 11/06/2021   12:14 PM 10/30/2021    5:28 AM 10/29/2021    8:10 AM  CBC  WBC 3.4 - 10.8 x10E3/uL 6.2  6.5  6.6   Hemoglobin 13.0 -  17.7 g/dL 14.1  12.4  12.4   Hematocrit 37.5 - 51.0 % 45.7  40.8  41.7   Platelets 150 - 450 x10E3/uL 252  220  207     Lab Results  Component Value Date/Time   VD25OH 49.1 04/06/2019 10:36 AM    Clinical ASCVD: No  The ASCVD Risk score (Arnett DK, et al., 2019) failed to calculate for the following reasons:   The valid total cholesterol range is 130 to 320 mg/dL    Other: (CHADS2VASc if Afib, PHQ9 if depression, MMRC or CAT for COPD, ACT, DEXA)  Social History   Tobacco Use  Smoking Status Former   Packs/day: 0.50   Years: 41.00   Total pack years: 20.50   Types: Cigarettes   Quit date: 02/29/2012   Years since quitting: 10.0  Smokeless Tobacco Never   BP Readings from Last 3 Encounters:  03/18/22 121/78  02/12/22 139/78  11/23/21 128/74   Pulse Readings from Last 3 Encounters:  03/18/22 94  02/12/22 83  11/23/21 78   Wt Readings from Last 3 Encounters:  03/18/22 294 lb (133.4 kg)  02/12/22 298 lb 3.2 oz (135.3 kg)  11/23/21 (!) 302 lb 6.4 oz (137.2 kg)    Assessment: Review of patient past medical history, allergies, medications, health status, including review of consultants reports, laboratory and other test data, was performed as part of comprehensive evaluation and provision of chronic care management services.   SDOH:  (Social Determinants of Health) assessments and interventions performed:    CCM Care Plan  No Known Allergies  Medications Reviewed Today     Reviewed by Lavera Guise, [facility] (Pharmacist) on 03/22/22 at 1153  Med List Status: <None>   Medication Order Taking? Sig Documenting Provider Last Dose Status Informant  ALPRAZolam (XANAX) 1 MG tablet [id number]  Take 0.5-1 tablets (0.5-1 mg total) by mouth 3 (three) times daily as needed for anxiety (take ONLY IF NEEDED). Ronnie Doss M, DO  Active   amLODipine (NORVASC) 5 MG tablet [id number]  Take 1 tablet (5 mg total) by mouth daily. Ronnie Doss M, DO  Active   apixaban (ELIQUIS) 5  MG TABS tablet [id number]  Take 1 tablet (5 mg total) by mouth 2 (two) times daily. Buford Dresser, MD  Active   Continuous Blood Gluc Sensor (FREESTYLE LIBRE [place]) [place] [id number]  Scan to check glucose at least 4 times daily. DX: E11.4 [name], DO  Active Self  dapagliflozin propanediol (FARXIGA) 10 MG TABS tablet [id number]  Take 1 tablet (10 mg total) by mouth daily before breakfast. [name], DO  Active            Med Note Blanca Friend, Royce Macadamia   Fri Mar 22, 2022  9:53 AM) Via AZ&me patient assistance program   famotidine (PEPCID) 20 MG tablet [id number]  Take 1 tablet (20 mg total) by mouth 2 (two) times daily. Ronnie Doss M, DO  Active   furosemide (LASIX) 20 MG tablet [id number]  Take 1 tablet (20 mg total) by mouth daily. Buford Dresser, MD  Active   glucose blood [facility] VERIO) test strip [id number]  Use as instructed to monitor glucose 4 times daily (use as back up method for CGM) Brita Romp, NP  Active Self  insulin degludec (TRESIBA FLEXTOUCH) 200 UNIT/ML FlexTouch Pen [id number]  Inject 50 Units into the skin in the morning. Brita Romp, NP  Active   Insulin Pen Needle (B-D ULTRAFINE III SHORT PEN) 31G X 8 MM MISC [id number]  Use to give insulin daily Dx E11.9 Baruch Gouty, FNP  Active Self  Lancets [facility] ULTRASOFT) lancets [id number]  Use as instructed to monitor glucose 4 times daily (use as back up method for CGM). Brita Romp, NP  Active Self  lisinopril (ZESTRIL) 40 MG tablet [id number]  Take 1 tablet (40 mg total) by mouth daily. Ronnie Doss M, DO  Active   metFORMIN (GLUCOPHAGE-XR) 500 MG 24 hr tablet [id number]  TAKE 2 TABLETS BY MOUTH TWICE A DAY WITH FOOD Ronnie Doss M, DO  Active   mirtazapine (REMERON) 45 MG tablet [id number]  Take 1 tablet (45 mg total) by mouth at bedtime. Ronnie Doss M, DO  Active   omeprazole (PRILOSEC) 20 MG capsule [id number]  Take 20 mg by mouth daily. [provider]  Active Self  rosuvastatin (CRESTOR) 20 MG tablet [id number]  TAKE 1 TABLET BY MOUTH EVERYDAY AT BEDTIME Ronnie Doss M, DO  Active   Semaglutide,0.25 or 0.5MG/DOS, (OZEMPIC, 0.25 OR 0.5 MG/DOSE,) 2 MG/3ML SOPN [id number] Yes Inject 0.5 mg into the skin once a week. [provider]  Active            Med Note Blanca Friend, Royce Macadamia   Fri Mar 22, 2022  9:53 AM) Via novo nordisk patient assistance program    sildenafil (VIAGRA) 50 MG tablet [id number]  Take 1 tablet (50 mg total) by mouth daily as needed for erectile dysfunction. [name], DO  Active Self  Med List Note Loetta Rough 03/31/13 1303): Lisinopril (strength)             Patient Active Problem List   Diagnosis Date Noted   Acute CHF ([place]) 10/29/2021   Atrial fibrillation ([place]) 10/29/2021   Proliferative diabetic retinopathy of left eye with macular edema associated with type 2 diabetes mellitus ([place]) 11/09/2019   Diabetic macular edema of right eye with proliferative retinopathy associated with type 2 diabetes mellitus ([place]) 11/09/2019   Optic atrophy associated with retinal dystrophies 11/09/2019   Primary open angle glaucoma of both eyes, severe stage 11/09/2019   Low TSH level 07/07/2019   Diabetic retinopathy of both eyes ([place]) 07/07/2019   Constipation due to pain medication 04/06/2019   Morbid obesity ([place]) 04/06/2019   Controlled substance agreement signed 04/06/2019   Corneal edema 12/16/2018   Diabetes mellitus ([place]) 10/12/2018   Primary open angle glaucoma (POAG) of right eye, severe stage 09/15/2017   Coronary artery disease due to lipid rich plaque 10/28/2016   Gastroesophageal reflux disease without esophagitis 01/30/2016   Chronic bilateral low back pain without sciatica 01/03/2015   Hypertension associated with diabetes ([place]) 01/03/2015   Primary osteoarthritis involving multiple joints 01/03/2015   Vitamin D deficiency 01/03/2015   GAD (generalized anxiety  disorder) 01/03/2015   Erectile dysfunction associated with type 2 diabetes mellitus ([place]) 12/21/2013    Immunization History  Administered Date(s) Administered  Fluad Quad(high Dose 65+) 04/30/2017, 04/13/2019   Influenza Split 04/19/2014, 05/29/2020   Influenza-Unspecified 05/18/2014, 05/09/2015, 04/22/2018, 05/24/2020, 05/19/2021   Moderna SARS-COV2 Booster Vaccination 12/13/2020   Moderna Sars-Covid-2 Vaccination 09/30/2019, 10/29/2019, 06/24/2020, 12/13/2020   Pneumococcal Conjugate-13 04/23/2018   Pneumococcal Polysaccharide-23 12/28/2015   Zoster Recombinat (Shingrix) 02/12/2022   Zoster, Live 01/04/2016    Conditions to be addressed/monitored: DMII and CKD Stage 3a  Care Plan : PHARMD MEDICATION MANAGEMENT  Updates made by Lavera Guise, [place] since 03/22/2022 12:00 AM     Problem: DISEASE PROGRESSION PREVENTION      Goal: T2DM, CHF   This Visit's Progress: On track  Recent Progress: Not on track  Priority: High  Note:   Current Barriers:  Unable to independently afford treatment regimen Unable to maintain control of T2DM  Pharmacist Clinical Goal(s):  Over the next 90 days, patient will verbalize ability to afford treatment regimen maintain control of T2DM as evidenced by [place]  through collaboration with PharmD and provider.    Interventions: 1:1 collaboration with [name], DO regarding development and update of comprehensive plan of care as evidenced by provider attestation and co-signature Inter-disciplinary care team collaboration (see longitudinal plan of care) Comprehensive medication review performed; medication list updated in electronic medical record  Diabetes: Uncontrolled (improved); current treatment: TRESIBA 60 units daily, NOVOLOG  10-12 units with meals (not using as frequent--will decrease since adding GLP1), metformin, FARXIGA 10MG DAILY; new start Ozempic A1c 8.1%--> 7.7% START ozempic 0.68m weekly   Consider GLP1 in place of meal time insulin as able-->discussed with endo Denies personal and family history of Medullary thyroid cancer (MTC) Submitted novo nordisk application CONTINUE Farxiga Tolerating farxiga well; no side effects; new onset heart failure Okay with cards, endocrine and PCP Approved for az&me PAP--ships to home Continue Tresiba Continue novolog, but monitor--decrease to 5-10 units per meal Follows with endocrine Current glucose readings: fasting glucose: <130, post prandial glucose: 150-180 Uses libre CGM/[facility] via [facility] portal Denies hypoglycemic/hyperglycemic symptoms Discussed meal planning options and Plate method for healthy eating Avoid sugary drinks and desserts Incorporate balanced protein, non starchy veggies, 1 serving of carbohydrate with each meal Increase water intake Increase physical activity as able Current exercise: n/a Assessed patient finances. Re-enrollment approved for novo nordisk patient assistance program for 2023; ships to PCP office; [place] [facility] on behalf of az&me PAP Education provided on diet and lifestyle modifications   Patient Goals/Self-Care Activities Over the next 90 days, patient will:  - take medications as prescribed check glucose 3 times daily or if symptomatic, document, and provide at future appointments  Follow Up Plan: Telephone follow up appointment with care management team member scheduled for: 3 months      Plan: Telephone follow up appointment with care management team member scheduled for:  3 months   JRegina Eck PharmD, BCPS Clinical Pharmacist, [place] Phone [phone]

## 2022-03-22 NOTE — Patient Instructions (Signed)
Visit Information  Following are the goals we discussed today:  Current Barriers:  Unable to independently afford treatment regimen Unable to maintain control of T2DM  Pharmacist Clinical Goal(s):  Over the next 90 days, patient will verbalize ability to afford treatment regimen maintain control of T2DM as evidenced by [place]  through collaboration with PharmD and provider.    Interventions: 1:1 collaboration with [name], DO regarding development and update of comprehensive plan of care as evidenced by provider attestation and co-signature Inter-disciplinary care team collaboration (see longitudinal plan of care) Comprehensive medication review performed; medication list updated in electronic medical record  Diabetes: Uncontrolled (improved); current treatment: TRESIBA 60 units daily, NOVOLOG  10-12 units with meals (not using as frequent--will decrease since adding GLP1), metformin, FARXIGA '10MG'$  DAILY; new start Ozempic A1c 8.1%--> 7.7% START ozempic 0.'25mg'$  weekly  Consider GLP1 in place of meal time insulin as able-->discussed with endo Denies personal and family history of Medullary thyroid cancer (MTC) Submitted novo nordisk application CONTINUE Farxiga Tolerating farxiga well; no side effects; new onset heart failure Okay with cards, endocrine and PCP Approved for az&me PAP--ships to home Continue Tresiba Continue novolog, but monitor--decrease to 5-10 units per meal Follows with endocrine Current glucose readings: fasting glucose: <130, post prandial glucose: 150-180 Uses libre CGM/[facility] via [facility] portal Denies hypoglycemic/hyperglycemic symptoms Discussed meal planning options and Plate method for healthy eating Avoid sugary drinks and desserts Incorporate balanced protein, non starchy veggies, 1 serving of carbohydrate with each meal Increase water intake Increase physical activity as able Current exercise: n/a Assessed  patient finances. Re-enrollment approved for novo nordisk patient assistance program for 2023; ships to PCP office; [place] to home via [facility] on behalf of az&me PAP Education provided on diet and lifestyle modifications   Patient Goals/Self-Care Activities Over the next 90 days, patient will:  - take medications as prescribed check glucose 3 times daily or if symptomatic, document, and provide at future appointments  Follow Up Plan: Telephone follow up appointment with care management team member scheduled for: 3 months   Plan: Telephone follow up appointment with care management team member scheduled for:  1 month  Signature Regina Eck, PharmD, BCPS Clinical Pharmacist, [place]  II Phone [phone]   Please call the care guide team at [phone] if you need to cancel or reschedule your appointment.   The patient verbalized understanding of instructions, educational materials, and care plan provided today and DECLINED offer to receive copy of patient instructions, educational materials, and care plan.

## 2022-03-28 ENCOUNTER — Telehealth: Payer: Self-pay | Admitting: *Deleted

## 2022-03-28 NOTE — Telephone Encounter (Signed)
[name] is here for pt pick up #4 boxes - pt aware to come and get.   He also received #5 novolog boxes - he states that he is no longer on this one. I will place in fridge with [place] for Almyra Free.   Almyra Free,  He also states that he will need another Ozempic soon

## 2022-04-01 ENCOUNTER — Telehealth: Payer: Self-pay | Admitting: Family Medicine

## 2022-04-01 NOTE — Telephone Encounter (Signed)
  Prescription Request  04/01/2022  Is this a "Controlled Substance" medicine? no  Have you seen your PCP in the last 2 weeks? [place]  If YES, route message to pool  -  If NO, patient needs to be scheduled for appointment.  What is the name of the medication or equipment? [facility] wants pt to start ozempic . He said Almyra Free talked to him about getting it for him  Have you contacted your pharmacy to request a refill? na   Which pharmacy would you like this sent to? na   Patient notified that their request is being sent to the clinical staff for review and that they should receive a response within 2 business days.

## 2022-04-04 DIAGNOSIS — I4819 Other persistent atrial fibrillation: Secondary | ICD-10-CM

## 2022-04-04 DIAGNOSIS — I152 Hypertension secondary to endocrine disorders: Secondary | ICD-10-CM

## 2022-04-04 DIAGNOSIS — E1159 Type 2 diabetes mellitus with other circulatory complications: Secondary | ICD-10-CM

## 2022-04-15 DIAGNOSIS — E1140 Type 2 diabetes mellitus with diabetic neuropathy, unspecified: Secondary | ICD-10-CM | POA: Diagnosis not present

## 2022-04-15 DIAGNOSIS — Z794 Long term (current) use of insulin: Secondary | ICD-10-CM | POA: Diagnosis not present

## 2022-04-23 DIAGNOSIS — L84 Corns and callosities: Secondary | ICD-10-CM | POA: Diagnosis not present

## 2022-04-23 DIAGNOSIS — E1142 Type 2 diabetes mellitus with diabetic polyneuropathy: Secondary | ICD-10-CM | POA: Diagnosis not present

## 2022-04-23 DIAGNOSIS — M79676 Pain in unspecified toe(s): Secondary | ICD-10-CM | POA: Diagnosis not present

## 2022-04-23 DIAGNOSIS — B351 Tinea unguium: Secondary | ICD-10-CM | POA: Diagnosis not present

## 2022-04-25 ENCOUNTER — Ambulatory Visit (INDEPENDENT_AMBULATORY_CARE_PROVIDER_SITE_OTHER): Payer: Medicare Other | Admitting: Pharmacist

## 2022-04-25 ENCOUNTER — Telehealth: Payer: Self-pay

## 2022-04-25 DIAGNOSIS — E1140 Type 2 diabetes mellitus with diabetic neuropathy, unspecified: Secondary | ICD-10-CM

## 2022-04-25 DIAGNOSIS — E1169 Type 2 diabetes mellitus with other specified complication: Secondary | ICD-10-CM

## 2022-04-25 NOTE — Telephone Encounter (Signed)
Pt needs letter dismissing him from jury duty not being able to sit for that long- ok to write?

## 2022-04-30 ENCOUNTER — Telehealth: Payer: Self-pay

## 2022-04-30 NOTE — Telephone Encounter (Signed)
Received 5 boxes of Ozempic from patient assistance - called and notified patient ready to p/u

## 2022-04-30 NOTE — Telephone Encounter (Signed)
LETTER WRITTEN AND PT AWARE

## 2022-04-30 NOTE — Telephone Encounter (Signed)
Can we cancel novolog? Continue all other PAP meds

## 2022-04-30 NOTE — Progress Notes (Signed)
Chronic Care Management Pharmacy Note  04/25/2022 Name:  [name] MRN:  [id number] DOB:  01/23/1951  Summary:  Diabetes: Uncontrolled (improved); current treatment: TRESIBA 50-60 units daily, metformin, FARXIGA 10MG DAILY; Ozempic 0.64m weekly A1c 8.1%--> 7.7% CONTINUE Ozempic 0.26mweekly  Denies personal and family history of Medullary thyroid cancer (MTC) Submitted novo nordisk application Sample given today to bridge to patient supply CONTINUE Farxiga Tolerating farxiga well; no side effects; new onset heart failure Okay with cards, endocrine and PCP Approved for az&me PAP--ships to home Continue Tresiba Patient no longer on Novolog meal time insulin with addition of Ozempic Follows with endocrine Current glucose readings: fasting glucose: <130, post prandial glucose: 150-180 Uses libre CGM/[facility] via [facility] portal Denies hypoglycemic/hyperglycemic symptoms Discussed meal planning options and Plate method for healthy eating Avoid sugary drinks and desserts Incorporate balanced protein, non starchy veggies, 1 serving of carbohydrate with each meal Increase water intake Increase physical activity as able Current exercise: n/a Assessed patient finances. Re-enrollment approved for novo nordisk patient assistance program for 2023; ships to PCP office; [place] [facility] on behalf of az&me PAP Education provided on diet and lifestyle modifications   Patient Goals/Self-Care Activities Over the next 90 days, patient will:  - take medications as prescribed check glucose 3 times daily or if symptomatic, document, and provide at future appointments  Subjective: Charles GEISENs an [age].o. year old male who is a primary patient of GoJanora NorlanderDO.  The CCM team was consulted for assistance with disease management and care coordination needs.    Engaged with patient face to face for follow up visit in response to provider  referral for pharmacy case management and/or care coordination services.   Consent to Services:  The patient was given information about Chronic Care Management services, agreed to services, and gave verbal consent prior to initiation of services.  Please see initial visit note for detailed documentation.   Patient Care Team: GoJanora NorlanderDO as PCP - General (Family Medicine) ChBuford DresserMD as PCP - Cardiology (Cardiology) PrLavera GuiseRPJames E Van Zandt Va Medical CenterPharmacist)  Objective:  Lab Results  Component Value Date   CREATININE 0.93 11/23/2021   CREATININE 1.05 11/06/2021   CREATININE 0.89 11/01/2021    Lab Results  Component Value Date   HGBA1C 7.7 (H) 02/12/2022   Last diabetic Eye exam:  Lab Results  Component Value Date/Time   HMDIABEYEEXA Retinopathy (A) 04/20/2019 12:00 AM    Last diabetic Foot exam: No results found for: "HMDIABFOOTEX"      Component Value Date/Time   CHOL 103 06/11/2021 0958   TRIG 98 06/11/2021 0958   HDL 27 (L) 06/11/2021 0958   CHOLHDL 3.8 06/11/2021 0958   LDLCALC 57 06/11/2021 0958       Latest Ref Rng & Units 10/29/2021    8:10 AM 06/11/2021    9:58 AM 10/30/2020    7:56 AM  Hepatic Function  Total Protein 6.5 - 8.1 g/dL 8.2  7.4  7.7   Albumin 3.5 - 5.0 g/dL 3.7  4.1  4.1   AST 15 - 41 U/L 24  34  26   ALT 0 - 44 U/L 17  32  20   Alk Phosphatase 38 - 126 U/L 87  141  102   Total Bilirubin 0.3 - 1.2 mg/dL 0.8  0.7  0.3     Lab Results  Component Value Date/Time   TSH 0.663 10/29/2021 08:10 AM  TSH 0.280 (L) 07/05/2019 09:19 AM   TSH 0.124 (L) 04/28/2019 08:19 AM       Latest Ref Rng & Units 11/06/2021   12:14 PM 10/30/2021    5:28 AM 10/29/2021    8:10 AM  CBC  WBC 3.4 - 10.8 x10E3/uL 6.2  6.5  6.6   Hemoglobin 13.0 - 17.7 g/dL 14.1  12.4  12.4   Hematocrit 37.5 - 51.0 % 45.7  40.8  41.7   Platelets 150 - 450 x10E3/uL 252  220  207     Lab Results  Component Value Date/Time   VD25OH 49.1 04/06/2019 10:36 AM     Clinical ASCVD: No  The ASCVD Risk score (Arnett DK, et al., 2019) failed to calculate for the following reasons:   The valid total cholesterol range is 130 to 320 mg/dL    Other: (CHADS2VASc if Afib, PHQ9 if depression, MMRC or CAT for COPD, ACT, DEXA)  Social History   Tobacco Use  Smoking Status Former   Packs/day: 0.50   Years: 41.00   Total pack years: 20.50   Types: Cigarettes   Quit date: 02/29/2012   Years since quitting: 10.1  Smokeless Tobacco Never   BP Readings from Last 3 Encounters:  03/18/22 121/78  02/12/22 139/78  11/23/21 128/74   Pulse Readings from Last 3 Encounters:  03/18/22 94  02/12/22 83  11/23/21 78   Wt Readings from Last 3 Encounters:  03/18/22 294 lb (133.4 kg)  02/12/22 298 lb 3.2 oz (135.3 kg)  11/23/21 (!) 302 lb 6.4 oz (137.2 kg)    Assessment: Review of patient past medical history, allergies, medications, health status, including review of consultants reports, laboratory and other test data, was performed as part of comprehensive evaluation and provision of chronic care management services.   SDOH:  (Social Determinants of Health) assessments and interventions performed:  SDOH Interventions    Flowsheet Row Clinical Support from 09/03/2021 in [place] Visit from 06/11/2021 in [place] Visit from 10/30/2020 in [place] Interventions     Food Insecurity Interventions Intervention Not Indicated -- --  Housing Interventions Intervention Not Indicated -- --  Transportation Interventions Intervention Not Indicated -- --  Depression Interventions/Treatment  -- Currently on Treatment Medication  Financial Strain Interventions Intervention Not Indicated -- --  Physical Activity Interventions Other (Comments) -- --  Stress Interventions Intervention Not Indicated -- --  Social Connections Interventions Intervention Not Indicated -- --        CCM Care Plan  No Known Allergies  Medications Reviewed Today     Reviewed by Lavera Guise, [facility] (Pharmacist) on 03/22/22 at 1153  Med List Status: <None>   Medication Order Taking? Sig Documenting Provider Last Dose Status Informant  ALPRAZolam (XANAX) 1 MG tablet [id number]  Take 0.5-1 tablets (0.5-1 mg total) by mouth 3 (three) times daily as needed for anxiety (take ONLY IF NEEDED). Ronnie Doss M, DO  Active   amLODipine (NORVASC) 5 MG tablet [id number]  Take 1 tablet (5 mg total) by mouth daily. Ronnie Doss M, DO  Active   apixaban (ELIQUIS) 5 MG TABS tablet [id number]  Take 1 tablet (5 mg total) by mouth 2 (two) times daily. Buford Dresser, MD  Active   Continuous Blood Gluc Sensor (FREESTYLE LIBRE [place]) [place] [id number]  Scan to check glucose at least 4 times daily. DX: E11.4 [name], DO  Active Self  dapagliflozin propanediol ([place])  10 MG TABS tablet [id number]  Take 1 tablet (10 mg total) by mouth daily before breakfast. [name], DO  Active            Med Note Blanca Friend, Royce Macadamia   Fri Mar 22, 2022  9:53 AM) Via AZ&me patient assistance program   famotidine (PEPCID) 20 MG tablet [id number]  Take 1 tablet (20 mg total) by mouth 2 (two) times daily. Ronnie Doss M, DO  Active   furosemide (LASIX) 20 MG tablet [id number]  Take 1 tablet (20 mg total) by mouth daily. Buford Dresser, MD  Active   glucose blood [facility] VERIO) test strip [id number]  Use as instructed to monitor glucose 4 times daily (use as back up method for CGM) Brita Romp, NP  Active Self  insulin degludec (TRESIBA FLEXTOUCH) 200 UNIT/ML FlexTouch Pen [id number]  Inject 50 Units into the skin in the morning. Brita Romp, NP  Active   Insulin Pen Needle (B-D ULTRAFINE III SHORT PEN) 31G X 8 MM MISC [id number]  Use to give insulin daily Dx E11.9 Baruch Gouty, FNP  Active Self  Lancets [facility] ULTRASOFT) lancets [id number]  Use as instructed to  monitor glucose 4 times daily (use as back up method for CGM). Brita Romp, NP  Active Self  lisinopril (ZESTRIL) 40 MG tablet [id number]  Take 1 tablet (40 mg total) by mouth daily. Ronnie Doss M, DO  Active   metFORMIN (GLUCOPHAGE-XR) 500 MG 24 hr tablet [id number]  TAKE 2 TABLETS BY MOUTH TWICE A DAY WITH FOOD Ronnie Doss M, DO  Active   mirtazapine (REMERON) 45 MG tablet [id number]  Take 1 tablet (45 mg total) by mouth at bedtime. Ronnie Doss M, DO  Active   omeprazole (PRILOSEC) 20 MG capsule [id number]  Take 20 mg by mouth daily. [provider]  Active Self  rosuvastatin (CRESTOR) 20 MG tablet [id number]  TAKE 1 TABLET BY MOUTH EVERYDAY AT BEDTIME Ronnie Doss M, DO  Active   Semaglutide,0.25 or 0.5MG/DOS, (OZEMPIC, 0.25 OR 0.5 MG/DOSE,) 2 MG/3ML SOPN [id number] Yes Inject 0.5 mg into the skin once a week. [provider]  Active            Med Note Blanca Friend, Royce Macadamia   Fri Mar 22, 2022  9:53 AM) Via novo nordisk patient assistance program    sildenafil (VIAGRA) 50 MG tablet [id number]  Take 1 tablet (50 mg total) by mouth daily as needed for erectile dysfunction. [name], DO  Active Self  Med List Note Loetta Rough 03/31/13 1303): Lisinopril (strength)             Patient Active Problem List   Diagnosis Date Noted   Acute CHF ([place]) 10/29/2021   Atrial fibrillation ([place]) 10/29/2021   Proliferative diabetic retinopathy of left eye with macular edema associated with type 2 diabetes mellitus ([place]) 11/09/2019   Diabetic macular edema of right eye with proliferative retinopathy associated with type 2 diabetes mellitus ([place]) 11/09/2019   Optic atrophy associated with retinal dystrophies 11/09/2019   Primary open angle glaucoma of both eyes, severe stage 11/09/2019   Low TSH level 07/07/2019   Diabetic retinopathy of both eyes ([place]) 07/07/2019   Constipation due to pain medication 04/06/2019   Morbid obesity ([place]) 04/06/2019    Controlled substance agreement signed 04/06/2019   Corneal edema 12/16/2018   Diabetes mellitus ([place]) 10/12/2018   Primary open angle glaucoma (POAG) of right eye, severe stage 09/15/2017  Coronary artery disease due to lipid rich plaque 10/28/2016   Gastroesophageal reflux disease without esophagitis 01/30/2016   Chronic bilateral low back pain without sciatica 01/03/2015   Hypertension associated with diabetes ([place]) 01/03/2015   Primary osteoarthritis involving multiple joints 01/03/2015   Vitamin D deficiency 01/03/2015   GAD (generalized anxiety disorder) 01/03/2015   Erectile dysfunction associated with type 2 diabetes mellitus ([place]) 12/21/2013    Immunization History  Administered Date(s) Administered   Fluad Quad(high Dose 65+) 04/30/2017, 04/13/2019   Influenza Split 04/19/2014, 05/29/2020   Influenza-Unspecified 05/18/2014, 05/09/2015, 04/22/2018, 05/24/2020, 05/19/2021   Moderna SARS-COV2 Booster Vaccination 12/13/2020   Moderna Sars-Covid-2 Vaccination 09/30/2019, 10/29/2019, 06/24/2020, 12/13/2020   Pneumococcal Conjugate-13 04/23/2018   Pneumococcal Polysaccharide-23 12/28/2015   Zoster Recombinat (Shingrix) 02/12/2022   Zoster, Live 01/04/2016    Conditions to be addressed/monitored: HLD and DMII  Care Plan : PHARMD MEDICATION MANAGEMENT  Updates made by Lavera Guise, RPH since 04/30/2022 12:00 AM     Problem: DISEASE PROGRESSION PREVENTION      Goal: T2DM, CHF PHARMD GOAL   Recent Progress: On track  Priority: High  Note:   Current Barriers:  Unable to independently afford treatment regimen Unable to maintain control of T2DM  Pharmacist Clinical Goal(s):  Over the next 90 days, patient will verbalize ability to afford treatment regimen maintain control of T2DM as evidenced by [place]  through collaboration with PharmD and provider.    Interventions: 1:1 collaboration with [name], DO regarding development and  update of comprehensive plan of care as evidenced by provider attestation and co-signature Inter-disciplinary care team collaboration (see longitudinal plan of care) Comprehensive medication review performed; medication list updated in electronic medical record  Diabetes: Uncontrolled (improved); current treatment: TRESIBA 50-60 units daily, metformin, FARXIGA 10MG DAILY; Ozempic 0.6m weekly A1c 8.1%--> 7.7% CONTINUE Ozempic 0.274mweekly  Denies personal and family history of Medullary thyroid cancer (MTC) Submitted novo nordisk application Sample given today to bridge to patient supply CONTINUE [place]; no side effects; new onset heart failure Okay with cards, endocrine and PCP Approved for az&me PAP--ships to home Continue Tresiba Patient no longer on Novolog meal time insulin with addition of Ozempic Follows with endocrine Current glucose readings: fasting glucose: <130, post prandial glucose: 150-180 Uses libre CGM/[facility] via [facility] portal Denies hypoglycemic/hyperglycemic symptoms Discussed meal planning options and Plate method for healthy eating Avoid sugary drinks and desserts Incorporate balanced protein, non starchy veggies, 1 serving of carbohydrate with each meal Increase water intake Increase physical activity as able Current exercise: n/a Assessed patient finances. Re-enrollment approved for novo nordisk patient assistance program for 2023; [place] PCP office; [place] [facility] on behalf of az&me PAP Education provided on diet and lifestyle modifications   Patient Goals/Self-Care Activities Over the next 90 days, patient will:  - take medications as prescribed check glucose 3 times daily or if symptomatic, document, and provide at future appointments  Follow Up Plan: Telephone follow up appointment with care management team member scheduled for: 3 months      Medication Assistance:  tresiba,  ozempic, farxiga obtained through novo nordisk & az&me medication assistance program.  Enrollment ends 07/2022  Follow Up:  Patient agrees to Care Plan and Follow-up.  Plan: Telephone follow up appointment with care management team member scheduled for:  3 months   JuRegina EckPharmD, BCPS Clinical Pharmacist, [place] Phone [phone]

## 2022-04-30 NOTE — Patient Instructions (Signed)
Visit Information  Following are the goals we discussed today:  (Copy and paste patient goals from clinical care plan here)  Plan: Telephone follow up appointment with care management team member scheduled for:  3 months  Signature Regina Eck, PharmD, BCPS Clinical Pharmacist, [place]  [facility] Phone [phone]   Please call the care guide team at [phone] if you need to cancel or reschedule your appointment.   The patient verbalized understanding of instructions, educational materials, and care plan provided today and DECLINED offer to receive copy of patient instructions, educational materials, and care plan.

## 2022-04-30 NOTE — Telephone Encounter (Signed)
Yes. Ok to write

## 2022-05-04 DIAGNOSIS — E1140 Type 2 diabetes mellitus with diabetic neuropathy, unspecified: Secondary | ICD-10-CM

## 2022-05-04 DIAGNOSIS — E1169 Type 2 diabetes mellitus with other specified complication: Secondary | ICD-10-CM

## 2022-05-04 DIAGNOSIS — E785 Hyperlipidemia, unspecified: Secondary | ICD-10-CM

## 2022-05-04 DIAGNOSIS — Z794 Long term (current) use of insulin: Secondary | ICD-10-CM

## 2022-05-09 NOTE — Telephone Encounter (Signed)
NOVOLOG REFILLS CANCELLED.

## 2022-05-15 ENCOUNTER — Ambulatory Visit (INDEPENDENT_AMBULATORY_CARE_PROVIDER_SITE_OTHER): Payer: Medicare Other | Admitting: Family Medicine

## 2022-05-15 ENCOUNTER — Encounter: Payer: Self-pay | Admitting: Family Medicine

## 2022-05-15 VITALS — BP 120/72 | HR 86 | Temp 98.6°F | Ht 72.0 in | Wt 287.8 lb

## 2022-05-15 DIAGNOSIS — I152 Hypertension secondary to endocrine disorders: Secondary | ICD-10-CM

## 2022-05-15 DIAGNOSIS — E1169 Type 2 diabetes mellitus with other specified complication: Secondary | ICD-10-CM | POA: Diagnosis not present

## 2022-05-15 DIAGNOSIS — E785 Hyperlipidemia, unspecified: Secondary | ICD-10-CM

## 2022-05-15 DIAGNOSIS — E1140 Type 2 diabetes mellitus with diabetic neuropathy, unspecified: Secondary | ICD-10-CM | POA: Diagnosis not present

## 2022-05-15 DIAGNOSIS — Z23 Encounter for immunization: Secondary | ICD-10-CM | POA: Diagnosis not present

## 2022-05-15 DIAGNOSIS — I480 Paroxysmal atrial fibrillation: Secondary | ICD-10-CM

## 2022-05-15 DIAGNOSIS — Z794 Long term (current) use of insulin: Secondary | ICD-10-CM | POA: Diagnosis not present

## 2022-05-15 DIAGNOSIS — E1159 Type 2 diabetes mellitus with other circulatory complications: Secondary | ICD-10-CM

## 2022-05-15 DIAGNOSIS — F411 Generalized anxiety disorder: Secondary | ICD-10-CM

## 2022-05-15 LAB — BAYER DCA HB A1C WAIVED: HB A1C (BAYER DCA - WAIVED): 9.2 % — ABNORMAL HIGH (ref 4.8–5.6)

## 2022-05-15 MED ORDER — ALPRAZOLAM 1 MG PO TABS
0.5000 mg | ORAL_TABLET | Freq: Three times a day (TID) | ORAL | 1 refills | Status: DC | PRN
Start: 2022-05-15 — End: 2022-11-12

## 2022-05-15 NOTE — Patient Instructions (Signed)
Ozempic should be 0.'5mg'$  every 7 days!  Mix 1 capful of Miralax in 8 ounces of water and drink daily for constipation as needed.

## 2022-05-15 NOTE — Progress Notes (Signed)
Subjective: CC:DM PCP: [name], DO [name] is a 71 y.o. male presenting to clinic today for:  1. Type 2 Diabetes with hypertension, hyperlipidemia:  He was discontinued on the insulin with meals a couple of months ago and placed on Ozempic.  He started at 0.25 mg subcutaneously each week and was supposed to go up to 0.5 mg but he did not realize he was post to advance the dose so he has continued 0.25 mg.  He has been continued on all other medications and does not report any complications or issues with them.  Last eye exam: UTD Last foot exam: UTD Last A1c:  Lab Results  Component Value Date   HGBA1C 7.7 (H) 02/12/2022   Nephropathy screen indicated?: needs Last flu, zoster and/or pneumovax:  Immunization History  Administered Date(s) Administered   Fluad Quad(high Dose 65+) 04/30/2017, 04/13/2019   Influenza Split 04/19/2014, 05/29/2020   Influenza-Unspecified 05/18/2014, 05/09/2015, 04/22/2018, 05/24/2020, 05/19/2021   Moderna SARS-COV2 Booster Vaccination 12/13/2020   Moderna Sars-Covid-2 Vaccination 09/30/2019, 10/29/2019, 06/24/2020, 12/13/2020   Pneumococcal Conjugate-13 04/23/2018   Pneumococcal Polysaccharide-23 12/28/2015   Zoster Recombinat (Shingrix) 02/12/2022   Zoster, Live 01/04/2016    ROS: denies dizziness, chest pain, shortness of breath.  He reports normal urine output.  Hydrating well.  Admits to some constipation.  Having bowel movements roughly every 3 days and this is not relieved by over-the-counter stool softeners.  He denies any genital concerns including dysuria, penile pain or discoloration.  2.  Anxiety disorder Continues to try and use that alprazolam sparingly.  Last refill was in July.  Denies any excessive daytime sedation, falls, memory changes  ROS: Per HPI  No Known Allergies Past Medical History:  Diagnosis Date   Anxiety    Arthritis    Diabetes mellitus without complication (HCC)    GERD (gastroesophageal  reflux disease)    Hypercholesteremia    Hypertension     Current Outpatient Medications:    ALPRAZolam (XANAX) 1 MG tablet, Take 0.5-1 tablets (0.5-1 mg total) by mouth 3 (three) times daily as needed for anxiety (take ONLY IF NEEDED)., Disp: 90 tablet, Rfl: 1   amLODipine (NORVASC) 5 MG tablet, Take 1 tablet (5 mg total) by mouth daily., Disp: 90 tablet, Rfl: 3   apixaban (ELIQUIS) 5 MG TABS tablet, Take 1 tablet (5 mg total) by mouth 2 (two) times daily., Disp: 180 tablet, Rfl: 3   Continuous Blood Gluc Sensor (FREESTYLE LIBRE 14 DAY SENSOR) MISC, Scan to check glucose at least 4 times daily. DX: E11.4, Disp: 2 each, Rfl: 12   dapagliflozin propanediol (FARXIGA) 10 MG TABS tablet, Take 1 tablet (10 mg total) by mouth daily before breakfast., Disp: 90 tablet, Rfl: 5   famotidine (PEPCID) 20 MG tablet, Take 1 tablet (20 mg total) by mouth 2 (two) times daily., Disp: 180 tablet, Rfl: 3   furosemide (LASIX) 20 MG tablet, Take 1 tablet (20 mg total) by mouth daily., Disp: 90 tablet, Rfl: 3   glucose blood (ONETOUCH VERIO) test strip, Use as instructed to monitor glucose 4 times daily (use as back up method for CGM), Disp: 100 each, Rfl: 12   insulin degludec (TRESIBA FLEXTOUCH) 200 UNIT/ML FlexTouch Pen, Inject 50 Units into the skin in the morning., Disp: 18 mL, Rfl: 5   Insulin Pen Needle (B-D ULTRAFINE III SHORT PEN) 31G X 8 MM MISC, Use to give insulin daily Dx E11.9, Disp: 100 each, Rfl: 3   Lancets (ONETOUCH ULTRASOFT) lancets,  Use as instructed to monitor glucose 4 times daily (use as back up method for CGM)., Disp: 100 each, Rfl: 12   lisinopril (ZESTRIL) 40 MG tablet, Take 1 tablet (40 mg total) by mouth daily., Disp: 90 tablet, Rfl: 3   metFORMIN (GLUCOPHAGE-XR) 500 MG 24 hr tablet, TAKE 2 TABLETS BY MOUTH TWICE A DAY WITH FOOD, Disp: 360 tablet, Rfl: 3   mirtazapine (REMERON) 45 MG tablet, Take 1 tablet (45 mg total) by mouth at bedtime., Disp: 90 tablet, Rfl: 3   omeprazole (PRILOSEC)  20 MG capsule, Take 20 mg by mouth daily., Disp: , Rfl:    rosuvastatin (CRESTOR) 20 MG tablet, TAKE 1 TABLET BY MOUTH EVERYDAY AT BEDTIME, Disp: 90 tablet, Rfl: 3   Semaglutide,0.25 or 0.'5MG'$ /DOS, (OZEMPIC, 0.25 OR 0.5 MG/DOSE,) 2 MG/3ML SOPN, Inject 0.5 mg into the skin once a week., Disp: , Rfl:    sildenafil (VIAGRA) 50 MG tablet, Take 1 tablet (50 mg total) by mouth daily as needed for erectile dysfunction., Disp: 10 tablet, Rfl: 2 Social History   Socioeconomic History   Marital status: Divorced    Spouse name: Not on file   Number of children: Not on file   Years of education: Not on file   Highest education level: Not on file  Occupational History   Not on file  Tobacco Use   Smoking status: Former    Packs/day: 0.50    Years: 41.00    Total pack years: 20.50    Types: Cigarettes    Quit date: 02/29/2012    Years since quitting: 10.2   Smokeless tobacco: Never  Vaping Use   Vaping Use: Never used  Substance and Sexual Activity   Alcohol use: No    Comment: quit 2012   Drug use: No    Comment: quit in 2012   Sexual activity: Yes    Birth control/protection: None  Other Topics Concern   Not on file  Social History Narrative   Not on file   Social Determinants of Health   Financial Resource Strain: Low Risk  (09/03/2021)   Overall Financial Resource Strain (CARDIA)    Difficulty of Paying Living Expenses: Not hard at all  Food Insecurity: No Food Insecurity (09/03/2021)   Hunger Vital Sign    Worried About Running Out of Food in the Last Year: Never true    [place] in the Last Year: Never true  Transportation Needs: No Transportation Needs (09/03/2021)   PRAPARE - Hydrologist (Medical): No    Lack of Transportation (Non-Medical): No  Physical Activity: Insufficiently Active (09/03/2021)   Exercise Vital Sign    Days of Exercise per Week: 3 days    Minutes of Exercise per Session: 30 min  Stress: No Stress Concern Present  (09/03/2021)   [place]    Feeling of Stress : Not at all  Social Connections: Moderately Integrated (09/03/2021)   Social Connection and Isolation Panel [NHANES]    Frequency of Communication with Friends and Family: More than three times a week    Frequency of Social Gatherings with Friends and Family: More than three times a week    Attends Religious Services: More than 4 times per year    Active Member of [employer] or Organizations: Yes    Attends Music therapist: More than 4 times per year    Marital Status: Divorced  Human resources officer Violence: Not At  Risk (09/03/2021)   Humiliation, Afraid, Rape, and Kick questionnaire    Fear of Current or Ex-Partner: No    Emotionally Abused: No    Physically Abused: No    Sexually Abused: No   Family History  Problem Relation Age of Onset   Diabetes Mother    Glaucoma Mother     Objective: Office vital signs reviewed. BP 120/72   Pulse 86   Temp 98.6 F (37 C)   Ht 6' (1.829 m)   Wt 287 lb 12.8 oz (130.5 kg)   SpO2 96%   BMI 39.03 kg/m   Physical Examination:  General: Awake, alert, nontoxic male, No acute distress HEENT: Sclera white.  Moist mucous membranes Cardio: regular rate and rhythm, S1S2 heard, no murmurs appreciated Pulm: clear to auscultation bilaterally, no wheezes, rhonchi or rales; normal work of breathing on room air GI: Protuberant abdomen.  Nontender MSK: Leg with use of cane  Assessment/ Plan: 71 y.o. male   Type 2 diabetes mellitus with diabetic neuropathy, with long-term current use of insulin ([place]) - Plan: Bayer DCA Hb A1c Waived, Microalbumin / creatinine urine ratio  Need for immunization against influenza - Plan: Flu Vaccine QUAD High Dose(Fluad)  Hyperlipidemia associated with type 2 diabetes mellitus ([place])  Hypertension associated with diabetes ([place])  GAD (generalized anxiety disorder) - Plan: ALPRAZolam (XANAX) 1 MG  tablet  Paroxysmal A-fib ([place])  Sugar unfortunately has risen quite a bit since his last checkup.  A1c is over 9 now.  I suspect this is largely because he did not understand he was post to advance the Ozempic to 0.5 mg.  I reiterated that to him today and also placed on the AVS so that he would not forget.  I will CC his endocrinologist as FYI.  Influenza vaccination administered  Blood pressure well controlled.  Not due for fasting lipid.  Continue all meds as prescribed  Alprazolam renewed for as needed use.  Up-to-date on UDS and CSC.  National narcotic database reviewed and there were no red flags.  He only filled one of the refills that were sent in in July.  He had regular rate and rhythm on exam today.  No red flag signs or symptoms with Eliquis use  Orders Placed This Encounter  Procedures   Bayer DCA Hb A1c Waived   Microalbumin / creatinine urine ratio   No orders of the defined types were placed in this encounter.    [name], DO [place] [phone]

## 2022-05-16 LAB — MICROALBUMIN / CREATININE URINE RATIO
Creatinine, Urine: 45.9 mg/dL
Microalb/Creat Ratio: 50 mg/g creat — ABNORMAL HIGH (ref 0–29)
Microalbumin, Urine: 22.8 ug/mL

## 2022-05-22 ENCOUNTER — Encounter (HOSPITAL_BASED_OUTPATIENT_CLINIC_OR_DEPARTMENT_OTHER): Payer: Self-pay | Admitting: Cardiology

## 2022-05-22 ENCOUNTER — Ambulatory Visit (HOSPITAL_BASED_OUTPATIENT_CLINIC_OR_DEPARTMENT_OTHER): Payer: Medicare Other | Admitting: Cardiology

## 2022-05-22 VITALS — BP 118/64 | HR 56 | Ht 72.0 in | Wt 290.8 lb

## 2022-05-22 DIAGNOSIS — I4819 Other persistent atrial fibrillation: Secondary | ICD-10-CM | POA: Diagnosis not present

## 2022-05-22 DIAGNOSIS — I10 Essential (primary) hypertension: Secondary | ICD-10-CM | POA: Diagnosis not present

## 2022-05-22 DIAGNOSIS — E119 Type 2 diabetes mellitus without complications: Secondary | ICD-10-CM | POA: Diagnosis not present

## 2022-05-22 DIAGNOSIS — I5032 Chronic diastolic (congestive) heart failure: Secondary | ICD-10-CM | POA: Diagnosis not present

## 2022-05-22 DIAGNOSIS — Z794 Long term (current) use of insulin: Secondary | ICD-10-CM | POA: Diagnosis not present

## 2022-05-22 NOTE — Patient Instructions (Signed)

## 2022-05-22 NOTE — Progress Notes (Signed)
Cardiology Office Note:    Date:  05/22/2022   ID:  [name], DOB 1950/10/27, MRN [id number]  PCP:  Charles Norlander, DO  Cardiologist:  Charles Dresser, MD  Referring MD: Charles Norlander, DO   CC: follow up  History of Present Illness:    [name] is a 71 y.o. male with a hx of hypertension, hyperlipidemia, type II diabetes who is seen for follow up. Initially seen at the request of Charles Norlander, DO for the evaluation and management of hospitalization for shortness of breath.  CV history: Hospitalized, discharged 11/01/21. Noted to have new atrial fibrillation during admission, started on apixaban. Treated for acute diastolic heart failure during admission.  Today:  He says he has been pretty good.   He states that his breathing has been improved and he is able to do more activities around his yard.  He reports that he has been dealing with constipation lately and went to his PCP for it, who gave him something like Miralax.  He denies any palpitations, chest pain, shortness of breath, or peripheral edema. No lightheadedness, headaches, syncope, orthopnea, or PND.  Past Medical History:  Diagnosis Date   Anxiety    Arthritis    Diabetes mellitus without complication (HCC)    GERD (gastroesophageal reflux disease)    Hypercholesteremia    Hypertension     Past Surgical History:  Procedure Laterality Date   boils     removed form back of skull   CATARACT EXTRACTION W/PHACO Right 04/02/2013   Procedure: CATARACT EXTRACTION PHACO AND INTRAOCULAR LENS PLACEMENT ([place]);  Surgeon: Williams Che, MD;  Location: [facility];  Service: Ophthalmology;  Laterality: Right;  CDE 7.00   CATARACT EXTRACTION W/PHACO Left 10/04/2013   Procedure: CATARACT EXTRACTION PHACO AND INTRAOCULAR LENS PLACEMENT ([facility]);  Surgeon: Williams Che, MD;  Location: [facility];  Service: Ophthalmology;  Laterality: Left;  CDE:1.46   COLONOSCOPY N/A 07/03/2015   Procedure: COLONOSCOPY;   Surgeon: Rogene Houston, MD;  Location: [facility];  Service: Endoscopy;  Laterality: N/A;  830   EYE SURGERY Right    "valve replaced and stent placed" per pt to reroute the fluid in his eye   ROTATOR CUFF REPAIR Right     Current Medications: Current Outpatient Medications on File Prior to Visit  Medication Sig   ALPRAZolam (XANAX) 1 MG tablet Take 0.5-1 tablets (0.5-1 mg total) by mouth 3 (three) times daily as needed for anxiety (take ONLY IF NEEDED).   amLODipine (NORVASC) 5 MG tablet Take 1 tablet (5 mg total) by mouth daily.   apixaban (ELIQUIS) 5 MG TABS tablet Take 1 tablet (5 mg total) by mouth 2 (two) times daily.   Continuous Blood Gluc Sensor (FREESTYLE LIBRE 14 DAY SENSOR) MISC Scan to check glucose at least 4 times daily. DX: E11.4   dapagliflozin propanediol (FARXIGA) 10 MG TABS tablet Take 1 tablet (10 mg total) by mouth daily before breakfast.   famotidine (PEPCID) 20 MG tablet Take 1 tablet (20 mg total) by mouth 2 (two) times daily.   furosemide (LASIX) 20 MG tablet Take 1 tablet (20 mg total) by mouth daily.   glucose blood (ONETOUCH VERIO) test strip Use as instructed to monitor glucose 4 times daily (use as back up method for CGM)   insulin degludec (TRESIBA FLEXTOUCH) 200 UNIT/ML FlexTouch Pen Inject 50 Units into the skin in the morning.   Insulin Pen Needle (B-D ULTRAFINE III SHORT PEN) 31G X  8 MM MISC Use to give insulin daily Dx E11.9   Lancets (ONETOUCH ULTRASOFT) lancets Use as instructed to monitor glucose 4 times daily (use as back up method for CGM).   lisinopril (ZESTRIL) 40 MG tablet Take 1 tablet (40 mg total) by mouth daily.   metFORMIN (GLUCOPHAGE-XR) 500 MG 24 hr tablet TAKE 2 TABLETS BY MOUTH TWICE A DAY WITH FOOD   mirtazapine (REMERON) 45 MG tablet Take 1 tablet (45 mg total) by mouth at bedtime.   omeprazole (PRILOSEC) 20 MG capsule Take 20 mg by mouth daily.   rosuvastatin (CRESTOR) 20 MG tablet TAKE 1 TABLET BY MOUTH EVERYDAY AT BEDTIME    Semaglutide,0.25 or 0.'5MG'$ /DOS, (OZEMPIC, 0.25 OR 0.5 MG/DOSE,) 2 MG/3ML SOPN Inject 0.5 mg into the skin once a week.   sildenafil (VIAGRA) 50 MG tablet Take 1 tablet (50 mg total) by mouth daily as needed for erectile dysfunction.   No current facility-administered medications on file prior to visit.     Allergies:   Patient has no known allergies.   Social History   Tobacco Use   Smoking status: Former    Packs/day: 0.50    Years: 41.00    Total pack years: 20.50    Types: Cigarettes    Quit date: 02/29/2012    Years since quitting: 10.2   Smokeless tobacco: Never  Vaping Use   Vaping Use: Never used  Substance Use Topics   Alcohol use: No    Comment: quit 2012   Drug use: No    Comment: quit in 2012    Family History: family history includes Diabetes in his mother; Glaucoma in his mother.  ROS:   Please see the history of present illness.   (+) Constipation   Additional pertinent ROS otherwise negative.   EKGs/Labs/Other Studies Reviewed:    The following studies were reviewed today:  Echo 10/30/21: 1. Left ventricular ejection fraction, by estimation, is 60 to 65%. The  left ventricle has normal function. The left ventricle has no regional  wall motion abnormalities. There is mild left ventricular hypertrophy.  Left ventricular diastolic parameters  are indeterminate.   2. Right ventricular systolic function is normal. The right ventricular  size is mildly enlarged.   3. Left atrial size was mildly dilated.   4. The mitral valve is normal in structure. No evidence of mitral valve  regurgitation. No evidence of mitral stenosis.   5. The aortic valve is tricuspid. There is mild calcification of the  aortic valve. Aortic valve regurgitation is not visualized. Aortic valve  sclerosis is present, with no evidence of aortic valve stenosis.   6. The inferior vena cava is normal in size with greater than 50%  respiratory variability, suggesting right atrial pressure  of 3 mmHg.   CTA Chest PE 10/29/2021: IMPRESSION: 1. No CT evidence for acute pulmonary embolus. 2. 5 mm right lower lobe pulmonary nodule with 16 mm nodular opacity in the deep posterior left costophrenic sulcus. Follow-up CT chest in 3 months is recommended to ensure resolution. 3. Tiny bilateral pleural effusions with bibasilar subsegmental atelectasis. 4. Mild lymphadenopathy in the mediastinum. Attention on follow-up 3 month CT recommended. 5. 2.1 cm left thyroid nodule. Recommend thyroid US (ref: J Am Coll Radiol. 2015 Feb;12(2): 143-50). 6. Aortic Atherosclerosis (ICD10-I70.0).   EKG:  EKG is personally reviewed.   05/22/22: not ordered today 11/23/21: atrial fibrillation at 78 bpm  Recent Labs: 10/29/2021: ALT 17; B Natriuretic Peptide 258.0; TSH 0.663 11/01/2021: Magnesium 2.1 11/06/2021:  Hemoglobin 14.1; Platelets 252 11/23/2021: BUN 21; Creatinine, Ser 0.93; Potassium 5.0; Sodium 145  Recent Lipid Panel    Component Value Date/Time   CHOL 103 06/11/2021 0958   TRIG 98 06/11/2021 0958   HDL 27 (L) 06/11/2021 0958   CHOLHDL 3.8 06/11/2021 0958   LDLCALC 57 06/11/2021 0958    Physical Exam:    VS:  BP 118/64 (BP Location: Right Arm, Patient Position: Sitting, Cuff Size: Large)   Pulse (!) 56   Ht 6' (1.829 m)   Wt 290 lb 12.8 oz (131.9 kg)   SpO2 95%   BMI 39.44 kg/m     Wt Readings from Last 3 Encounters:  05/15/22 287 lb 12.8 oz (130.5 kg)  03/18/22 294 lb (133.4 kg)  02/12/22 298 lb 3.2 oz (135.3 kg)    GEN: Well nourished, well developed in no acute distress HEENT: Normal, moist mucous membranes NECK: No JVD CARDIAC: irregular rhythm, normal S1 and S2, no rubs or gallops. No murmur. VASCULAR: Radial and DP pulses 2+ bilaterally. No carotid bruits RESPIRATORY:  Clear to auscultation without rales, wheezing or rhonchi  ABDOMEN: Soft, non-tender, non-distended MUSCULOSKELETAL:  Ambulates independently SKIN: Warm and dry, no edema NEUROLOGIC:  Alert and  oriented x 3. No focal neuro deficits noted. PSYCHIATRIC:  Normal affect    ASSESSMENT:    1. Persistent atrial fibrillation ([place])   2. Chronic diastolic heart failure ([place])   3. Essential hypertension   4. Type 2 diabetes mellitus without complication, with long-term current use of insulin ([place])   5. Morbid obesity ([place])     PLAN:    Atrial fibrillation CHA2DS2/VAS Stroke Risk Points= 5 -on apixaban, tolerating -discussed cardioversion. He would like to defer this for now.  He is asymptomatic in his rate controlled atrial fibrillation -reviewed red flag warning signs that need immediate medical attention  chronic diastolic heart failure -EF 60-65%, indeterminate diastolic function, RAP 3 -appears euvolemic today -on farxiga for SGLT2i -continue furosemide  Type II diabetes, on insulin -on farxiga -on apixaban, so no aspirin -continue rosuvastatin -on insulin, metformin, semaglutide  Morbid obesity: BMI 39 with comorbidities -working on weight loss, on GLP1RA  Hypertension -continue amlodipine, furosemide, lisinopril  Cardiac risk counseling and prevention recommendations: -recommend heart healthy/Mediterranean diet, with whole grains, fruits, vegetable, fish, lean meats, nuts, and olive oil. Limit salt. -recommend moderate walking, 3-5 times/week for 30-50 minutes each session. Aim for at least 150 minutes.week. Goal should be pace of 3 miles/hours, or walking 1.5 miles in 30 minutes -recommend avoidance of tobacco products. Avoid excess alcohol. -ASCVD risk score: The ASCVD Risk score (Arnett DK, et al., 2019) failed to calculate for the following reasons:   The valid total cholesterol range is 130 to 320 mg/dL    Plan for follow up: 6 months.   Charles Dresser, MD, PhD, [facility]    Medication Adjustments/Labs and Tests Ordered: Current medicines are reviewed at length with the patient today.  Concerns regarding medicines are  outlined above.  No orders of the defined types were placed in this encounter.  No orders of the defined types were placed in this encounter.  Patient Instructions  Medication Instructions:  Your Physician recommend you continue on your current medication as directed.    *If you need a refill on your cardiac medications before your next appointment, please call your pharmacy*   Lab Work: None ordered today   Testing/Procedures: None ordered today   Follow-Up: At [facility],  you and your health needs are our priority.  As part of our continuing mission to provide you with exceptional heart care, we have created designated Provider Care Teams.  These Care Teams include your primary Cardiologist (physician) and Advanced Practice Providers (APPs -  Physician Assistants and Nurse Practitioners) who all work together to provide you with the care you need, when you need it.  We recommend signing up for the patient portal called "MyChart".  Sign up information is provided on this After Visit Summary.  MyChart is used to connect with patients for Virtual Visits (Telemedicine).  Patients are able to view lab/test results, encounter notes, upcoming appointments, etc.  Non-urgent messages can be sent to your provider as well.   To learn more about what you can do with MyChart, go to [email or web address].    Your next appointment:   6 month(s)  The format for your next appointment:   In Person  Provider:   Buford Dresser, MD           I,Breanna Adamick,acting as a scribe for Charles Dresser, MD.,have documented all relevant documentation on the behalf of Charles Dresser, MD,as directed by  Charles Dresser, MD while in the presence of Charles Dresser, MD.  I, Charles Dresser, MD, have reviewed all documentation for this visit. The documentation on 08/25/22 for the exam, diagnosis, procedures, and orders are all accurate and complete.    Signed, Charles Dresser, MD PhD 05/22/2022     [place]

## 2022-06-17 IMAGING — CT CT ANGIO CHEST
2 of 10 series · 13 of 46 positions shown · IV contrast (Omnipaque or Isovue)
Comparison: None.

CLINICAL DATA: 2 day history of worsening shortness of breath. PE
suspected.

EXAM:
CT ANGIOGRAPHY CHEST WITH CONTRAST
TECHNIQUE: Multidetector CT imaging of the chest was performed using the
standard protocol during bolus administration of intravenous
contrast. Multiplanar CT image reconstructions and MIPs were
obtained to evaluate the vascular anatomy.

[Series 7: cor soft · coronal · 0.69mm/px · 3 of 184 slices shown]
[im 46/184  soft-tissue]
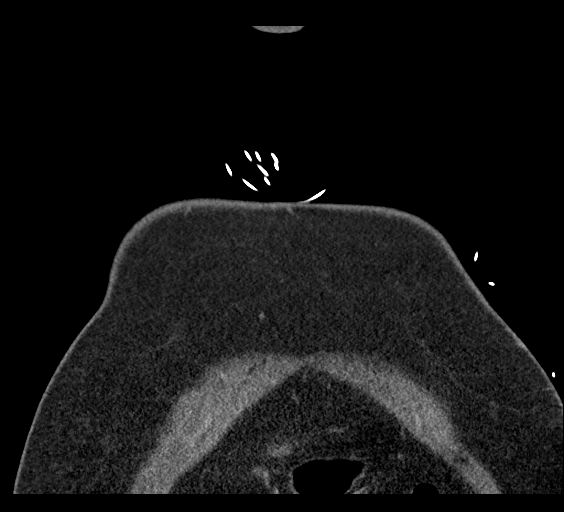
[im 92/184  soft-tissue]
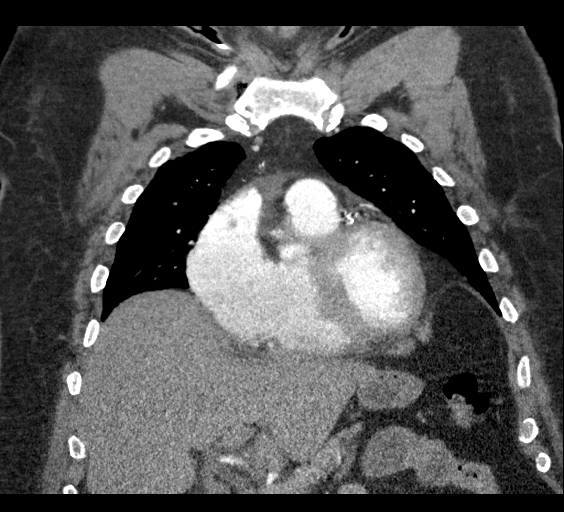
[im 138/184  soft-tissue]
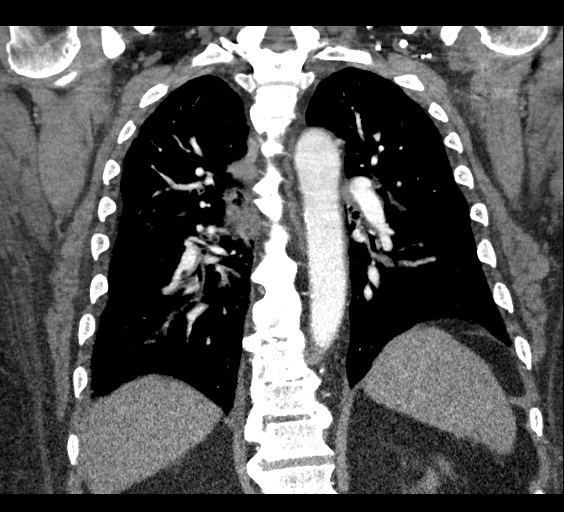

[Series 11: super d · axial · 0.86mm/px · z∈[+1763,+2027]mm · 10 of 404 slices shown]
[im 37/404  lung]
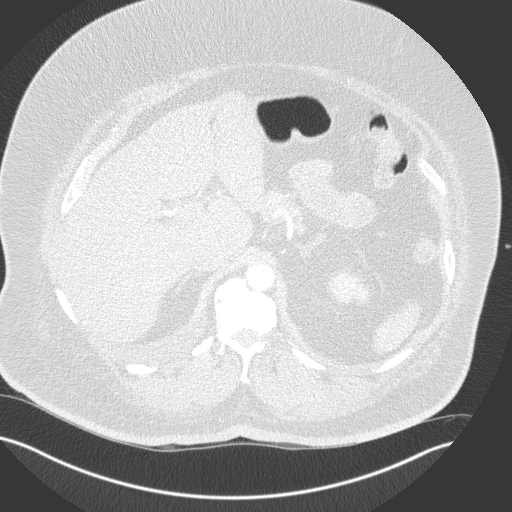
[im 74/404  soft-tissue]
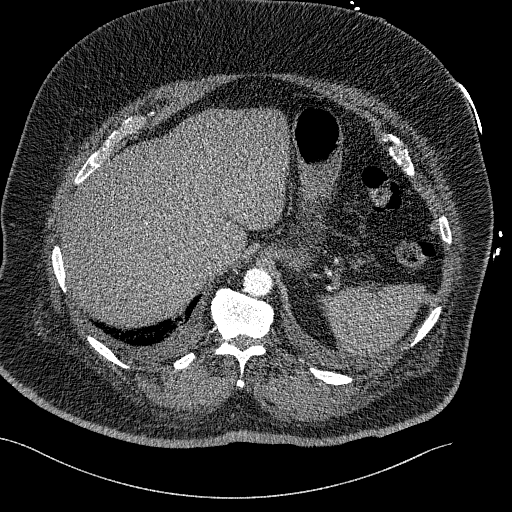
[im 110/404  lung]
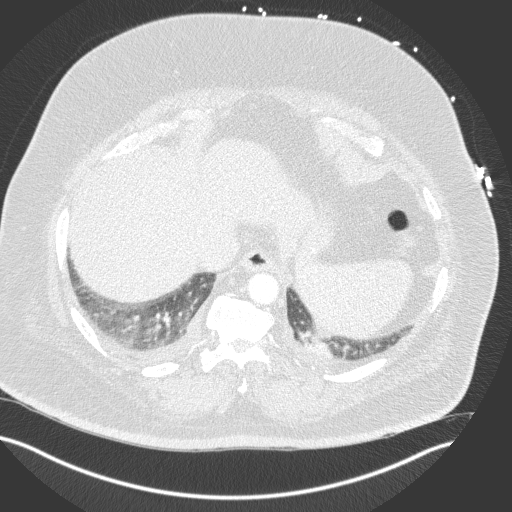
[im 147/404  soft-tissue]
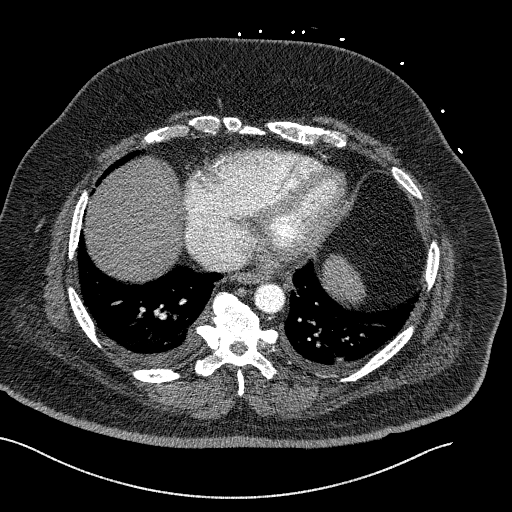
[im 184/404  lung]
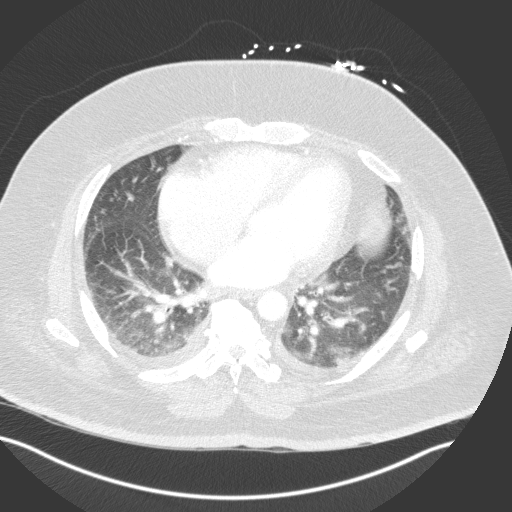
[im 220/404  soft-tissue]
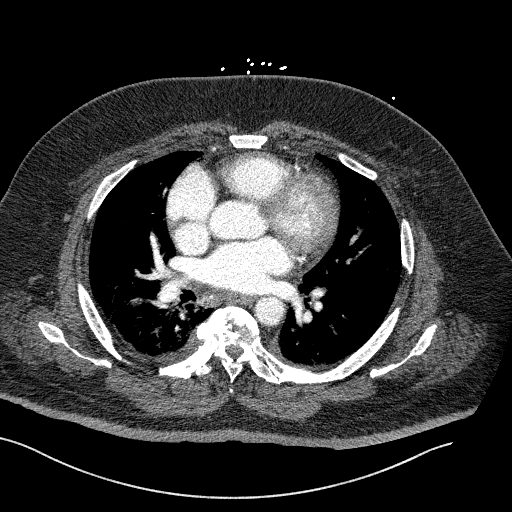
[im 257/404  lung]
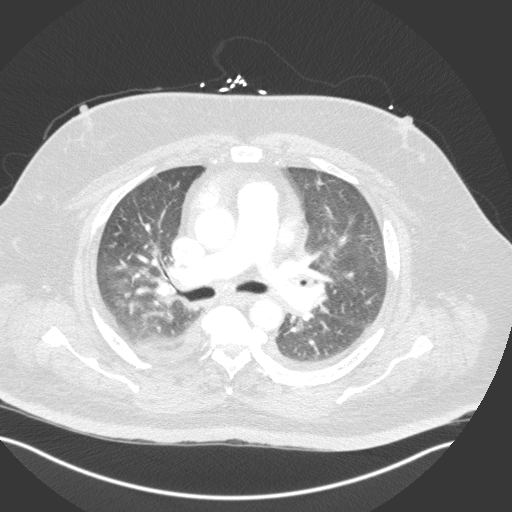
[im 294/404  soft-tissue]
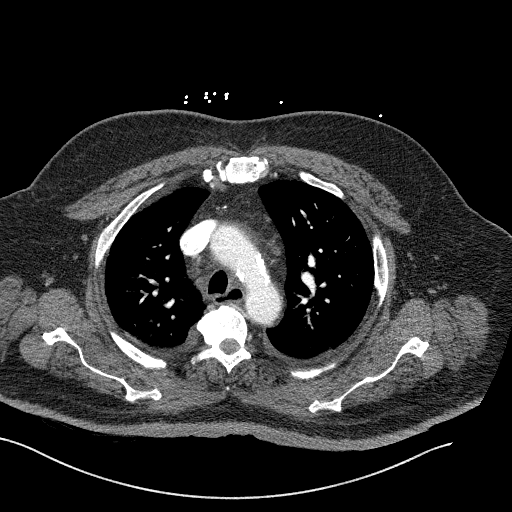
[im 330/404  lung]
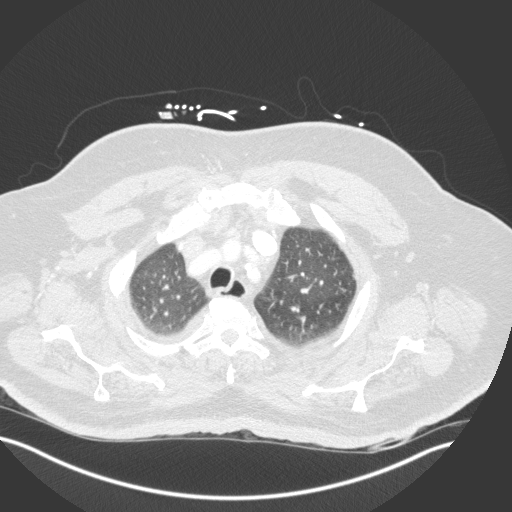
[im 367/404  soft-tissue]
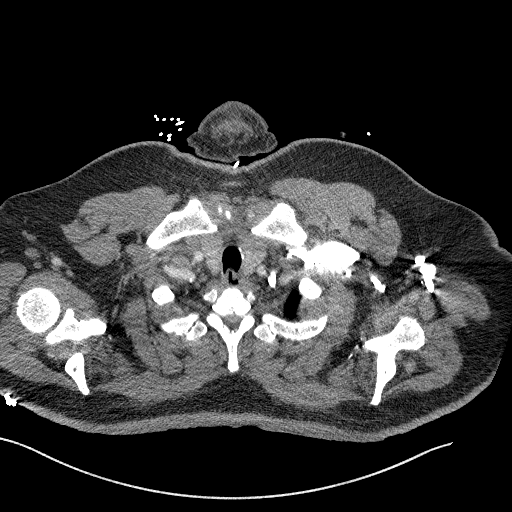

[13 of 46 positions shown; findings below may reference images not displayed]

RADIATION DOSE REDUCTION: This exam was performed according to the
departmental dose-optimization program which includes automated
exposure control, adjustment of the mA and/or kV according to
patient size and/or use of iterative reconstruction technique.

CONTRAST:  80mL OMNIPAQUE IOHEXOL 350 MG/ML SOLN
FINDINGS: Cardiovascular: Heart is enlarged. No substantial pericardial
effusion. Coronary artery calcification is evident. Mild
atherosclerotic calcification is noted in the wall of the thoracic
aorta. There is no filling defect within the opacified pulmonary
arteries to suggest the presence of an acute pulmonary embolus.

Mediastinum/Nodes: 2.1 cm left thyroid nodule identified on image
13/series 4 mild mediastinal lymphadenopathy including 13 mm short
axis AP window lymph node on 32/4 and 14 mm short axis subcarinal
lymph node on 46/4. Prominent lymphoid tissue seen in both hilar
regions. The esophagus has normal imaging features. There is no
axillary lymphadenopathy.

Lungs/Pleura: 5 mm right lower lobe pulmonary nodule identified on
image 80/series 6. Subsegmental atelectasis seen in the lung bases
bilaterally. 16 mm nodular opacity in the deep posterior left
costophrenic sulcus is probably atelectatic. Tiny bilateral pleural
effusions evident.

Upper Abdomen: Unremarkable.

Musculoskeletal: No worrisome lytic or sclerotic osseous
abnormality.

Review of the MIP images confirms the above findings.
IMPRESSION: 1. No CT evidence for acute pulmonary embolus.
2. 5 mm right lower lobe pulmonary nodule with 16 mm nodular opacity
in the deep posterior left costophrenic sulcus. Follow-up CT chest
in 3 months is recommended to ensure resolution.
3. Tiny bilateral pleural effusions with bibasilar subsegmental
atelectasis.
4. Mild lymphadenopathy in the mediastinum. Attention on follow-up 3
month CT recommended.
5. 2.1 cm left thyroid nodule. Recommend thyroid US (ref: [HOSPITAL]. [DATE]): 143-50).
6. Aortic Atherosclerosis ([id number]).

## 2022-06-18 ENCOUNTER — Ambulatory Visit: Payer: Medicare Other | Admitting: Nurse Practitioner

## 2022-06-18 ENCOUNTER — Encounter: Payer: Self-pay | Admitting: Nurse Practitioner

## 2022-06-18 VITALS — BP 138/88 | HR 81 | Ht 72.0 in | Wt 286.8 lb

## 2022-06-18 DIAGNOSIS — N182 Chronic kidney disease, stage 2 (mild): Secondary | ICD-10-CM

## 2022-06-18 DIAGNOSIS — E1122 Type 2 diabetes mellitus with diabetic chronic kidney disease: Secondary | ICD-10-CM

## 2022-06-18 DIAGNOSIS — E782 Mixed hyperlipidemia: Secondary | ICD-10-CM

## 2022-06-18 DIAGNOSIS — Z794 Long term (current) use of insulin: Secondary | ICD-10-CM

## 2022-06-18 DIAGNOSIS — I10 Essential (primary) hypertension: Secondary | ICD-10-CM

## 2022-06-18 NOTE — Progress Notes (Signed)
Endocrinology Follow Up Note       06/18/2022, 11:32 AM   Subjective:    Patient ID: [name], male    DOB: [birth year].  [name] is being seen in follow up after being seen in consultation for management of currently uncontrolled symptomatic diabetes requested by  [name], DO.   Past Medical History:  Diagnosis Date   Anxiety    Arthritis    Diabetes mellitus without complication (HCC)    GERD (gastroesophageal reflux disease)    Hypercholesteremia    Hypertension     Past Surgical History:  Procedure Laterality Date   boils     removed form back of skull   CATARACT EXTRACTION W/PHACO Right 04/02/2013   Procedure: CATARACT EXTRACTION PHACO AND INTRAOCULAR LENS PLACEMENT ([place]);  Surgeon: Williams Che, MD;  Location: [facility];  Service: Ophthalmology;  Laterality: Right;  CDE 7.00   CATARACT EXTRACTION W/PHACO Left 10/04/2013   Procedure: CATARACT EXTRACTION PHACO AND INTRAOCULAR LENS PLACEMENT ([facility]);  Surgeon: Williams Che, MD;  Location: [facility];  Service: Ophthalmology;  Laterality: Left;  CDE:1.46   COLONOSCOPY N/A 07/03/2015   Procedure: COLONOSCOPY;  Surgeon: Rogene Houston, MD;  Location: [facility];  Service: Endoscopy;  Laterality: N/A;  830   EYE SURGERY Right    "valve replaced and stent placed" per pt to reroute the fluid in his eye   ROTATOR CUFF REPAIR Right     Social History   Socioeconomic History   Marital status: Divorced    Spouse name: Not on file   Number of children: Not on file   Years of education: Not on file   Highest education level: Not on file  Occupational History   Not on file  Tobacco Use   Smoking status: Former    Packs/day: 0.50    Years: 41.00    Total pack years: 20.50    Types: Cigarettes    Quit date: 02/29/2012    Years since quitting: 10.3   Smokeless tobacco: Never  Vaping Use   Vaping Use: Never used  Substance and Sexual  Activity   Alcohol use: No    Comment: quit 2012   Drug use: No    Comment: quit in 2012   Sexual activity: Yes    Birth control/protection: None  Other Topics Concern   Not on file  Social History Narrative   Not on file   Social Determinants of Health   Financial Resource Strain: Low Risk  (09/03/2021)   Overall Financial Resource Strain (CARDIA)    Difficulty of Paying Living Expenses: Not hard at all  Food Insecurity: No Food Insecurity (09/03/2021)   Hunger Vital Sign    Worried About Running Out of Food in the Last Year: Never true    [place] in the Last Year: Never true  Transportation Needs: No Transportation Needs (09/03/2021)   PRAPARE - Hydrologist (Medical): No    Lack of Transportation (Non-Medical): No  Physical Activity: Insufficiently Active (09/03/2021)   Exercise Vital Sign    Days of Exercise per Week: 3 days    Minutes of  Exercise per Session: 30 min  Stress: No Stress Concern Present (09/03/2021)   [place]    Feeling of Stress : Not at all  Social Connections: Moderately Integrated (09/03/2021)   Social Connection and Isolation Panel [NHANES]    Frequency of Communication with Friends and Family: More than three times a week    Frequency of Social Gatherings with Friends and Family: More than three times a week    Attends Religious Services: More than 4 times per year    Active Member of [employer] or Organizations: Yes    Attends Music therapist: More than 4 times per year    Marital Status: Divorced    Family History  Problem Relation Age of Onset   Diabetes Mother    Glaucoma Mother     Outpatient Encounter Medications as of 06/18/2022  Medication Sig   ALPRAZolam (XANAX) 1 MG tablet Take 0.5-1 tablets (0.5-1 mg total) by mouth 3 (three) times daily as needed for anxiety (take ONLY IF NEEDED).   amLODipine (NORVASC) 5 MG tablet Take 1  tablet (5 mg total) by mouth daily.   apixaban (ELIQUIS) 5 MG TABS tablet Take 1 tablet (5 mg total) by mouth 2 (two) times daily.   Continuous Blood Gluc Sensor (FREESTYLE LIBRE 14 DAY SENSOR) MISC Scan to check glucose at least 4 times daily. DX: E11.4   Insulin Pen Needle (B-D ULTRAFINE III SHORT PEN) 31G X 8 MM MISC Use to give insulin daily Dx E11.9   Lancets (ONETOUCH ULTRASOFT) lancets Use as instructed to monitor glucose 4 times daily (use as back up method for CGM).   lisinopril (ZESTRIL) 40 MG tablet Take 1 tablet (40 mg total) by mouth daily.   metFORMIN (GLUCOPHAGE-XR) 500 MG 24 hr tablet TAKE 2 TABLETS BY MOUTH TWICE A DAY WITH FOOD   mirtazapine (REMERON) 45 MG tablet Take 1 tablet (45 mg total) by mouth at bedtime.   omeprazole (PRILOSEC) 20 MG capsule Take 20 mg by mouth daily.   rosuvastatin (CRESTOR) 20 MG tablet TAKE 1 TABLET BY MOUTH EVERYDAY AT BEDTIME   Semaglutide,0.25 or 0.'5MG'$ /DOS, (OZEMPIC, 0.25 OR 0.5 MG/DOSE,) 2 MG/3ML SOPN Inject 0.5 mg into the skin once a week.   sildenafil (VIAGRA) 50 MG tablet Take 1 tablet (50 mg total) by mouth daily as needed for erectile dysfunction.   dapagliflozin propanediol (FARXIGA) 10 MG TABS tablet Take 1 tablet (10 mg total) by mouth daily before breakfast.   famotidine (PEPCID) 20 MG tablet Take 1 tablet (20 mg total) by mouth 2 (two) times daily.   furosemide (LASIX) 20 MG tablet Take 1 tablet (20 mg total) by mouth daily.   glucose blood (ONETOUCH VERIO) test strip Use as instructed to monitor glucose 4 times daily (use as back up method for CGM)   insulin degludec (TRESIBA FLEXTOUCH) 200 UNIT/ML FlexTouch Pen Inject 50 Units into the skin in the morning.   No facility-administered encounter medications on file as of 06/18/2022.    ALLERGIES: No Known Allergies  VACCINATION STATUS: Immunization History  Administered Date(s) Administered   Fluad Quad(high Dose 65+) 04/30/2017, 04/13/2019, 05/15/2022   Influenza Split  04/19/2014, 05/29/2020   Influenza-Unspecified 05/18/2014, 05/09/2015, 04/22/2018, 05/24/2020, 05/19/2021   Moderna SARS-COV2 Booster Vaccination 12/13/2020   Moderna Sars-Covid-2 Vaccination 09/30/2019, 10/29/2019, 06/24/2020, 12/13/2020   Pneumococcal Conjugate-13 04/23/2018   Pneumococcal Polysaccharide-23 12/28/2015   Zoster Recombinat (Shingrix) 02/12/2022   Zoster, Live 01/04/2016    Diabetes He  presents for his follow-up diabetic visit. He has type 2 diabetes mellitus. Onset time: diagnosed at approx age of 89. His disease course has been improving. There are no hypoglycemic associated symptoms. Associated symptoms include foot paresthesias and weight loss. Pertinent negatives for diabetes include no blurred vision, no fatigue, no polydipsia, no polyuria, no visual change and no weakness. There are no hypoglycemic complications. Symptoms are improving. Diabetic complications include heart disease, impotence, nephropathy and retinopathy. Risk factors for coronary artery disease include diabetes mellitus, dyslipidemia, family history, hypertension, male sex, obesity, sedentary lifestyle and tobacco exposure. Current diabetic treatment includes oral agent (dual therapy) and insulin injections (and Ozempic). He is compliant with treatment most of the time. His weight is decreasing steadily. He is following a generally healthy diet. When asked about meal planning, he reported none. He has not had a previous visit with a dietitian. He rarely participates in exercise. His home blood glucose trend is decreasing steadily. His overall blood glucose range is 140-180 mg/dl. (He presents today with his CGM showing improved, at goal glycemic profile overall.  He was not due for another A1c today, last checked at his PCP office on 10/11 was 9.2%.  Analysis of his CGM shows TIR 84%, TAR 16%, TBR 0% with a GMI of 6.9%.  He reports the Ozempic has helped him with his food portion sizes.) An ACE inhibitor/angiotensin  II receptor blocker is being taken. He does not see a podiatrist.Eye exam is current.  Hyperlipidemia This is a chronic problem. The current episode started more than 1 year ago. The problem is controlled. Recent lipid tests were reviewed and are normal. Exacerbating diseases include chronic renal disease, diabetes and obesity. Factors aggravating his hyperlipidemia include fatty foods. Current antihyperlipidemic treatment includes statins. The current treatment provides moderate improvement of lipids. Compliance problems include adherence to diet and adherence to exercise.  Risk factors for coronary artery disease include diabetes mellitus, dyslipidemia, family history, male sex, hypertension, obesity and a sedentary lifestyle.  Hypertension This is a chronic problem. The current episode started more than 1 year ago. The problem has been resolved since onset. The problem is controlled. Pertinent negatives include no blurred vision. There are no associated agents to hypertension. Risk factors for coronary artery disease include diabetes mellitus, dyslipidemia, family history, male gender, obesity, sedentary lifestyle and smoking/tobacco exposure. Past treatments include ACE inhibitors and calcium channel blockers. The current treatment provides moderate improvement. There are no compliance problems.  Hypertensive end-organ damage includes kidney disease, CAD/MI and retinopathy. Identifiable causes of hypertension include chronic renal disease.    Review of systems  Constitutional: + steadily decreasing body weight,  current Body mass index is 38.9 kg/m., no fatigue, no subjective hyperthermia, no subjective hypothermia Eyes: + blurry vision (bilateral retinopathy with recent surgery on both eyes), legally blind in right eye, no xerophthalmia ENT: no sore throat, no nodules palpated in throat, no dysphagia/odynophagia, no hoarseness Cardiovascular: no chest pain, no shortness of breath, no palpitations,  no leg swelling Respiratory: no cough, no shortness of breath Gastrointestinal: no nausea/vomiting/diarrhea Musculoskeletal: no muscle/joint aches, walks with cane for deconditioning Skin: no rashes, no hyperemia Neurological: no tremors, no numbness, no tingling, no dizziness Psychiatric: no depression, no anxiety   Objective:     BP 138/88 (BP Location: Right Arm, Patient Position: Sitting, Cuff Size: Normal)   Pulse 81   Ht 6' (1.829 m)   Wt 286 lb 12.8 oz (130.1 kg)   BMI 38.90 kg/m   Wt Readings from Last  3 Encounters:  06/18/22 286 lb 12.8 oz (130.1 kg)  05/22/22 290 lb 12.8 oz (131.9 kg)  05/15/22 287 lb 12.8 oz (130.5 kg)     BP Readings from Last 3 Encounters:  06/18/22 138/88  05/22/22 118/64  05/15/22 120/72     Physical Exam- Limited  Constitutional:  Body mass index is 38.9 kg/m. , not in acute distress, normal state of mind Eyes:  EOMI, no exophthalmos, right eye opaque conjunctiva Neck: Supple Cardiovascular: RRR, no murmurs, rubs, or gallops, no edema Respiratory: Adequate breathing efforts, no crackles, rales, rhonchi, or wheezing Musculoskeletal: no gross deformities, strength intact in all four extremities, no gross restriction of joint movements, walks with cane for deconditioning Skin:  no rashes, no hyperemia Neurological: no tremor with outstretched hands    CMP ( most recent) CMP     Component Value Date/Time   NA 145 (H) 11/23/2021 0933   K 5.0 11/23/2021 0933   CL 106 11/23/2021 0933   CO2 18 (L) 11/23/2021 0933   GLUCOSE 139 (H) 11/23/2021 0933   GLUCOSE 164 (H) 11/01/2021 0536   BUN 21 11/23/2021 0933   CREATININE 0.93 11/23/2021 0933   CALCIUM 9.8 11/23/2021 0933   PROT 8.2 (H) 10/29/2021 0810   PROT 7.4 06/11/2021 0958   ALBUMIN 3.7 10/29/2021 0810   ALBUMIN 4.1 06/11/2021 0958   AST 24 10/29/2021 0810   ALT 17 10/29/2021 0810   ALKPHOS 87 10/29/2021 0810   BILITOT 0.8 10/29/2021 0810   BILITOT 0.7 06/11/2021 0958    GFRNONAA >60 11/01/2021 0536   GFRAA 92 04/13/2020 0906     Diabetic Labs (most recent): Lab Results  Component Value Date   HGBA1C 9.2 (H) 05/15/2022   HGBA1C 7.7 (H) 02/12/2022   HGBA1C 8.1 (H) 10/29/2021   MICROALBUR 150 12/05/2020     Lipid Panel ( most recent) Lipid Panel     Component Value Date/Time   CHOL 103 06/11/2021 0958   TRIG 98 06/11/2021 0958   HDL 27 (L) 06/11/2021 0958   CHOLHDL 3.8 06/11/2021 0958   LDLCALC 57 06/11/2021 0958   LABVLDL 19 06/11/2021 0958      Lab Results  Component Value Date   TSH 0.663 10/29/2021   TSH 0.280 (L) 07/05/2019   TSH 0.124 (L) 04/28/2019   TSH 0.059 (L) 04/06/2019           Assessment & Plan:   1) Uncontrolled Type 2 Diabetes with long-term use of insulin  - [name] has currently uncontrolled symptomatic type 2 DM since 71 years of age.   He presents today with his CGM showing improved, at goal glycemic profile overall.  He was not due for another A1c today, last checked at his PCP office on 10/11 was 9.2%.  Analysis of his CGM shows TIR 84%, TAR 16%, TBR 0% with a GMI of 6.9%.  He reports the Ozempic has helped him with his food portion sizes.  -Recent labs reviewed.  - I had a long discussion with him about the progressive nature of diabetes and the pathology behind its complications. -his diabetes is complicated by CKD, CAD and he remains at a high risk for more acute and chronic complications which include CAD, CVA, CKD, retinopathy, and neuropathy. These are all discussed in detail with him.  - Nutritional counseling repeated at each appointment due to patients tendency to fall back in to old habits.  - The patient admits there is a room for improvement in their diet  and drink choices. -  Suggestion is made for the patient to avoid simple carbohydrates from their diet including Cakes, Sweet Desserts / Pastries, Ice Cream, Soda (diet and regular), Sweet Tea, Candies, Chips, Cookies, Sweet Pastries, Store  Bought Juices, Alcohol in Excess of 1-2 drinks a day, Artificial Sweeteners, Coffee Creamer, and "Sugar-free" Products. This will help patient to have stable blood glucose profile and potentially avoid unintended weight gain.   - I encouraged the patient to switch to unprocessed or minimally processed complex starch and increased protein intake (animal or plant source), fruits, and vegetables.   - Patient is advised to stick to a routine mealtimes to eat 3 meals a day and avoid unnecessary snacks (to snack only to correct hypoglycemia).  - I have approached him with the following individualized plan to manage  his diabetes and patient agrees:   -Given his improved, at goal glycemic profile overall, no changes will be made to his medications today.  He is advised to continue his Tresiba 50 units SQ nightly, Ozempic 0.5 mg SQ weekly, Metformin 500 mg ER twice daily with meals, and Farxiga 10 mg po daily.   -he is encouraged to continue using his CGM to monitor glucose 4 times daily, before meals and before bed, and to call the clinic if he has readings less than 70 or greater than 300 for 3 tests in a row.  - he is warned not to take insulin without proper monitoring per orders. - Adjustment parameters are given to him for hypo and hyperglycemia in writing.  - Specific targets for  A1c;  LDL, HDL,  and Triglycerides were discussed with the patient.  2) Blood Pressure /Hypertension:  his blood pressure is controlled to target for his age.   he is advised to continue his current medications including Norvasc 5 mg po daily and Lisinopril 40 mg p.o. daily with breakfast.  3) Lipids/Hyperlipidemia:    Review of his recent lipid panel from 06/11/21 showed controlled LDL at 57 .  he  is advised to continue Crestor 20 mg daily at bedtime.  Side effects and precautions discussed with him.    4)  Weight/Diet:  his Body mass index is 38.9 kg/m.  -  clearly complicating his diabetes care.   he is a  candidate for weight loss. I discussed with him the fact that loss of 5 - 10% of his  current body weight will have the most impact on his diabetes management.  Exercise, and detailed carbohydrates information provided  -  detailed on discharge instructions.  5) Chronic Care/Health Maintenance: -he is on ACEI/ARB and Statin medications and is encouraged to initiate and continue to follow up with Ophthalmology, Dentist, Podiatrist at least yearly or according to recommendations, and advised to stay away from smoking. I have recommended yearly flu vaccine and pneumonia vaccine at least every 5 years; moderate intensity exercise for up to 150 minutes weekly; and sleep for at least 7 hours a day.  - he is advised to maintain close follow up with [name], DO for primary care needs, as well as his other providers for optimal and coordinated care.     I spent 30 minutes in the care of the patient today including review of labs from [place], Lipids, Thyroid Function, Hematology (current and previous including abstractions from other facilities); face-to-face time discussing  his blood glucose readings/logs, discussing hypoglycemia and hyperglycemia episodes and symptoms, medications doses, his options of short and long term treatment based on  the latest standards of care / guidelines;  discussion about incorporating lifestyle medicine;  and documenting the encounter. Risk reduction counseling performed per USPSTF guidelines to reduce obesity and cardiovascular risk factors.     Please refer to Patient Instructions for Blood Glucose Monitoring and Insulin/Medications Dosing Guide"  in media tab for additional information. Please  also refer to " Patient Self Inventory" in the Media  tab for reviewed elements of pertinent patient history.  [name] participated in the discussions, expressed understanding, and voiced agreement with the above plans.  All questions were answered to his satisfaction. he  is encouraged to contact clinic should he have any questions or concerns prior to his return visit.   Follow up plan: - Return in about 4 months (around 10/17/2022) for Diabetes F/U with A1c in office, No previsit labs, Bring meter and logs.  Rayetta Pigg, [facility] [address] [place], [place] [address] Phone: [phone] Fax: [phone]  06/18/2022, 11:32 AM

## 2022-06-21 ENCOUNTER — Telehealth: Payer: Medicare Other

## 2022-06-21 ENCOUNTER — Telehealth: Payer: Self-pay | Admitting: Pharmacist

## 2022-06-21 NOTE — Telephone Encounter (Signed)
If not already, please send re-enrollment for: Novo : ozempic0.'5mg'$  sq weekly, tresiba u-200 50 units daily Cancel novolog if still active [place]-farxiga '10mg'$  daily  Thank you!!

## 2022-06-26 ENCOUNTER — Telehealth: Payer: Self-pay

## 2022-06-26 NOTE — Telephone Encounter (Signed)
Attempted to contact patient about patient assistance delivery of needles. Supplies left up front. No answer with patient and VM full

## 2022-07-01 NOTE — Telephone Encounter (Signed)
Patient aware.

## 2022-07-04 DIAGNOSIS — E1140 Type 2 diabetes mellitus with diabetic neuropathy, unspecified: Secondary | ICD-10-CM | POA: Diagnosis not present

## 2022-07-04 DIAGNOSIS — Z794 Long term (current) use of insulin: Secondary | ICD-10-CM | POA: Diagnosis not present

## 2022-07-24 ENCOUNTER — Telehealth: Payer: Self-pay

## 2022-07-24 NOTE — Telephone Encounter (Signed)
Patient advised we have received his patient assistance medication,Ozempic, and it is being placed in the refrigerator for him to pick up.

## 2022-08-16 ENCOUNTER — Emergency Department (HOSPITAL_COMMUNITY): Payer: Medicare Other

## 2022-08-16 ENCOUNTER — Ambulatory Visit: Payer: Medicare Other | Admitting: Family Medicine

## 2022-08-16 ENCOUNTER — Emergency Department (HOSPITAL_COMMUNITY)
Admission: EM | Admit: 2022-08-16 | Discharge: 2022-08-16 | Disposition: A | Discharge status: Home / Self Care | Payer: Medicare Other | Attending: Emergency Medicine | Admitting: Emergency Medicine

## 2022-08-16 ENCOUNTER — Other Ambulatory Visit: Payer: Self-pay

## 2022-08-16 ENCOUNTER — Telehealth: Payer: Self-pay | Admitting: Pharmacist

## 2022-08-16 DIAGNOSIS — Z7984 Long term (current) use of oral hypoglycemic drugs: Secondary | ICD-10-CM | POA: Insufficient documentation

## 2022-08-16 DIAGNOSIS — Z79899 Other long term (current) drug therapy: Secondary | ICD-10-CM | POA: Insufficient documentation

## 2022-08-16 DIAGNOSIS — I6782 Cerebral ischemia: Secondary | ICD-10-CM | POA: Diagnosis not present

## 2022-08-16 DIAGNOSIS — R531 Weakness: Secondary | ICD-10-CM | POA: Diagnosis not present

## 2022-08-16 DIAGNOSIS — E1165 Type 2 diabetes mellitus with hyperglycemia: Secondary | ICD-10-CM | POA: Insufficient documentation

## 2022-08-16 DIAGNOSIS — I10 Essential (primary) hypertension: Secondary | ICD-10-CM | POA: Insufficient documentation

## 2022-08-16 DIAGNOSIS — Z743 Need for continuous supervision: Secondary | ICD-10-CM | POA: Diagnosis not present

## 2022-08-16 DIAGNOSIS — Z7901 Long term (current) use of anticoagulants: Secondary | ICD-10-CM | POA: Diagnosis not present

## 2022-08-16 DIAGNOSIS — S0990XA Unspecified injury of head, initial encounter: Secondary | ICD-10-CM | POA: Diagnosis not present

## 2022-08-16 DIAGNOSIS — G319 Degenerative disease of nervous system, unspecified: Secondary | ICD-10-CM | POA: Diagnosis not present

## 2022-08-16 DIAGNOSIS — Z794 Long term (current) use of insulin: Secondary | ICD-10-CM | POA: Insufficient documentation

## 2022-08-16 DIAGNOSIS — I6529 Occlusion and stenosis of unspecified carotid artery: Secondary | ICD-10-CM | POA: Diagnosis not present

## 2022-08-16 DIAGNOSIS — R6889 Other general symptoms and signs: Secondary | ICD-10-CM | POA: Diagnosis not present

## 2022-08-16 DIAGNOSIS — R55 Syncope and collapse: Secondary | ICD-10-CM | POA: Diagnosis not present

## 2022-08-16 DIAGNOSIS — M47812 Spondylosis without myelopathy or radiculopathy, cervical region: Secondary | ICD-10-CM | POA: Diagnosis not present

## 2022-08-16 DIAGNOSIS — R0602 Shortness of breath: Secondary | ICD-10-CM | POA: Diagnosis not present

## 2022-08-16 DIAGNOSIS — R0902 Hypoxemia: Secondary | ICD-10-CM | POA: Diagnosis not present

## 2022-08-16 DIAGNOSIS — S199XXA Unspecified injury of neck, initial encounter: Secondary | ICD-10-CM | POA: Diagnosis not present

## 2022-08-16 DIAGNOSIS — R404 Transient alteration of awareness: Secondary | ICD-10-CM | POA: Diagnosis not present

## 2022-08-16 LAB — CBC WITH DIFFERENTIAL/PLATELET
Abs Immature Granulocytes: 0.01 10*3/uL (ref 0.00–0.07)
Basophils Absolute: 0 10*3/uL (ref 0.0–0.1)
Basophils Relative: 1 %
Eosinophils Absolute: 0.1 10*3/uL (ref 0.0–0.5)
Eosinophils Relative: 2 %
HCT: 49.6 % (ref 39.0–52.0)
Hemoglobin: 15.2 g/dL (ref 13.0–17.0)
Immature Granulocytes: 0 %
Lymphocytes Relative: 28 %
Lymphs Abs: 1.3 10*3/uL (ref 0.7–4.0)
MCH: 25.6 pg — ABNORMAL LOW (ref 26.0–34.0)
MCHC: 30.6 g/dL (ref 30.0–36.0)
MCV: 83.5 fL (ref 80.0–100.0)
Monocytes Absolute: 0.4 10*3/uL (ref 0.1–1.0)
Monocytes Relative: 9 %
Neutro Abs: 2.8 10*3/uL (ref 1.7–7.7)
Neutrophils Relative %: 60 %
Platelets: 226 10*3/uL (ref 150–400)
RBC: 5.94 MIL/uL — ABNORMAL HIGH (ref 4.22–5.81)
RDW: 18.9 % — ABNORMAL HIGH (ref 11.5–15.5)
WBC: 4.6 10*3/uL (ref 4.0–10.5)
nRBC: 0 % (ref 0.0–0.2)

## 2022-08-16 LAB — COMPREHENSIVE METABOLIC PANEL
ALT: 20 U/L (ref 0–44)
AST: 26 U/L (ref 15–41)
Albumin: 3.7 g/dL (ref 3.5–5.0)
Alkaline Phosphatase: 82 U/L (ref 38–126)
Anion gap: 11 (ref 5–15)
BUN: 13 mg/dL (ref 8–23)
CO2: 26 mmol/L (ref 22–32)
Calcium: 9 mg/dL (ref 8.9–10.3)
Chloride: 102 mmol/L (ref 98–111)
Creatinine, Ser: 0.94 mg/dL (ref 0.61–1.24)
GFR, Estimated: >60 mL/min (ref >60)
Glucose, Bld: 152 mg/dL — ABNORMAL HIGH (ref 70–99)
Potassium: 3.6 mmol/L (ref 3.5–5.1)
Sodium: 139 mmol/L (ref 135–145)
Total Bilirubin: 0.7 mg/dL (ref 0.3–1.2)
Total Protein: 8.4 g/dL — ABNORMAL HIGH (ref 6.5–8.1)

## 2022-08-16 LAB — URINALYSIS, ROUTINE W REFLEX MICROSCOPIC
Bacteria, UA: NONE SEEN
Bilirubin Urine: NEGATIVE
Glucose, UA: >=500 mg/dL — AB
Ketones, ur: NEGATIVE mg/dL
Leukocytes,Ua: NEGATIVE
Nitrite: NEGATIVE
Protein, ur: 30 mg/dL — AB
Specific Gravity, Urine: 1.023 (ref 1.005–1.030)
pH: 6 (ref 5.0–8.0)

## 2022-08-16 LAB — CBG MONITORING, ED: Glucose-Capillary: 147 mg/dL — ABNORMAL HIGH (ref 70–99)

## 2022-08-16 LAB — TSH: TSH: 0.781 u[IU]/mL (ref 0.350–4.500)

## 2022-08-16 LAB — TROPONIN I (HIGH SENSITIVITY)
Troponin I (High Sensitivity): 10 ng/L (ref <18)
Troponin I (High Sensitivity): 9 ng/L (ref <18)

## 2022-08-16 LAB — MAGNESIUM: Magnesium: 1.8 mg/dL (ref 1.7–2.4)

## 2022-08-16 MED ORDER — SODIUM CHLORIDE 0.9 % IV BOLUS
500.0000 mL | Freq: Once | INTRAVENOUS | Status: AC
  Administered 2022-08-16: 500 mL via INTRAVENOUS

## 2022-08-16 NOTE — ED Notes (Signed)
Pt tolerated walking through the hallway well.  Ambulates with cane. O2 saturation 98% and pulse of 81.

## 2022-08-16 NOTE — ED Provider Notes (Signed)
[facility] EMERGENCY DEPARTMENT Provider Note   CSN: [id number] Arrival date & time: 08/16/22  1001     History  Chief Complaint  Patient presents with   Weakness    [name] is a 72 y.o. male.   Weakness    Patient with medical history of atrial fibrillation on Eliquis, hypertension, diabetes, GERD, hyperlipidemia presents to the emergency department due to weakness and syncope.  Patient states almost a week ago on Sunday he felt lightheaded while getting out of bed to use the bathroom.  He lost consciousness and hit the back of his head.  He was not seen in the hospital despite being on Eliquis, since then he states he has been feeling lightheaded when he gets from sitting to standing.  He also reports pain to the left thigh which comes and goes and is worse with any movement.  No lateralized weakness or numbness, he is not having any vision changes but is blind in the right eye.  Denies any recent changes in his medications.  No nausea, vomiting, dysarthria, chest pain.  Not short of breath.  Denies any prodromal symptoms prior to the lightheadedness/weakness when he tries to stand upright.  Denies any change in oral intake.  Home Medications Prior to Admission medications   Medication Sig Start Date End Date Taking? Authorizing Provider  ALPRAZolam (XANAX) 1 MG tablet Take 0.5-1 tablets (0.5-1 mg total) by mouth 3 (three) times daily as needed for anxiety (take ONLY IF NEEDED). 05/15/22   [name], DO  amLODipine (NORVASC) 5 MG tablet Take 1 tablet (5 mg total) by mouth daily. 02/12/22   [name], DO  apixaban (ELIQUIS) 5 MG TABS tablet Take 1 tablet (5 mg total) by mouth 2 (two) times daily. 11/23/21 11/18/22  Buford Dresser, MD  Continuous Blood Gluc Sensor (FREESTYLE LIBRE 14 DAY SENSOR) MISC Scan to check glucose at least 4 times daily. DX: E11.4 06/21/21   [name], DO  dapagliflozin propanediol (FARXIGA) 10 MG TABS tablet Take 1  tablet (10 mg total) by mouth daily before breakfast. 12/18/21   Ronnie Doss M, DO  famotidine (PEPCID) 20 MG tablet Take 1 tablet (20 mg total) by mouth 2 (two) times daily. 02/12/22   [name], DO  furosemide (LASIX) 20 MG tablet Take 1 tablet (20 mg total) by mouth daily. 11/23/21 11/23/22  Buford Dresser, MD  glucose blood (ONETOUCH VERIO) test strip Use as instructed to monitor glucose 4 times daily (use as back up method for CGM) 04/10/21   Reardon, Juanetta Beets, NP  insulin degludec (TRESIBA FLEXTOUCH) 200 UNIT/ML FlexTouch Pen Inject 50 Units into the skin in the morning. 03/18/22   Brita Romp, NP  Insulin Pen Needle (B-D ULTRAFINE III SHORT PEN) 31G X 8 MM MISC Use to give insulin daily Dx E11.9 06/09/19   Baruch Gouty, FNP  Lancets [facility] ULTRASOFT) lancets Use as instructed to monitor glucose 4 times daily (use as back up method for CGM). 04/10/21   Brita Romp, NP  lisinopril (ZESTRIL) 40 MG tablet Take 1 tablet (40 mg total) by mouth daily. 02/12/22   Ronnie Doss M, DO  metFORMIN (GLUCOPHAGE-XR) 500 MG 24 hr tablet TAKE 2 TABLETS BY MOUTH TWICE A DAY WITH FOOD 02/12/22   Ronnie Doss M, DO  mirtazapine (REMERON) 45 MG tablet Take 1 tablet (45 mg total) by mouth at bedtime. 02/12/22   [name], DO  omeprazole (PRILOSEC) 20 MG capsule  Take 20 mg by mouth daily.    [provider]  rosuvastatin (CRESTOR) 20 MG tablet TAKE 1 TABLET BY MOUTH EVERYDAY AT BEDTIME 02/12/22   Ronnie Doss M, DO  Semaglutide,0.25 or 0.'5MG'$ /DOS, (OZEMPIC, 0.25 OR 0.5 MG/DOSE,) 2 MG/3ML SOPN Inject 0.5 mg into the skin once a week.    [provider]  sildenafil (VIAGRA) 50 MG tablet Take 1 tablet (50 mg total) by mouth daily as needed for erectile dysfunction. 08/16/20   [name], DO      Allergies    Patient has no known allergies.    Review of Systems   Review of Systems  Neurological:  Positive for weakness.    Physical  Exam Updated Vital Signs BP (!) 145/72   Pulse 79   Temp 98 F (36.7 C) (Oral)   Resp 18   Ht 6' (1.829 m)   Wt 129.7 kg   SpO2 95%   BMI 38.79 kg/m  Physical Exam Vitals and nursing note reviewed. Exam conducted with a chaperone present.  Constitutional:      Appearance: Normal appearance.  HENT:     Head: Normocephalic and atraumatic.  Eyes:     General: No scleral icterus.       Right eye: No discharge.        Left eye: No discharge.     Extraocular Movements: Extraocular movements intact.     Pupils: Pupils are equal, round, and reactive to light.     Comments: Right eye blind. EOMI   Cardiovascular:     Rate and Rhythm: Normal rate. Rhythm irregular.     Pulses: Normal pulses.     Heart sounds: Normal heart sounds.     No friction rub. No gallop.  Pulmonary:     Effort: Pulmonary effort is normal. No respiratory distress.     Breath sounds: Normal breath sounds.  Abdominal:     General: Abdomen is flat. Bowel sounds are normal. There is no distension.     Palpations: Abdomen is soft.     Tenderness: There is no abdominal tenderness.  Musculoskeletal:        General: No tenderness.     Cervical back: Normal range of motion.     Comments: No calf tenderness.  Skin:    General: Skin is warm and dry.     Coloration: Skin is not jaundiced.  Neurological:     Mental Status: He is alert. Mental status is at baseline.     Coordination: Coordination normal.     Comments: Cranial nerves II through XII are grossly intact.  Upper extremity strength is symmetric bilaterally, normal finger-nose.  Able to raise both lower extremities against gravity and against resistance.  No sensation deficit to upper or lower extremities.  Plantarflexion dorsiflexion 5/5 against resistance.     ED Results / Procedures / Treatments   Labs (all labs ordered are listed, but only abnormal results are displayed) Labs Reviewed  COMPREHENSIVE METABOLIC PANEL - Abnormal; Notable for the  following components:      Result Value   Glucose, Bld 152 (*)    Total Protein 8.4 (*)    All other components within normal limits  CBC WITH DIFFERENTIAL/PLATELET - Abnormal; Notable for the following components:   RBC 5.94 (*)    MCH 25.6 (*)    RDW 18.9 (*)    All other components within normal limits  URINALYSIS, ROUTINE W REFLEX MICROSCOPIC - Abnormal; Notable for the following components:  Color, Urine AMBER (*)    Glucose, UA >=500 (*)    Hgb urine dipstick SMALL (*)    Protein, ur 30 (*)    All other components within normal limits  CBG MONITORING, ED - Abnormal; Notable for the following components:   Glucose-Capillary 147 (*)    All other components within normal limits  TSH  MAGNESIUM  TROPONIN I (HIGH SENSITIVITY)  TROPONIN I (HIGH SENSITIVITY)    EKG None  Radiology CT Head Wo Contrast  Result Date: 08/16/2022 CLINICAL DATA:  72 year old male with head and neck injury. EXAM: CT HEAD WITHOUT CONTRAST CT CERVICAL SPINE WITHOUT CONTRAST TECHNIQUE: Multidetector CT imaging of the head and cervical spine was performed following the standard protocol without intravenous contrast. Multiplanar CT image reconstructions of the cervical spine were also generated. RADIATION DOSE REDUCTION: This exam was performed according to the departmental dose-optimization program which includes automated exposure control, adjustment of the mA and/or kV according to patient size and/or use of iterative reconstruction technique. COMPARISON:  10/02/2008 head CT FINDINGS: CT HEAD FINDINGS Brain: No evidence of acute infarction, hemorrhage, hydrocephalus, extra-axial collection or mass lesion/mass effect. Atrophy and chronic small-vessel white matter ischemic changes again noted. Vascular: Carotid atherosclerotic calcifications are noted. Skull: No acute abnormality Sinuses/Orbits: No acute abnormality Other: No acute abnormality CT CERVICAL SPINE FINDINGS Alignment: Straightening of the normal  cervical lordosis is noted without subluxation. Skull base and vertebrae: No acute fracture. No primary bone lesion or focal pathologic process. Soft tissues and spinal canal: No prevertebral fluid or swelling. No visible canal hematoma. Disc levels: Mild multilevel degenerative disc disease/spondylosis identified contributing to mild central spinal and moderate foraminal narrowing at C6-7. Upper chest: No acute abnormality Other: None IMPRESSION: 1. No evidence of acute intracranial abnormality. Atrophy and chronic small-vessel white matter ischemic changes. 2. No static evidence of acute injury to the cervical spine. 3. Degenerative changes of the cervical spine as described. Electronically Signed   By: Margarette Canada M.D.   On: 08/16/2022 11:15   CT Cervical Spine Wo Contrast  Result Date: 08/16/2022 CLINICAL DATA:  72 year old male with head and neck injury. EXAM: CT HEAD WITHOUT CONTRAST CT CERVICAL SPINE WITHOUT CONTRAST TECHNIQUE: Multidetector CT imaging of the head and cervical spine was performed following the standard protocol without intravenous contrast. Multiplanar CT image reconstructions of the cervical spine were also generated. RADIATION DOSE REDUCTION: This exam was performed according to the departmental dose-optimization program which includes automated exposure control, adjustment of the mA and/or kV according to patient size and/or use of iterative reconstruction technique. COMPARISON:  10/02/2008 head CT FINDINGS: CT HEAD FINDINGS Brain: No evidence of acute infarction, hemorrhage, hydrocephalus, extra-axial collection or mass lesion/mass effect. Atrophy and chronic small-vessel white matter ischemic changes again noted. Vascular: Carotid atherosclerotic calcifications are noted. Skull: No acute abnormality Sinuses/Orbits: No acute abnormality Other: No acute abnormality CT CERVICAL SPINE FINDINGS Alignment: Straightening of the normal cervical lordosis is noted without subluxation. Skull  base and vertebrae: No acute fracture. No primary bone lesion or focal pathologic process. Soft tissues and spinal canal: No prevertebral fluid or swelling. No visible canal hematoma. Disc levels: Mild multilevel degenerative disc disease/spondylosis identified contributing to mild central spinal and moderate foraminal narrowing at C6-7. Upper chest: No acute abnormality Other: None IMPRESSION: 1. No evidence of acute intracranial abnormality. Atrophy and chronic small-vessel white matter ischemic changes. 2. No static evidence of acute injury to the cervical spine. 3. Degenerative changes of the cervical spine as described. Electronically Signed  By: Margarette Canada M.D.   On: 08/16/2022 11:15   DG Chest 1 View  Result Date: 08/16/2022 CLINICAL DATA:  Shortness of breath EXAM: CHEST  1 VIEW COMPARISON:  10/29/2021 FINDINGS: The heart size and mediastinal contours are within normal limits. Both lungs are clear. The visualized skeletal structures are unremarkable. IMPRESSION: No active disease. Electronically Signed   By: Davina Poke D.O.   On: 08/16/2022 11:13    Procedures Procedures    Medications Ordered in ED Medications  sodium chloride 0.9 % bolus 500 mL (0 mLs Intravenous Stopped 08/16/22 1246)  sodium chloride 0.9 % bolus 500 mL (0 mLs Intravenous Stopped 08/16/22 1537)    ED Course/ Medical Decision Making/ A&P Clinical Course as of 08/16/22 1818  Fri Aug 16, 2022  1054 CBC with Differential(!) No leukocytosis [HS]  1054 POC CBG, ED(!) hyperglycemic [HS]  1054 ED EKG Patient is on cardiac monitoring in atrial fibrillation per my interpretation.  EKG also shows atrial fibrillation with a borderline QT prolongation. [HS]  1694 I reevaluated the patient once more.  He states he feels significantly improved after fluid bolus.  We discussed laboratory workup and imaging studies which have been reassuring thus far.  Will attempt ambulation [HS]  1506 Comprehensive metabolic panel(!) No  gross electrolyte derangement or AKI.  Mildly hyperglycemic at 152. [HS]  1507 Troponin I (High Sensitivity) Negative delta troponin [HS]  1507 TSH Within normal limits [HS]  1507 Magnesium WNL [HS]  1507 DG Chest 1 View Neg  [HS]  1507 CT Cervical Spine Wo Contrast Neg  [HS]  1507 CT Head Wo Contrast Neg  [HS]  1511 Urinalysis, Routine w reflex microscopic Urine, Clean Catch(!) No underlying UTI, glucosuria [HS]    Clinical Course User Index [HS] Sherrill Raring, PA-C                           Medical Decision Making Amount and/or Complexity of Data Reviewed Labs: ordered. Decision-making details documented in ED Course. Radiology: ordered. Decision-making details documented in ED Course. ECG/medicine tests:  Decision-making details documented in ED Course.   This is a 72 year old male presenting to the emergency department due to weakness.  Differential includes but not limited to ACS, pneumonia, UTI, metabolic abnormality, AKI, dehydration, orthostatic hypotension, kidney/CVA, traumatic head injury.   On exam patient has no appreciable focal deficits.  Upper and lower extremity pulses are symmetric bilaterally.  He is overall well-appearing, during initial triage orthostatics were performed and he was hypotensive.   He has no signs of traumatic head injury on exam but given he did have a fall and hit his head and he is on Eliquis he we will get imaging of his head and neck, this happened a week ago so lower suspicion for hemorrhaging. Laboratory workup also initiated.   I ordered, viewed and interpreted laboratory workup as documented ED course.  Workup is overall reassuring without any underlying signs of infection, TSH within normal limits, negative delta troponin making ACS very unlikely, there is no gross electrolyte abnormalities or AKI.  No leukocytosis or new anemia.  I ordered, viewed and interpreted imaging studies as documented ED course.  No traumatic findings.  No signs  of underlying pneumonia.  EKG shows atrial fibrillation which is baseline per patient.  Patient is on atrial fibrillation on cardiac monitoring.  I have reevaluated the patient and after liter fluid bolus.  I viewed external medical records, his EF was 8  last year.   I reevaluated the patient after fluids as documented ED course, he feels significantly improved.  Will attempt to ambulate the patient.  If he is able to ambulate I do think it is reasonable to send him home given I think the symptoms are most likely secondary due to orthostatic hypotension given reassuring workup and orthostasis on exam.  Patient is ambulatory with steady gait.  I engaged in shared  decision-making with the patient and daughter who would prefer to go home rather than admission for observation.  I did consider admitting the patient due to syncopal episode that happened a week ago, he also does not wish to come into the hospital.  I discussed with my attending Dr. Roderic Palau who is in agreement that patient is stable for outpatient follow-up.  I discussed return precautions with the patient who verbalized understanding and agreement the plan.          Final Clinical Impression(s) / ED Diagnoses Final diagnoses:  Weakness    Rx / DC Orders ED Discharge Orders     None         Sherrill Raring, Hershal Coria 08/16/22 1818    Milton Ferguson, MD 08/17/22 [phone]

## 2022-08-16 NOTE — ED Triage Notes (Signed)
Pt bib ems c/o tingling and left leg pain intermittently in the morning x 3 weeks, weakness with ambulation and syncope last Sunday. H/O Arthritis, Afib.  178/110 HR 74 CBG 178

## 2022-08-16 NOTE — Telephone Encounter (Signed)
  Pharmacy Outreach    08/16/2022 Name: ZURI BRADWAY MRN: [id number] DOB: 17-Jul-1951   Referred by: [name], DO Reason for referral : Hypertension  Attempted to outreach patient re: HTN, T2DM He is currently in the emergency room Will follow up with patient next week     Regina Eck, PharmD, BCPS, [place] Clinical Pharmacist, [place]  II  T [phone]

## 2022-08-16 NOTE — Discharge Instructions (Signed)
You are seen today in the emergency department for weakness.  I suspect it is due to orthostatic changes in your blood pressure which means her blood pressure drops when you go from sitting to standing.  Drink plenty of fluids, follow-up with your primary doctor on Monday for reevaluation.  If you have another episode where you feel like you are going to lose consciousness, you have weakness one-sided your body, chest pain or shortness of breath return back to the ED for additional evaluation.

## 2022-08-19 ENCOUNTER — Encounter: Payer: Self-pay | Admitting: *Deleted

## 2022-08-19 ENCOUNTER — Telehealth: Payer: Self-pay | Admitting: *Deleted

## 2022-08-19 NOTE — Patient Outreach (Signed)
  Care Coordination TOC Note EMMI Alert Transition Care Management Follow-up Telephone Call Date of discharge and from where: [name] 08[facility]16/22 How have you been since you were released from the hospital? "I'm doing good now. I went to the store and walked around and feel ok." Any questions or concerns? No  Items Reviewed: Did the pt receive and understand the discharge instructions provided? Yes  Medications obtained and verified? Yes  Other? Yes . Fall precautions discussed. Advised to notify provider of any falls and to seek appropriate medical attention after any falls, especially if he hit hits his head or there is a LOC. Discussed medication cost. Eliquis was $91 this month. Usually $45. Inquired about Medicare Extra Help and he said that he did apply and received a letter a couple weeks ago stating that he qualified. Provided education on Medicare Extra Help and that if he has 100% coverage, then he shouldn't have a medicare coverage gap and that his meds should be around $10 for name brand and $4 for generic. Any new allergies since your discharge? No  Dietary orders reviewed? Yes Do you have support at home? Yes   Home Care and Equipment/Supplies: Were home health services ordered? no If so, what is the name of the agency?   Has the agency set up a time to come to the patient's home? not applicable Were any new equipment or medical supplies ordered?  No What is the name of the medical supply agency?  Were you able to get the supplies/equipment? not applicable Do you have any questions related to the use of the equipment or supplies? No  Functional Questionnaire: (I = Independent and D = Dependent) ADLs: I  Bathing/Dressing- I  Meal Prep- I  Eating- I  Maintaining continence- I  Transferring/Ambulation- I  Managing Meds- I  Follow up appointments reviewed:  PCP Hospital f/u appt confirmed? Yes  Scheduled to see [facility] DOD on 08/27/22 @ 11:05. [facility] f/u  appt confirmed?  Not indicated   Are transportation arrangements needed? No  If their condition worsens, is the pt aware to call PCP or go to the Emergency Dept.? Yes Was the patient provided with contact information for the PCP's office or ED? Yes Was to pt encouraged to call back with questions or concerns? Yes  SDOH assessments and interventions completed:   Yes SDOH Interventions Today    Flowsheet Row Most Recent Value  SDOH Interventions   Transportation Interventions Intervention Not Indicated  Financial Strain Interventions Intervention Not Indicated       Care Coordination Interventions:  Interventions outlined above    Encounter Outcome:  Pt. Visit Completed    Chong Sicilian, BSN, RN-BC RN Care Coordinator [place]: [phone] Main #: [phone]

## 2022-08-25 ENCOUNTER — Encounter (HOSPITAL_BASED_OUTPATIENT_CLINIC_OR_DEPARTMENT_OTHER): Payer: Self-pay | Admitting: Cardiology

## 2022-08-27 ENCOUNTER — Telehealth: Payer: Self-pay | Admitting: Pharmacist

## 2022-08-27 ENCOUNTER — Encounter: Payer: Self-pay | Admitting: Family Medicine

## 2022-08-27 ENCOUNTER — Ambulatory Visit (INDEPENDENT_AMBULATORY_CARE_PROVIDER_SITE_OTHER): Payer: Medicare Other | Admitting: Family Medicine

## 2022-08-27 VITALS — BP 135/77 | HR 74 | Temp 98.8°F | Ht 72.0 in | Wt 282.0 lb

## 2022-08-27 DIAGNOSIS — I951 Orthostatic hypotension: Secondary | ICD-10-CM

## 2022-08-27 DIAGNOSIS — I152 Hypertension secondary to endocrine disorders: Secondary | ICD-10-CM | POA: Diagnosis not present

## 2022-08-27 DIAGNOSIS — E1159 Type 2 diabetes mellitus with other circulatory complications: Secondary | ICD-10-CM

## 2022-08-27 DIAGNOSIS — E1140 Type 2 diabetes mellitus with diabetic neuropathy, unspecified: Secondary | ICD-10-CM | POA: Diagnosis not present

## 2022-08-27 DIAGNOSIS — I480 Paroxysmal atrial fibrillation: Secondary | ICD-10-CM | POA: Diagnosis not present

## 2022-08-27 DIAGNOSIS — Z794 Long term (current) use of insulin: Secondary | ICD-10-CM

## 2022-08-27 LAB — BAYER DCA HB A1C WAIVED: HB A1C (BAYER DCA - WAIVED): 9.1 % — ABNORMAL HIGH (ref 4.8–5.6)

## 2022-08-27 MED ORDER — DAPAGLIFLOZIN PROPANEDIOL 5 MG PO TABS: 5.0000 mg | ORAL_TABLET | Freq: Every day | ORAL | Status: DC

## 2022-08-27 NOTE — Telephone Encounter (Signed)
Can you send dose change forms for: Ozempic '1mg'$  weekly Farxiga '5mg'$  daily  These will be the new dosing Thank you!

## 2022-08-27 NOTE — Patient Instructions (Signed)
CUT Farxiga IN HALF.  TAKE ONLY 1/2 tablet daily. Monitor sugars See [facility] in 2-3 weeks for sugar review, sooner if they are consistently above 200. We may increase Ozempic to '1mg'$  for sugar control.

## 2022-08-27 NOTE — Progress Notes (Signed)
I have separately seen and examined the patient. I have discussed the findings and exam with student [name] and agree with the below note.  My changes/additions are outlined in BLUE.    S: Patient here for hospital follow-up after he had a fall.  This apparently was due to orthostatic hypotension.  This has been something he has struggled with in the past.  Currently treated with Wilder Glade, Ozempic for type 2 diabetes.  He reports quite a bit of urine output.  No recurrent falls  O: Vitals:   08/27/22 1036  BP: 135/77  Pulse: 74  Temp: 98.8 F (37.1 C)  SpO2: 98%    Gen: Nontoxic male no acute distress Cardio: Regular rate and rhythm.  S1-S2 heard. Pulm: Normal work of breathing on room air. MSK: Requires cane for ambulation.  Gait is antalgic  A/P:  Orthostasis  Type 2 diabetes mellitus with diabetic neuropathy, with long-term current use of insulin ([place]) - Plan: Bayer DCA Hb A1c Waived  Hypertension associated with diabetes ([place])  Paroxysmal A-fib ([place])  I am going to try and eliminate some of the medications which may precipitate further orthostasis.  I am going to back down on Farxiga to 5 mg only.  Clinical pharmacy to arrange for 5 mg tablets for patient assistance program.  I am also going to work on getting Ozempic 1 mg for this patient to compensate for blood sugar which remains uncontrolled with A1c over 9 today.  Plan for close follow-up with clinical pharmacy in the next 2 to 3 weeks for blood sugar review  [name], [facility]   -------------------------------------------------------------------------------------------------------------------------------------------------------------------------------------      Subjective: CC: Syncope PCP: Charles Norlander, DO [name] is a 72 y.o. male presenting to clinic today for:  [name] went to the emergency department on 08/16/2021 following an episode of syncope.  Reports that he was standing up from the bed when he felt lightheaded and dizzy preceding his loss of consciousness. When he woke up, he felt weak but was not confused. He called EMS and was transported to the ED. Reports that he has not had any recent illnesses. No syncope with bowel movements. No recent sudden losses of consciousness. No recent episodes of hypoglycemia. Was previously told by his cardiologist that he has orthostatic hypotension.  ROS: Per HPI  No Known Allergies Past Medical History:  Diagnosis Date   Anxiety    Arthritis    Diabetes mellitus without complication (HCC)    GERD (gastroesophageal reflux disease)    Hypercholesteremia    Hypertension     Current Outpatient Medications:    ALPRAZolam (XANAX) 1 MG tablet, Take 0.5-1 tablets (0.5-1 mg total) by mouth 3 (three) times daily as needed for anxiety (take ONLY IF NEEDED)., Disp: 90 tablet, Rfl: 1   amLODipine (NORVASC) 5 MG tablet, Take 1 tablet (5 mg total) by mouth daily., Disp: 90 tablet, Rfl: 3   apixaban (ELIQUIS) 5 MG TABS tablet, Take 1 tablet (5 mg total) by mouth 2 (two) times daily., Disp: 180 tablet, Rfl: 3   Continuous Blood Gluc Sensor (FREESTYLE LIBRE 14 DAY SENSOR) MISC, Scan to check glucose at least 4 times daily. DX: E11.4, Disp: 2 each, Rfl: 12   famotidine (PEPCID) 20 MG tablet, Take 1 tablet (20 mg total) by mouth 2 (two) times daily., Disp: 180 tablet, Rfl: 3   furosemide (LASIX) 20 MG tablet, Take 1 tablet (20 mg total) by mouth daily., Disp:  90 tablet, Rfl: 3   glucose blood (ONETOUCH VERIO) test strip, Use as instructed to monitor glucose 4 times daily (use as back up method for CGM), Disp: 100 each, Rfl: 12   insulin degludec (TRESIBA FLEXTOUCH) 200 UNIT/ML FlexTouch Pen, Inject 50 Units into the skin in the morning., Disp: 18 mL, Rfl: 5   Insulin Pen Needle (B-D ULTRAFINE III SHORT PEN) 31G X 8 MM MISC, Use to give insulin daily Dx E11.9, Disp: 100 each, Rfl: 3   Lancets (ONETOUCH ULTRASOFT)  lancets, Use as instructed to monitor glucose 4 times daily (use as back up method for CGM)., Disp: 100 each, Rfl: 12   lisinopril (ZESTRIL) 40 MG tablet, Take 1 tablet (40 mg total) by mouth daily., Disp: 90 tablet, Rfl: 3   metFORMIN (GLUCOPHAGE-XR) 500 MG 24 hr tablet, TAKE 2 TABLETS BY MOUTH TWICE A DAY WITH FOOD, Disp: 360 tablet, Rfl: 3   mirtazapine (REMERON) 45 MG tablet, Take 1 tablet (45 mg total) by mouth at bedtime., Disp: 90 tablet, Rfl: 3   omeprazole (PRILOSEC) 20 MG capsule, Take 20 mg by mouth daily., Disp: , Rfl:    rosuvastatin (CRESTOR) 20 MG tablet, TAKE 1 TABLET BY MOUTH EVERYDAY AT BEDTIME, Disp: 90 tablet, Rfl: 3   Semaglutide,0.25 or 0.'5MG'$ /DOS, (OZEMPIC, 0.25 OR 0.5 MG/DOSE,) 2 MG/3ML SOPN, Inject 0.5 mg into the skin once a week., Disp: , Rfl:    sildenafil (VIAGRA) 50 MG tablet, Take 1 tablet (50 mg total) by mouth daily as needed for erectile dysfunction., Disp: 10 tablet, Rfl: 2   dapagliflozin propanediol (FARXIGA) 5 MG TABS tablet, Take 1 tablet (5 mg total) by mouth daily before breakfast., Disp: , Rfl:  Social History   Socioeconomic History   Marital status: Divorced    Spouse name: Not on file   Number of children: Not on file   Years of education: Not on file   Highest education level: Not on file  Occupational History   Not on file  Tobacco Use   Smoking status: Former    Packs/day: 0.50    Years: 41.00    Total pack years: 20.50    Types: Cigarettes    Quit date: 02/29/2012    Years since quitting: 10.4   Smokeless tobacco: Never  Vaping Use   Vaping Use: Never used  Substance and Sexual Activity   Alcohol use: No    Comment: quit 2012   Drug use: No    Comment: quit in 2012   Sexual activity: Yes    Birth control/protection: None  Other Topics Concern   Not on file  Social History Narrative   Not on file   Social Determinants of Health   Financial Resource Strain: Low Risk  (08/19/2022)   Overall Financial Resource Strain (CARDIA)     Difficulty of Paying Living Expenses: Not hard at all  Food Insecurity: No Food Insecurity (09/03/2021)   Hunger Vital Sign    Worried About Running Out of Food in the Last Year: Never true    Ran Out of Food in the Last Year: Never true  Transportation Needs: No Transportation Needs (08/19/2022)   PRAPARE - Hydrologist (Medical): No    Lack of Transportation (Non-Medical): No  Physical Activity: Insufficiently Active (09/03/2021)   Exercise Vital Sign    Days of Exercise per Week: 3 days    Minutes of Exercise per Session: 30 min  Stress: No Stress Concern Present (09/03/2021)  [employer] - Occupational Stress Questionnaire    Feeling of Stress : Not at all  Social Connections: Moderately Integrated (09/03/2021)   Social Connection and Isolation Panel [NHANES]    Frequency of Communication with Friends and Family: More than three times a week    Frequency of Social Gatherings with Friends and Family: More than three times a week    Attends Religious Services: More than 4 times per year    Active Member of [employer] or Organizations: Yes    Attends Music therapist: More than 4 times per year    Marital Status: Divorced  Intimate Partner Violence: Not At Risk (09/03/2021)   Humiliation, Afraid, Rape, and Kick questionnaire    Fear of Current or Ex-Partner: No    Emotionally Abused: No    Physically Abused: No    Sexually Abused: No   Family History  Problem Relation Age of Onset   Diabetes Mother    Glaucoma Mother    Objective: Office vital signs reviewed. BP 135/77   Pulse 74   Temp 98.8 F (37.1 C)   Ht 6' (1.829 m)   Wt 282 lb (127.9 kg)   SpO2 98%   BMI 38.25 kg/m   Physical Exam General: Awake, alert, well nourished, No acute distress HEENT:     Neck: No masses palpated. No lymphadenopathy    Ears: Tympanic membranes intact, normal light reflex, no erythema, no bulging. Leftward deviation of right  eye. Patient is legally blind in right eye.     Eyes: Mild scleral injection bilaterally.  Cardio: regular rate and irregular rhythm, S1S2 heard, no murmurs appreciated Pulm: clear to auscultation bilaterally, no wheezes, rhonchi or rales; normal work of breathing on room air Extremities: warm, well perfused, No edema, cyanosis or clubbing; +1 pulses bilaterally. No swelling.   Assessment/ Plan: 72 y.o. male   Given that the patient's loss of consciousness was preceded by standing up from a lying down position as well as a prodrome of dizziness, cardiogenic syncope is less likely at this time. Symptoms are most consistent with orthostatic hypotension leading to syncope. Less likely vasovagal. I am concerned that the patient's Farxiga medication may be contributing to his orthostasis given its mechanism of increasing urination. Discussed with Mr. Daubert that he should take 5 mg of Farxiga, decreased from 10 mg (patient can break in half). Will check A1c today as well to inform whether adjustment of other diabetes agents is necessary at this time. Advised him to return if he notices high glucose readings above 200. Patient expressed understanding of the plan.  Orders Placed This Encounter  Procedures   Bayer DCA Hb A1c Waived   Meds ordered this encounter  Medications   dapagliflozin propanediol (FARXIGA) 5 MG TABS tablet    Sig: Take 1 tablet (5 mg total) by mouth daily before breakfast.   Stephani Police, MS3

## 2022-08-29 ENCOUNTER — Other Ambulatory Visit (HOSPITAL_COMMUNITY): Payer: Self-pay

## 2022-08-29 NOTE — Telephone Encounter (Signed)
Done

## 2022-09-03 ENCOUNTER — Telehealth: Payer: Self-pay | Admitting: Pharmacist

## 2022-09-03 NOTE — Telephone Encounter (Signed)
Please send me PAP dose increase paperwork for ozempic '1mg'$   Thanks!

## 2022-09-04 ENCOUNTER — Ambulatory Visit (INDEPENDENT_AMBULATORY_CARE_PROVIDER_SITE_OTHER): Payer: Medicare Other

## 2022-09-04 VITALS — Ht 72.0 in | Wt 282.0 lb

## 2022-09-04 DIAGNOSIS — Z0000 Encounter for general adult medical examination without abnormal findings: Secondary | ICD-10-CM

## 2022-09-04 NOTE — Progress Notes (Signed)
Subjective:   [name] is a 72 y.o. male who presents for Medicare Annual/Subsequent preventive examination.  Review of Systems    Virtual Visit via Telephone Note  I connected with  [name] on 09/04/22 at  9:15 AM EST by telephone and verified that I am speaking with the correct person using two identifiers.  Location: Patient: Home Provider: Office Persons participating in the virtual visit: patient/Nurse Health Advisor   I discussed the limitations, risks, security and privacy concerns of performing an evaluation and management service by telephone and the availability of in person appointments. The patient expressed understanding and agreed to proceed.  Interactive audio and video telecommunications were attempted between this nurse and patient, however failed, due to patient having technical difficulties OR patient did not have access to video capability.  We continued and completed visit with audio only.  Some vital signs may be absent or patient reported.   Criselda Peaches, LPN  Cardiac Risk Factors include: advanced age (>21mn, >>56women);diabetes mellitus;male gender;hypertension     Objective:    Today's Vitals   09/04/22 0930 09/04/22 0931  Weight: 282 lb (127.9 kg)   Height: 6' (1.829 m)   PainSc:  0-No pain   Body mass index is 38.25 kg/m.     09/04/2022    9:42 AM 08/16/2022   10:11 AM 10/29/2021    4:19 PM 10/29/2021    7:17 AM 09/03/2021    9:03 AM 02/09/2019   11:36 AM 07/03/2015    8:05 AM  Advanced Directives  Does Patient Have a Medical Advance Directive? No No  No No No No  Would patient like information on creating a medical advance directive? No - Patient declined  No - Patient declined  No - Patient declined No - Patient declined No - patient declined information    Current Medications (verified) Outpatient Encounter Medications as of 09/04/2022  Medication Sig   ALPRAZolam (XANAX) 1 MG tablet Take 0.5-1 tablets (0.5-1 mg total) by  mouth 3 (three) times daily as needed for anxiety (take ONLY IF NEEDED).   amLODipine (NORVASC) 5 MG tablet Take 1 tablet (5 mg total) by mouth daily.   apixaban (ELIQUIS) 5 MG TABS tablet Take 1 tablet (5 mg total) by mouth 2 (two) times daily.   Continuous Blood Gluc Sensor (FREESTYLE LIBRE 14 DAY SENSOR) MISC Scan to check glucose at least 4 times daily. DX: E11.4   dapagliflozin propanediol (FARXIGA) 5 MG TABS tablet Take 1 tablet (5 mg total) by mouth daily before breakfast. (Patient not taking: Reported on 09/04/2022)   famotidine (PEPCID) 20 MG tablet Take 1 tablet (20 mg total) by mouth 2 (two) times daily.   furosemide (LASIX) 20 MG tablet Take 1 tablet (20 mg total) by mouth daily.   glucose blood (ONETOUCH VERIO) test strip Use as instructed to monitor glucose 4 times daily (use as back up method for CGM)   insulin degludec (TRESIBA FLEXTOUCH) 200 UNIT/ML FlexTouch Pen Inject 50 Units into the skin in the morning.   Insulin Pen Needle (B-D ULTRAFINE III SHORT PEN) 31G X 8 MM MISC Use to give insulin daily Dx E11.9   Lancets (ONETOUCH ULTRASOFT) lancets Use as instructed to monitor glucose 4 times daily (use as back up method for CGM).   lisinopril (ZESTRIL) 40 MG tablet Take 1 tablet (40 mg total) by mouth daily.   metFORMIN (GLUCOPHAGE-XR) 500 MG 24 hr tablet TAKE 2 TABLETS BY MOUTH TWICE A DAY WITH  FOOD   mirtazapine (REMERON) 45 MG tablet Take 1 tablet (45 mg total) by mouth at bedtime.   omeprazole (PRILOSEC) 20 MG capsule Take 20 mg by mouth daily.   rosuvastatin (CRESTOR) 20 MG tablet TAKE 1 TABLET BY MOUTH EVERYDAY AT BEDTIME   Semaglutide,0.25 or 0.'5MG'$ /DOS, (OZEMPIC, 0.25 OR 0.5 MG/DOSE,) 2 MG/3ML SOPN Inject 0.5 mg into the skin once a week.   sildenafil (VIAGRA) 50 MG tablet Take 1 tablet (50 mg total) by mouth daily as needed for erectile dysfunction.   No facility-administered encounter medications on file as of 09/04/2022.    Allergies (verified) Patient has no known  allergies.   History: Past Medical History:  Diagnosis Date   Anxiety    Arthritis    Diabetes mellitus without complication (HCC)    GERD (gastroesophageal reflux disease)    Hypercholesteremia    Hypertension    Past Surgical History:  Procedure Laterality Date   boils     removed form back of skull   CATARACT EXTRACTION W/PHACO Right 04/02/2013   Procedure: CATARACT EXTRACTION PHACO AND INTRAOCULAR LENS PLACEMENT ([place]);  Surgeon: Williams Che, MD;  Location: [facility];  Service: Ophthalmology;  Laterality: Right;  CDE 7.00   CATARACT EXTRACTION W/PHACO Left 10/04/2013   Procedure: CATARACT EXTRACTION PHACO AND INTRAOCULAR LENS PLACEMENT ([facility]);  Surgeon: Williams Che, MD;  Location: [facility];  Service: Ophthalmology;  Laterality: Left;  CDE:1.46   COLONOSCOPY N/A 07/03/2015   Procedure: COLONOSCOPY;  Surgeon: Rogene Houston, MD;  Location: [facility];  Service: Endoscopy;  Laterality: N/A;  830   EYE SURGERY Right    "valve replaced and stent placed" per pt to reroute the fluid in his eye   ROTATOR CUFF REPAIR Right    Family History  Problem Relation Age of Onset   Diabetes Mother    Glaucoma Mother    Social History   Socioeconomic History   Marital status: Divorced    Spouse name: Not on file   Number of children: Not on file   Years of education: Not on file   Highest education level: Not on file  Occupational History   Not on file  Tobacco Use   Smoking status: Former    Packs/day: 0.50    Years: 41.00    Total pack years: 20.50    Types: Cigarettes    Quit date: 02/29/2012    Years since quitting: 10.5   Smokeless tobacco: Never  Vaping Use   Vaping Use: Never used  Substance and Sexual Activity   Alcohol use: No    Comment: quit 2012   Drug use: No    Comment: quit in 2012   Sexual activity: Yes    Birth control/protection: None  Other Topics Concern   Not on file  Social History Narrative   Not on file   Social Determinants of Health    Financial Resource Strain: Low Risk  (09/04/2022)   Overall Financial Resource Strain (CARDIA)    Difficulty of Paying Living Expenses: Not hard at all  Food Insecurity: No Food Insecurity (09/04/2022)   Hunger Vital Sign    Worried About Running Out of Food in the Last Year: Never true    [place] in the Last Year: Never true  Transportation Needs: No Transportation Needs (09/04/2022)   PRAPARE - Hydrologist (Medical): No    Lack of Transportation (Non-Medical): No  Physical Activity: Inactive (09/04/2022)   Exercise  Vital Sign    Days of Exercise per Week: 0 days    Minutes of Exercise per Session: 0 min  Stress: No Stress Concern Present (09/04/2022)   [place]    Feeling of Stress : Not at all  Social Connections: Moderately Integrated (09/04/2022)   Social Connection and Isolation Panel [NHANES]    Frequency of Communication with Friends and Family: More than three times a week    Frequency of Social Gatherings with Friends and Family: More than three times a week    Attends Religious Services: More than 4 times per year    Active Member of [employer] or Organizations: Yes    Attends Music therapist: More than 4 times per year    Marital Status: Divorced    Tobacco Counseling Counseling given: Not Answered   Clinical Intake:  Pre-visit preparation completed: No  Pain : No/denies pain Pain Score: 0-No pain   Nutrition Risk Assessment:  Has the patient had any N/V/D within the last 2 months?  No  Does the patient have any non-healing wounds?  No  Has the patient had any unintentional weight loss or weight gain?  No   Diabetes:  Is the patient diabetic?  Yes  If diabetic, was a CBG obtained today?  Yes BG 114 Taken by patient Did the patient bring in their glucometer from home?  No  How often do you monitor your CBG's? 4 X Daily   Financial Strains and  Diabetes Management:  Are you having any financial strains with the device, your supplies or your medication? No .  Does the patient want to be seen by Chronic Care Management for management of their diabetes?  No  Would the patient like to be referred to a Nutritionist or for Diabetic Management?  No   Diabetic Exams:  Diabetic Eye Exam: Completed No. Overdue for diabetic eye exam. Pt has been advised about the importance in completing this exam. A referral has been placed today. Message sent to referral coordinator for scheduling purposes. Advised pt to expect a call from office referred to regarding appt.  Diabetic Foot Exam: Completed No. Pt has been advised about the importance in completing this exam. Pt is scheduled for diabetic foot exam on Followed by Dr Dwyane Dee.    BMI - recorded: 38.75 Nutritional Status: BMI > 30  Obese Nutritional Risks: None Diabetes: Yes CBG done?: Yes CBG resulted in Enter/ Edit results?: Yes (CBG 14 taken by patient) Did pt. bring in CBG monitor from home?: No  How often do you need to have someone help you when you read instructions, pamphlets, or other written materials from your doctor or pharmacy?: 1 - Never  Diabetic?  Yes  Interpreter Needed?: No  Information entered by :: Rolene Arbour LPN   Activities of Daily Living    09/04/2022    9:38 AM 10/29/2021    4:19 PM  In your present state of health, do you have any difficulty performing the following activities:  Hearing? 0   Vision? 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 0   Walking or climbing stairs? 0   Dressing or bathing? 0   Doing errands, shopping? 0 0  Preparing Food and eating ? N   Using the Toilet? N   In the past six months, have you accidently leaked urine? N   Do you have problems  with loss of bowel control? N   Managing your Medications? N   Managing your Finances? N   Housekeeping or managing your Housekeeping?  N     Patient Care Team: [name], DO as PCP - General (Family Medicine) Buford Dresser, MD as PCP - Cardiology (Cardiology) Lavera Guise, [facility] (Pharmacist)  Indicate any recent Medical Services you may have received from other than [facility] providers in the past year (date may be approximate).     Assessment:   This is a routine wellness examination for Peggy.  Hearing/Vision screen Hearing Screening - Comments:: Denies hearing difficulties   Vision Screening - Comments:: Wears rx glasses - up to date with routine eye exams with  Dr Truman Hayward  Dietary issues and exercise activities discussed: Current Exercise Habits: The patient does not participate in regular exercise at present, Exercise limited by: None identified   Goals Addressed               This Visit's Progress     Patient stated (pt-stated)        I would like to see out of my right eye.       Depression Screen    09/04/2022    9:36 AM 08/27/2022   10:37 AM 05/15/2022   11:42 AM 02/12/2022   11:30 AM 11/06/2021   12:32 PM 09/03/2021    9:00 AM 06/11/2021    9:53 AM  PHQ 2/9 Scores  PHQ - 2 Score 0 0 0 2 0 0 0  PHQ- 9 Score    6   1    Fall Risk    09/04/2022    9:39 AM 08/27/2022   10:45 AM 08/27/2022   10:37 AM 05/15/2022   11:42 AM 02/12/2022   11:30 AM  Fall Risk   Falls in the past year? 1 1 0 0 0  Number falls in past yr: 0 0 0    Injury with Fall? 0 1 0    Comment No injury followed by medical attention      Risk for fall due to : No Fall Risks Impaired vision;Impaired mobility No Fall Risks    Follow up Falls prevention discussed Education provided       FALL RISK PREVENTION PERTAINING TO THE HOME:  Any stairs in or around the home? No  If so, are there any without handrails? No  Home free of loose throw rugs in walkways, pet beds, electrical cords, etc? Yes  Adequate lighting in your home to reduce risk of falls? Yes   ASSISTIVE DEVICES UTILIZED TO PREVENT FALLS:  Life  alert? No  Use of a cane, walker or w/c? Yes  Grab bars in the bathroom? No  Shower chair or bench in shower? No  Elevated toilet seat or a handicapped toilet? No   TIMED UP AND GO:  Was the test performed? No . Audio Visit  Cognitive Function:        09/04/2022    9:42 AM 09/03/2021    9:07 AM  6CIT Screen  What Year? 0 points 0 points  What month? 0 points 0 points  What time? 0 points 0 points  Count back from 20 0 points 0 points  Months in reverse 2 points 0 points  Repeat phrase 4 points 6 points  Total Score 6 points 6 points    Immunizations Immunization History  Administered Date(s) Administered   Fluad Quad(high Dose 65+) 04/30/2017, 04/13/2019, 05/15/2022   Influenza Split 04/19/2014, 05/29/2020  Influenza, Seasonal, Injecte, Preservative Fre 05/09/2015   Influenza-Unspecified 05/18/2014, 05/09/2015, 04/22/2018, 05/24/2020, 05/19/2021   Moderna SARS-COV2 Booster Vaccination 12/13/2020   Moderna Sars-Covid-2 Vaccination 09/30/2019, 10/29/2019, 06/24/2020, 12/13/2020   Pneumococcal Conjugate-13 04/23/2018   Pneumococcal Polysaccharide-23 12/28/2015   Zoster Recombinat (Shingrix) 02/12/2022   Zoster, Live 01/04/2016    TDAP status: Due, Education has been provided regarding the importance of this vaccine. Advised may receive this vaccine at local pharmacy or Health Dept. Aware to provide a copy of the vaccination record if obtained from local pharmacy or Health Dept. Verbalized acceptance and understanding.  Flu Vaccine status: Up to date  Pneumococcal vaccine status: Up to date  Covid-19 vaccine status: Completed vaccines  Qualifies for Shingles Vaccine? Yes   Zostavax completed Yes   Shingrix Completed?: Yes  Screening Tests Health Maintenance  Topic Date Due   DTaP/Tdap/Td (1 - Tdap) Never done   OPHTHALMOLOGY EXAM  09/04/2022 (Originally 04/19/2020)   COVID-19 Vaccine (5 - 2023-24 season) 09/20/2022 (Originally 04/05/2022)   Zoster Vaccines-  Shingrix (2 of 2) 11/15/2022 (Originally 04/09/2022)   Lung Cancer Screening  10/30/2022   FOOT EXAM  02/13/2023   HEMOGLOBIN A1C  02/25/2023   Diabetic kidney evaluation - Urine ACR  05/16/2023   Diabetic kidney evaluation - eGFR measurement  08/17/2023   Medicare Annual Wellness (AWV)  09/05/2023   COLONOSCOPY (Pts 45-46yr Insurance coverage will need to be confirmed)  07/02/2025   Pneumonia Vaccine 72 Years old  Completed   INFLUENZA VACCINE  Completed   Hepatitis C Screening  Completed   HPV VACCINES  Aged Out    Health Maintenance  Health Maintenance Due  Topic Date Due   DTaP/Tdap/Td (1 - Tdap) Never done    Colorectal cancer screening: Type of screening: Colonoscopy. Completed 07/03/15. Repeat every 10 years  Lung Cancer Screening: (Low Dose CT Chest recommended if Age 72-80years, 30 pack-year currently smoking OR have quit w/in 15years.) does not qualify.     Additional Screening:  Hepatitis C Screening: does qualify; Completed 08/24/18  Vision Screening: Recommended annual ophthalmology exams for early detection of glaucoma and other disorders of the eye. Is the patient up to date with their annual eye exam?  Yes  Who is the provider or what is the name of the office in which the patient attends annual eye exams? Dr LTruman HaywardIf pt is not established with a provider, would they like to be referred to a provider to establish care? No .   Dental Screening: Recommended annual dental exams for proper oral hygiene  Community Resource Referral / Chronic Care Management:  CRR required this visit?  No   CCM required this visit?  No      Plan:     I have personally reviewed and noted the following in the patient's chart:   Medical and social history Use of alcohol, tobacco or illicit drugs  Current medications and supplements including opioid prescriptions. Patient is not currently taking opioid prescriptions. Functional ability and status Nutritional status Physical  activity Advanced directives List of other physicians Hospitalizations, surgeries, and ER visits in previous 12 months Vitals Screenings to include cognitive, depression, and falls Referrals and appointments  In addition, I have reviewed and discussed with patient certain preventive protocols, quality metrics, and best practice recommendations. A written personalized care plan for preventive services as well as general preventive health recommendations were provided to patient.     BCriselda Peaches LPN   [id number]  Nurse Notes: None

## 2022-09-04 NOTE — Patient Instructions (Addendum)
[name] , Thank you for taking time to come for your Medicare Wellness Visit. I appreciate your ongoing commitment to your health goals. Please review the following plan we discussed and let me know if I can assist you in the future.   These are the goals we discussed:  Goals       Exercise 3x per week (30 min per time)      Try to increase as tolerated.      Patient stated (pt-stated)      I would like to see out of my right eye.      T2DM-PHARMD GOAL (pt-stated)      Current Barriers:  Unable to independently afford treatment regimen Unable to maintain control of T2DM  Pharmacist Clinical Goal(s):  Over the next 90 days, patient will verbalize ability to afford treatment regimen maintain control of T2DM as evidenced by [place]  through collaboration with PharmD and provider.    Interventions: 1:1 collaboration with [name], DO regarding development and update of comprehensive plan of care as evidenced by provider attestation and co-signature Inter-disciplinary care team collaboration (see longitudinal plan of care) Comprehensive medication review performed; medication list updated in electronic medical record  Diabetes: Uncontrolled (improved); current treatment: TRESIBA 50-60 units daily, metformin, FARXIGA '10MG'$  DAILY; Ozempic 0.'25mg'$  weekly A1c 8.1%--> 7.7% CONTINUE Ozempic 0.'25mg'$  weekly  Denies personal and family history of Medullary thyroid cancer (MTC) Submitted novo nordisk application Sample given today to bridge to patient supply CONTINUE [place] well; no side effects; new onset heart failure Okay with cards, endocrine and PCP Approved for az&me PAP--ships to home Continue Tresiba Patient no longer on Novolog meal time insulin with addition of Ozempic Follows with endocrine Current glucose readings: fasting glucose: <130, post prandial glucose: 150-180 Uses libre CGM/[facility] via [facility] Denies  hypoglycemic/hyperglycemic symptoms Discussed meal planning options and Plate method for healthy eating Avoid sugary drinks and desserts Incorporate balanced protein, non starchy veggies, 1 serving of carbohydrate with each meal Increase water intake Increase physical activity as able Current exercise: n/a Assessed patient finances. Re-enrollment approved for novo nordisk patient assistance program for 2023; ships to PCP office; [place] ships to home via [facility] on behalf of az&me PAP Education provided on diet and lifestyle modifications   Patient Goals/Self-Care Activities Over the next 90 days, patient will:  - take medications as prescribed check glucose 3 times daily or if symptomatic, document, and provide at future appointments  Follow Up Plan: Telephone follow up appointment with care management team member scheduled for: 3 months         This is a list of the screening recommended for you and due dates:  Health Maintenance  Topic Date Due   DTaP/Tdap/Td vaccine (1 - Tdap) Never done   Eye exam for diabetics  09/04/2022*   COVID-19 Vaccine (5 - 2023-24 season) 09/20/2022*   Zoster (Shingles) Vaccine (2 of 2) 11/15/2022*   Screening for Lung Cancer  10/30/2022   Complete foot exam   02/13/2023   Hemoglobin A1C  02/25/2023   Yearly kidney health urinalysis for diabetes  05/16/2023   Yearly kidney function blood test for diabetes  08/17/2023   Medicare Annual Wellness Visit  09/05/2023   Colon Cancer Screening  07/02/2025   Pneumonia Vaccine  Completed   Flu Shot  Completed   Hepatitis C Screening: USPSTF Recommendation to screen - Ages 18-79 yo.  Completed   HPV Vaccine  Aged Out  *Topic  was postponed. The date shown is not the original due date.    Advanced directives: Advance directive discussed with you today. Even though you declined this today, please call our office should you change your mind, and we can give you the proper paperwork for you to fill  out.   Conditions/risks identified: None  Next appointment: Follow up in one year for your annual wellness visit.   Preventive Care 45 Years and Older, Male  Preventive care refers to lifestyle choices and visits with your health care provider that can promote health and wellness. What does preventive care include? A yearly physical exam. This is also called an annual well check. Dental exams once or twice a year. Routine eye exams. Ask your health care provider how often you should have your eyes checked. Personal lifestyle choices, including: Daily care of your teeth and gums. Regular physical activity. Eating a healthy diet. Avoiding tobacco and drug use. Limiting alcohol use. Practicing safe sex. Taking low doses of aspirin every day. Taking vitamin and mineral supplements as recommended by your health care provider. What happens during an annual well check? The services and screenings done by your health care provider during your annual well check will depend on your age, overall health, lifestyle risk factors, and family history of disease. Counseling  Your health care provider may ask you questions about your: Alcohol use. Tobacco use. Drug use. Emotional well-being. Home and relationship well-being. Sexual activity. Eating habits. History of falls. Memory and ability to understand (cognition). Work and work Statistician. Screening  You may have the following tests or measurements: Height, weight, and BMI. Blood pressure. Lipid and cholesterol levels. These may be checked every 5 years, or more frequently if you are over 16 years old. Skin check. Lung cancer screening. You may have this screening every year starting at age 42 if you have a 30-pack-year history of smoking and currently smoke or have quit within the past 15 years. Fecal occult blood test (FOBT) of the stool. You may have this test every year starting at age 78. Flexible sigmoidoscopy or colonoscopy.  You may have a sigmoidoscopy every 5 years or a colonoscopy every 10 years starting at age 37. Prostate cancer screening. Recommendations will vary depending on your family history and other risks. Hepatitis C blood test. Hepatitis B blood test. Sexually transmitted disease (STD) testing. Diabetes screening. This is done by checking your blood sugar (glucose) after you have not eaten for a while (fasting). You may have this done every 1-3 years. Abdominal aortic aneurysm (AAA) screening. You may need this if you are a current or former smoker. Osteoporosis. You may be screened starting at age 31 if you are at high risk. Talk with your health care provider about your test results, treatment options, and if necessary, the need for more tests. Vaccines  Your health care provider may recommend certain vaccines, such as: Influenza vaccine. This is recommended every year. Tetanus, diphtheria, and acellular pertussis (Tdap, Td) vaccine. You may need a Td booster every 10 years. Zoster vaccine. You may need this after age 33. Pneumococcal 13-valent conjugate (PCV13) vaccine. One dose is recommended after age 1. Pneumococcal polysaccharide (PPSV23) vaccine. One dose is recommended after age 6. Talk to your health care provider about which screenings and vaccines you need and how often you need them. This information is not intended to replace advice given to you by your health care provider. Make sure you discuss any questions you have with your health care  provider. Document Released: 08/18/2015 Document Revised: 04/10/2016 Document Reviewed: 05/23/2015 Elsevier Interactive Patient Education  2017 [place] Prevention in the Home Falls can cause injuries. They can happen to people of all ages. There are many things you can do to make your home safe and to help prevent falls. What can I do on the outside of my home? Regularly fix the edges of walkways and driveways and fix any  cracks. Remove anything that might make you trip as you walk through a door, such as a raised step or threshold. Trim any bushes or trees on the path to your home. Use bright outdoor lighting. Clear any walking paths of anything that might make someone trip, such as rocks or tools. Regularly check to see if handrails are loose or broken. Make sure that both sides of any steps have handrails. Any raised decks and porches should have guardrails on the edges. Have any leaves, snow, or ice cleared regularly. Use sand or salt on walking paths during winter. Clean up any spills in your garage right away. This includes oil or grease spills. What can I do in the bathroom? Use night lights. Install grab bars by the toilet and in the tub and shower. Do not use towel bars as grab bars. Use non-skid mats or decals in the tub or shower. If you need to sit down in the shower, use a plastic, non-slip stool. Keep the floor dry. Clean up any water that spills on the floor as soon as it happens. Remove soap buildup in the tub or shower regularly. Attach bath mats securely with double-sided non-slip rug tape. Do not have throw rugs and other things on the floor that can make you trip. What can I do in the bedroom? Use night lights. Make sure that you have a light by your bed that is easy to reach. Do not use any sheets or blankets that are too big for your bed. They should not hang down onto the floor. Have a firm chair that has side arms. You can use this for support while you get dressed. Do not have throw rugs and other things on the floor that can make you trip. What can I do in the kitchen? Clean up any spills right away. Avoid walking on wet floors. Keep items that you use a lot in easy-to-reach places. If you need to reach something above you, use a strong step stool that has a grab bar. Keep electrical cords out of the way. Do not use floor polish or wax that makes floors slippery. If you must  use wax, use non-skid floor wax. Do not have throw rugs and other things on the floor that can make you trip. What can I do with my stairs? Do not leave any items on the stairs. Make sure that there are handrails on both sides of the stairs and use them. Fix handrails that are broken or loose. Make sure that handrails are as long as the stairways. Check any carpeting to make sure that it is firmly attached to the stairs. Fix any carpet that is loose or worn. Avoid having throw rugs at the top or bottom of the stairs. If you do have throw rugs, attach them to the floor with carpet tape. Make sure that you have a light switch at the top of the stairs and the bottom of the stairs. If you do not have them, ask someone to add them for you. What else can I do  to help prevent falls? Wear shoes that: Do not have high heels. Have rubber bottoms. Are comfortable and fit you well. Are closed at the toe. Do not wear sandals. If you use a stepladder: Make sure that it is fully opened. Do not climb a closed stepladder. Make sure that both sides of the stepladder are locked into place. Ask someone to hold it for you, if possible. Clearly mark and make sure that you can see: Any grab bars or handrails. First and last steps. Where the edge of each step is. Use tools that help you move around (mobility aids) if they are needed. These include: Canes. Walkers. Scooters. Crutches. Turn on the lights when you go into a dark area. Replace any light bulbs as soon as they burn out. Set up your furniture so you have a clear path. Avoid moving your furniture around. If any of your floors are uneven, fix them. If there are any pets around you, be aware of where they are. Review your medicines with your doctor. Some medicines can make you feel dizzy. This can increase your chance of falling. Ask your doctor what other things that you can do to help prevent falls. This information is not intended to replace  advice given to you by your health care provider. Make sure you discuss any questions you have with your health care provider. Document Released: 05/18/2009 Document Revised: 12/28/2015 Document Reviewed: 08/26/2014 Elsevier Interactive Patient Education  2017 [employer].

## 2022-09-05 ENCOUNTER — Ambulatory Visit (INDEPENDENT_AMBULATORY_CARE_PROVIDER_SITE_OTHER): Payer: Medicare Other | Admitting: Pharmacist

## 2022-09-05 VITALS — BP 132/76 | HR 65

## 2022-09-05 DIAGNOSIS — E1140 Type 2 diabetes mellitus with diabetic neuropathy, unspecified: Secondary | ICD-10-CM

## 2022-09-05 DIAGNOSIS — E1159 Type 2 diabetes mellitus with other circulatory complications: Secondary | ICD-10-CM

## 2022-09-10 ENCOUNTER — Encounter: Payer: Self-pay | Admitting: Pharmacist

## 2022-09-10 ENCOUNTER — Ambulatory Visit: Payer: Medicare Other | Admitting: Pharmacist

## 2022-09-10 VITALS — BP 138/75 | HR 81

## 2022-09-10 DIAGNOSIS — E1140 Type 2 diabetes mellitus with diabetic neuropathy, unspecified: Secondary | ICD-10-CM

## 2022-09-10 DIAGNOSIS — I152 Hypertension secondary to endocrine disorders: Secondary | ICD-10-CM

## 2022-09-10 DIAGNOSIS — Z794 Long term (current) use of insulin: Secondary | ICD-10-CM

## 2022-09-10 NOTE — Progress Notes (Signed)
Chronic Care Management Pharmacy Note  04/25/2022 Name:  [name] MRN:  [id number] DOB:  July 27, 1951  Summary:  Diabetes: Uncontrolled (improved); current treatment: TRESIBA 50-60 units daily, metformin, Ozempic 0.5 mg weekly A1c 8.1%--> 7.7% Increase Ozempic to 1 mg weekly once shipment comes in.  Denies personal and family history of Medullary thyroid cancer (MTC) Submitted novo nordisk application New 1 mg dose should ship to the clinic in ~3 weeks. Will contact patient when it is available for pick up.  HOLD Wilder Glade Unable to tolerate Farxiga 5 mg due to orthostatic hypotension. May consider restarting at follow-up   BP in clinic 138/75 mmHg Okay with cards, endocrine and PCP Approved for az&me PAP--ships to home Continue Tresiba 50 units daily. Instructed to no longer use 60 units of Tresiba given increase in Ozempic.  Patient no longer on Novolog meal time insulin with addition of Ozempic Follows with endocrine Current glucose readings: fasting glucose: <130, post prandial glucose: 150-180 Uses libre CGM/[facility] via [facility] portal Denies hypoglycemic/hyperglycemic symptoms Discussed meal planning options and Plate method for healthy eating Avoid sugary drinks and desserts Incorporate balanced protein, non starchy veggies, 1 serving of carbohydrate with each meal Increase water intake Increase physical activity as able Current exercise: n/a Assessed patient finances. Re-enrollment approved for novo nordisk patient assistance program for 2024; ships to PCP office; [place] ships to home via [facility] on behalf of az&me PAP (holding Wilder Glade for now) Education provided on diet and lifestyle modifications   Patient Goals/Self-Care Activities Over the next 90 days, patient will:  - take medications as prescribed check glucose 3 times daily or if symptomatic, document, and provide at future appointments  Subjective: [name] is an 72 y.o. year old  male who is a primary patient of [name], DO.  The CCM team was consulted for assistance with disease management and care coordination needs.    Engaged with patient face to face for follow up visit in response to provider referral for pharmacy case management and/or care coordination services.   Consent to Services:  The patient was given information about Chronic Care Management services, agreed to services, and gave verbal consent prior to initiation of services.  Please see initial visit note for detailed documentation.   Patient Care Team: [name], DO as PCP - General (Family Medicine) Buford Dresser, MD as PCP - Cardiology (Cardiology) Lavera Guise, [facility] (Pharmacist)  Objective:  Lab Results  Component Value Date   CREATININE 0.94 08/16/2022   CREATININE 0.93 11/23/2021   CREATININE 1.05 11/06/2021    Lab Results  Component Value Date   HGBA1C 9.1 (H) 08/27/2022   Last diabetic Eye exam:  Lab Results  Component Value Date/Time   HMDIABEYEEXA Retinopathy (A) 04/20/2019 12:00 AM    Last diabetic Foot exam: No results found for: "HMDIABFOOTEX"      Component Value Date/Time   CHOL 103 06/11/2021 0958   TRIG 98 06/11/2021 0958   HDL 27 (L) 06/11/2021 0958   CHOLHDL 3.8 06/11/2021 0958   LDLCALC 57 06/11/2021 0958       Latest Ref Rng & Units 08/16/2022   10:20 AM 10/29/2021    8:10 AM 06/11/2021    9:58 AM  Hepatic Function  Total Protein 6.5 - 8.1 g/dL 8.4  8.2  7.4   Albumin 3.5 - 5.0 g/dL 3.7  3.7  4.1   AST 15 - 41 U/L 26  24  34   ALT 0 -  44 U/L 20  17  32   Alk Phosphatase 38 - 126 U/L 82  87  141   Total Bilirubin 0.3 - 1.2 mg/dL 0.7  0.8  0.7     Lab Results  Component Value Date/Time   TSH 0.781 08/16/2022 10:20 AM   TSH 0.663 10/29/2021 08:10 AM   TSH 0.280 (L) 07/05/2019 09:19 AM   TSH 0.124 (L) 04/28/2019 08:19 AM       Latest Ref Rng & Units 08/16/2022   10:20 AM 11/06/2021   12:14 PM 10/30/2021    5:28 AM   CBC  WBC 4.0 - 10.5 K/uL 4.6  6.2  6.5   Hemoglobin 13.0 - 17.0 g/dL 15.2  14.1  12.4   Hematocrit 39.0 - 52.0 % 49.6  45.7  40.8   Platelets 150 - 400 K/uL 226  252  220     Lab Results  Component Value Date/Time   VD25OH 49.1 04/06/2019 10:36 AM    Clinical ASCVD: No  The ASCVD Risk score (Arnett DK, et al., 2019) failed to calculate for the following reasons:   The valid total cholesterol range is 130 to 320 mg/dL    Other: (CHADS2VASc if Afib, PHQ9 if depression, MMRC or CAT for COPD, ACT, DEXA)  Social History   Tobacco Use  Smoking Status Former   Packs/day: 0.50   Years: 41.00   Total pack years: 20.50   Types: Cigarettes   Quit date: 02/29/2012   Years since quitting: 10.5  Smokeless Tobacco Never   BP Readings from Last 3 Encounters:  09/10/22 138/75  08/27/22 135/77  08/16/22 (!) 145/72   Pulse Readings from Last 3 Encounters:  09/10/22 81  08/27/22 74  08/16/22 79   Wt Readings from Last 3 Encounters:  09/04/22 282 lb (127.9 kg)  08/27/22 282 lb (127.9 kg)  08/16/22 286 lb (129.7 kg)    Assessment: Review of patient past medical history, allergies, medications, health status, including review of consultants reports, laboratory and other test data, was performed as part of comprehensive evaluation and provision of chronic care management services.   SDOH:  (Social Determinants of Health) assessments and interventions performed:  SDOH Interventions    Flowsheet Row Clinical Support from 09/04/2022 in [facility] Telephone from 08/19/2022 in [place] Coordination Clinical Support from 09/03/2021 in [place] Office Visit from 06/11/2021 in [facility] Office Visit from 10/30/2020 in [place]  SDOH Interventions       Food Insecurity Interventions Intervention Not Indicated --  Intervention Not Indicated -- --  Housing Interventions Intervention Not Indicated -- Intervention Not Indicated -- --  Transportation Interventions Intervention Not Indicated Intervention Not Indicated Intervention Not Indicated -- --  Utilities Interventions Intervention Not Indicated -- -- -- --  Alcohol Usage Interventions Intervention Not Indicated (Score <7) -- -- -- --  Depression Interventions/Treatment  -- -- -- Currently on Treatment Medication  Financial Strain Interventions Intervention Not Indicated Intervention Not Indicated Intervention Not Indicated -- --  Physical Activity Interventions Intervention Not Indicated -- Other (Comments) -- --  Stress Interventions Intervention Not Indicated -- Intervention Not Indicated -- --  Social Connections Interventions Intervention Not Indicated -- Intervention Not Indicated -- --       CCM Care Plan  Allergies  Allergen Reactions   Farxiga [Dapagliflozin] Other (See Comments)    Dizziness, passed out/orthostasis  Medications Reviewed Today     Reviewed by Lavera Guise, [facility] (Pharmacist) on 09/10/22 at [phone]  Med List Status: <None>   Medication Order Taking? Sig Documenting Provider Last Dose Status Informant  ALPRAZolam (XANAX) 1 MG tablet [id number] No Take 0.5-1 tablets (0.5-1 mg total) by mouth 3 (three) times daily as needed for anxiety (take ONLY IF NEEDED). Ronnie Doss M, DO Taking Active   amLODipine (NORVASC) 5 MG tablet [id number] No Take 1 tablet (5 mg total) by mouth daily. Ronnie Doss M, DO Taking Active   apixaban (ELIQUIS) 5 MG TABS tablet [id number] No Take 1 tablet (5 mg total) by mouth 2 (two) times daily. Buford Dresser, MD Taking Active   Continuous Blood Gluc Sensor (FREESTYLE LIBRE 14 DAY SENSOR) [place] [id number] No Scan to check glucose at least 4 times daily. DX: E11.4 [name], DO Taking Active Self  dapagliflozin propanediol (FARXIGA) 5 MG TABS tablet [id number] No Take 1 tablet  (5 mg total) by mouth daily before breakfast.  Patient not taking: Reported on 09/04/2022   [name], DO Not Taking Active            Med Note Lavera Guise   Tue Sep 10, 2022  9:42 AM) HOLDING  famotidine (PEPCID) 20 MG tablet [id number] No Take 1 tablet (20 mg total) by mouth 2 (two) times daily. Ronnie Doss M, DO Taking Active   furosemide (LASIX) 20 MG tablet [id number] No Take 1 tablet (20 mg total) by mouth daily. Buford Dresser, MD Taking Active   glucose blood [facility] VERIO) test strip [id number] No Use as instructed to monitor glucose 4 times daily (use as back up method for CGM) Brita Romp, NP Taking Active Self  insulin degludec (TRESIBA FLEXTOUCH) 200 UNIT/ML FlexTouch Pen [id number] No Inject 50 Units into the skin in the morning. Brita Romp, NP Taking Active            Med Note Lavera Guise   Tue Sep 10, 2022  9:42 AM) Via novo nordisk patient assistance program    Insulin Pen Needle (B-D ULTRAFINE III SHORT PEN) 31G X 8 MM MISC [id number] No Use to give insulin daily Dx E11.9 Baruch Gouty, FNP Taking Active Self  Lancets [facility] ULTRASOFT) lancets [id number] No Use as instructed to monitor glucose 4 times daily (use as back up method for CGM). Brita Romp, NP Taking Active Self  lisinopril (ZESTRIL) 40 MG tablet [id number] No Take 1 tablet (40 mg total) by mouth daily. Ronnie Doss M, DO Taking Active   metFORMIN (GLUCOPHAGE-XR) 500 MG 24 hr tablet [id number] No TAKE 2 TABLETS BY MOUTH TWICE A DAY WITH FOOD Ronnie Doss M, DO Taking Active   mirtazapine (REMERON) 45 MG tablet [id number] No Take 1 tablet (45 mg total) by mouth at bedtime. Ronnie Doss M, DO Taking Active   omeprazole (PRILOSEC) 20 MG capsule [id number] No Take 20 mg by mouth daily. [provider] Taking Active Self  rosuvastatin (CRESTOR) 20 MG tablet [id number] No TAKE 1 TABLET BY MOUTH EVERYDAY AT BEDTIME Ronnie Doss M, DO Taking Active    Semaglutide,0.25 or 0.'5MG'$ /DOS, (OZEMPIC, 0.25 OR 0.5 MG/DOSE,) 2 MG/3ML SOPN [id number] No Inject 0.5 mg into the skin once a week. [provider] Taking Active            Med Note Blanca Friend, Royce Macadamia   Fri Mar 22, 2022  9:53 AM) Via novo nordisk patient assistance program    sildenafil (  VIAGRA) 50 MG tablet [id number] No Take 1 tablet (50 mg total) by mouth daily as needed for erectile dysfunction. [name], DO Taking Active Self  Med List Note Loetta Rough 03/31/13 1303): Lisinopril (strength)             Patient Active Problem List   Diagnosis Date Noted   Acute CHF ([place]) 10/29/2021   Atrial fibrillation ([place]) 10/29/2021   Proliferative diabetic retinopathy of left eye with macular edema associated with type 2 diabetes mellitus ([place]) 11/09/2019   Diabetic macular edema of right eye with proliferative retinopathy associated with type 2 diabetes mellitus ([place]) 11/09/2019   Optic atrophy associated with retinal dystrophies 11/09/2019   Primary open angle glaucoma of both eyes, severe stage 11/09/2019   Low TSH level 07/07/2019   Diabetic retinopathy of both eyes ([place]) 07/07/2019   Constipation due to pain medication 04/06/2019   Morbid obesity ([place]) 04/06/2019   Controlled substance agreement signed 04/06/2019   Corneal edema 12/16/2018   Diabetes mellitus ([place]) 10/12/2018   Primary open angle glaucoma (POAG) of right eye, severe stage 09/15/2017   Coronary artery disease due to lipid rich plaque 10/28/2016   Gastroesophageal reflux disease without esophagitis 01/30/2016   Chronic bilateral low back pain without sciatica 01/03/2015   Hypertension associated with diabetes ([place]) 01/03/2015   Primary osteoarthritis involving multiple joints 01/03/2015   Vitamin D deficiency 01/03/2015   GAD (generalized anxiety disorder) 01/03/2015   Erectile dysfunction associated with type 2 diabetes mellitus ([place]) 12/21/2013    Immunization History  Administered  Date(s) Administered   Fluad Quad(high Dose 65+) 04/30/2017, 04/13/2019, 05/15/2022   Influenza Split 04/19/2014, 05/29/2020   Influenza, Seasonal, Injecte, Preservative Fre 05/09/2015   Influenza-Unspecified 05/18/2014, 05/09/2015, 04/22/2018, 05/24/2020, 05/19/2021   Moderna SARS-COV2 Booster Vaccination 12/13/2020   Moderna Sars-Covid-2 Vaccination 09/30/2019, 10/29/2019, 06/24/2020, 12/13/2020   Pneumococcal Conjugate-13 04/23/2018   Pneumococcal Polysaccharide-23 12/28/2015   Zoster Recombinat (Shingrix) 02/12/2022   Zoster, Live 01/04/2016    Conditions to be addressed/monitored: HLD and DMII  There are no care plans that you recently modified to display for this patient.    Medication Assistance:  tresiba, ozempic, farxiga obtained through novo nordisk & az&me medication assistance program.  Enrollment ends 07/2022  Follow Up:  Patient agrees to Care Plan and Follow-up.  Plan: Telephone follow up appointment with care management team member scheduled for:  3 months   Regina Eck, PharmD, BCPS Clinical Pharmacist, [place]  II Phone [phone]

## 2022-09-17 ENCOUNTER — Encounter: Payer: Self-pay | Admitting: Pharmacist

## 2022-09-17 NOTE — Progress Notes (Signed)
Chronic Care Management Pharmacy Note  09/05/2022 Name:  [name] MRN:  [id number] DOB:  [birth year]  Summary:  Diabetes: Uncontrolled (improved); current treatment: TRESIBA 50-60 units daily, metformin; Ozempic 28m weekly INCREASED A1c 9.1%, GFR 88 INCREASE Ozempic TO 1MG weekly  Denies personal and family history of Medullary thyroid cancer (MTC) Submitted novo nordisk application--will take 4-6 weeks to arrive at PCP office HOLD Farxiga Experiencing dizziness, passing out, orthostatic hypotension Has CHF diagnosed last year Continue Tresiba Patient no longer on Novolog meal time insulin with addition of Ozempic Follows with endocrine Current glucose readings: fasting glucose: <130, post prandial glucose: 150-180 Uses libre CGM/[facility] via [employer] portal Denies hypoglycemic/hyperglycemic symptoms Discussed meal planning options and Plate method for healthy eating Avoid sugary drinks and desserts Incorporate balanced protein, non starchy veggies, 1 serving of carbohydrate with each meal Increase water intake Increase physical activity as able Current exercise: n/a Assessed patient finances. Dose change submitted to novo nordisk patient assistance program for 2024; ships to PCP office; Education provided on diet and lifestyle modifications  Hypertension -assisted patient with setting up her BP monitor from [facility] -patient now able to check BP at home -BP stable/at goal <130/80 -continue current regimen  Patient Goals/Self-Care Activities Over the next 90 days, patient will:  - take medications as prescribed check glucose 3 times daily or if symptomatic, document, and provide at future appointments  Follow Up Plan: Telephone follow up appointment with care management team member scheduled for: 3 months   Subjective: Charles SPLITTis an 72y.o. year old male who is a primary patient of Charles Norlander DO.  The patient was referred to the Chronic Care  Management team for assistance with care management needs subsequent to provider initiation of CCM services and plan of care.    Engaged with patient face to face for follow up visit in response to provider referral for CCM services.   Objective:  LABS:    Lab Results  Component Value Date   CREATININE 0.94 08/16/2022   CREATININE 0.93 11/23/2021   CREATININE 1.05 11/06/2021     Lab Results  Component Value Date   HGBA1C 9.1 (H) 08/27/2022         Component Value Date/Time   CHOL 103 06/11/2021 0958   TRIG 98 06/11/2021 0958   HDL 27 (L) 06/11/2021 0958   CHOLHDL 3.8 06/11/2021 0958   LDLCALC 57 06/11/2021 0958     Clinical ASCVD: No   The ASCVD Risk score (Arnett DK, et al., 2019) failed to calculate for the following reasons:   The valid total cholesterol range is 130 to 320 mg/dL    Other: (CHADS2VASc if Afib, PHQ9 if depression, MMRC or CAT for COPD, ACT, DEXA)    BP Readings from Last 3 Encounters:  09/10/22 138/75  09/17/22 132/76  08/27/22 135/77      SDOH:  (Social Determinants of Health) assessments and interventions performed:    Allergies  Allergen Reactions   Farxiga [Dapagliflozin] Other (See Comments)    Dizziness, passed out/orthostasis    Medications Reviewed Today     Reviewed by PLavera Guise [facility](Pharmacist) on 09/17/22 at 1312  Med List Status: <None>   Medication Order Taking? Sig Documenting Provider Last Dose Status Informant  ALPRAZolam (XANAX) 1 MG tablet [id number] Take 0.5-1 tablets (0.5-1 mg total) by mouth 3 (three) times daily as needed for anxiety (take ONLY IF NEEDED). GRonnie DossM, DO Taking Active   amLODipine (NORVASC)  5 MG tablet [id number] No Take 1 tablet (5 mg total) by mouth daily. Ronnie Doss M, DO Taking Active   apixaban (ELIQUIS) 5 MG TABS tablet [id number] No Take 1 tablet (5 mg total) by mouth 2 (two) times daily. Buford Dresser, MD Taking Active   Continuous Blood Gluc Sensor (FREESTYLE  LIBRE 14 DAY SENSOR) [place] [id number] No Scan to check glucose at least 4 times daily. DX: E11.4 Charles Norlander, DO Taking Active Self  dapagliflozin propanediol (FARXIGA) 5 MG TABS tablet [id number] No Take 1 tablet (5 mg total) by mouth daily before breakfast.  Patient not taking: Reported on 09/04/2022   Charles Norlander, DO Not Taking Active            Med Note Lavera Guise   Tue Sep 10, 2022  9:42 AM) HOLDING  famotidine (PEPCID) 20 MG tablet [id number] No Take 1 tablet (20 mg total) by mouth 2 (two) times daily. Ronnie Doss M, DO Taking Active   furosemide (LASIX) 20 MG tablet [id number] No Take 1 tablet (20 mg total) by mouth daily. Buford Dresser, MD Taking Active   glucose blood [facility] VERIO) test strip [id number] No Use as instructed to monitor glucose 4 times daily (use as back up method for CGM) Brita Romp, NP Taking Active Self  insulin degludec (TRESIBA FLEXTOUCH) 200 UNIT/ML FlexTouch Pen [id number] No Inject 50 Units into the skin in the morning. Brita Romp, NP Taking Active            Med Note Lavera Guise   Tue Sep 10, 2022  9:42 AM) Via novo nordisk patient assistance program    Insulin Pen Needle (B-D ULTRAFINE III SHORT PEN) 31G X 8 MM MISC [id number] No Use to give insulin daily Dx E11.9 Baruch Gouty, FNP Taking Active Self  Lancets [facility] ULTRASOFT) lancets [id number] No Use as instructed to monitor glucose 4 times daily (use as back up method for CGM). Brita Romp, NP Taking Active Self  lisinopril (ZESTRIL) 40 MG tablet [id number] No Take 1 tablet (40 mg total) by mouth daily. Ronnie Doss M, DO Taking Active   metFORMIN (GLUCOPHAGE-XR) 500 MG 24 hr tablet [id number] No TAKE 2 TABLETS BY MOUTH TWICE A DAY WITH FOOD Ronnie Doss M, DO Taking Active   mirtazapine (REMERON) 45 MG tablet [id number] No Take 1 tablet (45 mg total) by mouth at bedtime. Ronnie Doss M, DO Taking Active   omeprazole (PRILOSEC) 20 MG  capsule [id number] No Take 20 mg by mouth daily. [provider] Taking Active Self  rosuvastatin (CRESTOR) 20 MG tablet [id number] No TAKE 1 TABLET BY MOUTH EVERYDAY AT BEDTIME Ronnie Doss M, DO Taking Active   Semaglutide,0.25 or 0.5MG/DOS, (OZEMPIC, 0.25 OR 0.5 MG/DOSE,) 2 MG/3ML SOPN [id number] No Inject 0.5 mg into the skin once a week. [provider] Taking Active            Med Note Blanca Friend, Royce Macadamia   Fri Mar 22, 2022  9:53 AM) Via novo nordisk patient assistance program    sildenafil (VIAGRA) 50 MG tablet [id number] No Take 1 tablet (50 mg total) by mouth daily as needed for erectile dysfunction. Charles Norlander, DO Taking Active Self  Med List Note Loetta Rough 03/31/13 1303): Lisinopril (strength)               Goals Addressed               This Visit's Progress  Patient Stated     T2DM, HTN, CHF-PHARMD GOAL (pt-stated)        Current Barriers:  Unable to independently afford treatment regimen Unable to maintain control of T2DM, HTN, CHF  Pharmacist Clinical Goal(s):  Over the next 90 days, patient will verbalize ability to afford treatment regimen maintain control of T2DM as evidenced by [place]  through collaboration with PharmD and provider.    Interventions: 1:1 collaboration with Charles Norlander, DO regarding development and update of comprehensive plan of care as evidenced by provider attestation and co-signature Inter-disciplinary care team collaboration (see longitudinal plan of care) Comprehensive medication review performed; medication list updated in electronic medical record  Diabetes: Uncontrolled (improved); current treatment: TRESIBA 50-60 units daily, metformin; Ozempic 35m weekly A1c 8.1%--> 7.7% INCREASE Ozempic TO 1MG weekly  Denies personal and family history of Medullary thyroid cancer (MTC) Submitted novo nordisk application--will take 4-6 weeks to arrive at PCP office HOLD  Farxiga Experiencing dizziness, passing out, orthostatic hypotension Has CHF diagnosed last year Continue Tresiba Patient no longer on Novolog meal time insulin with addition of Ozempic Follows with endocrine Current glucose readings: fasting glucose: <130, post prandial glucose: 150-180 Uses libre CGM/[facility] via [employer] portal Denies hypoglycemic/hyperglycemic symptoms Discussed meal planning options and Plate method for healthy eating Avoid sugary drinks and desserts Incorporate balanced protein, non starchy veggies, 1 serving of carbohydrate with each meal Increase water intake Increase physical activity as able Current exercise: n/a Assessed patient finances. Dose change submitted to novo nordisk patient assistance program for 2024; ships to PCP office; Education provided on diet and lifestyle modifications  Hypertension -assisted patient with setting up her BP monitor from [facility] -patient now able to check BP at home -BP stable/at goal <130/80 -continue current regimen  Patient Goals/Self-Care Activities Over the next 90 days, patient will:  - take medications as prescribed check glucose 3 times daily or if symptomatic, document, and provide at future appointments  Follow Up Plan: Telephone follow up appointment with care management team member scheduled for: 3 months         Plan: Telephone follow up appointment with care management team member scheduled for:  4 WEEKS     JRegina Eck PharmD, BCPS, BFort ApacheClinical Pharmacist, [place]  T [phone]

## 2022-09-23 DIAGNOSIS — E1140 Type 2 diabetes mellitus with diabetic neuropathy, unspecified: Secondary | ICD-10-CM | POA: Diagnosis not present

## 2022-09-23 DIAGNOSIS — Z794 Long term (current) use of insulin: Secondary | ICD-10-CM | POA: Diagnosis not present

## 2022-09-26 ENCOUNTER — Ambulatory Visit: Payer: Medicare Other | Admitting: Pharmacist

## 2022-09-26 VITALS — BP 141/81 | HR 75

## 2022-09-26 DIAGNOSIS — E1140 Type 2 diabetes mellitus with diabetic neuropathy, unspecified: Secondary | ICD-10-CM

## 2022-09-26 DIAGNOSIS — E1159 Type 2 diabetes mellitus with other circulatory complications: Secondary | ICD-10-CM

## 2022-09-26 NOTE — Progress Notes (Signed)
Chronic Care Management Pharmacy Note  09/26/2022 Name:  [name] MRN:  [id number] DOB:  11-26-1950  Summary:   Diabetes: Uncontrolled (improved); current treatment: TRESIBA 50-60 units daily, metformin; Ozempic '1mg'$  weekly A1c 7.7--9.1% INCREASE Ozempic TO '1MG'$  weekly (likely increase to '2mg'$ ) Denies personal and family history of Medullary thyroid cancer (MTC) Submitted dose change to novo nordisk application--will take [id number] weeks to arrive at PCP office Continue to [facility] Experiencing dizziness, passing out, orthostatic hypotension Has CHF diagnosed last year Continue Tresiba Patient no longer on Novolog meal time insulin with addition of Ozempic Follows with endocrine Current glucose readings: fasting glucose: <130, post prandial glucose: 150-180 Uses libre 14 day CGM/[facility] via [employer] portal Denies hypoglycemic/hyperglycemic symptoms Discussed meal planning options and Plate method for healthy eating Avoid sugary drinks and desserts Incorporate balanced protein, non starchy veggies, 1 serving of carbohydrate with each meal Increase water intake Increase physical activity as able Current exercise: n/a Assessed patient finances. Dose change submitted to novo nordisk patient assistance program for 2024; ships to PCP office; Education provided on diet and lifestyle modifications  Hypertension -assisted patient with setting up her BP monitor from [facility] -patient now able to check BP at home -BP stable/at goal <130/80 -continue current regimen  Patient Goals/Self-Care Activities Over the next 90 days, patient will:  - take medications as prescribed check glucose 3 times daily or if symptomatic, document, and provide at future appointments  Follow Up Plan: Telephone follow up appointment with care management team member scheduled for: 3 months   Subjective: [name] is an 72 y.o. year old male who is a primary patient of [name], DO.   The patient was referred to the Chronic Care Management team for assistance with care management needs subsequent to provider initiation of CCM services and plan of care.    Engaged with patient face to face for follow up visit in response to provider referral for CCM services.   Objective:  LABS:  Lab Results  Component Value Date   CREATININE 0.94 08/16/2022   CREATININE 0.93 11/23/2021   CREATININE 1.05 11/06/2021     Lab Results  Component Value Date   HGBA1C 9.1 (H) 08/27/2022         Component Value Date/Time   CHOL 103 06/11/2021 0958   TRIG 98 06/11/2021 0958   HDL 27 (L) 06/11/2021 0958   CHOLHDL 3.8 06/11/2021 0958   LDLCALC 57 06/11/2021 0958     Clinical ASCVD: No   The ASCVD Risk score (Arnett DK, et al., 2019) failed to calculate for the following reasons:   The valid total cholesterol range is 130 to 320 mg/dL    Other: (CHADS2VASc if Afib, PHQ9 if depression, MMRC or CAT for COPD, ACT, DEXA)    BP Readings from Last 3 Encounters:  09/27/22 (!) 141/81  09/10/22 138/75  09/17/22 132/76      SDOH:  (Social Determinants of Health) assessments and interventions performed:    Allergies  Allergen Reactions   Farxiga [Dapagliflozin] Other (See Comments)    Dizziness, passed out/orthostasis    Medications Reviewed Today     Reviewed by Lavera Guise, [facility] (Pharmacist) on 10/11/22 at [place] List Status: <None>   Medication Order Taking? Sig Documenting Provider Last Dose Status Informant  ALPRAZolam (XANAX) 1 MG tablet [id number] No Take 0.5-1 tablets (0.5-1 mg total) by mouth 3 (three) times daily as needed for anxiety (take ONLY IF NEEDED). Ronnie Doss M,  DO Taking Active   amLODipine (NORVASC) 5 MG tablet [id number] No Take 1 tablet (5 mg total) by mouth daily. Ronnie Doss M, DO Taking Active   apixaban (ELIQUIS) 5 MG TABS tablet [id number] No Take 1 tablet (5 mg total) by mouth 2 (two) times daily. Buford Dresser, MD Taking Active    Continuous Blood Gluc Sensor (FREESTYLE LIBRE 14 DAY SENSOR) [place] [id number] No Scan to check glucose at least 4 times daily. DX: E11.4 [name], DO Taking Active Self  dapagliflozin propanediol (FARXIGA) 5 MG TABS tablet [id number] No Take 1 tablet (5 mg total) by mouth daily before breakfast.  Patient not taking: Reported on 09/04/2022   [name], DO Not Taking Active            Med Note Lavera Guise   Tue Sep 10, 2022  9:42 AM) HOLDING  famotidine (PEPCID) 20 MG tablet [id number] No Take 1 tablet (20 mg total) by mouth 2 (two) times daily. Ronnie Doss M, DO Taking Active   furosemide (LASIX) 20 MG tablet [id number] No Take 1 tablet (20 mg total) by mouth daily. Buford Dresser, MD Taking Active   glucose blood [facility] VERIO) test strip [id number] No Use as instructed to monitor glucose 4 times daily (use as back up method for CGM) Brita Romp, NP Taking Active Self  insulin degludec (TRESIBA FLEXTOUCH) 200 UNIT/ML FlexTouch Pen [id number] No Inject 50 Units into the skin in the morning. Brita Romp, NP Taking Active            Med Note Lavera Guise   Tue Sep 10, 2022  9:42 AM) Via novo nordisk patient assistance program    Insulin Pen Needle (B-D ULTRAFINE III SHORT PEN) 31G X 8 MM MISC [id number] No Use to give insulin daily Dx E11.9 Baruch Gouty, FNP Taking Active Self  Lancets [facility] ULTRASOFT) lancets [id number] No Use as instructed to monitor glucose 4 times daily (use as back up method for CGM). Brita Romp, NP Taking Active Self  lisinopril (ZESTRIL) 40 MG tablet [id number] No Take 1 tablet (40 mg total) by mouth daily. Ronnie Doss M, DO Taking Active   metFORMIN (GLUCOPHAGE-XR) 500 MG 24 hr tablet [id number] No TAKE 2 TABLETS BY MOUTH TWICE A DAY WITH FOOD Ronnie Doss M, DO Taking Active   mirtazapine (REMERON) 45 MG tablet [id number] No Take 1 tablet (45 mg total) by mouth at bedtime. Ronnie Doss M, DO Taking  Active   omeprazole (PRILOSEC) 20 MG capsule [id number] No Take 20 mg by mouth daily. [provider] Taking Active Self  rosuvastatin (CRESTOR) 20 MG tablet [id number] No TAKE 1 TABLET BY MOUTH EVERYDAY AT BEDTIME Ronnie Doss M, DO Taking Active   Semaglutide,0.25 or 0.'5MG'$ /DOS, (OZEMPIC, 0.25 OR 0.5 MG/DOSE,) 2 MG/3ML SOPN [id number] No Inject 1 mg into the skin once a week. [provider] Taking Active            Med Note Blanca Friend, Royce Macadamia   Fri Mar 22, 2022  9:53 AM) Via novo nordisk patient assistance program    sildenafil (VIAGRA) 50 MG tablet [id number] No Take 1 tablet (50 mg total) by mouth daily as needed for erectile dysfunction. [name], DO Taking Active Self  Med List Note Loetta Rough 03/31/13 1303): Lisinopril (strength)               Goals Addressed  This Visit's Progress     Patient Stated     T2DM, HTN, CHF-PHARMD GOAL (pt-stated)        Current Barriers:  Unable to independently afford treatment regimen Unable to maintain control of T2DM, HTN, CHF  Pharmacist Clinical Goal(s):  Over the next 90 days, patient will verbalize ability to afford treatment regimen maintain control of T2DM as evidenced by [place]  through collaboration with PharmD and provider.    Interventions: 1:1 collaboration with [name], DO regarding development and update of comprehensive plan of care as evidenced by provider attestation and co-signature Inter-disciplinary care team collaboration (see longitudinal plan of care) Comprehensive medication review performed; medication list updated in electronic medical record  Diabetes: Uncontrolled (improved); current treatment: TRESIBA 50-60 units daily, metformin; Ozempic '1mg'$  weekly A1c 7.7--9.1% INCREASE Ozempic TO '1MG'$  weekly (likely increase to '2mg'$ ) Denies personal and family history of Medullary thyroid cancer (MTC) Submitted dose change to novo  nordisk application--will take [id number] weeks to arrive at PCP office Continue to [facility] Experiencing dizziness, passing out, orthostatic hypotension Has CHF diagnosed last year Continue Tresiba Patient no longer on Novolog meal time insulin with addition of Ozempic Follows with endocrine Current glucose readings: fasting glucose: <130, post prandial glucose: 150-180 Uses libre 14 day CGM/[facility] via [employer] portal Denies hypoglycemic/hyperglycemic symptoms Discussed meal planning options and Plate method for healthy eating Avoid sugary drinks and desserts Incorporate balanced protein, non starchy veggies, 1 serving of carbohydrate with each meal Increase water intake Increase physical activity as able Current exercise: n/a Assessed patient finances. Dose change submitted to novo nordisk patient assistance program for 2024; ships to PCP office; Education provided on diet and lifestyle modifications  Hypertension -assisted patient with setting up her BP monitor from [facility] -patient now able to check BP at home -BP stable/at goal <130/80 -continue current regimen  Patient Goals/Self-Care Activities Over the next 90 days, patient will:  - take medications as prescribed check glucose 3 times daily or if symptomatic, document, and provide at future appointments  Follow Up Plan: Telephone follow up appointment with care management team member scheduled for: 3 months         Plan: Telephone follow up appointment with care management team member scheduled for:  4 weeks     Regina Eck, PharmD, [place] Clinical Pharmacist, [place]

## 2022-10-03 DIAGNOSIS — Z794 Long term (current) use of insulin: Secondary | ICD-10-CM

## 2022-10-03 DIAGNOSIS — E1159 Type 2 diabetes mellitus with other circulatory complications: Secondary | ICD-10-CM

## 2022-10-03 DIAGNOSIS — I152 Hypertension secondary to endocrine disorders: Secondary | ICD-10-CM

## 2022-10-03 DIAGNOSIS — E1140 Type 2 diabetes mellitus with diabetic neuropathy, unspecified: Secondary | ICD-10-CM

## 2022-10-17 ENCOUNTER — Ambulatory Visit: Payer: Medicare Other | Admitting: Nurse Practitioner

## 2022-10-17 ENCOUNTER — Encounter: Payer: Self-pay | Admitting: Nurse Practitioner

## 2022-10-17 VITALS — BP 127/77 | HR 58 | Ht 72.0 in | Wt 280.2 lb

## 2022-10-17 DIAGNOSIS — I10 Essential (primary) hypertension: Secondary | ICD-10-CM | POA: Diagnosis not present

## 2022-10-17 DIAGNOSIS — Z794 Long term (current) use of insulin: Secondary | ICD-10-CM

## 2022-10-17 DIAGNOSIS — E782 Mixed hyperlipidemia: Secondary | ICD-10-CM

## 2022-10-17 DIAGNOSIS — E1122 Type 2 diabetes mellitus with diabetic chronic kidney disease: Secondary | ICD-10-CM | POA: Diagnosis not present

## 2022-10-17 DIAGNOSIS — N182 Chronic kidney disease, stage 2 (mild): Secondary | ICD-10-CM

## 2022-10-17 NOTE — Progress Notes (Signed)
Endocrinology Follow Up Note       10/17/2022, 1:21 PM   Subjective:    Patient ID: JAZIYAH FAUBLE, male    DOB: 28-Aug-1950.  [name] is being seen in follow up after being seen in consultation for management of currently uncontrolled symptomatic diabetes requested by  [name], DO.   Past Medical History:  Diagnosis Date   Anxiety    Arthritis    Diabetes mellitus without complication (HCC)    GERD (gastroesophageal reflux disease)    Hypercholesteremia    Hypertension     Past Surgical History:  Procedure Laterality Date   boils     removed form back of skull   CATARACT EXTRACTION W/PHACO Right 04/02/2013   Procedure: CATARACT EXTRACTION PHACO AND INTRAOCULAR LENS PLACEMENT ([place]);  Surgeon: Williams Che, MD;  Location: [facility];  Service: Ophthalmology;  Laterality: Right;  CDE 7.00   CATARACT EXTRACTION W/PHACO Left 10/04/2013   Procedure: CATARACT EXTRACTION PHACO AND INTRAOCULAR LENS PLACEMENT ([facility]);  Surgeon: Williams Che, MD;  Location: [facility];  Service: Ophthalmology;  Laterality: Left;  CDE:1.46   COLONOSCOPY N/A 07/03/2015   Procedure: COLONOSCOPY;  Surgeon: Rogene Houston, MD;  Location: [facility];  Service: Endoscopy;  Laterality: N/A;  830   EYE SURGERY Right    "valve replaced and stent placed" per pt to reroute the fluid in his eye   ROTATOR CUFF REPAIR Right     Social History   Socioeconomic History   Marital status: Divorced    Spouse name: Not on file   Number of children: Not on file   Years of education: Not on file   Highest education level: Not on file  Occupational History   Not on file  Tobacco Use   Smoking status: Former    Packs/day: 0.50    Years: 41.00    Additional pack years: 0.00    Total pack years: 20.50    Types: Cigarettes    Quit date: 02/29/2012    Years since quitting: 10.6   Smokeless tobacco: Never  Vaping Use   Vaping Use:  Never used  Substance and Sexual Activity   Alcohol use: No    Comment: quit 2012   Drug use: No    Comment: quit in 2012   Sexual activity: Yes    Birth control/protection: None  Other Topics Concern   Not on file  Social History Narrative   Not on file   Social Determinants of Health   Financial Resource Strain: Low Risk  (72/31/2024)   Overall Financial Resource Strain (CARDIA)    Difficulty of Paying Living Expenses: Not hard at all  Food Insecurity: No Food Insecurity (09/04/2022)   Hunger Vital Sign    Worried About Running Out of Food in the Last Year: Never true    Ran Out of Food in the Last Year: Never true  Transportation Needs: No Transportation Needs (09/04/2022)   PRAPARE - Hydrologist (Medical): No    Lack of Transportation (Non-Medical): No  Physical Activity: Inactive (09/04/2022)   Exercise Vital Sign    Days of Exercise per Week: 0  days    Minutes of Exercise per Session: 0 min  Stress: No Stress Concern Present (09/04/2022)   [place]    Feeling of Stress : Not at all  Social Connections: Moderately Integrated (09/04/2022)   Social Connection and Isolation Panel [NHANES]    Frequency of Communication with Friends and Family: More than three times a week    Frequency of Social Gatherings with Friends and Family: More than three times a week    Attends Religious Services: More than 4 times per year    Active Member of [employer] or Organizations: Yes    Attends Music therapist: More than 4 times per year    Marital Status: Divorced    Family History  Problem Relation Age of Onset   Diabetes Mother    Glaucoma Mother     Outpatient Encounter Medications as of 10/17/2022  Medication Sig   ALPRAZolam (XANAX) 1 MG tablet Take 0.5-1 tablets (0.5-1 mg total) by mouth 3 (three) times daily as needed for anxiety (take ONLY IF NEEDED).   amLODipine (NORVASC)  5 MG tablet Take 1 tablet (5 mg total) by mouth daily.   apixaban (ELIQUIS) 5 MG TABS tablet Take 1 tablet (5 mg total) by mouth 2 (two) times daily.   Continuous Blood Gluc Sensor (FREESTYLE LIBRE 14 DAY SENSOR) MISC Scan to check glucose at least 4 times daily. DX: E11.4   famotidine (PEPCID) 20 MG tablet Take 1 tablet (20 mg total) by mouth 2 (two) times daily.   furosemide (LASIX) 20 MG tablet Take 1 tablet (20 mg total) by mouth daily.   glucose blood (ONETOUCH VERIO) test strip Use as instructed to monitor glucose 4 times daily (use as back up method for CGM)   insulin degludec (TRESIBA FLEXTOUCH) 200 UNIT/ML FlexTouch Pen Inject 50 Units into the skin in the morning.   Insulin Pen Needle (B-D ULTRAFINE III SHORT PEN) 31G X 8 MM MISC Use to give insulin daily Dx E11.9   lisinopril (ZESTRIL) 40 MG tablet Take 1 tablet (40 mg total) by mouth daily.   metFORMIN (GLUCOPHAGE-XR) 500 MG 24 hr tablet TAKE 2 TABLETS BY MOUTH TWICE A DAY WITH FOOD   mirtazapine (REMERON) 45 MG tablet Take 1 tablet (45 mg total) by mouth at bedtime.   omeprazole (PRILOSEC) 20 MG capsule Take 20 mg by mouth daily.   rosuvastatin (CRESTOR) 20 MG tablet TAKE 1 TABLET BY MOUTH EVERYDAY AT BEDTIME   Semaglutide, 1 MG/DOSE, (OZEMPIC, 1 MG/DOSE,) 2 MG/1.5ML SOPN Inject 1 mg into the skin once a week. Obtains this from PAP Lottie Dawson, [facility] assisting with this)   sildenafil (VIAGRA) 50 MG tablet Take 1 tablet (50 mg total) by mouth daily as needed for erectile dysfunction.   [DISCONTINUED] Semaglutide,0.25 or 0.'5MG'$ /DOS, (OZEMPIC, 0.25 OR 0.5 MG/DOSE,) 2 MG/3ML SOPN Inject 1 mg into the skin once a week.   Lancets (ONETOUCH ULTRASOFT) lancets Use as instructed to monitor glucose 4 times daily (use as back up method for CGM).   [DISCONTINUED] dapagliflozin propanediol (FARXIGA) 5 MG TABS tablet Take 1 tablet (5 mg total) by mouth daily before breakfast. (Patient not taking: Reported on 09/04/2022)   No facility-administered  encounter medications on file as of 10/17/2022.    ALLERGIES: Allergies  Allergen Reactions   Farxiga [Dapagliflozin] Other (See Comments)    Dizziness, passed out/orthostasis    VACCINATION STATUS: Immunization History  Administered Date(s) Administered   Fluad Quad(high Dose  72+) 04/30/2017, 04/13/2019, 05/15/2022   Influenza Split 04/19/2014, 05/29/2020   Influenza, Seasonal, Injecte, Preservative Fre 05/09/2015   Influenza-Unspecified 05/18/2014, 05/09/2015, 04/22/2018, 05/24/2020, 05/19/2021   Moderna SARS-COV2 Booster Vaccination 12/13/2020   Moderna Sars-Covid-2 Vaccination 09/30/2019, 10/29/2019, 06/24/2020, 12/13/2020   Pneumococcal Conjugate-13 04/23/2018   Pneumococcal Polysaccharide-23 12/28/2015   Zoster Recombinat (Shingrix) 02/12/2022   Zoster, Live 01/04/2016    Diabetes He presents for his follow-up diabetic visit. He has type 2 diabetes mellitus. Onset time: diagnosed at approx age of 31. His disease course has been improving. There are no hypoglycemic associated symptoms. Associated symptoms include foot paresthesias. Pertinent negatives for diabetes include no blurred vision, no fatigue, no polydipsia, no polyuria, no visual change, no weakness and no weight loss. There are no hypoglycemic complications. Symptoms are improving. Diabetic complications include heart disease, impotence, nephropathy and retinopathy. Risk factors for coronary artery disease include diabetes mellitus, dyslipidemia, family history, hypertension, male sex, obesity, sedentary lifestyle and tobacco exposure. Current diabetic treatment includes insulin injections and oral agent (monotherapy) (and Ozempic). He is compliant with treatment most of the time. His weight is decreasing steadily. He is following a generally healthy diet. When asked about meal planning, he reported none. He has not had a previous visit with a dietitian. He rarely participates in exercise. His home blood glucose trend is  decreasing steadily. His overall blood glucose range is 140-180 mg/dl. (He presents today with his CGM showing inconsistent glucose monitoring (says the company sent him the wrong sensors) and at target glycemic profile overall.  He was not due for another A1c today, recently checked by his PCP on 1/23 was 9.1%, essentially unchanged.  He did have fall between visits, attributed to dizziness, and was taken of [place] which has diuretic properties.  He notes his dizziness has improved somewhat.  Still notices he gets "swimmy headed" sometimes when changing positions in the bed.) An ACE inhibitor/angiotensin II receptor blocker is being taken. He does not see a podiatrist.Eye exam is current.  Hyperlipidemia This is a chronic problem. The current episode started more than 1 year ago. The problem is controlled. Recent lipid tests were reviewed and are normal. Exacerbating diseases include chronic renal disease, diabetes and obesity. Factors aggravating his hyperlipidemia include fatty foods. Current antihyperlipidemic treatment includes statins. The current treatment provides moderate improvement of lipids. Compliance problems include adherence to diet and adherence to exercise.  Risk factors for coronary artery disease include diabetes mellitus, dyslipidemia, family history, male sex, hypertension, obesity and a sedentary lifestyle.  Hypertension This is a chronic problem. The current episode started more than 1 year ago. The problem has been resolved since onset. The problem is controlled. Pertinent negatives include no blurred vision. There are no associated agents to hypertension. Risk factors for coronary artery disease include diabetes mellitus, dyslipidemia, family history, male gender, obesity, sedentary lifestyle and smoking/tobacco exposure. Past treatments include ACE inhibitors and calcium channel blockers. The current treatment provides moderate improvement. There are no compliance problems.   Hypertensive end-organ damage includes kidney disease, CAD/MI and retinopathy. Identifiable causes of hypertension include chronic renal disease.    Review of systems  Constitutional: + steadily decreasing body weight,  current Body mass index is 38 kg/m., no fatigue, no subjective hyperthermia, no subjective hypothermia Eyes: + blurry vision (bilateral retinopathy with recent surgery on both eyes), legally blind in right eye, no xerophthalmia ENT: no sore throat, no nodules palpated in throat, no dysphagia/odynophagia, no hoarseness Cardiovascular: no chest pain, no shortness of breath, no palpitations,  no leg swelling Respiratory: no cough, no shortness of breath Gastrointestinal: no nausea/vomiting/diarrhea Musculoskeletal: no muscle/joint aches, walks with cane for deconditioning Skin: no rashes, no hyperemia Neurological: no tremors, no numbness, no tingling, + dizziness- intermittent but improving Psychiatric: no depression, no anxiety   Objective:     BP 127/77 (BP Location: Left Arm, Patient Position: Sitting, Cuff Size: Large)   Pulse (!) 58   Ht 6' (1.829 m)   Wt 280 lb 3.2 oz (127.1 kg)   BMI 38.00 kg/m   Wt Readings from Last 3 Encounters:  10/17/22 280 lb 3.2 oz (127.1 kg)  09/04/22 282 lb (127.9 kg)  08/27/22 282 lb (127.9 kg)     BP Readings from Last 3 Encounters:  10/17/22 127/77  09/27/22 (!) 141/81  09/10/22 138/75     Physical Exam- Limited  Constitutional:  Body mass index is 38 kg/m. , not in acute distress, normal state of mind Eyes:  EOMI, no exophthalmos, right eye opaque conjunctiva Neck: Supple Musculoskeletal: no gross deformities, strength intact in all four extremities, no gross restriction of joint movements, walks with cane for deconditioning Skin:  no rashes, no hyperemia Neurological: no tremor with outstretched hands    CMP ( most recent) CMP     Component Value Date/Time   NA 139 08/16/2022 1020   NA 145 (H) 11/23/2021 0933    K 3.6 08/16/2022 1020   CL 102 08/16/2022 1020   CO2 26 08/16/2022 1020   GLUCOSE 152 (H) 08/16/2022 1020   BUN 13 08/16/2022 1020   BUN 21 11/23/2021 0933   CREATININE 0.94 08/16/2022 1020   CALCIUM 9.0 08/16/2022 1020   PROT 8.4 (H) 08/16/2022 1020   PROT 7.4 06/11/2021 0958   ALBUMIN 3.7 08/16/2022 1020   ALBUMIN 4.1 06/11/2021 0958   AST 26 08/16/2022 1020   ALT 20 08/16/2022 1020   ALKPHOS 82 08/16/2022 1020   BILITOT 0.7 08/16/2022 1020   BILITOT 0.7 06/11/2021 0958   GFRNONAA >60 08/16/2022 1020   GFRAA 92 04/13/2020 0906     Diabetic Labs (most recent): Lab Results  Component Value Date   HGBA1C 9.1 (H) 08/27/2022   HGBA1C 9.2 (H) 05/15/2022   HGBA1C 7.7 (H) 02/12/2022   MICROALBUR 150 12/05/2020     Lipid Panel ( most recent) Lipid Panel     Component Value Date/Time   CHOL 103 06/11/2021 0958   TRIG 98 06/11/2021 0958   HDL 27 (L) 06/11/2021 0958   CHOLHDL 3.8 06/11/2021 0958   LDLCALC 57 06/11/2021 0958   LABVLDL 19 06/11/2021 0958      Lab Results  Component Value Date   TSH 0.781 08/16/2022   TSH 0.663 10/29/2021   TSH 0.280 (L) 07/05/2019   TSH 0.124 (L) 04/28/2019   TSH 0.059 (L) 04/06/2019           Assessment & Plan:   1) Uncontrolled Type 2 Diabetes with long-term use of insulin  - [name] has currently uncontrolled symptomatic type 2 DM since 72 years of age.   He presents today with his CGM showing inconsistent glucose monitoring (says the company sent him the wrong sensors) and at target glycemic profile overall.  He was not due for another A1c today, recently checked by his PCP on 1/23 was 9.1%, essentially unchanged.  He did have fall between visits, attributed to dizziness, and was taken of [place] which has diuretic properties.  He notes his dizziness has improved somewhat.  Still notices he  gets "swimmy headed" sometimes when changing positions in the bed.  -Recent labs reviewed.  - I had a long discussion with him  about the progressive nature of diabetes and the pathology behind its complications. -his diabetes is complicated by CKD, CAD and he remains at a high risk for more acute and chronic complications which include CAD, CVA, CKD, retinopathy, and neuropathy. These are all discussed in detail with him.  - Nutritional counseling repeated at each appointment due to patients tendency to fall back in to old habits.  - The patient admits there is a room for improvement in their diet and drink choices. -  Suggestion is made for the patient to avoid simple carbohydrates from their diet including Cakes, Sweet Desserts / Pastries, Ice Cream, Soda (diet and regular), Sweet Tea, Candies, Chips, Cookies, Sweet Pastries, Store Bought Juices, Alcohol in Excess of 1-2 drinks a day, Artificial Sweeteners, Coffee Creamer, and "Sugar-free" Products. This will help patient to have stable blood glucose profile and potentially avoid unintended weight gain.   - I encouraged the patient to switch to unprocessed or minimally processed complex starch and increased protein intake (animal or plant source), fruits, and vegetables.   - Patient is advised to stick to a routine mealtimes to eat 3 meals a day and avoid unnecessary snacks (to snack only to correct hypoglycemia).  - I have approached him with the following individualized plan to manage  his diabetes and patient agrees:   -Given his improved, at goal glycemic profile overall, no changes will be made to his medications today.  He is advised to continue his Tresiba 50 units SQ nightly, Ozempic 1 mg SQ weekly (once received by PAP), and Metformin 500 mg ER twice daily with meals.   -he is encouraged to continue using his CGM to monitor glucose 4 times daily, before meals and before bed, and to call the clinic if he has readings less than 70 or greater than 300 for 3 tests in a row.  - he is warned not to take insulin without proper monitoring per orders. - Adjustment  parameters are given to him for hypo and hyperglycemia in writing.  - Specific targets for  A1c;  LDL, HDL,  and Triglycerides were discussed with the patient.  2) Blood Pressure /Hypertension:  his blood pressure is controlled to target for his age.   he is advised to continue his current medications including Norvasc 5 mg po daily and Lisinopril 40 mg p.o. daily with breakfast.  3) Lipids/Hyperlipidemia:    Review of his recent lipid panel from 06/11/21 showed controlled LDL at 57 .  he  is advised to continue Crestor 20 mg daily at bedtime.  Side effects and precautions discussed with him.    4)  Weight/Diet:  his Body mass index is 38 kg/m.  -  clearly complicating his diabetes care.   he is a candidate for weight loss. I discussed with him the fact that loss of 5 - 10% of his  current body weight will have the most impact on his diabetes management.  Exercise, and detailed carbohydrates information provided  -  detailed on discharge instructions.  5) Chronic Care/Health Maintenance: -he is on ACEI/ARB and Statin medications and is encouraged to initiate and continue to follow up with Ophthalmology, Dentist, Podiatrist at least yearly or according to recommendations, and advised to stay away from smoking. I have recommended yearly flu vaccine and pneumonia vaccine at least every 5 years; moderate intensity exercise for up  to 150 minutes weekly; and sleep for at least 7 hours a day.  - he is advised to maintain close follow up with [name], DO for primary care needs, as well as his other providers for optimal and coordinated care.      I spent  30  minutes in the care of the patient today including review of labs from [facility], Lipids, Thyroid Function, Hematology (current and previous including abstractions from other facilities); face-to-face time discussing  his blood glucose readings/logs, discussing hypoglycemia and hyperglycemia episodes and symptoms, medications doses, his  options of short and long term treatment based on the latest standards of care / guidelines;  discussion about incorporating lifestyle medicine;  and documenting the encounter. Risk reduction counseling performed per USPSTF guidelines to reduce obesity and cardiovascular risk factors.     Please refer to Patient Instructions for Blood Glucose Monitoring and Insulin/Medications Dosing Guide"  in media tab for additional information. Please  also refer to " Patient Self Inventory" in the Media  tab for reviewed elements of pertinent patient history.  [name] participated in the discussions, expressed understanding, and voiced agreement with the above plans.  All questions were answered to his satisfaction. he is encouraged to contact clinic should he have any questions or concerns prior to his return visit.   Follow up plan: - Return in about 4 months (around 02/16/2023) for Diabetes F/U with A1c in office, No previsit labs, Bring meter and logs.  Rayetta Pigg, [facility] [address] [place], [place] [address] Phone: [phone] Fax: [phone]  10/17/2022, 1:21 PM

## 2022-10-23 ENCOUNTER — Telehealth: Payer: Self-pay | Admitting: Family Medicine

## 2022-10-23 NOTE — Telephone Encounter (Signed)
I tried to call patient and let him know that his patient assistance medication is here to pick up.

## 2022-10-23 NOTE — Telephone Encounter (Signed)
Patient aware that patient assistance medication has been placed in refrigerator for pick up.

## 2022-10-24 ENCOUNTER — Ambulatory Visit: Payer: Self-pay

## 2022-10-24 NOTE — Progress Notes (Signed)
Spoke with patient, he was listed on our list for hypertension control.  He did check his blood pressure at home and it was 124/76.  This has been documented in vital signs.

## 2022-10-25 ENCOUNTER — Ambulatory Visit (INDEPENDENT_AMBULATORY_CARE_PROVIDER_SITE_OTHER): Payer: Medicare Other | Admitting: Pharmacist

## 2022-10-25 VITALS — BP 130/75 | HR 78

## 2022-10-25 DIAGNOSIS — I2510 Atherosclerotic heart disease of native coronary artery without angina pectoris: Secondary | ICD-10-CM

## 2022-10-25 DIAGNOSIS — I152 Hypertension secondary to endocrine disorders: Secondary | ICD-10-CM

## 2022-10-29 DIAGNOSIS — B351 Tinea unguium: Secondary | ICD-10-CM | POA: Diagnosis not present

## 2022-10-29 DIAGNOSIS — M79676 Pain in unspecified toe(s): Secondary | ICD-10-CM | POA: Diagnosis not present

## 2022-11-03 DIAGNOSIS — I2510 Atherosclerotic heart disease of native coronary artery without angina pectoris: Secondary | ICD-10-CM

## 2022-11-03 DIAGNOSIS — I2583 Coronary atherosclerosis due to lipid rich plaque: Secondary | ICD-10-CM

## 2022-11-03 DIAGNOSIS — I152 Hypertension secondary to endocrine disorders: Secondary | ICD-10-CM

## 2022-11-03 DIAGNOSIS — E1159 Type 2 diabetes mellitus with other circulatory complications: Secondary | ICD-10-CM

## 2022-11-06 NOTE — Progress Notes (Signed)
Chronic Care Management Pharmacy Note   Name:  [name] MRN:  [id number] DOB:  1951/06/03  Summary:  Diabetes: Uncontrolled (improved); current treatment: TRESIBA 50-60 units daily, metformin; Ozempic 1mg  weekly A1c 7.7--9.1% Continue Ozempic TO 1MG  weekly (likely increase to 2mg ) Denies personal and family history of Medullary thyroid cancer (MTC) Glucose improved on 1mg  dose; will recheck A1c at next visit Continue to [place] Experiencing dizziness, passing out, orthostatic hypotension Has CHF diagnosed last year Continue Tresiba 50 units daily Continue metformin Patient no longer on Novolog meal time insulin with addition of Ozempic Follows with endocrine Current glucose readings: fasting glucose: <130, post prandial glucose: 150-180 Uses libre 14 day CGM/[facility] via [employer] portal (will transition to [place] 3 in the near future as 14 day libre may phase out) Denies hypoglycemic/hyperglycemic symptoms Discussed meal planning options and Plate method for healthy eating Avoid sugary drinks and desserts Incorporate balanced protein, non starchy veggies, 1 serving of carbohydrate with each meal Increase water intake Increase physical activity as able Current exercise: n/a Assessed patient finances. Dose change submitted to novo nordisk patient assistance program for 2024; ships to PCP office; Education provided on diet and lifestyle modifications  Hypertension -patient using [employer] BP monitor at home, but states it's hard for him to get on his arm -BP stable/at goal <130/80 -continue current regimen  Patient Goals/Self-Care Activities Over the next 90 days, patient will:  - take medications as prescribed check glucose 3 times daily or if symptomatic, document, and provide at future appointments  Follow Up Plan: Telephone follow up appointment with care management team member scheduled for: 3 months   Subjective: [name] is an 72 y.o. year old male  who is a primary patient of [name], DO.  The patient was referred to the Chronic Care Management team for assistance with care management needs subsequent to provider initiation of CCM services and plan of care.    Engaged with patient face to face for follow up visit in response to provider referral for CCM services.   Objective:  LABS:    Lab Results  Component Value Date   CREATININE 0.94 08/16/2022   CREATININE 0.93 11/23/2021   CREATININE 1.05 11/06/2021     Lab Results  Component Value Date   HGBA1C 9.1 (H) 08/27/2022         Component Value Date/Time   CHOL 103 06/11/2021 0958   TRIG 98 06/11/2021 0958   HDL 27 (L) 06/11/2021 0958   CHOLHDL 3.8 06/11/2021 0958   LDLCALC 57 06/11/2021 0958     Clinical ASCVD: No   The ASCVD Risk score (Arnett DK, et al., 2019) failed to calculate for the following reasons:   The valid total cholesterol range is 130 to 320 mg/dL    Other: (CHADS2VASc if Afib, PHQ9 if depression, MMRC or CAT for COPD, ACT, DEXA)    BP Readings from Last 3 Encounters:  11/06/22 130/75  10/24/22 124/76  10/17/22 127/77      SDOH:  (Social Determinants of Health) assessments and interventions performed:    Allergies  Allergen Reactions   Farxiga [Dapagliflozin] Other (See Comments)    Dizziness, passed out/orthostasis    Medications Reviewed Today     Reviewed by Lavera Guise, [facility] (Pharmacist) on 11/06/22 at 1155  Med List Status: <None>   Medication Order Taking? Sig Documenting Provider Last Dose Status Informant  ALPRAZolam (XANAX) 1 MG tablet [id number] No Take 0.5-1 tablets (0.5-1 mg total)  by mouth 3 (three) times daily as needed for anxiety (take ONLY IF NEEDED). Ronnie Doss M, DO Taking Active   amLODipine (NORVASC) 5 MG tablet [id number] No Take 1 tablet (5 mg total) by mouth daily. Ronnie Doss M, DO Taking Active   apixaban (ELIQUIS) 5 MG TABS tablet [id number] No Take 1 tablet (5 mg total) by mouth 2 (two)  times daily. Buford Dresser, MD Taking Active   Continuous Blood Gluc Sensor (FREESTYLE LIBRE 14 DAY SENSOR) [place] [id number] No Scan to check glucose at least 4 times daily. DX: E11.4 [name], DO Taking Active Self  famotidine (PEPCID) 20 MG tablet [id number] No Take 1 tablet (20 mg total) by mouth 2 (two) times daily. Ronnie Doss M, DO Taking Active   furosemide (LASIX) 20 MG tablet [id number] No Take 1 tablet (20 mg total) by mouth daily. Buford Dresser, MD Taking Active   glucose blood [facility] VERIO) test strip [id number] No Use as instructed to monitor glucose 4 times daily (use as back up method for CGM) Brita Romp, NP Taking Active Self  insulin degludec (TRESIBA FLEXTOUCH) 200 UNIT/ML FlexTouch Pen [id number] No Inject 50 Units into the skin in the morning. Brita Romp, NP Taking Active            Med Note Lavera Guise   Tue Sep 10, 2022  9:42 AM) Via [employer] nord[employer] patient assistance program    Insulin Pen Needle (B-D ULTRAFINE III SHORT PEN) 31G X 8 MM MISC [id number] No Use to give insulin daily Dx E11.9 Baruch Gouty, FNP Taking Active Self  Lancets [facility] ULTRASOFT) lancets [id number] No Use as instructed to monitor glucose 4 times daily (use as back up method for CGM). Brita Romp, NP Taking Active Self  lisinopril (ZESTRIL) 40 MG tablet [id number] No Take 1 tablet (40 mg total) by mouth daily. Ronnie Doss M, DO Taking Active   metFORMIN (GLUCOPHAGE-XR) 500 MG 24 hr tablet [id number] No TAKE 2 TABLETS BY MOUTH TWICE A DAY WITH FOOD Ronnie Doss M, DO Taking Active   mirtazapine (REMERON) 45 MG tablet [id number] No Take 1 tablet (45 mg total) by mouth at bedtime. Ronnie Doss M, DO Taking Active   omeprazole (PRILOSEC) 20 MG capsule [id number] No Take 20 mg by mouth daily. [provider] Taking Active Self  rosuvastatin (CRESTOR) 20 MG tablet [id number] No TAKE 1 TABLET BY MOUTH EVERYDAY AT BEDTIME Gottschalk,  Ashly M, DO Taking Active   Semaglutide, 1 MG/DOSE, (OZEMPIC, 1 MG/DOSE,) 2 MG/1.5ML SOPN [id number]  Inject 1 mg into the skin once a week. Obtains this from PAP Lottie Dawson, [facility] assisting with this) [provider]  Active   sildenafil (VIAGRA) 50 MG tablet [id number] No Take 1 tablet (50 mg total) by mouth daily as needed for erectile dysfunction. [name], DO Taking Active Self  Med List Note Loetta Rough 03/31/13 1303): Lisinopril (strength)               Goals Addressed               This Visit's Progress     Patient Stated     T2DM, HTN, CHF-PHARMD GOAL (pt-stated)        Current Barriers:  Unable to independently afford treatment regimen Unable to maintain control of T2DM, HTN, CHF  Pharmacist Clinical Goal(s):  Over the next 90 days, patient will verbalize ability to afford treatment regimen maintain control of T2DM as evidenced by  CONTINUED GLYCEMIC CONTROL  through collaboration with PharmD and provider.    Interventions: 1:1 collaboration with [name], DO regarding development and update of comprehensive plan of care as evidenced by provider attestation and co-signature Inter-disciplinary care team collaboration (see longitudinal plan of care) Comprehensive medication review performed; medication list updated in electronic medical record  Diabetes: Uncontrolled (improved); current treatment: TRESIBA 50-60 units daily, metformin; Ozempic 1mg  weekly A1c 7.7--9.1% Continue Ozempic TO 1MG  weekly (likely increase to 2mg ) Denies personal and family history of Medullary thyroid cancer (MTC) Glucose improved on 1mg  dose; will recheck A1c at next visit Continue to [place] Experiencing dizziness, passing out, orthostatic hypotension Has CHF diagnosed last year Continue Tresiba 50 units Continue metformin Patient no longer on Novolog meal time insulin with addition of Ozempic Follows with endocrine Current glucose  readings: fasting glucose: <130, post prandial glucose: 150-180 Uses libre 14 day CGM/[facility] via parachute portal (will transition to [place] 3 in the near future as 14 day libre may phase out) Denies hypoglycemic/hyperglycemic symptoms Discussed meal planning options and Plate method for healthy eating Avoid sugary drinks and desserts Incorporate balanced protein, non starchy veggies, 1 serving of carbohydrate with each meal Increase water intake Increase physical activity as able Current exercise: n/a Assessed patient finances. Dose change submitted to novo nordisk patient assistance program for 2024; ships to PCP office; Education provided on diet and lifestyle modifications  Hypertension -patient using [employer] BP monitor at home, but states it's hard for him to get on his arm -BP stable/at goal <130/80 -continue current regimen  Patient Goals/Self-Care Activities Over the next 90 days, patient will:  - take medications as prescribed check glucose 3 times daily or if symptomatic, document, and provide at future appointments  Follow Up Plan: Telephone follow up appointment with care management team member scheduled for: 3 months         Plan: Telephone follow up appointment with care management team member scheduled for:  1 month      Regina Eck, PharmD, [place] Clinical Pharmacist, [place]

## 2022-11-07 ENCOUNTER — Emergency Department (HOSPITAL_COMMUNITY): Payer: Medicare Other

## 2022-11-07 ENCOUNTER — Emergency Department (HOSPITAL_COMMUNITY)
Admission: EM | Admit: 2022-11-07 | Discharge: 2022-11-07 | Disposition: A | Discharge status: Home / Self Care | Payer: Medicare Other | Attending: Emergency Medicine | Admitting: Emergency Medicine

## 2022-11-07 ENCOUNTER — Encounter (HOSPITAL_COMMUNITY): Payer: Self-pay

## 2022-11-07 ENCOUNTER — Other Ambulatory Visit: Payer: Self-pay

## 2022-11-07 DIAGNOSIS — E119 Type 2 diabetes mellitus without complications: Secondary | ICD-10-CM | POA: Diagnosis not present

## 2022-11-07 DIAGNOSIS — I10 Essential (primary) hypertension: Secondary | ICD-10-CM | POA: Insufficient documentation

## 2022-11-07 DIAGNOSIS — Z794 Long term (current) use of insulin: Secondary | ICD-10-CM | POA: Insufficient documentation

## 2022-11-07 DIAGNOSIS — R7989 Other specified abnormal findings of blood chemistry: Secondary | ICD-10-CM | POA: Insufficient documentation

## 2022-11-07 DIAGNOSIS — R531 Weakness: Secondary | ICD-10-CM | POA: Insufficient documentation

## 2022-11-07 DIAGNOSIS — R600 Localized edema: Secondary | ICD-10-CM | POA: Insufficient documentation

## 2022-11-07 DIAGNOSIS — R8289 Other abnormal findings on cytological and histological examination of urine: Secondary | ICD-10-CM | POA: Diagnosis not present

## 2022-11-07 DIAGNOSIS — Z79899 Other long term (current) drug therapy: Secondary | ICD-10-CM | POA: Insufficient documentation

## 2022-11-07 DIAGNOSIS — Z7984 Long term (current) use of oral hypoglycemic drugs: Secondary | ICD-10-CM | POA: Diagnosis not present

## 2022-11-07 DIAGNOSIS — Z87891 Personal history of nicotine dependence: Secondary | ICD-10-CM | POA: Diagnosis not present

## 2022-11-07 DIAGNOSIS — Z7901 Long term (current) use of anticoagulants: Secondary | ICD-10-CM | POA: Diagnosis not present

## 2022-11-07 DIAGNOSIS — R0602 Shortness of breath: Secondary | ICD-10-CM | POA: Insufficient documentation

## 2022-11-07 DIAGNOSIS — R42 Dizziness and giddiness: Secondary | ICD-10-CM | POA: Diagnosis not present

## 2022-11-07 LAB — CBC
HCT: 46.5 % (ref 39.0–52.0)
Hemoglobin: 14.3 g/dL (ref 13.0–17.0)
MCH: 26.2 pg (ref 26.0–34.0)
MCHC: 30.8 g/dL (ref 30.0–36.0)
MCV: 85.2 fL (ref 80.0–100.0)
Platelets: 156 10*3/uL (ref 150–400)
RBC: 5.46 MIL/uL (ref 4.22–5.81)
RDW: 16.4 % — ABNORMAL HIGH (ref 11.5–15.5)
WBC: 5.9 10*3/uL (ref 4.0–10.5)
nRBC: 0 % (ref 0.0–0.2)

## 2022-11-07 LAB — BASIC METABOLIC PANEL
Anion gap: 8 (ref 5–15)
BUN: 14 mg/dL (ref 8–23)
CO2: 22 mmol/L (ref 22–32)
Calcium: 9.1 mg/dL (ref 8.9–10.3)
Chloride: 106 mmol/L (ref 98–111)
Creatinine, Ser: 0.79 mg/dL (ref 0.61–1.24)
GFR, Estimated: >60 mL/min (ref >60)
Glucose, Bld: 138 mg/dL — ABNORMAL HIGH (ref 70–99)
Potassium: 4.1 mmol/L (ref 3.5–5.1)
Sodium: 136 mmol/L (ref 135–145)

## 2022-11-07 LAB — URINALYSIS, ROUTINE W REFLEX MICROSCOPIC
Bacteria, UA: NONE SEEN
Bilirubin Urine: NEGATIVE
Glucose, UA: NEGATIVE mg/dL
Ketones, ur: NEGATIVE mg/dL
Nitrite: NEGATIVE
Protein, ur: 100 mg/dL — AB
Specific Gravity, Urine: 1.014 (ref 1.005–1.030)
pH: 6 (ref 5.0–8.0)

## 2022-11-07 LAB — BRAIN NATRIURETIC PEPTIDE: B Natriuretic Peptide: 331 pg/mL — ABNORMAL HIGH (ref 0.0–100.0)

## 2022-11-07 LAB — CBG MONITORING, ED: Glucose-Capillary: 138 mg/dL — ABNORMAL HIGH (ref 70–99)

## 2022-11-07 NOTE — Discharge Instructions (Signed)
Workup here today without any acute findings.  Recommend following back up with primary care doctor.  Return for any new or worse symptoms.

## 2022-11-07 NOTE — ED Triage Notes (Signed)
Pt c/o intermittent weakness, lightheadedness, and bilateral foot swelling x6 months.  Denies pain.  Pt reports new diagnosis of A Fib x6 months ago.

## 2022-11-07 NOTE — ED Provider Notes (Signed)
[facility] Provider Note   CSN: [id number] Arrival date & time: 11/07/22  [id number]     History  Chief Complaint  Patient presents with   Weakness   Foot Swelling    [name] is a 72 y.o. male.  Patient presenting with the complaint of generalized weakness some bilateral foot swelling for 6 months and lightheadedness no vertigo.  Most of the symptoms been going on for months nothing significant new.  Patient has a known history of atrial fibrillation.  He is on Eliquis for that.  Denies any chest pain does have a feeling of some shortness of breath intermittently.  But nothing new or worse lately.  Past medical history significant for hypertension diabetes atrial fibrillation high cholesterol.  Patient former smoker quit in 2013.       Home Medications Prior to Admission medications   Medication Sig Start Date End Date Taking? Authorizing Provider  ALPRAZolam (XANAX) 1 MG tablet Take 0.5-1 tablets (0.5-1 mg total) by mouth 3 (three) times daily as needed for anxiety (take ONLY IF NEEDED). 05/15/22   [name], DO  amLODipine (NORVASC) 5 MG tablet Take 1 tablet (5 mg total) by mouth daily. 02/12/22   [name], DO  apixaban (ELIQUIS) 5 MG TABS tablet Take 1 tablet (5 mg total) by mouth 2 (two) times daily. 11/23/21 11/18/22  Buford Dresser, MD  Continuous Blood Gluc Sensor (FREESTYLE LIBRE 14 DAY SENSOR) MISC Scan to check glucose at least 4 times daily. DX: E11.4 06/21/21   Ronnie Doss M, DO  famotidine (PEPCID) 20 MG tablet Take 1 tablet (20 mg total) by mouth 2 (two) times daily. 02/12/22   [name], DO  furosemide (LASIX) 20 MG tablet Take 1 tablet (20 mg total) by mouth daily. 11/23/21 11/23/22  Buford Dresser, MD  glucose blood (ONETOUCH VERIO) test strip Use as instructed to monitor glucose 4 times daily (use as back up method for CGM) 04/10/21   Reardon, Juanetta Beets, NP  insulin degludec  (TRESIBA FLEXTOUCH) 200 UNIT/ML FlexTouch Pen Inject 50 Units into the skin in the morning. 03/18/22   Brita Romp, NP  Insulin Pen Needle (B-D ULTRAFINE III SHORT PEN) 31G X 8 MM MISC Use to give insulin daily Dx E11.9 06/09/19   Baruch Gouty, FNP  Lancets [facility] ULTRASOFT) lancets Use as instructed to monitor glucose 4 times daily (use as back up method for CGM). 04/10/21   Brita Romp, NP  lisinopril (ZESTRIL) 40 MG tablet Take 1 tablet (40 mg total) by mouth daily. 02/12/22   Ronnie Doss M, DO  metFORMIN (GLUCOPHAGE-XR) 500 MG 24 hr tablet TAKE 2 TABLETS BY MOUTH TWICE A DAY WITH FOOD 02/12/22   Ronnie Doss M, DO  mirtazapine (REMERON) 45 MG tablet Take 1 tablet (45 mg total) by mouth at bedtime. 02/12/22   [name], DO  omeprazole (PRILOSEC) 20 MG capsule Take 20 mg by mouth daily.    [provider]  rosuvastatin (CRESTOR) 20 MG tablet TAKE 1 TABLET BY MOUTH EVERYDAY AT BEDTIME 02/12/22   Gottschalk, Leatrice Jewels M, DO  Semaglutide, 1 MG/DOSE, (OZEMPIC, 1 MG/DOSE,) 2 MG/1.5ML SOPN Inject 1 mg into the skin once a week. Obtains this from PAP Lottie Dawson, [facility] assisting with this)    [provider]  sildenafil (VIAGRA) 50 MG tablet Take 1 tablet (50 mg total) by mouth daily as needed for erectile dysfunction. 08/16/20   Ronnie Doss  M, DO      Allergies    Farxiga [dapagliflozin]    Review of Systems   Review of Systems  Constitutional:  Positive for fatigue. Negative for chills and fever.  HENT:  Negative for ear pain and sore throat.   Eyes:  Negative for pain and visual disturbance.  Respiratory:  Positive for shortness of breath. Negative for cough.   Cardiovascular:  Negative for chest pain and palpitations.  Gastrointestinal:  Negative for abdominal pain and vomiting.  Genitourinary:  Negative for dysuria and hematuria.  Musculoskeletal:  Negative for arthralgias and back pain.  Skin:  Negative for color change and rash.   Neurological:  Positive for weakness and light-headedness. Negative for seizures and syncope.  All other systems reviewed and are negative.   Physical Exam Updated Vital Signs BP (!) 156/89   Pulse 69   Temp (!) 97.3 F (36.3 C) (Oral)   Resp 10   Ht 1.829 m (6')   Wt 127 kg   SpO2 98%   BMI 37.97 kg/m  Physical Exam Vitals and nursing note reviewed.  Constitutional:      General: He is not in acute distress.    Appearance: Normal appearance. He is well-developed. He is not ill-appearing.  HENT:     Head: Normocephalic and atraumatic.  Eyes:     Extraocular Movements: Extraocular movements intact.     Conjunctiva/sclera: Conjunctivae normal.     Pupils: Pupils are equal, round, and reactive to light.  Cardiovascular:     Rate and Rhythm: Normal rate. Rhythm irregular.     Heart sounds: No murmur heard. Pulmonary:     Effort: Pulmonary effort is normal. No respiratory distress.     Breath sounds: Normal breath sounds. No wheezing or rales.  Abdominal:     Palpations: Abdomen is soft.     Tenderness: There is no abdominal tenderness.  Musculoskeletal:        General: No swelling.     Cervical back: Neck supple.     Right lower leg: Edema present.     Left lower leg: Edema present.     Comments: Mild swelling just to the feet.  Skin:    General: Skin is warm and dry.     Capillary Refill: Capillary refill takes less than 2 seconds.  Neurological:     General: No focal deficit present.     Mental Status: He is alert and oriented to person, place, and time.  Psychiatric:        Mood and Affect: Mood normal.     ED Results / Procedures / Treatments   Labs (all labs ordered are listed, but only abnormal results are displayed) Labs Reviewed  BASIC METABOLIC PANEL - Abnormal; Notable for the following components:      Result Value   Glucose, Bld 138 (*)    All other components within normal limits  CBC - Abnormal; Notable for the following components:   RDW 16.4  (*)    All other components within normal limits  URINALYSIS, ROUTINE W REFLEX MICROSCOPIC - Abnormal; Notable for the following components:   Hgb urine dipstick SMALL (*)    Protein, ur 100 (*)    Leukocytes,Ua MODERATE (*)    All other components within normal limits  BRAIN NATRIURETIC PEPTIDE - Abnormal; Notable for the following components:   B Natriuretic Peptide 331.0 (*)    All other components within normal limits  CBG MONITORING, ED - Abnormal; Notable for the following  components:   Glucose-Capillary 138 (*)    All other components within normal limits    EKG EKG Interpretation  Date/Time:  Thursday November 07 2022 10:01:30 EDT Ventricular Rate:  70 PR Interval:    QRS Duration: 108 QT Interval:  496 QTC Calculation: 536 R Axis:   10 Text Interpretation: Atrial fibrillation Nonspecific T abnormalities, diffuse leads Prolonged QT interval No significant change since last tracing Confirmed by Fredia Sorrow [phone]) on 11/07/2022 10:31:32 AM  Radiology DG Chest 2 View  Result Date: 11/07/2022 CLINICAL DATA:  Exertional shortness of breath. EXAM: CHEST - 2 VIEW COMPARISON:  Chest radiographs 08/16/2022 and 10/29/2021; CT chest 10/29/2021 FINDINGS: Cardiac silhouette is again moderately enlarged. Mild calcifications within the aortic arch. The lungs are clear. No pulmonary edema, pleural effusion, or pneumothorax. 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 IMPRESSION: Stable moderate cardiomegaly.  No acute lung process. Electronically Signed   By: Yvonne Kendall M.D.   On: 11/07/2022 12:35    Procedures Procedures    Medications Ordered in ED Medications - No data to display  ED Course/ Medical Decision Making/ A&P                             Medical Decision Making Amount and/or Complexity of Data Reviewed Labs: ordered. Radiology: ordered.   EKG shows atrial fibrillation rate controlled.   Patient nontoxic no acute distress.  BNP up a little bit 331 normally is in the 200 range.  Patient metabolic panel renal function normal electrolytes normal.  White blood cell count is normal.  Hemoglobin is 14.3.  Platelets 156 urinalysis moderate leukocytes.  11-20 but no bacteria.   Chest x-ray stable moderate cardiomegaly and no acute lung process.  No evidence of any significant pulmonary edema.  Patient symptoms are mostly somewhat chronic in nature primary care doctor apparently aware of this.  Will have patient follow back up with them.  He will return for any new or worse symptoms.   Final Clinical Impression(s) / ED Diagnoses Final diagnoses:  Generalized weakness    Rx / DC Orders ED Discharge Orders     None         Fredia Sorrow, MD 11/07/22 [phone]

## 2022-11-12 ENCOUNTER — Encounter: Payer: Self-pay | Admitting: Family Medicine

## 2022-11-12 ENCOUNTER — Ambulatory Visit (INDEPENDENT_AMBULATORY_CARE_PROVIDER_SITE_OTHER): Payer: Medicare Other | Admitting: Family Medicine

## 2022-11-12 VITALS — BP 139/79 | HR 86 | Temp 98.6°F | Ht 72.0 in | Wt 280.0 lb

## 2022-11-12 DIAGNOSIS — F411 Generalized anxiety disorder: Secondary | ICD-10-CM | POA: Diagnosis not present

## 2022-11-12 DIAGNOSIS — Z79899 Other long term (current) drug therapy: Secondary | ICD-10-CM

## 2022-11-12 DIAGNOSIS — F13930 Sedative, hypnotic or anxiolytic use, unspecified with withdrawal, uncomplicated: Secondary | ICD-10-CM | POA: Diagnosis not present

## 2022-11-12 MED ORDER — BUSPIRONE HCL 7.5 MG PO TABS: 7.5000 mg | ORAL_TABLET | Freq: Three times a day (TID) | ORAL | 5 refills | Status: DC

## 2022-11-12 MED ORDER — ALPRAZOLAM 1 MG PO TABS: 0.5000 mg | ORAL_TABLET | Freq: Three times a day (TID) | ORAL | 1 refills | Status: DC | PRN

## 2022-11-12 NOTE — Patient Instructions (Addendum)
We discussed the dangers of your medication today.  Xanax can cause hallucinations, falls, breathing problems, dementia and even death.  While I do NOT recommend you abruptly discontinue the medication (as this can lead to life threatening withdrawal), I do recommend we reduce your dose and ultimately get you off of this medication.  I am ADDING Buspirone for Anxiety. Start this today Use the xanax SPARINGLY!  We talked about how dangerous this medication is.  I really don't want you using more than one per day.  Hopefully, the Buspirone will help you not need the Xanax as much.

## 2022-11-12 NOTE — Progress Notes (Signed)
Subjective: CC: ER follow-up PCP: Raliegh Ip, DO [name] is a 72 y.o. male presenting to clinic today for:  1.  Generalized weakness Patient reports that he started feeling generalized weakness after he ran out of his Xanax.  He was seen in the ER on 11/07/2022.  Essentially had a negative workup except for mildly elevated BNP.  He reports to me today that he been having an increased need for Xanax because he was feeling anxious but was never using more than 3 times per day.  Typically his 90-day tabs lasts about 3 months.  In prior discussions he reported social anxiety prompting that the panic disorder, specifically things like church or going grocery shopping and he was only utilizing the medications during that time.  He is also treated with mirtazapine 45 mg daily.  He ran out of his benzodiazepine roughly 2 weeks ago after having filled it on August 27, 2022.  He reports feeling like he had heart palpitations and leg weakness after running out of medicine.  He continues to have the symptoms now.   ROS: Per HPI  Allergies  Allergen Reactions   Farxiga [Dapagliflozin] Other (See Comments)    Dizziness, passed out/orthostasis   Past Medical History:  Diagnosis Date   Anxiety    Arthritis    Diabetes mellitus without complication    GERD (gastroesophageal reflux disease)    Hypercholesteremia    Hypertension     Current Outpatient Medications:    ALPRAZolam (XANAX) 1 MG tablet, Take 0.5-1 tablets (0.5-1 mg total) by mouth 3 (three) times daily as needed for anxiety (take ONLY IF NEEDED)., Disp: 90 tablet, Rfl: 1   amLODipine (NORVASC) 5 MG tablet, Take 1 tablet (5 mg total) by mouth daily., Disp: 90 tablet, Rfl: 3   apixaban (ELIQUIS) 5 MG TABS tablet, Take 1 tablet (5 mg total) by mouth 2 (two) times daily., Disp: 180 tablet, Rfl: 3   Continuous Blood Gluc Sensor (FREESTYLE LIBRE 14 DAY SENSOR) MISC, Scan to check glucose at least 4 times daily. DX: E11.4, Disp:  2 each, Rfl: 12   famotidine (PEPCID) 20 MG tablet, Take 1 tablet (20 mg total) by mouth 2 (two) times daily., Disp: 180 tablet, Rfl: 3   furosemide (LASIX) 20 MG tablet, Take 1 tablet (20 mg total) by mouth daily., Disp: 90 tablet, Rfl: 3   glucose blood (ONETOUCH VERIO) test strip, Use as instructed to monitor glucose 4 times daily (use as back up method for CGM), Disp: 100 each, Rfl: 12   insulin degludec (TRESIBA FLEXTOUCH) 200 UNIT/ML FlexTouch Pen, Inject 50 Units into the skin in the morning., Disp: 18 mL, Rfl: 5   Insulin Pen Needle (B-D ULTRAFINE III SHORT PEN) 31G X 8 MM MISC, Use to give insulin daily Dx E11.9, Disp: 100 each, Rfl: 3   Lancets (ONETOUCH ULTRASOFT) lancets, Use as instructed to monitor glucose 4 times daily (use as back up method for CGM)., Disp: 100 each, Rfl: 12   lisinopril (ZESTRIL) 40 MG tablet, Take 1 tablet (40 mg total) by mouth daily., Disp: 90 tablet, Rfl: 3   metFORMIN (GLUCOPHAGE-XR) 500 MG 24 hr tablet, TAKE 2 TABLETS BY MOUTH TWICE A DAY WITH FOOD, Disp: 360 tablet, Rfl: 3   mirtazapine (REMERON) 45 MG tablet, Take 1 tablet (45 mg total) by mouth at bedtime., Disp: 90 tablet, Rfl: 3   omeprazole (PRILOSEC) 20 MG capsule, Take 20 mg by mouth daily., Disp: , Rfl:  rosuvastatin (CRESTOR) 20 MG tablet, TAKE 1 TABLET BY MOUTH EVERYDAY AT BEDTIME, Disp: 90 tablet, Rfl: 3   Semaglutide, 1 MG/DOSE, (OZEMPIC, 1 MG/DOSE,) 2 MG/1.5ML SOPN, Inject 1 mg into the skin once a week. Obtains this from PAP Vanice Sarah, [facility] assisting with this), Disp: , Rfl:    sildenafil (VIAGRA) 50 MG tablet, Take 1 tablet (50 mg total) by mouth daily as needed for erectile dysfunction., Disp: 10 tablet, Rfl: 2 Social History   Socioeconomic History   Marital status: Divorced    Spouse name: Not on file   Number of children: Not on file   Years of education: Not on file   Highest education level: Not on file  Occupational History   Not on file  Tobacco Use   Smoking status: Former     Packs/day: 0.50    Years: 41.00    Additional pack years: 0.00    Total pack years: 20.50    Types: Cigarettes    Quit date: 02/29/2012    Years since quitting: 10.7   Smokeless tobacco: Never  Vaping Use   Vaping Use: Never used  Substance and Sexual Activity   Alcohol use: No    Comment: quit 2012   Drug use: No    Comment: quit in 2012   Sexual activity: Yes    Birth control/protection: None  Other Topics Concern   Not on file  Social History Narrative   Not on file   Social Determinants of Health   Financial Resource Strain: Low Risk  (09/04/2022)   Overall Financial Resource Strain (CARDIA)    Difficulty of Paying Living Expenses: Not hard at all  Food Insecurity: No Food Insecurity (09/04/2022)   Hunger Vital Sign    Worried About Running Out of Food in the Last Year: Never true    Ran Out of Food in the Last Year: Never true  Transportation Needs: No Transportation Needs (09/04/2022)   PRAPARE - Administrator, Civil Service (Medical): No    Lack of Transportation (Non-Medical): No  Physical Activity: Inactive (09/04/2022)   Exercise Vital Sign    Days of Exercise per Week: 0 days    Minutes of Exercise per Session: 0 min  Stress: No Stress Concern Present (09/04/2022)   [employer] of Occupational Health - Occupational Stress Questionnaire    Feeling of Stress : Not at all  Social Connections: Moderately Integrated (09/04/2022)   Social Connection and Isolation Panel [NHANES]    Frequency of Communication with Friends and Family: More than three times a week    Frequency of Social Gatherings with Friends and Family: More than three times a week    Attends Religious Services: More than 4 times per year    Active Member of [employer] or Organizations: Yes    Attends Banker Meetings: More than 4 times per year    Marital Status: Divorced  Intimate Partner Violence: Not At Risk (09/04/2022)   Humiliation, Afraid, Rape, and Kick  questionnaire    Fear of Current or Ex-Partner: No    Emotionally Abused: No    Physically Abused: No    Sexually Abused: No   Family History  Problem Relation Age of Onset   Diabetes Mother    Glaucoma Mother     Objective: Office vital signs reviewed. BP 139/79   Pulse 86   Temp 98.6 F (37 C)   Ht 6' (1.829 m)   Wt 280 lb (127 kg)  SpO2 98%   BMI 37.97 kg/m   Physical Examination:  General: Awake, alert, appears anxious HEENT: Exotropia present.  Sclera white Cardio: regular rate and rhythm, S1S2 heard, no murmurs appreciated Pulm: clear to auscultation bilaterally, no wheezes, rhonchi or rales; normal work of breathing on room air MSK: Ambulating independently with use of cane Psych: Appears anxious  Assessment/ Plan: 72 y.o. male   GAD (generalized anxiety disorder) - Plan: ALPRAZolam (XANAX) 1 MG tablet, ToxASSURE Select 13 (MW), Urine, busPIRone (BUSPAR) 7.5 MG tablet  Controlled substance agreement signed - Plan: ToxASSURE Select 13 (MW), Urine  Benzodiazepine withdrawal without complication  Anxiety is not well-controlled.  I certainly think that he is having benzodiazepine withdrawal.  We discussed the high risk of this medication and frankly I think that we need to start weaning him from this as it is precipitating physical manifestations that have prompted ER evaluation recently.  I am going to start him on BuSpar 3 times daily and we will renew his Xanax for now but plans will be to gradually take him off of the medication.  I would like to see him back again for reevaluation in 4-8 weeks.  This should be planned time for the BuSpar to be therapeutic and we can determine need for advanced dose.  UDS and CSC were updated as per office policy.  The national narcotic database reviewed.  No orders of the defined types were placed in this encounter.  No orders of the defined types were placed in this encounter.    Raliegh IpAshly M Yanelie Abraha, DO [facility] [phone]

## 2022-11-13 ENCOUNTER — Telehealth: Payer: Self-pay

## 2022-11-13 NOTE — Telephone Encounter (Signed)
     Patient  visit on 4/4  at [place]   Have you been able to follow up with your primary care physician? Yes   The patient was or was not able to obtain any needed medicine or equipment. Yes   Are there diet recommendations that you are having difficulty following? Na   Patient expresses understanding of discharge instructions and education provided has no other needs at this time.  Yes      Cordero Surette [facility] Guide, [place] [phone] [address], [place], [place] [address] Phone: [phone] Email: [email or web address]    

## 2022-11-17 LAB — TOXASSURE SELECT 13 (MW), URINE

## 2022-12-02 ENCOUNTER — Encounter: Payer: Self-pay | Admitting: Family Medicine

## 2022-12-02 ENCOUNTER — Ambulatory Visit (INDEPENDENT_AMBULATORY_CARE_PROVIDER_SITE_OTHER): Payer: Medicare Other | Admitting: Family Medicine

## 2022-12-02 VITALS — BP 123/70 | HR 84 | Temp 98.6°F | Ht 72.0 in | Wt 283.4 lb

## 2022-12-02 DIAGNOSIS — F411 Generalized anxiety disorder: Secondary | ICD-10-CM

## 2022-12-02 DIAGNOSIS — F1320 Sedative, hypnotic or anxiolytic dependence, uncomplicated: Secondary | ICD-10-CM | POA: Diagnosis not present

## 2022-12-02 DIAGNOSIS — Z79899 Other long term (current) drug therapy: Secondary | ICD-10-CM

## 2022-12-02 MED ORDER — BUSPIRONE HCL 10 MG PO TABS: 10.0000 mg | ORAL_TABLET | Freq: Three times a day (TID) | ORAL | 2 refills | Status: DC

## 2022-12-02 NOTE — Progress Notes (Unsigned)
Subjective: CC:*** PCP: Raliegh Ip, DO [name] is a 72 y.o. male presenting to clinic today for:  1. ***   ROS: Per HPI  Allergies  Allergen Reactions   Farxiga [Dapagliflozin] Other (See Comments)    Dizziness, passed out/orthostasis   Past Medical History:  Diagnosis Date   Anxiety    Arthritis    Diabetes mellitus without complication (HCC)    GERD (gastroesophageal reflux disease)    Hypercholesteremia    Hypertension     Current Outpatient Medications:    ALPRAZolam (XANAX) 1 MG tablet, Take 0.5-1 tablets (0.5-1 mg total) by mouth 3 (three) times daily as needed for anxiety (take ONLY IF NEEDED)., Disp: 90 tablet, Rfl: 1   amLODipine (NORVASC) 5 MG tablet, Take 1 tablet (5 mg total) by mouth daily., Disp: 90 tablet, Rfl: 3   busPIRone (BUSPAR) 7.5 MG tablet, Take 1 tablet (7.5 mg total) by mouth 3 (three) times daily. For anxiety, Disp: 90 tablet, Rfl: 5   Continuous Blood Gluc Sensor (FREESTYLE LIBRE 14 DAY SENSOR) MISC, Scan to check glucose at least 4 times daily. DX: E11.4, Disp: 2 each, Rfl: 12   famotidine (PEPCID) 20 MG tablet, Take 1 tablet (20 mg total) by mouth 2 (two) times daily., Disp: 180 tablet, Rfl: 3   glucose blood (ONETOUCH VERIO) test strip, Use as instructed to monitor glucose 4 times daily (use as back up method for CGM), Disp: 100 each, Rfl: 12   insulin degludec (TRESIBA FLEXTOUCH) 200 UNIT/ML FlexTouch Pen, Inject 50 Units into the skin in the morning., Disp: 18 mL, Rfl: 5   Insulin Pen Needle (B-D ULTRAFINE III SHORT PEN) 31G X 8 MM MISC, Use to give insulin daily Dx E11.9, Disp: 100 each, Rfl: 3   Lancets (ONETOUCH ULTRASOFT) lancets, Use as instructed to monitor glucose 4 times daily (use as back up method for CGM)., Disp: 100 each, Rfl: 12   lisinopril (ZESTRIL) 40 MG tablet, Take 1 tablet (40 mg total) by mouth daily., Disp: 90 tablet, Rfl: 3   metFORMIN (GLUCOPHAGE-XR) 500 MG 24 hr tablet, TAKE 2 TABLETS BY MOUTH TWICE A  DAY WITH FOOD, Disp: 360 tablet, Rfl: 3   mirtazapine (REMERON) 45 MG tablet, Take 1 tablet (45 mg total) by mouth at bedtime., Disp: 90 tablet, Rfl: 3   omeprazole (PRILOSEC) 20 MG capsule, Take 20 mg by mouth daily., Disp: , Rfl:    rosuvastatin (CRESTOR) 20 MG tablet, TAKE 1 TABLET BY MOUTH EVERYDAY AT BEDTIME, Disp: 90 tablet, Rfl: 3   Semaglutide, 1 MG/DOSE, (OZEMPIC, 1 MG/DOSE,) 2 MG/1.5ML SOPN, Inject 1 mg into the skin once a week. Obtains this from PAP Vanice Sarah, [facility] assisting with this), Disp: , Rfl:    sildenafil (VIAGRA) 50 MG tablet, Take 1 tablet (50 mg total) by mouth daily as needed for erectile dysfunction., Disp: 10 tablet, Rfl: 2   apixaban (ELIQUIS) 5 MG TABS tablet, Take 1 tablet (5 mg total) by mouth 2 (two) times daily., Disp: 180 tablet, Rfl: 3   furosemide (LASIX) 20 MG tablet, Take 1 tablet (20 mg total) by mouth daily., Disp: 90 tablet, Rfl: 3 Social History   Socioeconomic History   Marital status: Divorced    Spouse name: Not on file   Number of children: Not on file   Years of education: Not on file   Highest education level: Not on file  Occupational History   Not on file  Tobacco Use   Smoking status:  Former    Packs/day: 0.50    Years: 41.00    Additional pack years: 0.00    Total pack years: 20.50    Types: Cigarettes    Quit date: 02/29/2012    Years since quitting: 10.7   Smokeless tobacco: Never  Vaping Use   Vaping Use: Never used  Substance and Sexual Activity   Alcohol use: No    Comment: quit 2012   Drug use: No    Comment: quit in 2012   Sexual activity: Yes    Birth control/protection: None  Other Topics Concern   Not on file  Social History Narrative   Not on file   Social Determinants of Health   Financial Resource Strain: Low Risk  (09/04/2022)   Overall Financial Resource Strain (CARDIA)    Difficulty of Paying Living Expenses: Not hard at all  Food Insecurity: No Food Insecurity (09/04/2022)   Hunger Vital Sign     Worried About Running Out of Food in the Last Year: Never true    Ran Out of Food in the Last Year: Never true  Transportation Needs: No Transportation Needs (09/04/2022)   PRAPARE - Administrator, Civil Service (Medical): No    Lack of Transportation (Non-Medical): No  Physical Activity: Inactive (09/04/2022)   Exercise Vital Sign    Days of Exercise per Week: 0 days    Minutes of Exercise per Session: 0 min  Stress: No Stress Concern Present (09/04/2022)   [employer] of Occupational Health - Occupational Stress Questionnaire    Feeling of Stress : Not at all  Social Connections: Moderately Integrated (09/04/2022)   Social Connection and Isolation Panel [NHANES]    Frequency of Communication with Friends and Family: More than three times a week    Frequency of Social Gatherings with Friends and Family: More than three times a week    Attends Religious Services: More than 4 times per year    Active Member of [employer] or Organizations: Yes    Attends Banker Meetings: More than 4 times per year    Marital Status: Divorced  Intimate Partner Violence: Not At Risk (09/04/2022)   Humiliation, Afraid, Rape, and Kick questionnaire    Fear of Current or Ex-Partner: No    Emotionally Abused: No    Physically Abused: No    Sexually Abused: No   Family History  Problem Relation Age of Onset   Diabetes Mother    Glaucoma Mother     Objective: Office vital signs reviewed. BP 123/70   Pulse 84   Temp 98.6 F (37 C)   Ht 6' (1.829 m)   Wt 283 lb 6.4 oz (128.5 kg)   SpO2 97%   BMI 38.44 kg/m   Physical Examination:  General: Awake, alert, *** nourished, No acute distress HEENT: Normal    Neck: No masses palpated. No lymphadenopathy    Ears: Tympanic membranes intact, normal light reflex, no erythema, no bulging    Eyes: PERRLA, extraocular membranes intact, sclera ***    Nose: nasal turbinates moist, *** nasal discharge    Throat: moist mucus membranes, no  erythema, *** tonsillar exudate.  Airway is patent Cardio: regular rate and rhythm, S1S2 heard, no murmurs appreciated Pulm: clear to auscultation bilaterally, no wheezes, rhonchi or rales; normal work of breathing on room air GI: soft, non-tender, non-distended, bowel sounds present x4, no hepatomegaly, no splenomegaly, no masses GU: external vaginal tissue ***, cervix ***, *** punctate lesions on cervix  appreciated, *** discharge from cervical os, *** bleeding, *** cervical motion tenderness, *** abdominal/ adnexal masses Extremities: warm, well perfused, No edema, cyanosis or clubbing; +*** pulses bilaterally MSK: *** gait and *** station Skin: dry; intact; no rashes or lesions Neuro: *** Strength and light touch sensation grossly intact, *** DTRs ***/4  Assessment/ Plan: 72 y.o. male   ***  No orders of the defined types were placed in this encounter.  No orders of the defined types were placed in this encounter.    Raliegh Ip, DO [facility] [phone]

## 2022-12-02 NOTE — Patient Instructions (Signed)
Buspirone increased to 10 mg 3 times per day.  Take this every day 3 times a day to prevent anxiety.  This will replace the bottle I gave you last time. Goal is to have you use the Xanax very little and hopefully, not at all. I want you to try and decrease the dose of your Xanax to just 1/2 tablet once per day if you can.  You have a refill on file with [facility] so you won't run out before our next visit. DO NOT just stop the Xanax or you will go through withdrawals again.

## 2022-12-18 DIAGNOSIS — E1140 Type 2 diabetes mellitus with diabetic neuropathy, unspecified: Secondary | ICD-10-CM | POA: Diagnosis not present

## 2022-12-24 ENCOUNTER — Other Ambulatory Visit: Payer: Self-pay | Admitting: Family Medicine

## 2022-12-24 DIAGNOSIS — F411 Generalized anxiety disorder: Secondary | ICD-10-CM

## 2023-01-01 ENCOUNTER — Telehealth: Payer: Self-pay

## 2023-01-01 ENCOUNTER — Other Ambulatory Visit (HOSPITAL_BASED_OUTPATIENT_CLINIC_OR_DEPARTMENT_OTHER): Payer: Self-pay | Admitting: Cardiology

## 2023-01-01 ENCOUNTER — Telehealth: Payer: Self-pay | Admitting: Family Medicine

## 2023-01-01 ENCOUNTER — Other Ambulatory Visit: Payer: Self-pay | Admitting: Family Medicine

## 2023-01-01 DIAGNOSIS — I5032 Chronic diastolic (congestive) heart failure: Secondary | ICD-10-CM

## 2023-01-01 DIAGNOSIS — I4819 Other persistent atrial fibrillation: Secondary | ICD-10-CM

## 2023-01-01 MED ORDER — APIXABAN 5 MG PO TABS: 5.0000 mg | ORAL_TABLET | Freq: Two times a day (BID) | ORAL | 3 refills | Status: DC

## 2023-01-01 NOTE — Telephone Encounter (Signed)
Please inform of Julie's message

## 2023-01-01 NOTE — Telephone Encounter (Signed)
I've sent rx to his local pharmacy but if he is on a patient assistance program and has not seen Raynelle Fanning for renewal he might need to set up appt with her.

## 2023-01-01 NOTE — Telephone Encounter (Signed)
Patient came to office requesting help with eliquis. States that pharmacist has been helping him get the med for months. Advised that we would send a message and have them contact him. Patient verbalized understanding

## 2023-01-01 NOTE — Telephone Encounter (Signed)
Received shipment of Tresiba and pen needles for patient through patient assistance. Contacted patient and notified him. He states that he will be by to pick them up. Samples placed in fridge and up front

## 2023-01-01 NOTE — Telephone Encounter (Signed)
Left vm for cb

## 2023-01-01 NOTE — Telephone Encounter (Signed)
Rx request sent to pharmacy.  

## 2023-01-02 NOTE — Telephone Encounter (Signed)
Attempted to call patient, voicemail full. Will f/u  No previous enrollment for Eliquis, just enrollment with novo nordisk, and possibly AZ&ME (need to f/u). Will inform patient.

## 2023-01-02 NOTE — Telephone Encounter (Signed)
Received notification from [employer] regarding approval for OZEMPIC 0.25MG /0.5MG  & TRESIBA U200 (+ PEN NEEDLES). Patient assistance approved until 08/05/23.  Medications will ship to office  Phone: [phone]

## 2023-01-06 NOTE — Telephone Encounter (Signed)
Spoke with patient. Says he picked up his Eliquis recently for $4 at pharmacy. Says he didn't ask any questions about the price. Informed him to call the office back if he has copay issues in the future. Pt expressed understanding.

## 2023-01-13 ENCOUNTER — Encounter: Payer: Self-pay | Admitting: Family Medicine

## 2023-01-13 ENCOUNTER — Ambulatory Visit (INDEPENDENT_AMBULATORY_CARE_PROVIDER_SITE_OTHER): Payer: Medicare Other | Admitting: Family Medicine

## 2023-01-13 VITALS — BP 133/80 | HR 70 | Temp 97.9°F | Ht 72.0 in | Wt 280.0 lb

## 2023-01-13 DIAGNOSIS — H40003 Preglaucoma, unspecified, bilateral: Secondary | ICD-10-CM

## 2023-01-13 DIAGNOSIS — F1320 Sedative, hypnotic or anxiolytic dependence, uncomplicated: Secondary | ICD-10-CM

## 2023-01-13 DIAGNOSIS — F411 Generalized anxiety disorder: Secondary | ICD-10-CM

## 2023-01-13 MED ORDER — ALPRAZOLAM 1 MG PO TABS: 0.5000 mg | ORAL_TABLET | Freq: Every day | ORAL | 1 refills | Status: DC | PRN

## 2023-01-13 MED ORDER — BUSPIRONE HCL 15 MG PO TABS: 15.0000 mg | ORAL_TABLET | Freq: Three times a day (TID) | ORAL | 1 refills | Status: DC

## 2023-01-13 NOTE — Patient Instructions (Signed)
Take 1/2 tablet of Xanax in the morning and 15mg  of the Buspirone in the morning too You can take 1.5 tablets of the 10mg  tablets you have at home to equal the 15mg  dose 3 times per day.  Break them into 1.5 tablets until you are ready for a refill.   The new tablets will look different and it will be ONE (1) 15mg  tablet 3 times per day.

## 2023-01-13 NOTE — Progress Notes (Signed)
Subjective: CC: follow up GAD PCP: Raliegh Ip, DO ZOX:[id number] C Calvanese is a 72 y.o. male presenting to clinic today for:  1. GAD with panic attacks Patient reports he is feeling well.  He tried cutting the am dose of xanax down to 1/2 tablet but reports he was feeling breakthrough anxiety so he went back to 1 tablet in the morning and continues 10 mg of buspirone 3 times a day.  He reports good sleep.  He denies any falls.  No heart palpitations.  No sweating.  Overall he feels like he is getting along well.  2.  Glaucoma suspect Patient has an appointment with [facility] on July 9.  His optometrist referred him for some concerning features of glaucoma on his exam.   ROS: Per HPI  Allergies  Allergen Reactions   Farxiga [Dapagliflozin] Other (See Comments)    Dizziness, passed out/orthostasis   Past Medical History:  Diagnosis Date   Anxiety    Arthritis    Diabetes mellitus without complication (HCC)    GERD (gastroesophageal reflux disease)    Hypercholesteremia    Hypertension     Current Outpatient Medications:    ALPRAZolam (XANAX) 1 MG tablet, Take 0.5-1 tablets (0.5-1 mg total) by mouth 3 (three) times daily as needed for anxiety (take ONLY IF NEEDED)., Disp: 90 tablet, Rfl: 1   amLODipine (NORVASC) 5 MG tablet, Take 1 tablet (5 mg total) by mouth daily., Disp: 90 tablet, Rfl: 3   apixaban (ELIQUIS) 5 MG TABS tablet, Take 1 tablet (5 mg total) by mouth 2 (two) times daily., Disp: 180 tablet, Rfl: 3   busPIRone (BUSPAR) 10 MG tablet, TAKE 1 TABLET (10 MG TOTAL) BY MOUTH 3 (THREE) TIMES DAILY. FOR ANXIETY, Disp: 270 tablet, Rfl: 0   Continuous Blood Gluc Sensor (FREESTYLE LIBRE 14 DAY SENSOR) MISC, Scan to check glucose at least 4 times daily. DX: E11.4, Disp: 2 each, Rfl: 12   famotidine (PEPCID) 20 MG tablet, Take 1 tablet (20 mg total) by mouth 2 (two) times daily., Disp: 180 tablet, Rfl: 3   furosemide (LASIX) 20 MG tablet, TAKE 1 TABLET BY MOUTH  EVERY DAY, Disp: 90 tablet, Rfl: 1   glucose blood (ONETOUCH VERIO) test strip, Use as instructed to monitor glucose 4 times daily (use as back up method for CGM), Disp: 100 each, Rfl: 12   insulin degludec (TRESIBA FLEXTOUCH) 200 UNIT/ML FlexTouch Pen, Inject 50 Units into the skin in the morning., Disp: 18 mL, Rfl: 5   Insulin Pen Needle (B-D ULTRAFINE III SHORT PEN) 31G X 8 MM MISC, Use to give insulin daily Dx E11.9, Disp: 100 each, Rfl: 3   Lancets (ONETOUCH ULTRASOFT) lancets, Use as instructed to monitor glucose 4 times daily (use as back up method for CGM)., Disp: 100 each, Rfl: 12   lisinopril (ZESTRIL) 40 MG tablet, Take 1 tablet (40 mg total) by mouth daily., Disp: 90 tablet, Rfl: 3   metFORMIN (GLUCOPHAGE-XR) 500 MG 24 hr tablet, TAKE 2 TABLETS BY MOUTH TWICE A DAY WITH FOOD, Disp: 360 tablet, Rfl: 3   mirtazapine (REMERON) 45 MG tablet, Take 1 tablet (45 mg total) by mouth at bedtime., Disp: 90 tablet, Rfl: 3   omeprazole (PRILOSEC) 20 MG capsule, Take 20 mg by mouth daily., Disp: , Rfl:    rosuvastatin (CRESTOR) 20 MG tablet, TAKE 1 TABLET BY MOUTH EVERYDAY AT BEDTIME, Disp: 90 tablet, Rfl: 3   Semaglutide, 1 MG/DOSE, (OZEMPIC, 1 MG/DOSE,) 2 MG/1.5ML SOPN,  Inject 1 mg into the skin once a week. Obtains this from PAP Vanice Sarah, [facility] assisting with this), Disp: , Rfl:    sildenafil (VIAGRA) 50 MG tablet, Take 1 tablet (50 mg total) by mouth daily as needed for erectile dysfunction., Disp: 10 tablet, Rfl: 2 Social History   Socioeconomic History   Marital status: Divorced    Spouse name: Not on file   Number of children: Not on file   Years of education: Not on file   Highest education level: Not on file  Occupational History   Not on file  Tobacco Use   Smoking status: Former    Packs/day: 0.50    Years: 41.00    Additional pack years: 0.00    Total pack years: 20.50    Types: Cigarettes    Quit date: 02/29/2012    Years since quitting: 10.8   Smokeless tobacco: Never   Vaping Use   Vaping Use: Never used  Substance and Sexual Activity   Alcohol use: No    Comment: quit 2012   Drug use: No    Comment: quit in 2012   Sexual activity: Yes    Birth control/protection: None  Other Topics Concern   Not on file  Social History Narrative   Not on file   Social Determinants of Health   Financial Resource Strain: Low Risk  (09/04/2022)   Overall Financial Resource Strain (CARDIA)    Difficulty of Paying Living Expenses: Not hard at all  Food Insecurity: No Food Insecurity (09/04/2022)   Hunger Vital Sign    Worried About Running Out of Food in the Last Year: Never true    Ran Out of Food in the Last Year: Never true  Transportation Needs: No Transportation Needs (09/04/2022)   PRAPARE - Administrator, Civil Service (Medical): No    Lack of Transportation (Non-Medical): No  Physical Activity: Inactive (09/04/2022)   Exercise Vital Sign    Days of Exercise per Week: 0 days    Minutes of Exercise per Session: 0 min  Stress: No Stress Concern Present (09/04/2022)   [employer] of Occupational Health - Occupational Stress Questionnaire    Feeling of Stress : Not at all  Social Connections: Moderately Integrated (09/04/2022)   Social Connection and Isolation Panel [NHANES]    Frequency of Communication with Friends and Family: More than three times a week    Frequency of Social Gatherings with Friends and Family: More than three times a week    Attends Religious Services: More than 4 times per year    Active Member of [employer] or Organizations: Yes    Attends Banker Meetings: More than 4 times per year    Marital Status: Divorced  Intimate Partner Violence: Not At Risk (09/04/2022)   Humiliation, Afraid, Rape, and Kick questionnaire    Fear of Current or Ex-Partner: No    Emotionally Abused: No    Physically Abused: No    Sexually Abused: No   Family History  Problem Relation Age of Onset   Diabetes Mother    Glaucoma  Mother     Objective: Office vital signs reviewed. BP 133/80   Pulse 70   Temp 97.9 F (36.6 C)   Ht 6' (1.829 m)   Wt 280 lb (127 kg)   SpO2 99%   BMI 37.97 kg/m   Physical Examination:  General: Awake, alert, well nourished, No acute distress Cardio: RRR Psych: Mood stable.  Speech normal.  Well-appearing MSK: Gait is steady.  Utilizing cane for ambulation but minimally using it  Assessment/ Plan: 72 y.o. male   GAD (generalized anxiety disorder) - Plan: ALPRAZolam (XANAX) 1 MG tablet, busPIRone (BUSPAR) 15 MG tablet  Benzodiazepine dependence (HCC) - Plan: busPIRone (BUSPAR) 15 MG tablet  Glaucoma suspect of both eyes  Advance buspirone to 15 mg 3 times daily given reports of daytime anxiety.  I encouraged him to take this along with the half tablet of Xanax for now.  Will at least try and get him down to half tablet in the morning and then at next visit I will try and reduce him further.  He is up-to-date on UDS and CSC as of 11/12/2022.  National narcotic database reviewed and there were no red flags.  Will reevaluate in 3 months, sooner if concerns arise  No orders of the defined types were placed in this encounter.  No orders of the defined types were placed in this encounter.    Raliegh Ip, DO [facility] [phone]

## 2023-01-29 ENCOUNTER — Ambulatory Visit: Payer: Medicare Other | Admitting: Family Medicine

## 2023-02-04 ENCOUNTER — Other Ambulatory Visit (INDEPENDENT_AMBULATORY_CARE_PROVIDER_SITE_OTHER): Payer: Medicare Other | Admitting: Pharmacist

## 2023-02-04 DIAGNOSIS — Z794 Long term (current) use of insulin: Secondary | ICD-10-CM

## 2023-02-04 DIAGNOSIS — E1140 Type 2 diabetes mellitus with diabetic neuropathy, unspecified: Secondary | ICD-10-CM

## 2023-02-11 ENCOUNTER — Telehealth: Payer: Self-pay

## 2023-02-11 DIAGNOSIS — H40013 Open angle with borderline findings, low risk, bilateral: Secondary | ICD-10-CM | POA: Diagnosis not present

## 2023-02-11 NOTE — Telephone Encounter (Signed)
Faxed Ozempic dose increase to [facility].   (2mg  dose pens)

## 2023-02-17 NOTE — Telephone Encounter (Signed)
Ozempic 2mg  dose increase rec'd.

## 2023-02-18 ENCOUNTER — Encounter: Payer: Self-pay | Admitting: Nurse Practitioner

## 2023-02-18 ENCOUNTER — Ambulatory Visit: Payer: Medicare Other | Admitting: Nurse Practitioner

## 2023-02-18 VITALS — BP 134/76 | HR 82 | Ht 72.0 in | Wt 286.2 lb

## 2023-02-18 DIAGNOSIS — E782 Mixed hyperlipidemia: Secondary | ICD-10-CM

## 2023-02-18 DIAGNOSIS — Z7985 Long-term (current) use of injectable non-insulin antidiabetic drugs: Secondary | ICD-10-CM | POA: Diagnosis not present

## 2023-02-18 DIAGNOSIS — N182 Chronic kidney disease, stage 2 (mild): Secondary | ICD-10-CM | POA: Diagnosis not present

## 2023-02-18 DIAGNOSIS — Z794 Long term (current) use of insulin: Secondary | ICD-10-CM

## 2023-02-18 DIAGNOSIS — Z7984 Long term (current) use of oral hypoglycemic drugs: Secondary | ICD-10-CM

## 2023-02-18 DIAGNOSIS — I10 Essential (primary) hypertension: Secondary | ICD-10-CM

## 2023-02-18 DIAGNOSIS — E1122 Type 2 diabetes mellitus with diabetic chronic kidney disease: Secondary | ICD-10-CM

## 2023-02-18 LAB — POCT GLYCOSYLATED HEMOGLOBIN (HGB A1C): Hemoglobin A1C: 8.2 % — AB (ref 4.0–5.6)

## 2023-02-18 NOTE — Progress Notes (Signed)
Endocrinology Follow Up Note       02/18/2023, 1:28 PM   Subjective:    Patient ID: [name], male    DOB: [birth year].  [name] is being seen in follow up after being seen in consultation for management of currently uncontrolled symptomatic diabetes requested by  Raliegh Ip, DO.   Past Medical History:  Diagnosis Date   Anxiety    Arthritis    Diabetes mellitus without complication (HCC)    GERD (gastroesophageal reflux disease)    Hypercholesteremia    Hypertension     Past Surgical History:  Procedure Laterality Date   boils     removed form back of skull   CATARACT EXTRACTION W/PHACO Right 04/02/2013   Procedure: CATARACT EXTRACTION PHACO AND INTRAOCULAR LENS PLACEMENT (IOC);  Surgeon: Susa Simmonds, MD;  Location: [facility];  Service: Ophthalmology;  Laterality: Right;  CDE 7.00   CATARACT EXTRACTION W/PHACO Left 10/04/2013   Procedure: CATARACT EXTRACTION PHACO AND INTRAOCULAR LENS PLACEMENT (IOC);  Surgeon: Susa Simmonds, MD;  Location: [facility];  Service: Ophthalmology;  Laterality: Left;  CDE:1.46   COLONOSCOPY N/A 07/03/2015   Procedure: COLONOSCOPY;  Surgeon: Malissa Hippo, MD;  Location: [facility];  Service: Endoscopy;  Laterality: N/A;  830   EYE SURGERY Right    "valve replaced and stent placed" per pt to reroute the fluid in his eye   ROTATOR CUFF REPAIR Right     Social History   Socioeconomic History   Marital status: Divorced    Spouse name: Not on file   Number of children: Not on file   Years of education: Not on file   Highest education level: Not on file  Occupational History   Not on file  Tobacco Use   Smoking status: Former    Current packs/day: 0.00    Average packs/day: 0.5 packs/day for 41.0 years (20.5 ttl pk-yrs)    Types: Cigarettes    Start date: 03/01/1971    Quit date: 02/29/2012    Years since quitting: 10.9   Smokeless tobacco: Never   Vaping Use   Vaping status: Never Used  Substance and Sexual Activity   Alcohol use: No    Comment: quit 2012   Drug use: No    Comment: quit in 2012   Sexual activity: Yes    Birth control/protection: None  Other Topics Concern   Not on file  Social History Narrative   Not on file   Social Determinants of Health   Financial Resource Strain: Low Risk  (09/04/2022)   Overall Financial Resource Strain (CARDIA)    Difficulty of Paying Living Expenses: Not hard at all  Food Insecurity: No Food Insecurity (09/04/2022)   Hunger Vital Sign    Worried About Running Out of Food in the Last Year: Never true    Ran Out of Food in the Last Year: Never true  Transportation Needs: No Transportation Needs (09/04/2022)   PRAPARE - Administrator, Civil Service (Medical): No    Lack of Transportation (Non-Medical): No  Physical Activity: Inactive (09/04/2022)   Exercise Vital Sign    Days of Exercise per Week:  0 days    Minutes of Exercise per Session: 0 min  Stress: No Stress Concern Present (09/04/2022)   [employer] of Occupational Health - Occupational Stress Questionnaire    Feeling of Stress : Not at all  Social Connections: Moderately Integrated (09/04/2022)   Social Connection and Isolation Panel [NHANES]    Frequency of Communication with Friends and Family: More than three times a week    Frequency of Social Gatherings with Friends and Family: More than three times a week    Attends Religious Services: More than 4 times per year    Active Member of [employer] or Organizations: Yes    Attends Engineer, structural: More than 4 times per year    Marital Status: Divorced    Family History  Problem Relation Age of Onset   Diabetes Mother    Glaucoma Mother     Outpatient Encounter Medications as of 02/18/2023  Medication Sig   [START ON 03/05/2023] ALPRAZolam (XANAX) 1 MG tablet Take 0.5-1 tablets (0.5-1 mg total) by mouth daily as needed for anxiety (take ONLY  IF NEEDED).   amLODipine (NORVASC) 5 MG tablet Take 1 tablet (5 mg total) by mouth daily.   apixaban (ELIQUIS) 5 MG TABS tablet Take 1 tablet (5 mg total) by mouth 2 (two) times daily.   busPIRone (BUSPAR) 15 MG tablet Take 1 tablet (15 mg total) by mouth 3 (three) times daily. For anxiety   Continuous Blood Gluc Sensor (FREESTYLE LIBRE 14 DAY SENSOR) MISC Scan to check glucose at least 4 times daily. DX: E11.4   famotidine (PEPCID) 20 MG tablet Take 1 tablet (20 mg total) by mouth 2 (two) times daily.   furosemide (LASIX) 20 MG tablet TAKE 1 TABLET BY MOUTH EVERY DAY   glucose blood (ONETOUCH VERIO) test strip Use as instructed to monitor glucose 4 times daily (use as back up method for CGM)   insulin degludec (TRESIBA FLEXTOUCH) 200 UNIT/ML FlexTouch Pen Inject 50 Units into the skin in the morning.   Insulin Pen Needle (B-D ULTRAFINE III SHORT PEN) 31G X 8 MM MISC Use to give insulin daily Dx E11.9   Lancets (ONETOUCH ULTRASOFT) lancets Use as instructed to monitor glucose 4 times daily (use as back up method for CGM).   lisinopril (ZESTRIL) 40 MG tablet Take 1 tablet (40 mg total) by mouth daily.   metFORMIN (GLUCOPHAGE-XR) 500 MG 24 hr tablet TAKE 2 TABLETS BY MOUTH TWICE A DAY WITH FOOD   mirtazapine (REMERON) 45 MG tablet Take 1 tablet (45 mg total) by mouth at bedtime.   omeprazole (PRILOSEC) 20 MG capsule Take 20 mg by mouth daily.   rosuvastatin (CRESTOR) 20 MG tablet TAKE 1 TABLET BY MOUTH EVERYDAY AT BEDTIME   Semaglutide, 1 MG/DOSE, (OZEMPIC, 1 MG/DOSE,) 2 MG/1.5ML SOPN Inject 1 mg into the skin once a week. Obtains this from PAP Vanice Sarah, [facility] assisting with this)   sildenafil (VIAGRA) 50 MG tablet Take 1 tablet (50 mg total) by mouth daily as needed for erectile dysfunction.   No facility-administered encounter medications on file as of 02/18/2023.    ALLERGIES: Allergies  Allergen Reactions   Farxiga [Dapagliflozin] Other (See Comments)    Dizziness, passed  out/orthostasis    VACCINATION STATUS: Immunization History  Administered Date(s) Administered   Fluad Quad(high Dose 65+) 04/30/2017, 04/13/2019, 05/15/2022   Influenza Split 04/19/2014, 05/29/2020   Influenza, Seasonal, Injecte, Preservative Fre 05/09/2015   Influenza-Unspecified 05/18/2014, 05/09/2015, 04/22/2018, 05/24/2020, 05/19/2021   Moderna SARS-COV2  Booster Vaccination 12/13/2020   Moderna Sars-Covid-2 Vaccination 09/30/2019, 10/29/2019, 06/24/2020, 12/13/2020   Pneumococcal Conjugate-13 04/23/2018   Pneumococcal Polysaccharide-23 12/28/2015   Zoster Recombinant(Shingrix) 02/12/2022   Zoster, Live 01/04/2016    Diabetes He presents for his follow-up diabetic visit. He has type 2 diabetes mellitus. Onset time: diagnosed at approx age of 57. His disease course has been improving. There are no hypoglycemic associated symptoms. Associated symptoms include foot paresthesias. Pertinent negatives for diabetes include no blurred vision, no fatigue, no polydipsia, no polyuria, no visual change, no weakness and no weight loss. There are no hypoglycemic complications. Symptoms are improving. Diabetic complications include heart disease, impotence, nephropathy and retinopathy. Risk factors for coronary artery disease include diabetes mellitus, dyslipidemia, family history, hypertension, male sex, obesity, sedentary lifestyle and tobacco exposure. Current diabetic treatment includes insulin injections and oral agent (monotherapy) (and Ozempic). He is compliant with treatment most of the time. His weight is fluctuating minimally. He is following a generally healthy diet. When asked about meal planning, he reported none. He has not had a previous visit with a dietitian. He rarely participates in exercise. His home blood glucose trend is decreasing steadily. His overall blood glucose range is 140-180 mg/dl. (He presents today with his CGM showing improved, mostly at goal glycemic profile.  His POCT A1c  today was 8.2%, improving from last visit of 9.1%.  Analysis of his CGM shows TIR 80%, TAR 20%, TBR 0%.  He denies any hypoglycemia.  Says lowest reading he remembers seeing was in the 80s.  He notes he has tolerated the higher dose of Ozempic well, has intermittent constipation.) An ACE inhibitor/angiotensin II receptor blocker is being taken. He does not see a podiatrist.Eye exam is current.  Hyperlipidemia This is a chronic problem. The current episode started more than 1 year ago. The problem is controlled. Recent lipid tests were reviewed and are normal. Exacerbating diseases include chronic renal disease, diabetes and obesity. Factors aggravating his hyperlipidemia include fatty foods. Current antihyperlipidemic treatment includes statins. The current treatment provides moderate improvement of lipids. Compliance problems include adherence to diet and adherence to exercise.  Risk factors for coronary artery disease include diabetes mellitus, dyslipidemia, family history, male sex, hypertension, obesity and a sedentary lifestyle.  Hypertension This is a chronic problem. The current episode started more than 1 year ago. The problem has been resolved since onset. The problem is controlled. Pertinent negatives include no blurred vision. There are no associated agents to hypertension. Risk factors for coronary artery disease include diabetes mellitus, dyslipidemia, family history, male gender, obesity, sedentary lifestyle and smoking/tobacco exposure. Past treatments include ACE inhibitors and calcium channel blockers. The current treatment provides moderate improvement. There are no compliance problems.  Hypertensive end-organ damage includes kidney disease, CAD/MI and retinopathy. Identifiable causes of hypertension include chronic renal disease.    Review of systems  Constitutional: + minimally fluctuating body weight,  current Body mass index is 38.82 kg/m., no fatigue, no subjective hyperthermia, no  subjective hypothermia Eyes: + blurry vision (bilateral retinopathy), legally blind in right eye, no xerophthalmia ENT: no sore throat, no nodules palpated in throat, no dysphagia/odynophagia, no hoarseness Cardiovascular: no chest pain, no shortness of breath, no palpitations, no leg swelling Respiratory: no cough, no shortness of breath Gastrointestinal: no nausea/vomiting/diarrhea Musculoskeletal: no muscle/joint aches, walks with cane for deconditioning Skin: no rashes, no hyperemia Neurological: no tremors, no numbness, no tingling, no dizziness Psychiatric: no depression, no anxiety   Objective:     BP 134/76 (BP Location: Right  Arm, Patient Position: Sitting, Cuff Size: Large)   Pulse 82   Ht 6' (1.829 m)   Wt 286 lb 3.2 oz (129.8 kg)   BMI 38.82 kg/m   Wt Readings from Last 3 Encounters:  02/18/23 286 lb 3.2 oz (129.8 kg)  01/13/23 280 lb (127 kg)  12/02/22 283 lb 6.4 oz (128.5 kg)     BP Readings from Last 3 Encounters:  02/18/23 134/76  01/13/23 133/80  12/02/22 123/70     Physical Exam- Limited  Constitutional:  Body mass index is 38.82 kg/m. , not in acute distress, normal state of mind Eyes:  EOMI, no exophthalmos, right eye opaque conjunctiva Neck: Supple Musculoskeletal: no gross deformities, strength intact in all four extremities, no gross restriction of joint movements, walks with cane for deconditioning Skin:  no rashes, no hyperemia Neurological: no tremor with outstretched hands   Diabetic Foot Exam - Simple   No data filed    CMP ( most recent) CMP     Component Value Date/Time   NA 136 11/07/2022 1028   NA 145 (H) 11/23/2021 0933   K 4.1 11/07/2022 1028   CL 106 11/07/2022 1028   CO2 22 11/07/2022 1028   GLUCOSE 138 (H) 11/07/2022 1028   BUN 14 11/07/2022 1028   BUN 21 11/23/2021 0933   CREATININE 0.79 11/07/2022 1028   CALCIUM 9.1 11/07/2022 1028   PROT 8.4 (H) 08/16/2022 1020   PROT 7.4 06/11/2021 0958   ALBUMIN 3.7 08/16/2022  1020   ALBUMIN 4.1 06/11/2021 0958   AST 26 08/16/2022 1020   ALT 20 08/16/2022 1020   ALKPHOS 82 08/16/2022 1020   BILITOT 0.7 08/16/2022 1020   BILITOT 0.7 06/11/2021 0958   GFRNONAA >60 11/07/2022 1028   GFRAA 92 04/13/2020 0906     Diabetic Labs (most recent): Lab Results  Component Value Date   HGBA1C 8.2 (A) 02/18/2023   HGBA1C 9.1 (H) 08/27/2022   HGBA1C 9.2 (H) 05/15/2022   MICROALBUR 150 12/05/2020     Lipid Panel ( most recent) Lipid Panel     Component Value Date/Time   CHOL 103 06/11/2021 0958   TRIG 98 06/11/2021 0958   HDL 27 (L) 06/11/2021 0958   CHOLHDL 3.8 06/11/2021 0958   LDLCALC 57 06/11/2021 0958   LABVLDL 19 06/11/2021 0958      Lab Results  Component Value Date   TSH 0.781 08/16/2022   TSH 0.663 10/29/2021   TSH 0.280 (L) 07/05/2019   TSH 0.124 (L) 04/28/2019   TSH 0.059 (L) 04/06/2019           Assessment & Plan:   1) Uncontrolled Type 2 Diabetes with long-term use of insulin  - [name] has currently uncontrolled symptomatic type 2 DM since 72 years of age.   He presents today with his CGM showing improved, mostly at goal glycemic profile.  His POCT A1c today was 8.2%, improving from last visit of 9.1%.  Analysis of his CGM shows TIR 80%, TAR 20%, TBR 0%.  He denies any hypoglycemia.  Says lowest reading he remembers seeing was in the 80s.  He notes he has tolerated the higher dose of Ozempic well, has intermittent constipation.  -Recent labs reviewed.  - I had a long discussion with him about the progressive nature of diabetes and the pathology behind its complications. -his diabetes is complicated by CKD, CAD and he remains at a high risk for more acute and chronic complications which include CAD, CVA, CKD,  retinopathy, and neuropathy. These are all discussed in detail with him.  - Nutritional counseling repeated at each appointment due to patients tendency to fall back in to old habits.  - The patient admits there is a room  for improvement in their diet and drink choices. -  Suggestion is made for the patient to avoid simple carbohydrates from their diet including Cakes, Sweet Desserts / Pastries, Ice Cream, Soda (diet and regular), Sweet Tea, Candies, Chips, Cookies, Sweet Pastries, Store Bought Juices, Alcohol in Excess of 1-2 drinks a day, Artificial Sweeteners, Coffee Creamer, and "Sugar-free" Products. This will help patient to have stable blood glucose profile and potentially avoid unintended weight gain.   - I encouraged the patient to switch to unprocessed or minimally processed complex starch and increased protein intake (animal or plant source), fruits, and vegetables.   - Patient is advised to stick to a routine mealtimes to eat 3 meals a day and avoid unnecessary snacks (to snack only to correct hypoglycemia).  - I have approached him with the following individualized plan to manage  his diabetes and patient agrees:   -Given his improved, at goal glycemic profile overall, he can lower his [place] to 45 units SQ nightly (may help efforts to lose weight) and continue the higher dose of Ozempic 2 mg SW weekly (from PAP set up by his PCP) and continue Metformin 500 mg ER twice daily with meals.   -he is encouraged to continue using his CGM to monitor glucose 4 times daily, before meals and before bed, and to call the clinic if he has readings less than 70 or greater than 300 for 3 tests in a row.  - he is warned not to take insulin without proper monitoring per orders. - Adjustment parameters are given to him for hypo and hyperglycemia in writing.  - Specific targets for  A1c;  LDL, HDL,  and Triglycerides were discussed with the patient.  2) Blood Pressure /Hypertension:  his blood pressure is controlled to target.   he is advised to continue his current medications including Norvasc 5 mg po daily and Lisinopril 40 mg p.o. daily with breakfast.  3) Lipids/Hyperlipidemia:    Review of his recent lipid  panel from 06/11/21 showed controlled LDL at 57 .  he  is advised to continue Crestor 20 mg daily at bedtime.  Side effects and precautions discussed with him.  He has appt coming up with his PCP in Sept, will defer labs to her.  4)  Weight/Diet:  his Body mass index is 38.82 kg/m.  -  clearly complicating his diabetes care.   he is a candidate for weight loss. I discussed with him the fact that loss of 5 - 10% of his  current body weight will have the most impact on his diabetes management.  Exercise, and detailed carbohydrates information provided  -  detailed on discharge instructions.  5) Chronic Care/Health Maintenance: -he is on ACEI/ARB and Statin medications and is encouraged to initiate and continue to follow up with Ophthalmology, Dentist, Podiatrist at least yearly or according to recommendations, and advised to stay away from smoking. I have recommended yearly flu vaccine and pneumonia vaccine at least every 5 years; moderate intensity exercise for up to 150 minutes weekly; and sleep for at least 7 hours a day.  - he is advised to maintain close follow up with Raliegh Ip, DO for primary care needs, as well as his other providers for optimal and coordinated care.  I spent  30  minutes in the care of the patient today including review of labs from CMP, Lipids, Thyroid Function, Hematology (current and previous including abstractions from other facilities); face-to-face time discussing  his blood glucose readings/logs, discussing hypoglycemia and hyperglycemia episodes and symptoms, medications doses, his options of short and long term treatment based on the latest standards of care / guidelines;  discussion about incorporating lifestyle medicine;  and documenting the encounter. Risk reduction counseling performed per USPSTF guidelines to reduce obesity and cardiovascular risk factors.     Please refer to Patient Instructions for Blood Glucose Monitoring and Insulin/Medications  Dosing Guide"  in media tab for additional information. Please  also refer to " Patient Self Inventory" in the Media  tab for reviewed elements of pertinent patient history.  [name] participated in the discussions, expressed understanding, and voiced agreement with the above plans.  All questions were answered to his satisfaction. he is encouraged to contact clinic should he have any questions or concerns prior to his return visit.   Follow up plan: - Return in about 4 months (around 06/21/2023) for Diabetes F/U with A1c in office, No previsit labs, Bring meter and logs.  Ronny Bacon, [facility] [address] [place], [place] [address] Phone: [phone] Fax: [phone]  02/18/2023, 1:28 PM

## 2023-02-19 NOTE — Progress Notes (Addendum)
02/04/2023 Name: [name] MRN: [id number] DOB: 04-14-1951  Chief Complaint  Patient presents with   Diabetes    [name] is a 72 y.o. year old male who presented for a telephone visit.   They were referred to the pharmacist by their PCP for assistance in managing diabetes, hypertension, hyperlipidemia, and medication access.    Subjective:  Care Team: Primary Care Provider: Raliegh Ip, DO  Medication Access/Adherence  Patient reports affordability concerns with their medications: Yes  Patient reports access/transportation concerns to their pharmacy: No  Patient reports adherence concerns with their medications:  Yes  due to cost   Diabetes:  Current medications: tresiba, ozempic, metformin Medications tried in the past: dapagliflozin, Novolog  Current glucose readings: FBG<135-140; postprandials/snacks elevated at times to 200 Using libre 2 CGM meter; testing continuously   -Patient is testing blood sugar 4-6 times daily -Patient is injecting insulin daily -He greatly benefits from his Libre CGM  Patient denies hypoglycemic s/sx including dizziness, shakiness, sweating. Patient denies hyperglycemic symptoms including polyuria, polydipsia, polyphagia, nocturia, neuropathy, blurred vision.  Current physical activity: encourages as able  Current medication access support: Novo Nordisk PAP   Objective:  Lab Results  Component Value Date   HGBA1C 8.2 (A) 02/18/2023    Lab Results  Component Value Date   CREATININE 0.79 11/07/2022   BUN 14 11/07/2022   NA 136 11/07/2022   K 4.1 11/07/2022   CL 106 11/07/2022   CO2 22 11/07/2022    Lab Results  Component Value Date   CHOL 103 06/11/2021   HDL 27 (L) 06/11/2021   LDLCALC 57 06/11/2021   TRIG 98 06/11/2021   CHOLHDL 3.8 06/11/2021    Medications Reviewed Today     Reviewed by Dani Gobble, NP (Nurse Practitioner) on 02/18/23 at 1324  Med List Status: <None>   Medication Order  Taking? Sig Documenting Provider Last Dose Status Informant  ALPRAZolam (XANAX) 1 MG tablet [id number] Yes Take 0.5-1 tablets (0.5-1 mg total) by mouth daily as needed for anxiety (take ONLY IF NEEDED). Delynn Flavin M, DO Taking Active   amLODipine (NORVASC) 5 MG tablet [id number] Yes Take 1 tablet (5 mg total) by mouth daily. Delynn Flavin M, DO Taking Active   apixaban (ELIQUIS) 5 MG TABS tablet [id number] Yes Take 1 tablet (5 mg total) by mouth 2 (two) times daily. Delynn Flavin M, DO Taking Active   busPIRone (BUSPAR) 15 MG tablet [id number] Yes Take 1 tablet (15 mg total) by mouth 3 (three) times daily. For anxiety Delynn Flavin M, DO Taking Active   Continuous Blood Gluc Sensor (FREESTYLE LIBRE 14 DAY SENSOR) [place] [id number] Yes Scan to check glucose at least 4 times daily. DX: E11.4 Raliegh Ip, DO Taking Active Self  famotidine (PEPCID) 20 MG tablet [id number] Yes Take 1 tablet (20 mg total) by mouth 2 (two) times daily. Delynn Flavin M, DO Taking Active   furosemide (LASIX) 20 MG tablet [id number] Yes TAKE 1 TABLET BY MOUTH EVERY DAY Jodelle Red, MD Taking Active   glucose blood (ONETOUCH VERIO) test strip [id number] Yes Use as instructed to monitor glucose 4 times daily (use as back up method for CGM) Dani Gobble, NP Taking Active Self  insulin degludec (TRESIBA FLEXTOUCH) 200 UNIT/ML FlexTouch Pen [id number] Yes Inject 50 Units into the skin in the morning. Dani Gobble, NP Taking Active            Med Note (Alayne Estrella D   Tue  Sep 10, 2022  9:42 AM) Via novo nordisk patient assistance program    Insulin Pen Needle (B-D ULTRAFINE III SHORT PEN) 31G X 8 MM MISC [id number] Yes Use to give insulin daily Dx E11.9 Sonny Masters, FNP Taking Active Self  Lancets [facility] ULTRASOFT) lancets [id number] Yes Use as instructed to monitor glucose 4 times daily (use as back up method for CGM). Dani Gobble, NP Taking Active Self  lisinopril (ZESTRIL) 40  MG tablet [id number] Yes Take 1 tablet (40 mg total) by mouth daily. Delynn Flavin M, DO Taking Active   metFORMIN (GLUCOPHAGE-XR) 500 MG 24 hr tablet [id number] Yes TAKE 2 TABLETS BY MOUTH TWICE A DAY WITH FOOD Delynn Flavin M, DO Taking Active   mirtazapine (REMERON) 45 MG tablet [id number] Yes Take 1 tablet (45 mg total) by mouth at bedtime. Delynn Flavin M, DO Taking Active   omeprazole (PRILOSEC) 20 MG capsule [id number] Yes Take 20 mg by mouth daily. [provider] Taking Active Self  rosuvastatin (CRESTOR) 20 MG tablet [id number] Yes TAKE 1 TABLET BY MOUTH EVERYDAY AT BEDTIME Delynn Flavin M, DO Taking Active   Semaglutide, 1 MG/DOSE, (OZEMPIC, 1 MG/DOSE,) 2 MG/1.5ML SOPN [id number] Yes Inject 1 mg into the skin once a week. Obtains this from PAP Vanice Sarah, [facility] assisting with this) [provider] Taking Active            Med Note Shona Needles   Tue Feb 18, 2023  1:08 PM) Patient is injecting 0.05 mg weekly.  sildenafil (VIAGRA) 50 MG tablet [id number] Yes Take 1 tablet (50 mg total) by mouth daily as needed for erectile dysfunction. Raliegh Ip, DO Taking Active Self  Med List Note Milana Obey 03/31/13 1303): Lisinopril (strength)             Assessment/Plan:   Diabetes: - Currently uncontrolled - Reviewed long term cardiovascular and renal outcomes of uncontrolled blood sugar - Reviewed goal A1c, goal fasting, and goal 2 hour post prandial glucose - Reviewed dietary modifications including FOLLOWING A HEART HEALTHY DIET/HEALTHY PLATE METHOD - Recommend to : increase Ozempic to 1mg  sq weekly (will submit dose form change to [employer] PAP)   Denies personal and family history of Medullary thyroid cancer (MTC) - Continue all other medications as prescribed - Recommend to check glucose continuously using CGM   Follow Up Plan: 8 weeks  Kieth Brightly, PharmD, BCACP Clinical Pharmacist, [facility]

## 2023-02-22 ENCOUNTER — Other Ambulatory Visit: Payer: Self-pay | Admitting: Family Medicine

## 2023-03-04 ENCOUNTER — Telehealth: Payer: Self-pay

## 2023-03-04 NOTE — Telephone Encounter (Signed)
Patient informed we have received four boxes of Ozempic and it will be placed in the refrigerator for him to pick up.

## 2023-03-11 DIAGNOSIS — E1140 Type 2 diabetes mellitus with diabetic neuropathy, unspecified: Secondary | ICD-10-CM | POA: Diagnosis not present

## 2023-03-18 ENCOUNTER — Ambulatory Visit (INDEPENDENT_AMBULATORY_CARE_PROVIDER_SITE_OTHER): Payer: Medicare Other | Admitting: Pharmacist

## 2023-03-18 ENCOUNTER — Other Ambulatory Visit: Payer: Self-pay | Admitting: Pharmacist

## 2023-03-18 DIAGNOSIS — E1169 Type 2 diabetes mellitus with other specified complication: Secondary | ICD-10-CM

## 2023-03-18 DIAGNOSIS — Z794 Long term (current) use of insulin: Secondary | ICD-10-CM

## 2023-03-18 NOTE — Progress Notes (Signed)
   03/18/2023 Name: [name] MRN: [id number] DOB: 16-Oct-1950  Libre 3 CGM new potential start  Patient is having difficulty scanning consistently.  While his blood sugars have improved, he would greatly benefit from having the Libre 3 CGM (bluetooth continuous sensors) as no scanning is required.  A1c has come down to 8.2% from >9%.  Patient reports compliance with all medications.   Assessment/Plan:  Libre 3 CGM order sent to [facility] via parachute portal [phone] [facility] FOR LIBRE REFILLS)    Follow Up Plan: 2 months post cardiology visit   Kieth Brightly, PharmD, BCACP Clinical Pharmacist, [facility]

## 2023-03-18 NOTE — Patient Instructions (Addendum)
It was great to see you today!  Increase Ozempic to 1 mg weekly. This is the medicine in the teal box. Please let us know if you do not have a supply in your fridge.   Decrease your Tresiba (insulin) to 45 units every night.   Start scanning your sensor before you go to sleep so you have continuous monitoring.   Let us know if you have any questions!

## 2023-03-18 NOTE — Progress Notes (Signed)
03/18/2023 Name: [name] MRN: [id number] DOB: 1951-06-01  Chief Complaint  Patient presents with   Diabetes    [name] is a 72 y.o. year old male who was referred for medication management by their primary care provider, Delynn Flavin M, DO. They presented for a face to face visit today.   They were referred to the pharmacist by their PCP for assistance in managing diabetes.   Subjective: Saw endo 7/16 - recommended increasing to Ozempic (semaglutide) 2 mg- requested from Novo, but patient just received 4 boxes of semaglutide 1 mg.   Having dizziness when he goes from sitting to standing, when he turns around. Has been out of buspirone and alprazolam - but was previously taking both of these medications TID.   He is taking his blood pressures at home. Does not feel that blood pressure or blood sugars correlate with dizziness.   Care Team: Primary Care Provider: Raliegh Ip, DO ; Next Scheduled Visit: 04/15/23  Medication Access/Adherence  Patient reports affordability concerns with their medications: Yes . Eliquis is expensive but he still has some at home (~5 tabs left) Patient reports access/transportation concerns to their pharmacy: No  Patient reports adherence concerns with their medications:  Yes    Diabetes:  Still taking semaglutide 0.5 mg weekly, worried about cranking all the way up to the 1 mg - concerned that he would give himself too high of a dose.   Current medications: insulin degludec Evaristo Bury) 45 u nightly, semaglutide (Ozempic) 1 mg weekly, metformin XR 500 mg BID  Medications tried in the past: dapagliflozin, empagliflozin (syncope, dizziness with both), novolog meal time insulin  Using freestyle Libre 14 day system. (Patient uses [employer] via parachute portal)  Reports occasional BG in 60s.       Patient denies hypoglycemic s/sx including dizziness, shakiness, sweating. Patient denies hyperglycemic symptoms including  polyuria, polydipsia, polyphagia, nocturia, neuropathy, blurred vision.  Current meal patterns: 3 meals a day.  - Breakfast: eggs,  - Supper: can of spaghetti - Drinks: drink 3-4 ~20 oz bottles of water per day   Current physical activity: moves around as able  Current medication access support: Novo Nordisk PAP and Ozempic and Tresiba   Hypertension:  Current medications: amlodipine 5 mg daily, lisinopril 40 mg daily  Patient has a validated, automated, upper arm home BP cuff. His daughter who is a nurse will also help him check his BP.  Current blood pressure readings readings: did not provide specific readings - states they have been "controlled."  Objective:  Lab Results  Component Value Date   HGBA1C 8.2 (A) 02/18/2023    Lab Results  Component Value Date   CREATININE 0.79 11/07/2022   BUN 14 11/07/2022   NA 136 11/07/2022   K 4.1 11/07/2022   CL 106 11/07/2022   CO2 22 11/07/2022    Lab Results  Component Value Date   CHOL 103 06/11/2021   HDL 27 (L) 06/11/2021   LDLCALC 57 06/11/2021   TRIG 98 06/11/2021   CHOLHDL 3.8 06/11/2021    Medications Reviewed Today     Reviewed by Particia Lather, RPH (Pharmacist) on 03/18/23 at 1028  Med List Status: <None>   Medication Order Taking? Sig Documenting Provider Last Dose Status Informant  ALPRAZolam (XANAX) 1 MG tablet [id number] No Take 0.5-1 tablets (0.5-1 mg total) by mouth daily as needed for anxiety (take ONLY IF NEEDED).  Patient not taking: Reported on 03/18/2023   Raliegh Ip,  DO Not Taking Active            Med Note Particia Lather   Tue Mar 18, 2023 10:11 AM) Ran out- but was taking 1 mg TID previously  amLODipine (NORVASC) 5 MG tablet [id number] Yes Take 1 tablet (5 mg total) by mouth daily. Delynn Flavin M, DO Taking Active   apixaban (ELIQUIS) 5 MG TABS tablet [id number] Yes Take 1 tablet (5 mg total) by mouth 2 (two) times daily. Delynn Flavin M, DO Taking Active   busPIRone (BUSPAR) 15 MG  tablet [id number]  Take 1 tablet (15 mg total) by mouth 3 (three) times daily. For anxiety Delynn Flavin M, DO  Active   Continuous Blood Gluc Sensor (FREESTYLE LIBRE 14 DAY SENSOR) [place] [id number]  Scan to check glucose at least 4 times daily. DX: E11.4 Raliegh Ip, DO  Active Self  famotidine (PEPCID) 20 MG tablet [id number]  Take 1 tablet (20 mg total) by mouth 2 (two) times daily. Delynn Flavin M, DO  Active   furosemide (LASIX) 20 MG tablet [id number] Yes TAKE 1 TABLET BY MOUTH EVERY DAY Jodelle Red, MD Taking Active   glucose blood (ONETOUCH VERIO) test strip [id number]  Use as instructed to monitor glucose 4 times daily (use as back up method for CGM) Dani Gobble, NP  Active Self  insulin degludec (TRESIBA FLEXTOUCH) 200 UNIT/ML FlexTouch Pen [id number] Yes Inject 50 Units into the skin in the morning. Dani Gobble, NP Taking Active            Med Note Danella Maiers   Tue Sep 10, 2022  9:42 AM) Via novo nordisk patient assistance program    Insulin Pen Needle (B-D ULTRAFINE III SHORT PEN) 31G X 8 MM MISC [id number]  Use to give insulin daily Dx E11.9 Sonny Masters, FNP  Active Self  Lancets [facility] ULTRASOFT) lancets [id number]  Use as instructed to monitor glucose 4 times daily (use as back up method for CGM). Dani Gobble, NP  Active Self  lisinopril (ZESTRIL) 40 MG tablet [id number] Yes TAKE 1 TABLET BY MOUTH EVERY DAY Delynn Flavin M, DO Taking Active   metFORMIN (GLUCOPHAGE-XR) 500 MG 24 hr tablet [id number] Yes TAKE 2 TABLETS BY MOUTH TWICE A DAY WITH FOOD Delynn Flavin M, DO Taking Active   mirtazapine (REMERON) 45 MG tablet [id number] Yes TAKE 1 TABLET BY MOUTH AT BEDTIME. Delynn Flavin M, DO Taking Active   omeprazole (PRILOSEC) 20 MG capsule [id number] Yes Take 20 mg by mouth daily. [provider] Taking Active Self  rosuvastatin (CRESTOR) 20 MG tablet [id number] Yes TAKE 1 TABLET BY MOUTH EVERYDAY AT BEDTIME Delynn Flavin M, DO Taking Active   Semaglutide, 1 MG/DOSE, (OZEMPIC, 1 MG/DOSE,) 2 MG/1.5ML SOPN [id number] Yes Inject 1 mg into the skin once a week. Obtains this from PAP Vanice Sarah, [facility] assisting with this) [provider] Taking Active            Med Note Shona Needles   Tue Feb 18, 2023  1:08 PM) Patient is injecting 0.05 mg weekly.  sildenafil (VIAGRA) 50 MG tablet [id number]  Take 1 tablet (50 mg total) by mouth daily as needed for erectile dysfunction. Raliegh Ip, DO  Active Self  Med List Note Milana Obey 03/31/13 1303): Lisinopril (strength)             Assessment/Plan:   Diabetes: - Currently uncontrolled per TIR of 61% below goal >70%. Last A1c  on 02/18/23 of 8.2% was improved from 9.1% in Jan 2024. Per CGM, BG is most elevated post-prandially. Would benefit from continued titration of GLP-1 RA. Discussed risk vs benefit at length, and reassured patient that it is safe to dial up pen to 1 mg. Pt does have some gaps in monitoring - would benefit from [facility] 3 system. No instances of hypoglycemia over past two weeks, but in effort to reduce insulin and promote weight loss- reiterated recommendation from endocrinology to reduce basal insulin to 45 units nightly.  - Reviewed long term cardiovascular and renal outcomes of uncontrolled blood sugar - Reviewed goal A1c, goal fasting, and goal 2 hour post prandial glucose - Reviewed lifestyle modifications including: optimizing water intake, staying physically active - Recommend to increase Ozempic (semaglutide) to 1 mg subcutaneous weekly (patient should have 4 mo supply at home from Novo PAP).   - Recommend to decrease Tresiba (insulin degludec) to 45u nightly.  - Continue metformin XR 1000 mg BID - Recommend to repeat UACR at PCP f/u  - Recommend to start scanning Libre sensor before bed to avoid gaps in monitoring. Will attempt to order Freestyle Libre 3 to investigate coverage. (Patient uses [employer] via parachute portal) - Patient appropriately treated with high intensity statin - cont rosuvastatin 20 mg daily  Hypertension: - Currently controlled per last in office reading close to goal <130/80 mmHg. Hesitant to push BP lower given ongoing dizziness. PCP Nadine Counts currently attempting to reduce dose of alprazolam in setting of dizziness- discussed side effect of medication with patient today.  - Reviewed long term cardiovascular and renal outcomes of uncontrolled blood pressure - Reviewed appropriate blood pressure monitoring technique and reviewed goal blood pressure. Recommended to check home blood pressure and heart rate at least weekly. - Recommend to continue amlodipine 5 mg daily, lisinopril 40 mg,     Follow Up Plan: PCP 04/15/23. Pharmacist telephone 06/03/23.  Nils Pyle, PharmD PGY1 Pharmacy Resident   Kieth Brightly, PharmD, BCACP Clinical Pharmacist, [facility]

## 2023-03-25 ENCOUNTER — Other Ambulatory Visit: Payer: Self-pay | Admitting: Family Medicine

## 2023-03-27 ENCOUNTER — Other Ambulatory Visit: Payer: Self-pay | Admitting: Family Medicine

## 2023-03-31 ENCOUNTER — Telehealth: Payer: Self-pay | Admitting: Family Medicine

## 2023-03-31 NOTE — Telephone Encounter (Signed)
Pt has been notified.

## 2023-03-31 NOTE — Telephone Encounter (Signed)
Please advise on dose.

## 2023-03-31 NOTE — Telephone Encounter (Signed)
Looks like julie put him up to 1mg  weekly.

## 2023-04-03 NOTE — Telephone Encounter (Signed)
Can you make sure novo nordisk knows it's Ozempic 1mg  weekly? I think they called here to specify

## 2023-04-04 ENCOUNTER — Other Ambulatory Visit: Payer: Self-pay | Admitting: Family Medicine

## 2023-04-04 ENCOUNTER — Other Ambulatory Visit (HOSPITAL_BASED_OUTPATIENT_CLINIC_OR_DEPARTMENT_OTHER): Payer: Self-pay | Admitting: Cardiology

## 2023-04-04 DIAGNOSIS — I5032 Chronic diastolic (congestive) heart failure: Secondary | ICD-10-CM

## 2023-04-04 NOTE — Telephone Encounter (Signed)
Rx request sent to pharmacy.  

## 2023-04-09 ENCOUNTER — Telehealth: Payer: Self-pay

## 2023-04-09 NOTE — Telephone Encounter (Signed)
Received patient assistance- Novofine 32G tip & tresiba U200  Patient aware to pick up  Placed in fridge to pick up

## 2023-04-13 ENCOUNTER — Other Ambulatory Visit: Payer: Self-pay | Admitting: Family Medicine

## 2023-04-13 DIAGNOSIS — R12 Heartburn: Secondary | ICD-10-CM

## 2023-04-15 ENCOUNTER — Ambulatory Visit (INDEPENDENT_AMBULATORY_CARE_PROVIDER_SITE_OTHER): Payer: Medicare Other | Admitting: Family Medicine

## 2023-04-15 ENCOUNTER — Encounter: Payer: Self-pay | Admitting: Family Medicine

## 2023-04-15 VITALS — BP 132/77 | HR 80 | Temp 98.6°F | Ht 72.0 in | Wt 287.0 lb

## 2023-04-15 DIAGNOSIS — E1159 Type 2 diabetes mellitus with other circulatory complications: Secondary | ICD-10-CM

## 2023-04-15 DIAGNOSIS — I152 Hypertension secondary to endocrine disorders: Secondary | ICD-10-CM | POA: Diagnosis not present

## 2023-04-15 DIAGNOSIS — F411 Generalized anxiety disorder: Secondary | ICD-10-CM | POA: Diagnosis not present

## 2023-04-15 DIAGNOSIS — E11621 Type 2 diabetes mellitus with foot ulcer: Secondary | ICD-10-CM | POA: Diagnosis not present

## 2023-04-15 DIAGNOSIS — H6123 Impacted cerumen, bilateral: Secondary | ICD-10-CM | POA: Diagnosis not present

## 2023-04-15 DIAGNOSIS — Z23 Encounter for immunization: Secondary | ICD-10-CM | POA: Diagnosis not present

## 2023-04-15 DIAGNOSIS — Z794 Long term (current) use of insulin: Secondary | ICD-10-CM | POA: Diagnosis not present

## 2023-04-15 DIAGNOSIS — L97511 Non-pressure chronic ulcer of other part of right foot limited to breakdown of skin: Secondary | ICD-10-CM

## 2023-04-15 DIAGNOSIS — E785 Hyperlipidemia, unspecified: Secondary | ICD-10-CM | POA: Diagnosis not present

## 2023-04-15 DIAGNOSIS — E1169 Type 2 diabetes mellitus with other specified complication: Secondary | ICD-10-CM | POA: Diagnosis not present

## 2023-04-15 MED ORDER — BUSPIRONE HCL 5 MG PO TABS: 5.0000 mg | ORAL_TABLET | Freq: Three times a day (TID) | ORAL | 0 refills | Status: DC

## 2023-04-15 NOTE — Progress Notes (Signed)
Subjective: CC:DM PCP: Raliegh Ip, DO GEX:[id number] C Castorina is a 72 y.o. male presenting to clinic today for:  1. GAD w/ benzodiazepine dependence Reduced to 1/2 tablet of xanax last visit.  Buspar continued at TID dosing and Mirtazapine 45mg  daily continued.  I reviewed with him that I had read he had run out of his alprazolam at 1 point but he notes that he has not.  He continues to only take half tablet daily with an occasional extra half if he is really feeling panicky.  He has not felt like his anxiety has been totally under control as it had been with regular usage of Xanax with the increased dose of BuSpar 3 times daily.  He is willing to go up on it though to try and get off of alprazolam because he does not want to have another withdrawal episode should he ever run out again.  He does report feeling slightly lightheaded and dizzy particularly when he is lying down he feels like he has water in his left ear.  This been ongoing for about 3 weeks now.  Does not report any drainage from the ear, falls.  He walks with a cane at baseline.   2. Type 2 Diabetes with hypertension, hyperlipidemia:  Managed by endocrinology.  Ozempic advanced since our last visit.  He continues medications as prescribed and reports no hypoglycemic episodes.  He does not follow a strict diet and reports to me that he has been eating Klondike bars lately.  He sees podiatry but they do not do a diabetic foot exam and he does not recall having had one recently with endocrinology.  Also has not had urine specimen done but willing to provide today if able  Diabetes Health Maintenance Due  Topic Date Due   OPHTHALMOLOGY EXAM  04/15/2023 (Originally 04/19/2020)   HEMOGLOBIN A1C  08/21/2023   FOOT EXAM  04/14/2024    Last A1c:  Lab Results  Component Value Date   HGBA1C 8.2 (A) 02/18/2023    ROS: Per HPI  Allergies  Allergen Reactions   Farxiga [Dapagliflozin] Other (See Comments)    Dizziness, passed  out/orthostasis   Past Medical History:  Diagnosis Date   Anxiety    Arthritis    Diabetes mellitus without complication (HCC)    GERD (gastroesophageal reflux disease)    Hypercholesteremia    Hypertension     Current Outpatient Medications:    ALPRAZolam (XANAX) 1 MG tablet, Take 0.5-1 tablets (0.5-1 mg total) by mouth daily as needed for anxiety (take ONLY IF NEEDED)., Disp: 90 tablet, Rfl: 1   amLODipine (NORVASC) 5 MG tablet, TAKE 1 TABLET (5 MG TOTAL) BY MOUTH DAILY., Disp: 90 tablet, Rfl: 0   apixaban (ELIQUIS) 5 MG TABS tablet, Take 1 tablet (5 mg total) by mouth 2 (two) times daily., Disp: 180 tablet, Rfl: 3   busPIRone (BUSPAR) 15 MG tablet, Take 1 tablet (15 mg total) by mouth 3 (three) times daily. For anxiety, Disp: 270 tablet, Rfl: 1   busPIRone (BUSPAR) 5 MG tablet, Take 1 tablet (5 mg total) by mouth 3 (three) times daily. Take with the Buspirone 15mg  to equal 20mg  3 times daily., Disp: 90 tablet, Rfl: 0   Continuous Blood Gluc Sensor (FREESTYLE LIBRE 14 DAY SENSOR) MISC, Scan to check glucose at least 4 times daily. DX: E11.4, Disp: 2 each, Rfl: 12   famotidine (PEPCID) 20 MG tablet, TAKE 1 TABLET BY MOUTH TWICE A DAY, Disp: 180 tablet, Rfl: 0  furosemide (LASIX) 20 MG tablet, TAKE 1 TABLET BY MOUTH EVERY DAY, Disp: 90 tablet, Rfl: 0   glucose blood (ONETOUCH VERIO) test strip, Use as instructed to monitor glucose 4 times daily (use as back up method for CGM), Disp: 100 each, Rfl: 12   insulin degludec (TRESIBA FLEXTOUCH) 200 UNIT/ML FlexTouch Pen, Inject 50 Units into the skin in the morning. (Patient taking differently: Inject 45 Units into the skin in the morning.), Disp: 18 mL, Rfl: 5   Insulin Pen Needle (B-D ULTRAFINE III SHORT PEN) 31G X 8 MM MISC, Use to give insulin daily Dx E11.9, Disp: 100 each, Rfl: 3   Lancets (ONETOUCH ULTRASOFT) lancets, Use as instructed to monitor glucose 4 times daily (use as back up method for CGM)., Disp: 100 each, Rfl: 12   lisinopril  (ZESTRIL) 40 MG tablet, TAKE 1 TABLET BY MOUTH EVERY DAY, Disp: 90 tablet, Rfl: 0   metFORMIN (GLUCOPHAGE-XR) 500 MG 24 hr tablet, TAKE 2 TABLETS BY MOUTH TWICE A DAY WITH FOOD, Disp: 120 tablet, Rfl: 0   mirtazapine (REMERON) 45 MG tablet, TAKE 1 TABLET BY MOUTH AT BEDTIME., Disp: 90 tablet, Rfl: 0   omeprazole (PRILOSEC) 20 MG capsule, Take 20 mg by mouth daily., Disp: , Rfl:    rosuvastatin (CRESTOR) 20 MG tablet, TAKE 1 TABLET BY MOUTH EVERYDAY AT BEDTIME, Disp: 90 tablet, Rfl: 0   Semaglutide, 1 MG/DOSE, (OZEMPIC, 1 MG/DOSE,) 2 MG/1.5ML SOPN, Inject 1 mg into the skin once a week. Obtains this from PAP Vanice Sarah, [facility] assisting with this), Disp: , Rfl:    sildenafil (VIAGRA) 50 MG tablet, Take 1 tablet (50 mg total) by mouth daily as needed for erectile dysfunction., Disp: 10 tablet, Rfl: 2 Social History   Socioeconomic History   Marital status: Divorced    Spouse name: Not on file   Number of children: Not on file   Years of education: Not on file   Highest education level: Not on file  Occupational History   Not on file  Tobacco Use   Smoking status: Former    Current packs/day: 0.00    Average packs/day: 0.5 packs/day for 41.0 years (20.5 ttl pk-yrs)    Types: Cigarettes    Start date: 03/01/1971    Quit date: 02/29/2012    Years since quitting: 11.1   Smokeless tobacco: Never  Vaping Use   Vaping status: Never Used  Substance and Sexual Activity   Alcohol use: No    Comment: quit 2012   Drug use: No    Comment: quit in 2012   Sexual activity: Yes    Birth control/protection: None  Other Topics Concern   Not on file  Social History Narrative   Not on file   Social Determinants of Health   Financial Resource Strain: Low Risk  (09/04/2022)   Overall Financial Resource Strain (CARDIA)    Difficulty of Paying Living Expenses: Not hard at all  Food Insecurity: No Food Insecurity (09/04/2022)   Hunger Vital Sign    Worried About Running Out of Food in the Last Year:  Never true    Ran Out of Food in the Last Year: Never true  Transportation Needs: No Transportation Needs (09/04/2022)   PRAPARE - Administrator, Civil Service (Medical): No    Lack of Transportation (Non-Medical): No  Physical Activity: Inactive (09/04/2022)   Exercise Vital Sign    Days of Exercise per Week: 0 days    Minutes of Exercise per Session:  0 min  Stress: No Stress Concern Present (09/04/2022)   [employer] of Occupational Health - Occupational Stress Questionnaire    Feeling of Stress : Not at all  Social Connections: Moderately Integrated (09/04/2022)   Social Connection and Isolation Panel [NHANES]    Frequency of Communication with Friends and Family: More than three times a week    Frequency of Social Gatherings with Friends and Family: More than three times a week    Attends Religious Services: More than 4 times per year    Active Member of [employer] or Organizations: Yes    Attends Banker Meetings: More than 4 times per year    Marital Status: Divorced  Intimate Partner Violence: Not At Risk (09/04/2022)   Humiliation, Afraid, Rape, and Kick questionnaire    Fear of Current or Ex-Partner: No    Emotionally Abused: No    Physically Abused: No    Sexually Abused: No   Family History  Problem Relation Age of Onset   Diabetes Mother    Glaucoma Mother     Objective: Office vital signs reviewed. BP 132/77   Pulse 80   Temp 98.6 F (37 C)   Ht 6' (1.829 m)   Wt 287 lb (130.2 kg)   SpO2 96%   BMI 38.92 kg/m   Physical Examination:  General: Awake, alert, well-appearing male.  No acute distress HEENT: TMs obscured by cerumen bilaterally, right worse than left Cardio: regular rate and rhythm, S1S2 heard, no murmurs appreciated Pulm: clear to auscultation bilaterally, no wheezes, rhonchi or rales; normal work of breathing on room air Psych: Mood stable, speech normal, affect appropriate.  Very pleasant, interactive  Diabetic Foot  Exam - Simple   Simple Foot Form Diabetic Foot exam was performed with the following findings: Yes 04/15/2023  3:21 PM  Visual Inspection See comments: Yes Sensation Testing See comments: Yes Pulse Check Posterior Tibialis and Dorsalis pulse intact bilaterally: Yes Comments He has what appears to be a small ulceration at the tip of the right great toe.  No active drainage or tenderness or erythema.  He has absent monofilament sensation in the plantar sites of the right and left foot and absent monofilament sensation in digit 3 bilaterally.  Onychomycotic changes to the nails bilaterally      Assessment/ Plan: 72 y.o. male   GAD (generalized anxiety disorder) - Plan: busPIRone (BUSPAR) 5 MG tablet  Bilateral impacted cerumen  Hypertension associated with diabetes (HCC) - Plan: Microalbumin / creatinine urine ratio  Type 2 diabetes mellitus with other specified complication, with long-term current use of insulin (HCC) - Plan: Microalbumin / creatinine urine ratio  Hyperlipidemia associated with type 2 diabetes mellitus (HCC) - Plan: Microalbumin / creatinine urine ratio  Diabetic ulcer of toe of right foot associated with type 2 diabetes mellitus, limited to breakdown of skin (HCC)  Encounter for immunization - Plan: Flu Vaccine Trivalent High Dose (Fluad)   Advance buspirone to 20 mg 3 times daily.  Still has a refill on alprazolam if needed but I would like him to continue to try and use this sparingly.  We discussed alternatively switching off of the buspirone onto something more long-acting like a sertraline or similar.  We will follow-up again in the next 3 to 4 weeks for reevaluation and revisit this if needed  Irrigation was attempted but we could not resolve the cerumen.  He will use Debrox until he is seen again in a few weeks and we can attempt  irrigation again  Urine microalbumin collected today.  I performed a diabetic foot exam which did demonstrate a small diabetic  ulcer at the tip of the right great toe.  He has an appoint with Dr. Ulice Brilliant soon.  We discussed surveillance of feet quite closely given absent monofilament sensation in several digits.  He was given a flu shot today as well  Raliegh Ip, DO [facility] [phone]

## 2023-04-15 NOTE — Patient Instructions (Signed)
I increased your buspirone to 20 mg daily.  You will take one of your 15 mg tablets of buspirone plus one of the 5 mg tablets of buspirone 3 times a day.  The new buspirone 5 mg has been sent to your pharmacy for pickup  You may still take half a tablet of the Xanax daily if you need it.  I will see you back in a few weeks and we will reevaluate how you have been doing on this increased dose of buspirone.  If you still do not feel like anxiety is well-controlled we can consider alternative medications.

## 2023-04-24 DIAGNOSIS — E1142 Type 2 diabetes mellitus with diabetic polyneuropathy: Secondary | ICD-10-CM | POA: Diagnosis not present

## 2023-04-24 DIAGNOSIS — M79676 Pain in unspecified toe(s): Secondary | ICD-10-CM | POA: Diagnosis not present

## 2023-04-24 DIAGNOSIS — B351 Tinea unguium: Secondary | ICD-10-CM | POA: Diagnosis not present

## 2023-04-24 DIAGNOSIS — L84 Corns and callosities: Secondary | ICD-10-CM | POA: Diagnosis not present

## 2023-04-27 ENCOUNTER — Other Ambulatory Visit: Payer: Self-pay | Admitting: Family Medicine

## 2023-05-07 ENCOUNTER — Ambulatory Visit (INDEPENDENT_AMBULATORY_CARE_PROVIDER_SITE_OTHER): Payer: Medicare Other | Admitting: Family Medicine

## 2023-05-07 ENCOUNTER — Encounter: Payer: Self-pay | Admitting: Family Medicine

## 2023-05-07 ENCOUNTER — Other Ambulatory Visit: Payer: Self-pay | Admitting: Family Medicine

## 2023-05-07 VITALS — BP 138/69 | HR 67 | Temp 98.6°F | Ht 72.0 in | Wt 290.0 lb

## 2023-05-07 DIAGNOSIS — Z794 Long term (current) use of insulin: Secondary | ICD-10-CM

## 2023-05-07 DIAGNOSIS — F1320 Sedative, hypnotic or anxiolytic dependence, uncomplicated: Secondary | ICD-10-CM

## 2023-05-07 DIAGNOSIS — F411 Generalized anxiety disorder: Secondary | ICD-10-CM

## 2023-05-07 DIAGNOSIS — E1169 Type 2 diabetes mellitus with other specified complication: Secondary | ICD-10-CM | POA: Diagnosis not present

## 2023-05-07 MED ORDER — ALPRAZOLAM 0.5 MG PO TABS: 0.2500 mg | ORAL_TABLET | Freq: Every day | ORAL | 1 refills | Status: DC | PRN

## 2023-05-07 MED ORDER — METFORMIN HCL ER 500 MG PO TB24: ORAL_TABLET | ORAL | 0 refills | Status: DC

## 2023-05-07 MED ORDER — MIRTAZAPINE 45 MG PO TABS: 45.0000 mg | ORAL_TABLET | Freq: Every day | ORAL | 3 refills | Status: DC

## 2023-05-07 MED ORDER — BUSPIRONE HCL 15 MG PO TABS
15.0000 mg | ORAL_TABLET | Freq: Three times a day (TID) | ORAL | 1 refills | Status: DC
Start: 2023-05-07 — End: 2023-08-18

## 2023-05-07 MED ORDER — LISINOPRIL 40 MG PO TABS: 40.0000 mg | ORAL_TABLET | Freq: Every day | ORAL | 3 refills | Status: DC

## 2023-05-07 MED ORDER — ROSUVASTATIN CALCIUM 20 MG PO TABS: ORAL_TABLET | ORAL | 3 refills | Status: DC

## 2023-05-07 MED ORDER — BUSPIRONE HCL 5 MG PO TABS
5.0000 mg | ORAL_TABLET | Freq: Three times a day (TID) | ORAL | 3 refills | Status: DC
Start: 2023-05-07 — End: 2023-08-18

## 2023-05-07 NOTE — Progress Notes (Addendum)
Subjective: CC: follow up anxiety disorder PCP: Raliegh Ip, DO [name] is a 72 y.o. male presenting to clinic today for:  1. GAD with benzodiazepine dependence He has been typically taking ONLY 0.5mg  per day.  This is down from 1mg  TID of the xanax.  He does still have a little bit of shakiness in the morning but it seems to be relieved when he takes this tablet.  He has been compliant with the buspirone 20 mg 3 times daily and that does seem to be helping a lot with the breakthrough symptoms he was having before.  Having some nightmares and this seemed to onset after he started the mirtazapine.  He is never mentioned this but notes that he always forgot to tell me.   ROS: Per HPI  Allergies  Allergen Reactions   Farxiga [Dapagliflozin] Other (See Comments)    Dizziness, passed out/orthostasis   Past Medical History:  Diagnosis Date   Anxiety    Arthritis    Diabetes mellitus without complication (HCC)    GERD (gastroesophageal reflux disease)    Hypercholesteremia    Hypertension     Current Outpatient Medications:    ALPRAZolam (XANAX) 1 MG tablet, Take 0.5-1 tablets (0.5-1 mg total) by mouth daily as needed for anxiety (take ONLY IF NEEDED)., Disp: 90 tablet, Rfl: 1   amLODipine (NORVASC) 5 MG tablet, TAKE 1 TABLET (5 MG TOTAL) BY MOUTH DAILY., Disp: 90 tablet, Rfl: 0   apixaban (ELIQUIS) 5 MG TABS tablet, Take 1 tablet (5 mg total) by mouth 2 (two) times daily., Disp: 180 tablet, Rfl: 3   busPIRone (BUSPAR) 15 MG tablet, Take 1 tablet (15 mg total) by mouth 3 (three) times daily. For anxiety, Disp: 270 tablet, Rfl: 1   busPIRone (BUSPAR) 5 MG tablet, Take 1 tablet (5 mg total) by mouth 3 (three) times daily. Take with the Buspirone 15mg  to equal 20mg  3 times daily., Disp: 90 tablet, Rfl: 0   Continuous Blood Gluc Sensor (FREESTYLE LIBRE 14 DAY SENSOR) MISC, Scan to check glucose at least 4 times daily. DX: E11.4, Disp: 2 each, Rfl: 12   famotidine (PEPCID)  20 MG tablet, TAKE 1 TABLET BY MOUTH TWICE A DAY, Disp: 180 tablet, Rfl: 0   furosemide (LASIX) 20 MG tablet, TAKE 1 TABLET BY MOUTH EVERY DAY, Disp: 90 tablet, Rfl: 0   glucose blood (ONETOUCH VERIO) test strip, Use as instructed to monitor glucose 4 times daily (use as back up method for CGM), Disp: 100 each, Rfl: 12   insulin degludec (TRESIBA FLEXTOUCH) 200 UNIT/ML FlexTouch Pen, Inject 50 Units into the skin in the morning. (Patient taking differently: Inject 45 Units into the skin in the morning.), Disp: 18 mL, Rfl: 5   Insulin Pen Needle (B-D ULTRAFINE III SHORT PEN) 31G X 8 MM MISC, Use to give insulin daily Dx E11.9, Disp: 100 each, Rfl: 3   Lancets (ONETOUCH ULTRASOFT) lancets, Use as instructed to monitor glucose 4 times daily (use as back up method for CGM)., Disp: 100 each, Rfl: 12   lisinopril (ZESTRIL) 40 MG tablet, TAKE 1 TABLET BY MOUTH EVERY DAY, Disp: 90 tablet, Rfl: 0   metFORMIN (GLUCOPHAGE-XR) 500 MG 24 hr tablet, TAKE 2 TABLETS BY MOUTH TWICE A DAY WITH FOOD, Disp: 360 tablet, Rfl: 0   mirtazapine (REMERON) 45 MG tablet, TAKE 1 TABLET BY MOUTH AT BEDTIME., Disp: 90 tablet, Rfl: 0   omeprazole (PRILOSEC) 20 MG capsule, Take 20 mg by mouth daily.,  Disp: , Rfl:    rosuvastatin (CRESTOR) 20 MG tablet, TAKE 1 TABLET BY MOUTH EVERYDAY AT BEDTIME, Disp: 90 tablet, Rfl: 0   Semaglutide, 1 MG/DOSE, (OZEMPIC, 1 MG/DOSE,) 2 MG/1.5ML SOPN, Inject 1 mg into the skin once a week. Obtains this from PAP Vanice Sarah, [facility] assisting with this), Disp: , Rfl:    sildenafil (VIAGRA) 50 MG tablet, Take 1 tablet (50 mg total) by mouth daily as needed for erectile dysfunction., Disp: 10 tablet, Rfl: 2 Social History   Socioeconomic History   Marital status: Divorced    Spouse name: Not on file   Number of children: Not on file   Years of education: Not on file   Highest education level: Not on file  Occupational History   Not on file  Tobacco Use   Smoking status: Former    Current  packs/day: 0.00    Average packs/day: 0.5 packs/day for 41.0 years (20.5 ttl pk-yrs)    Types: Cigarettes    Start date: 03/01/1971    Quit date: 02/29/2012    Years since quitting: 11.1   Smokeless tobacco: Never  Vaping Use   Vaping status: Never Used  Substance and Sexual Activity   Alcohol use: No    Comment: quit 2012   Drug use: No    Comment: quit in 2012   Sexual activity: Yes    Birth control/protection: None  Other Topics Concern   Not on file  Social History Narrative   Not on file   Social Determinants of Health   Financial Resource Strain: Low Risk  (09/04/2022)   Overall Financial Resource Strain (CARDIA)    Difficulty of Paying Living Expenses: Not hard at all  Food Insecurity: No Food Insecurity (09/04/2022)   Hunger Vital Sign    Worried About Running Out of Food in the Last Year: Never true    Ran Out of Food in the Last Year: Never true  Transportation Needs: No Transportation Needs (09/04/2022)   PRAPARE - Administrator, Civil Service (Medical): No    Lack of Transportation (Non-Medical): No  Physical Activity: Inactive (09/04/2022)   Exercise Vital Sign    Days of Exercise per Week: 0 days    Minutes of Exercise per Session: 0 min  Stress: No Stress Concern Present (09/04/2022)   [employer] of Occupational Health - Occupational Stress Questionnaire    Feeling of Stress : Not at all  Social Connections: Moderately Integrated (09/04/2022)   Social Connection and Isolation Panel [NHANES]    Frequency of Communication with Friends and Family: More than three times a week    Frequency of Social Gatherings with Friends and Family: More than three times a week    Attends Religious Services: More than 4 times per year    Active Member of [employer] or Organizations: Yes    Attends Banker Meetings: More than 4 times per year    Marital Status: Divorced  Intimate Partner Violence: Not At Risk (09/04/2022)   Humiliation, Afraid, Rape, and  Kick questionnaire    Fear of Current or Ex-Partner: No    Emotionally Abused: No    Physically Abused: No    Sexually Abused: No   Family History  Problem Relation Age of Onset   Diabetes Mother    Glaucoma Mother     Objective: Office vital signs reviewed. BP 138/69   Pulse 67   Temp 98.6 F (37 C)   Ht 6' (1.829 m)  Wt 290 lb (131.5 kg)   SpO2 95%   BMI 39.33 kg/m   Physical Examination:  General: Awake, alert, well nourished, No acute distress HEENT: MMM Cardio: regular rate and rhythm, S1S2 heard, no murmurs appreciated Pulm: clear to auscultation bilaterally, no wheezes, rhonchi or rales; normal work of breathing on room air Psych: Mood stable, speech normal.  Does not appear to be responding to internal stimuli.  No tremor or asterixis appreciated  Assessment/ Plan: 72 y.o. male   GAD (generalized anxiety disorder) - Plan: ALPRAZolam (XANAX) 0.5 MG tablet, busPIRone (BUSPAR) 15 MG tablet, busPIRone (BUSPAR) 5 MG tablet  Benzodiazepine dependence (HCC) - Plan: busPIRone (BUSPAR) 15 MG tablet  Type 2 diabetes mellitus with other specified complication, with long-term current use of insulin (HCC) - Plan: Microalbumin / creatinine urine ratio  I have reduced his alprazolam to 0.5 mg since he has been successful with this and encouraged him to try and do 0.25 mg if able.  This has been postdated and placed on file.  This will replace the 1 mg prescription that was sent back in August.  Buspirone 20 mg have been renewed.  Advised to try and schedule this little later to see if perhaps this might help with some of the a.m. jitteriness he has been experiencing.  I will see him back in the next 3 months, sooner if concerns arise  National narcotic database reviewed and there were no red flags.  He is up-to-date on UDS and CSC  Did not discuss type 2 diabetes but has not had urine microalbumin so we will collect and CC endocrinology once resulted   Charles Minjares Hulen Skains,  DO [facility] [phone]

## 2023-05-07 NOTE — Patient Instructions (Signed)
I sent in the Alprazolam to be put on file.  You should be due for refill around 06/16/2023. Continue the Buspirone 20mg  3 times daily as prescribed for anxiety Try taking the night time dose a little later and see if this helps with your symptoms.

## 2023-05-07 NOTE — Addendum Note (Signed)
Addended by: Raliegh Ip on: 05/07/2023 03:39 PM   Modules accepted: Orders

## 2023-05-08 ENCOUNTER — Telehealth: Payer: Self-pay | Admitting: Pharmacist

## 2023-05-08 LAB — MICROALBUMIN / CREATININE URINE RATIO
Creatinine, Urine: 45.6 mg/dL
Microalb/Creat Ratio: 255 mg/g{creat} — ABNORMAL HIGH (ref 0–29)
Microalbumin, Urine: 116.4 ug/mL

## 2023-05-08 NOTE — Telephone Encounter (Signed)
   Patient stable on Eliquis twice daily for Afib.  He denies signs and symptoms of bleeding. CBC stable.   Eliquis 5mg  (#28 tabs) samples given to patient today. LOT#[id number], EXP 04/2024.  Patient does not qualify for the Eliquis medication assistance program and was also denied LIS/extra help.  CBC    Component Value Date/Time   WBC 5.9 11/07/2022 1028   RBC 5.46 11/07/2022 1028   HGB 14.3 11/07/2022 1028   HGB 14.1 11/06/2021 1214   HCT 46.5 11/07/2022 1028   HCT 45.7 11/06/2021 1214   PLT 156 11/07/2022 1028   PLT 252 11/06/2021 1214   MCV 85.2 11/07/2022 1028   MCV 79 11/06/2021 1214   MCH 26.2 11/07/2022 1028   MCHC 30.8 11/07/2022 1028   RDW 16.4 (H) 11/07/2022 1028   RDW 17.6 (H) 11/06/2021 1214   LYMPHSABS 1.3 08/16/2022 1020   LYMPHSABS 3.2 (H) 04/13/2020 0906   MONOABS 0.4 08/16/2022 1020   EOSABS 0.1 08/16/2022 1020   EOSABS 0.2 04/13/2020 0906   BASOSABS 0.0 08/16/2022 1020   BASOSABS 0.1 04/13/2020 0906     Kieth Brightly, PharmD, BCACP, CPP Clinical Pharmacist, [facility]

## 2023-05-29 ENCOUNTER — Telehealth: Payer: Self-pay | Admitting: Pharmacist

## 2023-05-29 DIAGNOSIS — E1169 Type 2 diabetes mellitus with other specified complication: Secondary | ICD-10-CM

## 2023-05-29 NOTE — Telephone Encounter (Signed)
Patient came in today with new libre 3 plus sensors from [facility] (now [facility])-- No reader was included & phone is not compatible Message sent to [facility] on parachute portal  May have to call [phone] for patient  New rx for [facility] 3 reader faxed to [phone]

## 2023-05-30 MED ORDER — FREESTYLE LIBRE 3 READER DEVI
1 refills | Status: AC
Start: 2023-05-30 — End: ?

## 2023-06-03 ENCOUNTER — Ambulatory Visit (INDEPENDENT_AMBULATORY_CARE_PROVIDER_SITE_OTHER): Payer: Medicare Other | Admitting: Pharmacist

## 2023-06-03 ENCOUNTER — Telehealth: Payer: Self-pay | Admitting: Pharmacist

## 2023-06-03 ENCOUNTER — Telehealth: Payer: Medicare Other

## 2023-06-03 DIAGNOSIS — I2510 Atherosclerotic heart disease of native coronary artery without angina pectoris: Secondary | ICD-10-CM

## 2023-06-03 DIAGNOSIS — I2583 Coronary atherosclerosis due to lipid rich plaque: Secondary | ICD-10-CM

## 2023-06-03 NOTE — Telephone Encounter (Signed)
Can you send me his PAP directly? Patient needs assistance filling out Ozempic 1mg  weekly, Tresiba 45 units daily

## 2023-06-09 ENCOUNTER — Other Ambulatory Visit (HOSPITAL_COMMUNITY): Payer: Self-pay

## 2023-06-10 ENCOUNTER — Encounter: Payer: Self-pay | Admitting: Pharmacist

## 2023-06-10 MED ORDER — ROSUVASTATIN CALCIUM 20 MG PO TABS: ORAL_TABLET | ORAL | 3 refills | Status: DC

## 2023-06-17 NOTE — Progress Notes (Deleted)
Pharmacy Medication Assistance Program Note    06/17/2023  Patient ID: [name], male   DOB: [birth year], 72 y.o.   MRN: [id number]     06/17/2023  Outreach Medication One  Initial Outreach Date (Medication One) 06/03/2023  Manufacturer Medication One [employer] [facility] Evaristo Bury  Dose of Evaristo Bury U200  Type of Radiographer, therapeutic Assistance  Date Application Sent to Prescriber 06/09/2023  Name of Prescriber Delynn Flavin  Date Application Received From Provider 06/10/2023  Date Application Submitted to Manufacturer 06/17/2023  Method Application Sent to Manufacturer Fax          06/17/2023  Outreach Medication Two  Initial Outreach Date (Medication Two) 06/03/2023  Manufacturer Medication Two Novo Nordisk  [employer] Ozempic  Dose of Ozempic 1MG   Type of Radiographer, therapeutic Assistance  Date Application Sent to Prescriber 06/09/2023  Name of Prescriber Delynn Flavin  Date Application Received From Provider 06/10/2023  Method Application Sent to Manufacturer Fax  Date Application Submitted to Manufacturer 06/17/2023     Siri Cole, CphT Pharmacy Patient Advocate

## 2023-06-22 ENCOUNTER — Other Ambulatory Visit: Payer: Self-pay | Admitting: Family Medicine

## 2023-06-23 ENCOUNTER — Encounter: Payer: Self-pay | Admitting: Nurse Practitioner

## 2023-06-23 ENCOUNTER — Ambulatory Visit: Payer: Medicare Other | Admitting: Nurse Practitioner

## 2023-06-23 VITALS — BP 124/76 | HR 72 | Ht 72.0 in | Wt 286.2 lb

## 2023-06-23 DIAGNOSIS — N182 Chronic kidney disease, stage 2 (mild): Secondary | ICD-10-CM

## 2023-06-23 DIAGNOSIS — Z7985 Long-term (current) use of injectable non-insulin antidiabetic drugs: Secondary | ICD-10-CM | POA: Diagnosis not present

## 2023-06-23 DIAGNOSIS — Z794 Long term (current) use of insulin: Secondary | ICD-10-CM

## 2023-06-23 DIAGNOSIS — I10 Essential (primary) hypertension: Secondary | ICD-10-CM | POA: Diagnosis not present

## 2023-06-23 DIAGNOSIS — E782 Mixed hyperlipidemia: Secondary | ICD-10-CM

## 2023-06-23 DIAGNOSIS — Z7984 Long term (current) use of oral hypoglycemic drugs: Secondary | ICD-10-CM

## 2023-06-23 DIAGNOSIS — E1122 Type 2 diabetes mellitus with diabetic chronic kidney disease: Secondary | ICD-10-CM

## 2023-06-23 LAB — POCT GLYCOSYLATED HEMOGLOBIN (HGB A1C): Hemoglobin A1C: 8.6 % — AB (ref 4.0–5.6)

## 2023-06-23 NOTE — Progress Notes (Signed)
Endocrinology Follow Up Note       06/23/2023, 1:30 PM   Subjective:    Patient ID: [name], male    DOB: [birth year].  [name] is being seen in follow up after being seen in consultation for management of currently uncontrolled symptomatic diabetes requested by  Raliegh Ip, DO.   Past Medical History:  Diagnosis Date   Anxiety    Arthritis    Diabetes mellitus without complication (HCC)    GERD (gastroesophageal reflux disease)    Hypercholesteremia    Hypertension     Past Surgical History:  Procedure Laterality Date   boils     removed form back of skull   CATARACT EXTRACTION W/PHACO Right 04/02/2013   Procedure: CATARACT EXTRACTION PHACO AND INTRAOCULAR LENS PLACEMENT (IOC);  Surgeon: Susa Simmonds, MD;  Location: [facility];  Service: Ophthalmology;  Laterality: Right;  CDE 7.00   CATARACT EXTRACTION W/PHACO Left 10/04/2013   Procedure: CATARACT EXTRACTION PHACO AND INTRAOCULAR LENS PLACEMENT (IOC);  Surgeon: Susa Simmonds, MD;  Location: [facility];  Service: Ophthalmology;  Laterality: Left;  CDE:1.46   COLONOSCOPY N/A 07/03/2015   Procedure: COLONOSCOPY;  Surgeon: Malissa Hippo, MD;  Location: [facility];  Service: Endoscopy;  Laterality: N/A;  830   EYE SURGERY Right    "valve replaced and stent placed" per pt to reroute the fluid in his eye   ROTATOR CUFF REPAIR Right     Social History   Socioeconomic History   Marital status: Divorced    Spouse name: Not on file   Number of children: Not on file   Years of education: Not on file   Highest education level: Not on file  Occupational History   Not on file  Tobacco Use   Smoking status: Former    Current packs/day: 0.00    Average packs/day: 0.5 packs/day for 41.0 years (20.5 ttl pk-yrs)    Types: Cigarettes    Start date: 03/01/1971    Quit date: 02/29/2012    Years since quitting: 11.3   Smokeless tobacco: Never   Vaping Use   Vaping status: Never Used  Substance and Sexual Activity   Alcohol use: No    Comment: quit 2012   Drug use: No    Comment: quit in 2012   Sexual activity: Yes    Birth control/protection: None  Other Topics Concern   Not on file  Social History Narrative   Not on file   Social Determinants of Health   Financial Resource Strain: Low Risk  (09/04/2022)   Overall Financial Resource Strain (CARDIA)    Difficulty of Paying Living Expenses: Not hard at all  Food Insecurity: No Food Insecurity (09/04/2022)   Hunger Vital Sign    Worried About Running Out of Food in the Last Year: Never true    Ran Out of Food in the Last Year: Never true  Transportation Needs: No Transportation Needs (09/04/2022)   PRAPARE - Administrator, Civil Service (Medical): No    Lack of Transportation (Non-Medical): No  Physical Activity: Inactive (09/04/2022)   Exercise Vital Sign    Days of Exercise per Week:  0 days    Minutes of Exercise per Session: 0 min  Stress: No Stress Concern Present (09/04/2022)   [employer] of Occupational Health - Occupational Stress Questionnaire    Feeling of Stress : Not at all  Social Connections: Moderately Integrated (09/04/2022)   Social Connection and Isolation Panel [NHANES]    Frequency of Communication with Friends and Family: More than three times a week    Frequency of Social Gatherings with Friends and Family: More than three times a week    Attends Religious Services: More than 4 times per year    Active Member of [employer] or Organizations: Yes    Attends Engineer, structural: More than 4 times per year    Marital Status: Divorced    Family History  Problem Relation Age of Onset   Diabetes Mother    Glaucoma Mother     Outpatient Encounter Medications as of 06/23/2023  Medication Sig   amLODipine (NORVASC) 5 MG tablet TAKE 1 TABLET (5 MG TOTAL) BY MOUTH DAILY.   apixaban (ELIQUIS) 5 MG TABS tablet Take 1 tablet (5 mg  total) by mouth 2 (two) times daily.   busPIRone (BUSPAR) 15 MG tablet Take 1 tablet (15 mg total) by mouth 3 (three) times daily. Take with 15mg  to equal 20mg  For anxiety   busPIRone (BUSPAR) 5 MG tablet Take 1 tablet (5 mg total) by mouth 3 (three) times daily. Take with the Buspirone 15mg  to equal 20mg  3 times daily.   Continuous Glucose Receiver (FREESTYLE LIBRE 3 READER) DEVI with compatible Freestyle Libre 3 sensor to monitor glucose continuously. DX: E11.65   famotidine (PEPCID) 20 MG tablet TAKE 1 TABLET BY MOUTH TWICE A DAY   furosemide (LASIX) 20 MG tablet TAKE 1 TABLET BY MOUTH EVERY DAY   glucose blood (ONETOUCH VERIO) test strip Use as instructed to monitor glucose 4 times daily (use as back up method for CGM)   insulin degludec (TRESIBA FLEXTOUCH) 200 UNIT/ML FlexTouch Pen Inject 50 Units into the skin in the morning. (Patient taking differently: Inject 45 Units into the skin in the morning.)   Insulin Pen Needle (B-D ULTRAFINE III SHORT PEN) 31G X 8 MM MISC Use to give insulin daily Dx E11.9   Lancets (ONETOUCH ULTRASOFT) lancets Use as instructed to monitor glucose 4 times daily (use as back up method for CGM).   lisinopril (ZESTRIL) 40 MG tablet Take 1 tablet (40 mg total) by mouth daily.   metFORMIN (GLUCOPHAGE-XR) 500 MG 24 hr tablet TAKE 2 TABLETS BY MOUTH TWICE A DAY WITH FOOD   mirtazapine (REMERON) 45 MG tablet Take 1 tablet (45 mg total) by mouth at bedtime.   omeprazole (PRILOSEC) 20 MG capsule Take 20 mg by mouth daily.   rosuvastatin (CRESTOR) 20 MG tablet Take 1 tablet (20 mg) by mouth daily   Semaglutide, 1 MG/DOSE, (OZEMPIC, 1 MG/DOSE,) 2 MG/1.5ML SOPN Inject 1 mg into the skin once a week. MED VIA NOVO NORDISK PATIENT ASSISTANCE PROGRAM Raynelle Fanning [place], [facility] assisting with this)   sildenafil (VIAGRA) 50 MG tablet Take 1 tablet (50 mg total) by mouth daily as needed for erectile dysfunction.   ALPRAZolam (XANAX) 0.5 MG tablet Take 0.5-1 tablets (0.25-0.5 mg total) by mouth  daily as needed for anxiety (take ONLY IF NEEDED). **dose change (Patient not taking: Reported on 06/23/2023)   [DISCONTINUED] amLODipine (NORVASC) 5 MG tablet TAKE 1 TABLET (5 MG TOTAL) BY MOUTH DAILY.   No facility-administered encounter medications on file as of  06/23/2023.    ALLERGIES: Allergies  Allergen Reactions   Farxiga [Dapagliflozin] Other (See Comments)    Dizziness, passed out/orthostasis    VACCINATION STATUS: Immunization History  Administered Date(s) Administered   Fluad Quad(high Dose 65+) 04/30/2017, 04/13/2019, 05/15/2022   Fluad Trivalent(High Dose 65+) 04/15/2023   Influenza Split 04/19/2014, 05/29/2020   Influenza, Seasonal, Injecte, Preservative Fre 05/09/2015   Influenza-Unspecified 05/18/2014, 05/09/2015, 04/22/2018, 05/24/2020, 05/19/2021   Moderna SARS-COV2 Booster Vaccination 12/13/2020   Moderna Sars-Covid-2 Vaccination 09/30/2019, 10/29/2019, 06/24/2020, 12/13/2020   Pneumococcal Conjugate-13 04/23/2018   Pneumococcal Polysaccharide-23 12/28/2015   Zoster Recombinant(Shingrix) 02/12/2022   Zoster, Live 01/04/2016    Diabetes He presents for his follow-up diabetic visit. He has type 2 diabetes mellitus. Onset time: diagnosed at approx age of 65. His disease course has been improving. There are no hypoglycemic associated symptoms. Associated symptoms include foot paresthesias. Pertinent negatives for diabetes include no blurred vision, no fatigue, no polydipsia, no polyuria, no visual change, no weakness and no weight loss. There are no hypoglycemic complications. Symptoms are improving. Diabetic complications include heart disease, impotence, nephropathy and retinopathy. Risk factors for coronary artery disease include diabetes mellitus, dyslipidemia, family history, hypertension, male sex, obesity, sedentary lifestyle and tobacco exposure. Current diabetic treatment includes insulin injections and oral agent (monotherapy) (and Ozempic). He is compliant  with treatment most of the time. His weight is fluctuating minimally. He is following a generally healthy diet. When asked about meal planning, he reported none. He has not had a previous visit with a dietitian. He rarely participates in exercise. His home blood glucose trend is decreasing steadily. His overall blood glucose range is 130-140 mg/dl. (He presents today with his CGM showing improved, mostly at goal glycemic profile.  His POCT A1c today was 8.6%, increasing from last visit of 8.2%.  Analysis of his CGM shows TIR 90%, TAR 10%, TBR 0%.  He denies any hypoglycemia.  He notes he is getting Ozempic from patient assistance through his PCP but is waiting on a shipment of Ozempic and may run out while he waits.) An ACE inhibitor/angiotensin II receptor blocker is being taken. He does not see a podiatrist.Eye exam is current.  Hyperlipidemia This is a chronic problem. The current episode started more than 1 year ago. The problem is controlled. Recent lipid tests were reviewed and are normal. Exacerbating diseases include chronic renal disease, diabetes and obesity. Factors aggravating his hyperlipidemia include fatty foods. Current antihyperlipidemic treatment includes statins. The current treatment provides moderate improvement of lipids. Compliance problems include adherence to diet and adherence to exercise.  Risk factors for coronary artery disease include diabetes mellitus, dyslipidemia, family history, male sex, hypertension, obesity and a sedentary lifestyle.  Hypertension This is a chronic problem. The current episode started more than 1 year ago. The problem has been resolved since onset. The problem is controlled. Pertinent negatives include no blurred vision. There are no associated agents to hypertension. Risk factors for coronary artery disease include diabetes mellitus, dyslipidemia, family history, male gender, obesity, sedentary lifestyle and smoking/tobacco exposure. Past treatments include  ACE inhibitors and calcium channel blockers. The current treatment provides moderate improvement. There are no compliance problems.  Hypertensive end-organ damage includes kidney disease, CAD/MI and retinopathy. Identifiable causes of hypertension include chronic renal disease.    Review of systems  Constitutional: + minimally fluctuating body weight,  current Body mass index is 38.82 kg/m., no fatigue, no subjective hyperthermia, no subjective hypothermia Eyes: + blurry vision (bilateral retinopathy), legally blind in right eye, no  xerophthalmia ENT: no sore throat, no nodules palpated in throat, no dysphagia/odynophagia, no hoarseness Cardiovascular: no chest pain, no shortness of breath, no palpitations, no leg swelling Respiratory: no cough, no shortness of breath Gastrointestinal: no nausea/vomiting/diarrhea Musculoskeletal: no muscle/joint aches, walks with cane for deconditioning Skin: no rashes, no hyperemia Neurological: no tremors, no numbness, no tingling, + intermittent dizziness-when changing positions Psychiatric: no depression, no anxiety   Objective:     BP 124/76 (BP Location: Left Arm, Patient Position: Sitting, Cuff Size: Large)   Pulse 72   Ht 6' (1.829 m)   Wt 286 lb 3.2 oz (129.8 kg)   BMI 38.82 kg/m   Wt Readings from Last 3 Encounters:  06/23/23 286 lb 3.2 oz (129.8 kg)  05/07/23 290 lb (131.5 kg)  04/15/23 287 lb (130.2 kg)     BP Readings from Last 3 Encounters:  06/23/23 124/76  05/07/23 138/69  04/15/23 132/77     Physical Exam- Limited  Constitutional:  Body mass index is 38.82 kg/m. , not in acute distress, normal state of mind Eyes:  EOMI, no exophthalmos, right eye opaque conjunctiva Neck: Supple Musculoskeletal: no gross deformities, strength intact in all four extremities, no gross restriction of joint movements, walks with cane for deconditioning Skin:  no rashes, no hyperemia Neurological: no tremor with outstretched  hands   Diabetic Foot Exam - Simple   No data filed    CMP ( most recent) CMP     Component Value Date/Time   NA 136 11/07/2022 1028   NA 145 (H) 11/23/2021 0933   K 4.1 11/07/2022 1028   CL 106 11/07/2022 1028   CO2 22 11/07/2022 1028   GLUCOSE 138 (H) 11/07/2022 1028   BUN 14 11/07/2022 1028   BUN 21 11/23/2021 0933   CREATININE 0.79 11/07/2022 1028   CALCIUM 9.1 11/07/2022 1028   PROT 8.4 (H) 08/16/2022 1020   PROT 7.4 06/11/2021 0958   ALBUMIN 3.7 08/16/2022 1020   ALBUMIN 4.1 06/11/2021 0958   AST 26 08/16/2022 1020   ALT 20 08/16/2022 1020   ALKPHOS 82 08/16/2022 1020   BILITOT 0.7 08/16/2022 1020   BILITOT 0.7 06/11/2021 0958   GFRNONAA >60 11/07/2022 1028   GFRAA 92 04/13/2020 0906     Diabetic Labs (most recent): Lab Results  Component Value Date   HGBA1C 8.6 (A) 06/23/2023   HGBA1C 8.2 (A) 02/18/2023   HGBA1C 9.1 (H) 08/27/2022   MICROALBUR 150 12/05/2020     Lipid Panel ( most recent) Lipid Panel     Component Value Date/Time   CHOL 103 06/11/2021 0958   TRIG 98 06/11/2021 0958   HDL 27 (L) 06/11/2021 0958   CHOLHDL 3.8 06/11/2021 0958   LDLCALC 57 06/11/2021 0958   LABVLDL 19 06/11/2021 0958      Lab Results  Component Value Date   TSH 0.781 08/16/2022   TSH 0.663 10/29/2021   TSH 0.280 (L) 07/05/2019   TSH 0.124 (L) 04/28/2019   TSH 0.059 (L) 04/06/2019           Assessment & Plan:   1) Uncontrolled Type 2 Diabetes with long-term use of insulin  - [name] has currently uncontrolled symptomatic type 2 DM since 72 years of age.   He presents today with his CGM showing improved, mostly at goal glycemic profile.  His POCT A1c today was 8.6%, increasing from last visit of 8.2%.  Analysis of his CGM shows TIR 90%, TAR 10%, TBR 0%.  He denies  any hypoglycemia.  He notes he is getting Ozempic from patient assistance through his PCP but is waiting on a shipment of Ozempic and may run out while he waits.  -Recent labs  reviewed.  - I had a long discussion with him about the progressive nature of diabetes and the pathology behind its complications. -his diabetes is complicated by CKD, CAD and he remains at a high risk for more acute and chronic complications which include CAD, CVA, CKD, retinopathy, and neuropathy. These are all discussed in detail with him.  - Nutritional counseling repeated at each appointment due to patients tendency to fall back in to old habits.  - The patient admits there is a room for improvement in their diet and drink choices. -  Suggestion is made for the patient to avoid simple carbohydrates from their diet including Cakes, Sweet Desserts / Pastries, Ice Cream, Soda (diet and regular), Sweet Tea, Candies, Chips, Cookies, Sweet Pastries, Store Bought Juices, Alcohol in Excess of 1-2 drinks a day, Artificial Sweeteners, Coffee Creamer, and "Sugar-free" Products. This will help patient to have stable blood glucose profile and potentially avoid unintended weight gain.   - I encouraged the patient to switch to unprocessed or minimally processed complex starch and increased protein intake (animal or plant source), fruits, and vegetables.   - Patient is advised to stick to a routine mealtimes to eat 3 meals a day and avoid unnecessary snacks (to snack only to correct hypoglycemia).  - I have approached him with the following individualized plan to manage  his diabetes and patient agrees:   -Given his improved, at goal glycemic profile overall and lack of hypoglycemia, he can continue his Tresiba 45 units SQ nightly (may help efforts to lose weight) and continue the higher dose of Ozempic 1 mg SW weekly (from PAP set up by his PCP) and continue Metformin 500 mg ER twice daily with meals.  I did give him sample Ozempic 0.5 mg SQ weekly to use until his next Ozempic shipment comes in from PAP.  -he is encouraged to continue using his CGM to monitor glucose 4 times daily, before meals and before  bed, and to call the clinic if he has readings less than 70 or greater than 300 for 3 tests in a row.  He notes he was sent a libre 3 sensor but did not have the meter to go with it.  He notes his PCP, pharmacist is helping to get him a new reader to work with the Aristes 3 sensors.  He could certainly benefit from the upgrade given the alarm features they have.  - he is warned not to take insulin without proper monitoring per orders. - Adjustment parameters are given to him for hypo and hyperglycemia in writing.  - Specific targets for  A1c;  LDL, HDL,  and Triglycerides were discussed with the patient.  2) Blood Pressure /Hypertension:  his blood pressure is controlled to target.   he is advised to continue his current medications as prescribed by his PCP.  3) Lipids/Hyperlipidemia:    Review of his recent lipid panel from 06/11/21 showed controlled LDL at 57 .  he  is advised to continue Crestor 20 mg daily at bedtime.  Side effects and precautions discussed with him.   4)  Weight/Diet:  his Body mass index is 38.82 kg/m.  -  clearly complicating his diabetes care.   he is a candidate for weight loss. I discussed with him the fact that loss of  5 - 10% of his  current body weight will have the most impact on his diabetes management.  Exercise, and detailed carbohydrates information provided  -  detailed on discharge instructions.  5) Chronic Care/Health Maintenance: -he is on ACEI/ARB and Statin medications and is encouraged to initiate and continue to follow up with Ophthalmology, Dentist, Podiatrist at least yearly or according to recommendations, and advised to stay away from smoking. I have recommended yearly flu vaccine and pneumonia vaccine at least every 5 years; moderate intensity exercise for up to 150 minutes weekly; and sleep for at least 7 hours a day.  - he is advised to maintain close follow up with Raliegh Ip, DO for primary care needs, as well as his other providers for  optimal and coordinated care.     I spent  31  minutes in the care of the patient today including review of labs from CMP, Lipids, Thyroid Function, Hematology (current and previous including abstractions from other facilities); face-to-face time discussing  his blood glucose readings/logs, discussing hypoglycemia and hyperglycemia episodes and symptoms, medications doses, his options of short and long term treatment based on the latest standards of care / guidelines;  discussion about incorporating lifestyle medicine;  and documenting the encounter. Risk reduction counseling performed per USPSTF guidelines to reduce obesity and cardiovascular risk factors.     Please refer to Patient Instructions for Blood Glucose Monitoring and Insulin/Medications Dosing Guide"  in media tab for additional information. Please  also refer to " Patient Self Inventory" in the Media  tab for reviewed elements of pertinent patient history.  [name] participated in the discussions, expressed understanding, and voiced agreement with the above plans.  All questions were answered to his satisfaction. he is encouraged to contact clinic should he have any questions or concerns prior to his return visit.   Follow up plan: - Return in about 4 months (around 10/21/2023) for Diabetes F/U with A1c in office, No previsit labs, Bring meter and logs.  Ronny Bacon, [facility] [address] [place], [place] [address] Phone: [phone] Fax: [phone]  06/23/2023, 1:30 PM

## 2023-06-25 ENCOUNTER — Other Ambulatory Visit (HOSPITAL_COMMUNITY): Payer: Self-pay

## 2023-07-02 NOTE — Progress Notes (Signed)
Pharmacy Medication Assistance Program Note    07/02/2023  Patient ID: CHAISON GUILLORY, male   DOB: 02-18-1951, 72 y.o.   MRN: [id number]     06/17/2023  Outreach Medication One  Initial Outreach Date (Medication One) 06/03/2023  Manufacturer Medication One [employer] [facility] Evaristo Bury  Dose of Evaristo Bury U200  Type of Radiographer, therapeutic Assistance  Date Application Sent to Prescriber 06/09/2023  Name of Prescriber Delynn Flavin  Date Application Received From Provider 06/10/2023  Date Application Submitted to Manufacturer 06/17/2023  Method Application Sent to Manufacturer Fax  Patient Assistance Determination Approved  Approval Start Date 06/30/2023  Approval End Date 08/04/2024           06/17/2023  Outreach Medication Two  Initial Outreach Date (Medication Two) 06/03/2023  Manufacturer Medication Two Novo Nordisk  [employer]ord[employer] [facility] Ozempic  Dose of Ozempic 1MG   Type of Radiographer, therapeutic Assistance  Date Application Sent to Prescriber 06/09/2023  Name of Prescriber Delynn Flavin  Date Application Received From Provider 06/10/2023  Method Application Sent to Manufacturer Fax  Date Application Submitted to Manufacturer 06/17/2023  Patient Assistance Determination Approved  Approval Start Date 06/30/2023       Medications will ship to [facility].

## 2023-07-04 ENCOUNTER — Other Ambulatory Visit (HOSPITAL_BASED_OUTPATIENT_CLINIC_OR_DEPARTMENT_OTHER): Payer: Self-pay | Admitting: Cardiology

## 2023-07-04 DIAGNOSIS — I5032 Chronic diastolic (congestive) heart failure: Secondary | ICD-10-CM

## 2023-07-08 ENCOUNTER — Other Ambulatory Visit (HOSPITAL_COMMUNITY): Payer: Self-pay

## 2023-07-08 ENCOUNTER — Encounter: Payer: Self-pay | Admitting: Family Medicine

## 2023-07-08 ENCOUNTER — Other Ambulatory Visit: Payer: Self-pay | Admitting: *Deleted

## 2023-07-08 DIAGNOSIS — I5032 Chronic diastolic (congestive) heart failure: Secondary | ICD-10-CM

## 2023-07-08 MED ORDER — FUROSEMIDE 20 MG PO TABS
20.0000 mg | ORAL_TABLET | Freq: Every day | ORAL | 0 refills | Status: DC
Start: 2023-07-08 — End: 2023-09-29
  Filled 2023-07-08: qty 30, 30d supply, fill #0

## 2023-07-11 ENCOUNTER — Other Ambulatory Visit: Payer: Self-pay | Admitting: Family Medicine

## 2023-07-11 DIAGNOSIS — R12 Heartburn: Secondary | ICD-10-CM

## 2023-07-14 ENCOUNTER — Other Ambulatory Visit: Payer: Self-pay

## 2023-07-30 ENCOUNTER — Other Ambulatory Visit: Payer: Self-pay

## 2023-07-30 ENCOUNTER — Emergency Department (HOSPITAL_COMMUNITY)
Admission: EM | Admit: 2023-07-30 | Discharge: 2023-07-30 | Disposition: A | Discharge status: Home / Self Care | Payer: Medicare Other | Attending: Emergency Medicine | Admitting: Emergency Medicine

## 2023-07-30 ENCOUNTER — Encounter (HOSPITAL_COMMUNITY): Payer: Self-pay | Admitting: Emergency Medicine

## 2023-07-30 DIAGNOSIS — R5383 Other fatigue: Secondary | ICD-10-CM | POA: Diagnosis not present

## 2023-07-30 DIAGNOSIS — Z1152 Encounter for screening for COVID-19: Secondary | ICD-10-CM | POA: Insufficient documentation

## 2023-07-30 DIAGNOSIS — E119 Type 2 diabetes mellitus without complications: Secondary | ICD-10-CM | POA: Diagnosis not present

## 2023-07-30 DIAGNOSIS — R531 Weakness: Secondary | ICD-10-CM | POA: Diagnosis not present

## 2023-07-30 DIAGNOSIS — Z794 Long term (current) use of insulin: Secondary | ICD-10-CM | POA: Insufficient documentation

## 2023-07-30 DIAGNOSIS — Z7984 Long term (current) use of oral hypoglycemic drugs: Secondary | ICD-10-CM | POA: Insufficient documentation

## 2023-07-30 DIAGNOSIS — I10 Essential (primary) hypertension: Secondary | ICD-10-CM | POA: Diagnosis not present

## 2023-07-30 DIAGNOSIS — Z79899 Other long term (current) drug therapy: Secondary | ICD-10-CM | POA: Insufficient documentation

## 2023-07-30 DIAGNOSIS — Z7901 Long term (current) use of anticoagulants: Secondary | ICD-10-CM | POA: Insufficient documentation

## 2023-07-30 DIAGNOSIS — Z7985 Long-term (current) use of injectable non-insulin antidiabetic drugs: Secondary | ICD-10-CM | POA: Diagnosis not present

## 2023-07-30 LAB — CBC WITH DIFFERENTIAL/PLATELET
Abs Immature Granulocytes: 0.02 10*3/uL (ref 0.00–0.07)
Basophils Absolute: 0 10*3/uL (ref 0.0–0.1)
Basophils Relative: 1 %
Eosinophils Absolute: 0.2 10*3/uL (ref 0.0–0.5)
Eosinophils Relative: 3 %
HCT: 47.4 % (ref 39.0–52.0)
Hemoglobin: 14.8 g/dL (ref 13.0–17.0)
Immature Granulocytes: 0 %
Lymphocytes Relative: 37 %
Lymphs Abs: 2.4 10*3/uL (ref 0.7–4.0)
MCH: 26.8 pg (ref 26.0–34.0)
MCHC: 31.2 g/dL (ref 30.0–36.0)
MCV: 85.7 fL (ref 80.0–100.0)
Monocytes Absolute: 0.4 10*3/uL (ref 0.1–1.0)
Monocytes Relative: 7 %
Neutro Abs: 3.4 10*3/uL (ref 1.7–7.7)
Neutrophils Relative %: 52 %
Platelets: 262 10*3/uL (ref 150–400)
RBC: 5.53 MIL/uL (ref 4.22–5.81)
RDW: 16.2 % — ABNORMAL HIGH (ref 11.5–15.5)
WBC: 6.5 10*3/uL (ref 4.0–10.5)
nRBC: 0 % (ref 0.0–0.2)

## 2023-07-30 LAB — URINALYSIS, ROUTINE W REFLEX MICROSCOPIC
Bacteria, UA: NONE SEEN
Bilirubin Urine: NEGATIVE
Glucose, UA: 50 mg/dL — AB
Ketones, ur: NEGATIVE mg/dL
Nitrite: NEGATIVE
Protein, ur: 100 mg/dL — AB
Specific Gravity, Urine: 1.016 (ref 1.005–1.030)
pH: 6 (ref 5.0–8.0)

## 2023-07-30 LAB — COMPREHENSIVE METABOLIC PANEL
ALT: 28 U/L (ref 0–44)
AST: 29 U/L (ref 15–41)
Albumin: 3.9 g/dL (ref 3.5–5.0)
Alkaline Phosphatase: 67 U/L (ref 38–126)
Anion gap: 11 (ref 5–15)
BUN: 16 mg/dL (ref 8–23)
CO2: 23 mmol/L (ref 22–32)
Calcium: 9.6 mg/dL (ref 8.9–10.3)
Chloride: 106 mmol/L (ref 98–111)
Creatinine, Ser: 0.94 mg/dL (ref 0.61–1.24)
GFR, Estimated: >60 mL/min (ref >60)
Glucose, Bld: 145 mg/dL — ABNORMAL HIGH (ref 70–99)
Potassium: 4.3 mmol/L (ref 3.5–5.1)
Sodium: 140 mmol/L (ref 135–145)
Total Bilirubin: 0.5 mg/dL (ref <1.2)
Total Protein: 8.7 g/dL — ABNORMAL HIGH (ref 6.5–8.1)

## 2023-07-30 LAB — RESP PANEL BY RT-PCR (RSV, FLU A&B, COVID)  RVPGX2
Influenza A by PCR: NEGATIVE
Influenza B by PCR: NEGATIVE
Resp Syncytial Virus by PCR: NEGATIVE
SARS Coronavirus 2 by RT PCR: NEGATIVE

## 2023-07-30 LAB — TROPONIN I (HIGH SENSITIVITY): Troponin I (High Sensitivity): 9 ng/L (ref <18)

## 2023-07-30 NOTE — Discharge Instructions (Signed)
History hydrated and start taking 1 multivitamin a day.  Follow-up with your doctor next week for recheck

## 2023-07-30 NOTE — ED Provider Notes (Signed)
[place] [facility]GENC[facility] DEPARTMENT AT [facility] Provider Note   CSN: [id number] Arrival date & time: 07/30/23  1030     History  Chief Complaint  Patient presents with   Weakness    [name] is a 73 y.o. male.  Patient has a history of diabetes and hypertension and complains of weakness.  He has seen his doctor before about this and this seems to be more of a chronic problem  The history is provided by the patient and medical records. No language interpreter was used.  Weakness Severity:  Moderate Onset quality:  Gradual Timing:  Constant Progression:  Waxing and waning Chronicity:  Recurrent Context: not alcohol use   Relieved by:  Nothing Worsened by:  Nothing Ineffective treatments:  None tried Associated symptoms: no abdominal pain, no chest pain, no cough, no diarrhea, no frequency, no headaches and no seizures        Home Medications Prior to Admission medications   Medication Sig Start Date End Date Taking? Authorizing Provider  ALPRAZolam (XANAX) 0.5 MG tablet Take 0.5-1 tablets (0.25-0.5 mg total) by mouth daily as needed for anxiety (take ONLY IF NEEDED). **dose change Patient not taking: Reported on 06/23/2023 06/16/23   Raliegh Ip, DO  amLODipine (NORVASC) 5 MG tablet TAKE 1 TABLET (5 MG TOTAL) BY MOUTH DAILY. 06/23/23   Raliegh Ip, DO  apixaban (ELIQUIS) 5 MG TABS tablet Take 1 tablet (5 mg total) by mouth 2 (two) times daily. 01/01/23 12/27/23  Raliegh Ip, DO  busPIRone (BUSPAR) 15 MG tablet Take 1 tablet (15 mg total) by mouth 3 (three) times daily. Take with 15mg  to equal 20mg  For anxiety 05/07/23   Delynn Flavin M, DO  busPIRone (BUSPAR) 5 MG tablet Take 1 tablet (5 mg total) by mouth 3 (three) times daily. Take with the Buspirone 15mg  to equal 20mg  3 times daily. 05/07/23   Raliegh Ip, DO  Continuous Glucose Receiver (FREESTYLE LIBRE 3 READER) DEVI with compatible Freestyle Libre 3 sensor to monitor  glucose continuously. DX: E11.65 05/30/23   Delynn Flavin M, DO  famotidine (PEPCID) 20 MG tablet TAKE 1 TABLET BY MOUTH TWICE A DAY 07/11/23   Delynn Flavin M, DO  furosemide (LASIX) 20 MG tablet Take 1 tablet (20 mg total) by mouth daily. Pt needs yearly appt for any # 90 day supply or future refills. Please call office to schedule appt. 07/08/23   Jodelle Red, MD  glucose blood (ONETOUCH VERIO) test strip Use as instructed to monitor glucose 4 times daily (use as back up method for CGM) 04/10/21   Reardon, Freddi Starr, NP  insulin degludec (TRESIBA FLEXTOUCH) 200 UNIT/ML FlexTouch Pen Inject 50 Units into the skin in the morning. Patient taking differently: Inject 45 Units into the skin in the morning. 03/18/22   Dani Gobble, NP  Insulin Pen Needle (B-D ULTRAFINE III SHORT PEN) 31G X 8 MM MISC Use to give insulin daily Dx E11.9 06/09/19   Sonny Masters, FNP  Lancets [facility] ULTRASOFT) lancets Use as instructed to monitor glucose 4 times daily (use as back up method for CGM). 04/10/21   Dani Gobble, NP  lisinopril (ZESTRIL) 40 MG tablet Take 1 tablet (40 mg total) by mouth daily. 05/07/23   Delynn Flavin M, DO  metFORMIN (GLUCOPHAGE-XR) 500 MG 24 hr tablet TAKE 2 TABLETS BY MOUTH TWICE A DAY WITH FOOD 05/07/23   Delynn Flavin M, DO  mirtazapine (REMERON) 45 MG tablet Take 1  tablet (45 mg total) by mouth at bedtime. 05/07/23   Raliegh Ip, DO  omeprazole (PRILOSEC) 20 MG capsule Take 20 mg by mouth daily.    [provider]  rosuvastatin (CRESTOR) 20 MG tablet Take 1 tablet (20 mg) by mouth daily 06/10/23   Delynn Flavin M, DO  Semaglutide, 1 MG/DOSE, (OZEMPIC, 1 MG/DOSE,) 2 MG/1.5ML SOPN Inject 1 mg into the skin once a week. MED VIA [employer] PATIENT ASSISTANCE PROGRAM Raynelle Fanning [place], [facility] assisting with this)    [provider]  sildenafil (VIAGRA) 50 MG tablet Take 1 tablet (50 mg total) by mouth daily as needed for erectile dysfunction.  08/16/20   Raliegh Ip, DO      Allergies    Farxiga [dapagliflozin]    Review of Systems   Review of Systems  Constitutional:  Negative for appetite change and fatigue.  HENT:  Negative for congestion, ear discharge and sinus pressure.   Eyes:  Negative for discharge.  Respiratory:  Negative for cough.   Cardiovascular:  Negative for chest pain.  Gastrointestinal:  Negative for abdominal pain and diarrhea.  Genitourinary:  Negative for frequency and hematuria.  Musculoskeletal:  Negative for back pain.  Skin:  Negative for rash.  Neurological:  Positive for weakness. Negative for seizures and headaches.  Psychiatric/Behavioral:  Negative for hallucinations.     Physical Exam Updated Vital Signs BP (!) 142/92   Pulse (!) 53   Temp 98.6 F (37 C) (Oral)   Resp 18   SpO2 95%  Physical Exam Vitals and nursing note reviewed.  Constitutional:      Appearance: He is well-developed.  HENT:     Head: Normocephalic.     Nose: Nose normal.  Eyes:     General: No scleral icterus.    Conjunctiva/sclera: Conjunctivae normal.  Neck:     Thyroid: No thyromegaly.  Cardiovascular:     Rate and Rhythm: Normal rate and regular rhythm.     Heart sounds: No murmur heard.    No friction rub. No gallop.  Pulmonary:     Breath sounds: No stridor. No wheezing or rales.  Chest:     Chest wall: No tenderness.  Abdominal:     General: There is no distension.     Tenderness: There is no abdominal tenderness. There is no rebound.  Musculoskeletal:        General: Normal range of motion.     Cervical back: Neck supple.  Lymphadenopathy:     Cervical: No cervical adenopathy.  Skin:    Findings: No erythema or rash.  Neurological:     Mental Status: He is alert and oriented to person, place, and time.     Motor: No abnormal muscle tone.     Coordination: Coordination normal.  Psychiatric:        Behavior: Behavior normal.     ED Results / Procedures / Treatments    Labs (all labs ordered are listed, but only abnormal results are displayed) Labs Reviewed  CBC WITH DIFFERENTIAL/PLATELET - Abnormal; Notable for the following components:      Result Value   RDW 16.2 (*)    All other components within normal limits  COMPREHENSIVE METABOLIC PANEL - Abnormal; Notable for the following components:   Glucose, Bld 145 (*)    Total Protein 8.7 (*)    All other components within normal limits  URINALYSIS, ROUTINE W REFLEX MICROSCOPIC - Abnormal; Notable for the following components:   Glucose, UA 50 (*)  Hgb urine dipstick SMALL (*)    Protein, ur 100 (*)    Leukocytes,Ua TRACE (*)    All other components within normal limits  RESP PANEL BY RT-PCR (RSV, FLU A&B, COVID)  [id number]  TROPONIN I (HIGH SENSITIVITY)    EKG None  Radiology No results found.  Procedures Procedures    Medications Ordered in ED Medications - No data to display  ED Course/ Medical Decision Making/ A&P                                 Medical Decision Making Amount and/or Complexity of Data Reviewed Labs: ordered. ECG/medicine tests: ordered.  This patient presents to the ED for concern of fatigue, this involves an extensive number of treatment options, and is a complaint that carries with it a high risk of complications and morbidity.  The differential diagnosis includes poorly controlled diabetes, hypothyroidism   Co morbidities that complicate the patient evaluation  Hypertension and diabetes   Additional history obtained:  Additional history obtained from patient External records from outside source obtained and reviewed including hospital records   Lab Tests:  I Ordered, and personally interpreted labs.  The pertinent results include: CBC, chemistries, urinalysis all negative   Imaging Studies ordered: No imaging Cardiac Monitoring: / EKG:  The patient was maintained on a cardiac monitor.  I personally viewed and interpreted the cardiac monitored  which showed an underlying rhythm of: Normal sinus rhythm   Consultations Obtained:  No consultant  Problem List / ED Course / Critical interventions / Medication management  Diabetes hypertension and fatigue None Reevaluation of the patient after these medicines showed that the patient stayed the same I have reviewed the patients home medicines and have made adjustments as needed   Social Determinants of Health:  None   Test / Admission - Considered:  None  General weakness and mild dehydration.  Patient will follow-up with PCP.  He is also told to start a multivitamin daily        Final Clinical Impression(s) / ED Diagnoses Final diagnoses:  Weakness    Rx / DC Orders ED Discharge Orders     None         Bethann Berkshire, MD 08/01/23 1015

## 2023-07-30 NOTE — ED Triage Notes (Addendum)
f °

## 2023-07-30 NOTE — ED Triage Notes (Signed)
Pt from home c/o generalized weakness and inability to walk due to generalized weakness that started 0630 this morning. Endorses dizziness, states he has been feeling unwell for a week but unable to stand due to being weak today. Denies HA, denies any pain, denies recent falls, pt A&O x4 denies shob. States "I just feel weak all over"

## 2023-07-31 ENCOUNTER — Telehealth: Payer: Self-pay

## 2023-07-31 NOTE — Transitions of Care (Post Inpatient/ED Visit) (Signed)
 07/31/2023  Name: [name] MRN: [id number] DOB: 1951-06-27  Today's TOC FU Call Status: Today's TOC FU Call Status:: Successful TOC FU Call Completed TOC FU Call Complete Date: 07/31/23 Patient's Name and Date of Birth confirmed.  Transition Care Management Follow-up Telephone Call Date of Discharge: 07/30/23 [facility] Type of Discharge: Emergency Department Reason for ED Visit: Other: (weakness) How have you been since you were released from the hospital?: Same Any questions or concerns?: No  Items Reviewed: Did you receive and understand the discharge instructions provided?: Yes Medications obtained,verified, and reconciled?: Yes (Medications Reviewed) Any new allergies since your discharge?: No Dietary orders reviewed?: NA Do you have support at home?: No  Medications Reviewed Today: Medications Reviewed Today     Reviewed by Cynai Skeens, Jordan Hawks, CMA (Certified Medical Assistant) on 07/31/23 at 1400  Med List Status: <None>   Medication Order Taking? Sig Documenting Provider Last Dose Status Informant  ALPRAZolam (XANAX) 0.5 MG tablet [id number] No Take 0.5-1 tablets (0.25-0.5 mg total) by mouth daily as needed for anxiety (take ONLY IF NEEDED). **dose change  Patient not taking: Reported on 06/23/2023   Raliegh Ip, DO Not Taking Active   amLODipine (NORVASC) 5 MG tablet [id number] No TAKE 1 TABLET (5 MG TOTAL) BY MOUTH DAILY. Delynn Flavin M, DO Taking Active   apixaban (ELIQUIS) 5 MG TABS tablet [id number] No Take 1 tablet (5 mg total) by mouth 2 (two) times daily. Delynn Flavin M, DO Taking Active   busPIRone (BUSPAR) 15 MG tablet [id number] No Take 1 tablet (15 mg total) by mouth 3 (three) times daily. Take with 15mg  to equal 20mg  For anxiety Delynn Flavin M, DO Taking Active   busPIRone (BUSPAR) 5 MG tablet [id number] No Take 1 tablet (5 mg total) by mouth 3 (three) times daily. Take with the Buspirone 15mg  to equal 20mg  3  times daily. Raliegh Ip, DO Taking Active   Continuous Glucose Receiver (FREESTYLE LIBRE 3 READER) DEVI [id number] No with compatible Freestyle Libre 3 sensor to monitor glucose continuously. DX: E11.65 Raliegh Ip, DO Taking Active            Med Note Particia Lather   Tue Jun 03, 2023 10:13 AM) Still using FL 14-d sensor. Insurance will not cover FL3 reader.   famotidine (PEPCID) 20 MG tablet [id number]  TAKE 1 TABLET BY MOUTH TWICE A DAY Gottschalk, Ashly M, DO  Active   furosemide (LASIX) 20 MG tablet [id number]  Take 1 tablet (20 mg total) by mouth daily. Pt needs yearly appt for any # 90 day supply or future refills. Please call office to schedule appt. Jodelle Red, MD  Active   glucose blood [facility] VERIO) test strip [id number] No Use as instructed to monitor glucose 4 times daily (use as back up method for CGM) Dani Gobble, NP Taking Active Self  insulin degludec (TRESIBA FLEXTOUCH) 200 UNIT/ML FlexTouch Pen [id number] No Inject 50 Units into the skin in the morning.  Patient taking differently: Inject 45 Units into the skin in the morning.   Dani Gobble, NP Taking Active            Med Note Danella Maiers   Tue Sep 10, 2022  9:42 AM) Via novo nordisk patient assistance program    Insulin Pen Needle (B-D ULTRAFINE III SHORT PEN) 31G X 8 MM MISC [id number] No Use to give insulin daily Dx E11.9 Sonny Masters, FNP Taking Active Self  Lancets [facility] ULTRASOFT) lancets [id number] No Use as instructed to monitor glucose 4 times daily (use as back up method for CGM). Dani Gobble, NP Taking Active Self  lisinopril (ZESTRIL) 40 MG tablet [id number] No Take 1 tablet (40 mg total) by mouth daily. Delynn Flavin M, DO Taking Active   metFORMIN (GLUCOPHAGE-XR) 500 MG 24 hr tablet [id number] No TAKE 2 TABLETS BY MOUTH TWICE A DAY WITH FOOD Delynn Flavin M, DO Taking Active   mirtazapine (REMERON) 45 MG tablet [id number] No Take 1 tablet (45 mg total) by  mouth at bedtime. Delynn Flavin M, DO Taking Active   omeprazole (PRILOSEC) 20 MG capsule [id number] No Take 20 mg by mouth daily. [provider] Taking Active Self  rosuvastatin (CRESTOR) 20 MG tablet [id number] No Take 1 tablet (20 mg) by mouth daily Gottschalk, Ashly M, DO Taking Active   Semaglutide, 1 MG/DOSE, (OZEMPIC, 1 MG/DOSE,) 2 MG/1.5ML SOPN [id number] No Inject 1 mg into the skin once a week. MED VIA NOV[employer] PATIENT ASSISTANCE PROGRAM Raynelle Fanning [place], [facility] assisting with this) [provider] Taking Active            Med Note Shona Needles   Mon Jun 23, 2023  1:07 PM) Patient states that he is injecting 1 mg weekly  sildenafil (VIAGRA) 50 MG tablet [id number] No Take 1 tablet (50 mg total) by mouth daily as needed for erectile dysfunction. Raliegh Ip, DO Taking Active Self  Med List Note Milana Obey 03/31/13 1303): Lisinopril (strength)             Home Care and Equipment/Supplies: Were Home Health Services Ordered?: NA Any new equipment or medical supplies ordered?: NA  Functional Questionnaire: Do you need assistance with bathing/showering or dressing?: No Do you need assistance with meal preparation?: No Do you need assistance with eating?: No Do you have difficulty maintaining continence: No Do you need assistance with getting out of bed/getting out of a chair/moving?: No Do you have difficulty managing or taking your medications?: No  Follow up appointments reviewed: PCP Follow-up appointment confirmed?: Yes Date of PCP follow-up appointment?: 08/04/23 Follow-up Provider: Jannifer Rodney, FNP (DOD this date) Specialist Hospital Follow-up appointment confirmed?: NA Do you need transportation to your follow-up appointment?: No Do you understand care options if your condition(s) worsen?: Yes-patient verbalized understanding     Hanad Leino, CMA  [facility] AWV Team Direct Dial: [phone]

## 2023-08-04 ENCOUNTER — Encounter: Payer: Self-pay | Admitting: Family

## 2023-08-04 ENCOUNTER — Ambulatory Visit: Payer: Medicare Other

## 2023-08-04 ENCOUNTER — Ambulatory Visit (INDEPENDENT_AMBULATORY_CARE_PROVIDER_SITE_OTHER): Payer: Medicare Other | Admitting: Family

## 2023-08-04 VITALS — BP 117/74 | HR 77 | Temp 97.9°F | Ht 72.0 in | Wt 289.0 lb

## 2023-08-04 DIAGNOSIS — E1169 Type 2 diabetes mellitus with other specified complication: Secondary | ICD-10-CM | POA: Diagnosis not present

## 2023-08-04 DIAGNOSIS — E1159 Type 2 diabetes mellitus with other circulatory complications: Secondary | ICD-10-CM

## 2023-08-04 DIAGNOSIS — Z09 Encounter for follow-up examination after completed treatment for conditions other than malignant neoplasm: Secondary | ICD-10-CM

## 2023-08-04 DIAGNOSIS — I152 Hypertension secondary to endocrine disorders: Secondary | ICD-10-CM | POA: Diagnosis not present

## 2023-08-04 DIAGNOSIS — R531 Weakness: Secondary | ICD-10-CM | POA: Diagnosis not present

## 2023-08-04 DIAGNOSIS — Z794 Long term (current) use of insulin: Secondary | ICD-10-CM | POA: Diagnosis not present

## 2023-08-04 NOTE — Progress Notes (Signed)
Subjective:    Patient ID: [name], male    DOB: 1950/10/27, 72 y.o.   MRN: [id number]  Pt presents to the office today for hospital follow up. He went to the ED on 07/30/23 for weakness. Reports he rolled over in bed and started feeling dizzy, weak, and light headed. He was diagnosed with dehydration.  He has uncontrolled  DM and his last A1C was 8.6. Diabetes He presents for his follow-up diabetic visit. He has type 2 diabetes mellitus. Hypoglycemia symptoms include dizziness. Pertinent negatives for diabetes include no blurred vision and no foot paresthesias. Symptoms are stable. Risk factors for coronary artery disease include dyslipidemia, diabetes mellitus, hypertension and sedentary lifestyle. He is following a generally unhealthy diet. His overall blood glucose range is 110-130 mg/dl.  Hypertension This is a chronic problem. The current episode started more than 1 year ago. The problem has been waxing and waning since onset. The problem is uncontrolled. Associated symptoms include malaise/fatigue and peripheral edema. Pertinent negatives include no blurred vision. Risk factors for coronary artery disease include diabetes mellitus, male gender, dyslipidemia and obesity. The current treatment provides mild improvement.      Review of Systems  Constitutional:  Positive for malaise/fatigue.  Eyes:  Negative for blurred vision.  Neurological:  Positive for dizziness.  All other systems reviewed and are negative.   Social History   Socioeconomic History   Marital status: Divorced    Spouse name: Not on file   Number of children: Not on file   Years of education: Not on file   Highest education level: Not on file  Occupational History   Not on file  Tobacco Use   Smoking status: Former    Current packs/day: 0.00    Average packs/day: 0.5 packs/day for 41.0 years (20.5 ttl pk-yrs)    Types: Cigarettes    Start date: 03/01/1971    Quit date: 02/29/2012    Years since  quitting: 11.4   Smokeless tobacco: Never  Vaping Use   Vaping status: Never Used  Substance and Sexual Activity   Alcohol use: No    Comment: quit 2012   Drug use: No    Comment: quit in 2012   Sexual activity: Yes    Birth control/protection: None  Other Topics Concern   Not on file  Social History Narrative   Not on file   Social Drivers of Health   Financial Resource Strain: Low Risk  (09/04/2022)   Overall Financial Resource Strain (CARDIA)    Difficulty of Paying Living Expenses: Not hard at all  Food Insecurity: No Food Insecurity (09/04/2022)   Hunger Vital Sign    Worried About Running Out of Food in the Last Year: Never true    Ran Out of Food in the Last Year: Never true  Transportation Needs: No Transportation Needs (09/04/2022)   PRAPARE - Administrator, Civil Service (Medical): No    Lack of Transportation (Non-Medical): No  Physical Activity: Inactive (09/04/2022)   Exercise Vital Sign    Days of Exercise per Week: 0 days    Minutes of Exercise per Session: 0 min  Stress: No Stress Concern Present (09/04/2022)   [employer] of Occupational Health - Occupational Stress Questionnaire    Feeling of Stress : Not at all  Social Connections: Moderately Integrated (09/04/2022)   Social Connection and Isolation Panel [NHANES]    Frequency of Communication with Friends and Family: More than three times a week  Frequency of Social Gatherings with Friends and Family: More than three times a week    Attends Religious Services: More than 4 times per year    Active Member of [employer] or Organizations: Yes    Attends Engineer, structural: More than 4 times per year    Marital Status: Divorced   Family History  Problem Relation Age of Onset   Diabetes Mother    Glaucoma Mother         Objective:   Physical Exam Vitals reviewed.  Constitutional:      General: He is not in acute distress.    Appearance: He is well-developed. He is obese.   HENT:     Head: Normocephalic.     Right Ear: Tympanic membrane and external ear normal.     Left Ear: Tympanic membrane and external ear normal.  Eyes:     General:        Right eye: No discharge.        Left eye: No discharge.     Pupils: Pupils are equal, round, and reactive to light.  Neck:     Thyroid: No thyromegaly.  Cardiovascular:     Rate and Rhythm: Normal rate and regular rhythm.     Heart sounds: Normal heart sounds. No murmur heard. Pulmonary:     Effort: Pulmonary effort is normal. No respiratory distress.     Breath sounds: Normal breath sounds. No wheezing.  Abdominal:     General: Bowel sounds are normal. There is no distension.     Palpations: Abdomen is soft.     Tenderness: There is no abdominal tenderness.  Musculoskeletal:        General: No tenderness. Normal range of motion.     Cervical back: Normal range of motion and neck supple.  Skin:    General: Skin is warm and dry.     Findings: No erythema or rash.  Neurological:     Mental Status: He is alert and oriented to person, place, and time.     Cranial Nerves: No cranial nerve deficit.     Deep Tendon Reflexes: Reflexes are normal and symmetric.  Psychiatric:        Behavior: Behavior normal.        Thought Content: Thought content normal.        Judgment: Judgment normal.       BP 117/74   Pulse 77   Temp 97.9 F (36.6 C)   Ht 6' (1.829 m)   Wt 289 lb (131.1 kg)   SpO2 96%   BMI 39.20 kg/m      Assessment & Plan:  [name] comes in today with chief complaint of Hospitalization Follow-up (General weakness and mild dehydration. When in the bed turns over he gets swimming headed. Gets nervous. )   Diagnosis and orders addressed:  1. Weakness (Primary) - CBC with Differential/Platelet - CMP14+EGFR  2. Type 2 diabetes mellitus with other specified complication, with long-term current use of insulin (HCC) - CBC with Differential/Platelet - CMP14+EGFR  3. Hypertension  associated with diabetes (HCC) - CBC with Differential/Platelet - CMP14+EGFR  4. Hospital discharge follow-up - CBC with Differential/Platelet - CMP14+EGFR   Labs pending Force fluids Continue current medications  Keep follow up with specialists  Health Maintenance reviewed Diet and exercise encouraged  Return if symptoms worsen or fail to improve, for Keep follow up with PCP.    Jannifer Rodney, FNP

## 2023-08-04 NOTE — Progress Notes (Signed)
Unable to do eye exam due to patient's eyes not dilating

## 2023-08-04 NOTE — Patient Instructions (Signed)

## 2023-08-05 LAB — CBC WITH DIFFERENTIAL/PLATELET
Basophils Absolute: 0.1 10*3/uL (ref 0.0–0.2)
Basos: 1 %
EOS (ABSOLUTE): 0.2 10*3/uL (ref 0.0–0.4)
Eos: 2 %
Hematocrit: 44.6 % (ref 37.5–51.0)
Hemoglobin: 14.3 g/dL (ref 13.0–17.7)
Immature Grans (Abs): 0 10*3/uL (ref 0.0–0.1)
Immature Granulocytes: 0 %
Lymphocytes Absolute: 2.7 10*3/uL (ref 0.7–3.1)
Lymphs: 33 %
MCH: 26.7 pg (ref 26.6–33.0)
MCHC: 32.1 g/dL (ref 31.5–35.7)
MCV: 83 fL (ref 79–97)
Monocytes Absolute: 0.6 10*3/uL (ref 0.1–0.9)
Monocytes: 8 %
Neutrophils Absolute: 4.6 10*3/uL (ref 1.4–7.0)
Neutrophils: 56 %
Platelets: 263 10*3/uL (ref 150–450)
RBC: 5.36 x10E6/uL (ref 4.14–5.80)
RDW: 15.6 % — ABNORMAL HIGH (ref 11.6–15.4)
WBC: 8.2 10*3/uL (ref 3.4–10.8)

## 2023-08-05 LAB — CMP14+EGFR
ALT: 25 [IU]/L (ref 0–44)
AST: 22 [IU]/L (ref 0–40)
Albumin: 4.3 g/dL (ref 3.8–4.8)
Alkaline Phosphatase: 84 [IU]/L (ref 44–121)
BUN/Creatinine Ratio: 19 (ref 10–24)
BUN: 19 mg/dL (ref 8–27)
Bilirubin Total: 0.3 mg/dL (ref 0.0–1.2)
CO2: 24 mmol/L (ref 20–29)
Calcium: 9.6 mg/dL (ref 8.6–10.2)
Chloride: 106 mmol/L (ref 96–106)
Creatinine, Ser: 1 mg/dL (ref 0.76–1.27)
Globulin, Total: 3.5 g/dL (ref 1.5–4.5)
Glucose: 158 mg/dL — ABNORMAL HIGH (ref 70–99)
Potassium: 4.8 mmol/L (ref 3.5–5.2)
Sodium: 145 mmol/L — ABNORMAL HIGH (ref 134–144)
Total Protein: 7.8 g/dL (ref 6.0–8.5)
eGFR: 80 mL/min/{1.73_m2} (ref >59)

## 2023-08-18 ENCOUNTER — Ambulatory Visit (INDEPENDENT_AMBULATORY_CARE_PROVIDER_SITE_OTHER): Payer: Medicare Other | Admitting: Family Medicine

## 2023-08-18 ENCOUNTER — Encounter: Payer: Self-pay | Admitting: Family Medicine

## 2023-08-18 VITALS — BP 119/66 | HR 82 | Temp 98.3°F | Ht 72.0 in | Wt 291.0 lb

## 2023-08-18 DIAGNOSIS — F411 Generalized anxiety disorder: Secondary | ICD-10-CM

## 2023-08-18 DIAGNOSIS — F1320 Sedative, hypnotic or anxiolytic dependence, uncomplicated: Secondary | ICD-10-CM

## 2023-08-18 MED ORDER — BUSPIRONE HCL 15 MG PO TABS: 15.0000 mg | ORAL_TABLET | Freq: Three times a day (TID) | ORAL | 1 refills | Status: DC

## 2023-08-18 MED ORDER — ALPRAZOLAM 0.5 MG PO TABS: 0.2500 mg | ORAL_TABLET | Freq: Every evening | ORAL | 1 refills | Status: DC | PRN

## 2023-08-18 MED ORDER — METFORMIN HCL ER 500 MG PO TB24: ORAL_TABLET | ORAL | 0 refills | Status: DC

## 2023-08-18 MED ORDER — BUSPIRONE HCL 5 MG PO TABS: 5.0000 mg | ORAL_TABLET | Freq: Three times a day (TID) | ORAL | 3 refills | Status: DC

## 2023-08-18 MED ORDER — AMLODIPINE BESYLATE 5 MG PO TABS: 5.0000 mg | ORAL_TABLET | Freq: Every day | ORAL | 3 refills | Status: DC

## 2023-08-18 NOTE — Progress Notes (Signed)
 Subjective: CC: Follow-up anxiety with panic PCP: Jolinda Norene HERO, DO YEP:Ijcpi C [name] is a 73 y.o. male presenting to clinic today for:  1.  Anxiety disorder with panic attacks Patient reports that he has been taking the BuSpar  20 mg 3 times daily as prescribed.  He was not successful in reducing the Xanax  to 0.25 mg and has been consistently taking the 0.5 mg daily because he starts having anxiety attacks in the morning and feels tremulous on the inside.  This does alleviate the symptoms.  He reports no falls.  No excessive daytime sedation.  No memory changes.  He is compliant with mirtazapine  45 mg nightly.   ROS: Per HPI  Allergies  Allergen Reactions   Farxiga  [Dapagliflozin ] Other (See Comments)    Dizziness, passed out/orthostasis   Past Medical History:  Diagnosis Date   Anxiety    Arthritis    Diabetes mellitus without complication (HCC)    GERD (gastroesophageal reflux disease)    Hypercholesteremia    Hypertension     Current Outpatient Medications:    amLODipine  (NORVASC ) 5 MG tablet, TAKE 1 TABLET (5 MG TOTAL) BY MOUTH DAILY., Disp: 90 tablet, Rfl: 0   apixaban  (ELIQUIS ) 5 MG TABS tablet, Take 1 tablet (5 mg total) by mouth 2 (two) times daily., Disp: 180 tablet, Rfl: 3   busPIRone  (BUSPAR ) 15 MG tablet, Take 1 tablet (15 mg total) by mouth 3 (three) times daily. Take with 15mg  to equal 20mg  For anxiety, Disp: 270 tablet, Rfl: 1   busPIRone  (BUSPAR ) 5 MG tablet, Take 1 tablet (5 mg total) by mouth 3 (three) times daily. Take with the Buspirone  15mg  to equal 20mg  3 times daily., Disp: 270 tablet, Rfl: 3   Continuous Glucose Receiver (FREESTYLE LIBRE 3 READER) DEVI, with compatible Freestyle Libre 3 sensor to monitor glucose continuously. DX: E11.65, Disp: 1 each, Rfl: 1   famotidine  (PEPCID ) 20 MG tablet, TAKE 1 TABLET BY MOUTH TWICE A DAY, Disp: 180 tablet, Rfl: 0   furosemide  (LASIX ) 20 MG tablet, Take 1 tablet (20 mg total) by mouth daily. Pt needs yearly appt  for any # 90 day supply or future refills. Please call office to schedule appt., Disp: 30 tablet, Rfl: 0   glucose blood (ONETOUCH VERIO) test strip, Use as instructed to monitor glucose 4 times daily (use as back up method for CGM), Disp: 100 each, Rfl: 12   insulin  degludec (TRESIBA  FLEXTOUCH) 200 UNIT/ML FlexTouch Pen, Inject 50 Units into the skin in the morning. (Patient taking differently: Inject 45 Units into the skin in the morning.), Disp: 18 mL, Rfl: 5   Insulin  Pen Needle (B-D ULTRAFINE III SHORT PEN) 31G X 8 MM MISC, Use to give insulin  daily Dx E11.9, Disp: 100 each, Rfl: 3   Lancets (ONETOUCH ULTRASOFT) lancets, Use as instructed to monitor glucose 4 times daily (use as back up method for CGM)., Disp: 100 each, Rfl: 12   lisinopril  (ZESTRIL ) 40 MG tablet, Take 1 tablet (40 mg total) by mouth daily., Disp: 90 tablet, Rfl: 3   metFORMIN  (GLUCOPHAGE -XR) 500 MG 24 hr tablet, TAKE 2 TABLETS BY MOUTH TWICE A DAY WITH FOOD, Disp: 360 tablet, Rfl: 0   mirtazapine  (REMERON ) 45 MG tablet, Take 1 tablet (45 mg total) by mouth at bedtime., Disp: 90 tablet, Rfl: 3   omeprazole (PRILOSEC) 20 MG capsule, Take 20 mg by mouth daily., Disp: , Rfl:    rosuvastatin  (CRESTOR ) 20 MG tablet, Take 1 tablet (20 mg) by  mouth daily, Disp: 90 tablet, Rfl: 3   Semaglutide, 1 MG/DOSE, (OZEMPIC, 1 MG/DOSE,) 2 MG/1.5ML SOPN, Inject 1 mg into the skin once a week. MED VIA NOVO NORDISK PATIENT ASSISTANCE PROGRAM [name], [facility] assisting with this), Disp: , Rfl:    sildenafil  (VIAGRA ) 50 MG tablet, Take 1 tablet (50 mg total) by mouth daily as needed for erectile dysfunction., Disp: 10 tablet, Rfl: 2 Social History   Socioeconomic History   Marital status: Divorced    Spouse name: Not on file   Number of children: Not on file   Years of education: Not on file   Highest education level: Not on file  Occupational History   Not on file  Tobacco Use   Smoking status: Former    Current packs/day: 0.00    Average  packs/day: 0.5 packs/day for 41.0 years (20.5 ttl pk-yrs)    Types: Cigarettes    Start date: 03/01/1971    Quit date: 02/29/2012    Years since quitting: 11.4   Smokeless tobacco: Never  Vaping Use   Vaping status: Never Used  Substance and Sexual Activity   Alcohol use: No    Comment: quit 2012   Drug use: No    Comment: quit in 2012   Sexual activity: Yes    Birth control/protection: None  Other Topics Concern   Not on file  Social History Narrative   Not on file   Social Drivers of Health   Financial Resource Strain: Low Risk  (09/04/2022)   Overall Financial Resource Strain (CARDIA)    Difficulty of Paying Living Expenses: Not hard at all  Food Insecurity: No Food Insecurity (09/04/2022)   Hunger Vital Sign    Worried About Running Out of Food in the Last Year: Never true    Ran Out of Food in the Last Year: Never true  Transportation Needs: No Transportation Needs (09/04/2022)   PRAPARE - Administrator, Civil Service (Medical): No    Lack of Transportation (Non-Medical): No  Physical Activity: Inactive (09/04/2022)   Exercise Vital Sign    Days of Exercise per Week: 0 days    Minutes of Exercise per Session: 0 min  Stress: No Stress Concern Present (09/04/2022)   [employer] of Occupational Health - Occupational Stress Questionnaire    Feeling of Stress : Not at all  Social Connections: Moderately Integrated (09/04/2022)   Social Connection and Isolation Panel [NHANES]    Frequency of Communication with Friends and Family: More than three times a week    Frequency of Social Gatherings with Friends and Family: More than three times a week    Attends Religious Services: More than 4 times per year    Active Member of [employer] or Organizations: Yes    Attends Banker Meetings: More than 4 times per year    Marital Status: Divorced  Intimate Partner Violence: Not At Risk (09/04/2022)   Humiliation, Afraid, Rape, and Kick questionnaire    Fear of  Current or Ex-Partner: No    Emotionally Abused: No    Physically Abused: No    Sexually Abused: No   Family History  Problem Relation Age of Onset   Diabetes Mother    Glaucoma Mother     Objective: Office vital signs reviewed. BP 119/66   Pulse 82   Temp 98.3 F (36.8 C)   Ht 6' (1.829 m)   Wt 291 lb (132 kg)   SpO2 96%   BMI 39.47 kg/m  Physical Examination:  General: Awake, alert, nontoxic elderly male, No acute distress Cardio: regular rate and rhythm, S1S2 heard, no murmurs appreciated Pulm: clear to auscultation bilaterally, no wheezes, rhonchi or rales; normal work of breathing on room air      08/18/2023    2:38 PM 04/15/2023    2:06 PM 01/13/2023    2:13 PM  Depression screen PHQ 2/9  Decreased Interest 1 0 0  Down, Depressed, Hopeless 2 0 0  PHQ - 2 Score 3 0 0  Altered sleeping 1 0 0  Tired, decreased energy 0 0 0  Change in appetite 1 0 0  Feeling bad or failure about yourself  0 0 0  Trouble concentrating 0 0 0  Moving slowly or fidgety/restless 1 0 0  Suicidal thoughts 0 0 0  PHQ-9 Score 6 0 0  Difficult doing work/chores Somewhat difficult Not difficult at all Not difficult at all      08/18/2023    2:37 PM 04/15/2023    2:06 PM 01/13/2023    2:13 PM 12/02/2022    1:18 PM  GAD 7 : Generalized Anxiety Score  Nervous, Anxious, on Edge 0 0 0 0  Control/stop worrying 1 0 0 0  Worry too much - different things 0 0 0 0  Trouble relaxing 1 0 1 0  Restless 0 0 0 0  Easily annoyed or irritable 0 0 0 0  Afraid - awful might happen 0 0 0 0  Total GAD 7 Score 2 0 1 0  Anxiety Difficulty  Not difficult at all Not difficult at all Not difficult at all   Assessment/ Plan: 73 y.o. male   GAD (generalized anxiety disorder) - Plan: busPIRone  (BUSPAR ) 15 MG tablet, busPIRone  (BUSPAR ) 5 MG tablet, ALPRAZolam  (XANAX ) 0.5 MG tablet  Benzodiazepine dependence (HCC) - Plan: busPIRone  (BUSPAR ) 15 MG tablet, ALPRAZolam  (XANAX ) 0.5 MG tablet  Continue buspirone ,  mirtazapine  as prescribed.  It sounds like we should hold him here at the 0.5 mg of alprazolam  for a little bit longer.  We can certainly revisit reducing him down to 0.25 mg daily which was what our original plan was.  I reviewed the national narcotic database and there were no red flags.  He is up-to-date on UDS and CSC.  We will plan to see each other back again in 3 months, sooner if concerns arise   Sonakshi Rolland CHRISTELLA Fielding, DO [facility] [phone]

## 2023-09-09 ENCOUNTER — Ambulatory Visit: Payer: Medicare Other

## 2023-09-09 VITALS — Ht 72.0 in | Wt 291.0 lb

## 2023-09-09 DIAGNOSIS — Z0000 Encounter for general adult medical examination without abnormal findings: Secondary | ICD-10-CM | POA: Diagnosis not present

## 2023-09-09 NOTE — Patient Instructions (Addendum)
Mr. Edell , Thank you for taking time to come for your Medicare Wellness Visit. I appreciate your ongoing commitment to your health goals. Please review the following plan we discussed and let me know if I can assist you in the future.   Referrals/Orders/Follow-Ups/Clinician Recommendations: Aim for 30 minutes of exercise or brisk walking, 6-8 glasses of water, and 5 servings of fruits and vegetables each day.  This is a list of the screening recommended for you and due dates:  Health Maintenance  Topic Date Due   Eye exam for diabetics  04/19/2020   COVID-19 Vaccine (5 - 2024-25 season) 04/06/2023   Zoster (Shingles) Vaccine (2 of 2) 11/16/2023*   Screening for Lung Cancer  05/06/2024*   DTaP/Tdap/Td vaccine (1 - Tdap) 08/17/2024*   Hemoglobin A1C  12/21/2023   Complete foot exam   04/14/2024   Yearly kidney health urinalysis for diabetes  05/06/2024   Yearly kidney function blood test for diabetes  08/03/2024   Medicare Annual Wellness Visit  09/08/2024   Colon Cancer Screening  07/02/2025   Pneumonia Vaccine  Completed   Flu Shot  Completed   Hepatitis C Screening  Completed   HPV Vaccine  Aged Out  *Topic was postponed. The date shown is not the original due date.    Advanced directives: (ACP Link)Information on Advanced Care Planning can be found at [facility] [place] Advance Health Care Directives Advance Health Care Directives ([email or web address]   Next Medicare Annual Wellness Visit scheduled for next year: Yes

## 2023-09-09 NOTE — Progress Notes (Signed)
 Subjective:   [name] is a 73 y.o. male who presents for Medicare Annual/Subsequent preventive examination.  Visit Complete: Virtual I connected with  [name] on 09/09/23 by a audio enabled telemedicine application and verified that I am speaking with the correct person using two identifiers.  Patient Location: Home  Provider Location: Home Office  This patient declined Interactive audio and video telecommunications. Therefore the visit was completed with audio only.  I discussed the limitations of evaluation and management by telemedicine. The patient expressed understanding and agreed to proceed.  Vital Signs: Because this visit was a virtual/telehealth visit, some criteria may be missing or patient reported. Any vitals not documented were not able to be obtained and vitals that have been documented are patient reported.  Cardiac Risk Factors include: advanced age (>58men, >58 women);diabetes mellitus;hypertension;male gender     Objective:    Today's Vitals   09/09/23 1257  Weight: 291 lb (132 kg)  Height: 6' (1.829 m)   Body mass index is 39.47 kg/m.     09/09/2023    1:06 PM 07/30/2023   11:28 AM 11/07/2022    9:58 AM 09/04/2022    9:42 AM 08/16/2022   10:11 AM 10/29/2021    4:19 PM 10/29/2021    7:17 AM  Advanced Directives  Does Patient Have a Medical Advance Directive? No No No No No  No  Would patient like information on creating a medical advance directive? Yes (MAU/Ambulatory/Procedural Areas - Information given) No - Patient declined No - Patient declined No - Patient declined  No - Patient declined     Current Medications (verified) Outpatient Encounter Medications as of 09/09/2023  Medication Sig   [START ON 11/01/2023] ALPRAZolam  (XANAX ) 0.5 MG tablet Take 0.5-1 tablets (0.25-0.5 mg total) by mouth at bedtime as needed for anxiety.   amLODipine  (NORVASC ) 5 MG tablet Take 1 tablet (5 mg total) by mouth daily.   apixaban  (ELIQUIS ) 5 MG TABS tablet Take 1  tablet (5 mg total) by mouth 2 (two) times daily.   busPIRone  (BUSPAR ) 15 MG tablet Take 1 tablet (15 mg total) by mouth 3 (three) times daily. Take with 15mg  to equal 20mg  For anxiety   busPIRone  (BUSPAR ) 5 MG tablet Take 1 tablet (5 mg total) by mouth 3 (three) times daily. Take with the Buspirone  15mg  to equal 20mg  3 times daily.   Continuous Glucose Receiver (FREESTYLE LIBRE 3 READER) DEVI with compatible Freestyle Libre 3 sensor to monitor glucose continuously. DX: E11.65   famotidine  (PEPCID ) 20 MG tablet TAKE 1 TABLET BY MOUTH TWICE A DAY   furosemide  (LASIX ) 20 MG tablet Take 1 tablet (20 mg total) by mouth daily. Pt needs yearly appt for any # 90 day supply or future refills. Please call office to schedule appt.   glucose blood (ONETOUCH VERIO) test strip Use as instructed to monitor glucose 4 times daily (use as back up method for CGM)   insulin  degludec (TRESIBA  FLEXTOUCH) 200 UNIT/ML FlexTouch Pen Inject 50 Units into the skin in the morning. (Patient taking differently: Inject 45 Units into the skin in the morning.)   Insulin  Pen Needle (B-D ULTRAFINE III SHORT PEN) 31G X 8 MM MISC Use to give insulin  daily Dx E11.9   Lancets (ONETOUCH ULTRASOFT) lancets Use as instructed to monitor glucose 4 times daily (use as back up method for CGM).   lisinopril  (ZESTRIL ) 40 MG tablet Take 1 tablet (40 mg total) by mouth daily.   metFORMIN  (GLUCOPHAGE -XR) 500  MG 24 hr tablet TAKE 2 TABLETS BY MOUTH TWICE A DAY WITH FOOD   mirtazapine  (REMERON ) 45 MG tablet Take 1 tablet (45 mg total) by mouth at bedtime.   omeprazole (PRILOSEC) 20 MG capsule Take 20 mg by mouth daily.   rosuvastatin  (CRESTOR ) 20 MG tablet Take 1 tablet (20 mg) by mouth daily   Semaglutide, 1 MG/DOSE, (OZEMPIC, 1 MG/DOSE,) 2 MG/1.5ML SOPN Inject 1 mg into the skin once a week. MED VIA NOVO NORDISK PATIENT ASSISTANCE PROGRAM [place], [facility] assisting with this)   sildenafil  (VIAGRA ) 50 MG tablet Take 1 tablet (50 mg total) by mouth  daily as needed for erectile dysfunction.   No facility-administered encounter medications on file as of 09/09/2023.    Allergies (verified) Farxiga  [dapagliflozin ]   History: Past Medical History:  Diagnosis Date   Anxiety    Arthritis    Diabetes mellitus without complication (HCC)    GERD (gastroesophageal reflux disease)    Hypercholesteremia    Hypertension    Past Surgical History:  Procedure Laterality Date   boils     removed form back of skull   CATARACT EXTRACTION W/PHACO Right 04/02/2013   Procedure: CATARACT EXTRACTION PHACO AND INTRAOCULAR LENS PLACEMENT (IOC);  Surgeon: Dow JULIANNA Burke, MD;  Location: [facility];  Service: Ophthalmology;  Laterality: Right;  CDE 7.00   CATARACT EXTRACTION W/PHACO Left 10/04/2013   Procedure: CATARACT EXTRACTION PHACO AND INTRAOCULAR LENS PLACEMENT (IOC);  Surgeon: Dow JULIANNA Burke, MD;  Location: [facility];  Service: Ophthalmology;  Laterality: Left;  CDE:1.46   COLONOSCOPY N/A 07/03/2015   Procedure: COLONOSCOPY;  Surgeon: Claudis RAYMOND Rivet, MD;  Location: [facility];  Service: Endoscopy;  Laterality: N/A;  830   EYE SURGERY Right    valve replaced and stent placed per pt to reroute the fluid in his eye   ROTATOR CUFF REPAIR Right    Family History  Problem Relation Age of Onset   Diabetes Mother    Glaucoma Mother    Social History   Socioeconomic History   Marital status: Divorced    Spouse name: Not on file   Number of children: Not on file   Years of education: Not on file   Highest education level: Not on file  Occupational History   Not on file  Tobacco Use   Smoking status: Former    Current packs/day: 0.00    Average packs/day: 0.5 packs/day for 41.0 years (20.5 ttl pk-yrs)    Types: Cigarettes    Start date: 03/01/1971    Quit date: 02/29/2012    Years since quitting: 11.5   Smokeless tobacco: Never  Vaping Use   Vaping status: Never Used  Substance and Sexual Activity   Alcohol use: No    Comment: quit 2012    Drug use: No    Comment: quit in 2012   Sexual activity: Yes    Birth control/protection: None  Other Topics Concern   Not on file  Social History Narrative   Not on file   Social Drivers of Health   Financial Resource Strain: Low Risk  (09/09/2023)   Overall Financial Resource Strain (CARDIA)    Difficulty of Paying Living Expenses: Not hard at all  Food Insecurity: No Food Insecurity (09/09/2023)   Hunger Vital Sign    Worried About Running Out of Food in the Last Year: Never true    Ran Out of Food in the Last Year: Never true  Transportation Needs: No Transportation Needs (09/09/2023)  PRAPARE - Administrator, Civil Service (Medical): No    Lack of Transportation (Non-Medical): No  Physical Activity: Inactive (09/09/2023)   Exercise Vital Sign    Days of Exercise per Week: 0 days    Minutes of Exercise per Session: 0 min  Stress: No Stress Concern Present (09/09/2023)   [employer] of Occupational Health - Occupational Stress Questionnaire    Feeling of Stress : Not at all  Social Connections: Moderately Integrated (09/09/2023)   Social Connection and Isolation Panel [NHANES]    Frequency of Communication with Friends and Family: More than three times a week    Frequency of Social Gatherings with Friends and Family: Three times a week    Attends Religious Services: More than 4 times per year    Active Member of Clubs or Organizations: Yes    Attends Engineer, Structural: More than 4 times per year    Marital Status: Divorced    Tobacco Counseling Counseling given: Not Answered   Clinical Intake:  Pre-visit preparation completed: Yes  Pain : No/denies pain     Diabetes: Yes CBG done?: No Did pt. bring in CBG monitor from home?: No  How often do you need to have someone help you when you read instructions, pamphlets, or other written materials from your doctor or pharmacy?: 1 - Never  Interpreter Needed?: No  Information entered by ::  Charmaine Bloodgood LPN   Activities of Daily Living    09/09/2023   12:58 PM  In your present state of health, do you have any difficulty performing the following activities:  Hearing? 0  Vision? 0  Difficulty concentrating or making decisions? 0  Walking or climbing stairs? 0  Dressing or bathing? 0  Doing errands, shopping? 0  Preparing Food and eating ? N  Using the Toilet? N  In the past six months, have you accidently leaked urine? N  Do you have problems with loss of bowel control? N  Managing your Medications? N  Managing your Finances? N  Housekeeping or managing your Housekeeping? N    Patient Care Team: Jolinda Norene HERO, DO as PCP - General (Family Medicine) Lonni Slain, MD as PCP - Cardiology (Cardiology) Billee Mliss BIRCH, [facility] (Pharmacist) Ladora Ross Lacy Phebe, MD as Referring Physician (Optometry) Therisa Benton PARAS, NP as Nurse Practitioner (Nurse Practitioner)  Indicate any recent Medical Services you may have received from other than [facility] providers in the past year (date may be approximate).     Assessment:   This is a routine wellness examination for Brayant.  Hearing/Vision screen Hearing Screening - Comments:: Denies hearing difficulties   Vision Screening - Comments:: Wears rx glasses - up to date with routine eye exams with Dr. Ladora     Goals Addressed             This Visit's Progress    Prevent falls        Depression Screen    09/09/2023   12:59 PM 08/18/2023    2:38 PM 04/15/2023    2:06 PM 01/13/2023    2:13 PM 12/02/2022    1:18 PM 11/12/2022   10:25 AM 09/04/2022    9:36 AM  PHQ 2/9 Scores  PHQ - 2 Score 3 3 0 0 0 0 0  PHQ- 9 Score 6 6 0 0 0 0     Fall Risk    09/09/2023    1:07 PM 08/18/2023    1:50 PM 05/07/2023  2:52 PM 04/15/2023    2:06 PM 01/13/2023    2:13 PM  Fall Risk   Falls in the past year? 0 0 0 0 0  Number falls in past yr: 0 0 0 0 0  Injury with Fall? 0 0 0 0 0  Risk for fall due to : Impaired balance/gait No  Fall Risks No Fall Risks No Fall Risks No Fall Risks  Follow up Falls prevention discussed;Education provided;Falls evaluation completed Education provided Education provided Education provided     MEDICARE RISK AT HOME: Medicare Risk at Home Any stairs in or around the home?: No If so, are there any without handrails?: No Home free of loose throw rugs in walkways, pet beds, electrical cords, etc?: Yes Adequate lighting in your home to reduce risk of falls?: Yes Life alert?: No Use of a cane, walker or w/c?: No Grab bars in the bathroom?: Yes Shower chair or bench in shower?: No Elevated toilet seat or a handicapped toilet?: Yes  TIMED UP AND GO:  Was the test performed?  No    Cognitive Function:        09/09/2023    1:08 PM 09/04/2022    9:42 AM 09/03/2021    9:07 AM  6CIT Screen  What Year? 0 points 0 points 0 points  What month? 0 points 0 points 0 points  What time? 0 points 0 points 0 points  Count back from 20 0 points 0 points 0 points  Months in reverse 2 points 2 points 0 points  Repeat phrase 2 points 4 points 6 points  Total Score 4 points 6 points 6 points    Immunizations Immunization History  Administered Date(s) Administered   Fluad Quad(high Dose 65+) 04/30/2017, 04/13/2019, 05/15/2022   Fluad Trivalent(High Dose 65+) 04/15/2023   Influenza Split 04/19/2014, 05/29/2020   Influenza, Seasonal, Injecte, Preservative Fre 05/09/2015   Influenza-Unspecified 05/18/2014, 05/09/2015, 04/22/2018, 05/24/2020, 05/19/2021   Moderna SARS-COV2 Booster Vaccination 12/13/2020   Moderna Sars-Covid-2 Vaccination 09/30/2019, 10/29/2019, 06/24/2020, 12/13/2020   Pneumococcal Conjugate-13 04/23/2018   Pneumococcal Polysaccharide-23 12/28/2015   Zoster Recombinant(Shingrix ) 02/12/2022   Zoster, Live 01/04/2016    TDAP status: Due, Education has been provided regarding the importance of this vaccine. Advised may receive this vaccine at local pharmacy or Health Dept. Aware  to provide a copy of the vaccination record if obtained from local pharmacy or Health Dept. Verbalized acceptance and understanding.  Flu Vaccine status: Up to date  Pneumococcal vaccine status: Up to date  Covid-19 vaccine status: Information provided on how to obtain vaccines.   Qualifies for Shingles Vaccine? Yes   Zostavax completed Yes   Shingrix  Completed?: No.    Education has been provided regarding the importance of this vaccine. Patient has been advised to call insurance company to determine out of pocket expense if they have not yet received this vaccine. Advised may also receive vaccine at local pharmacy or Health Dept. Verbalized acceptance and understanding.  Screening Tests Health Maintenance  Topic Date Due   OPHTHALMOLOGY EXAM  04/19/2020   COVID-19 Vaccine (5 - 2024-25 season) 04/06/2023   Zoster Vaccines- Shingrix  (2 of 2) 11/16/2023 (Originally 04/09/2022)   Lung Cancer Screening  05/06/2024 (Originally 10/30/2022)   DTaP/Tdap/Td (1 - Tdap) 08/17/2024 (Originally 09/14/1969)   HEMOGLOBIN A1C  12/21/2023   FOOT EXAM  04/14/2024   Diabetic kidney evaluation - Urine ACR  05/06/2024   Diabetic kidney evaluation - eGFR measurement  08/03/2024   Medicare Annual Wellness (AWV)  09/08/2024  Colonoscopy  07/02/2025   Pneumonia Vaccine 57+ Years old  Completed   INFLUENZA VACCINE  Completed   Hepatitis C Screening  Completed   HPV VACCINES  Aged Out    Health Maintenance  Health Maintenance Due  Topic Date Due   OPHTHALMOLOGY EXAM  04/19/2020   COVID-19 Vaccine (5 - 2024-25 season) 04/06/2023    Colorectal cancer screening: Type of screening: Colonoscopy. Completed 07/03/15. Repeat every 10 years  Lung Cancer Screening: (Low Dose CT Chest recommended if Age 50-80 years, 20 pack-year currently smoking OR have quit w/in 15years.) does qualify.   Lung Cancer Screening Referral: last 2023; will consider repeat   Additional Screening:  Hepatitis C Screening: does  qualify; Completed 08/24/18  Vision Screening: Recommended annual ophthalmology exams for early detection of glaucoma and other disorders of the eye. Is the patient up to date with their annual eye exam?  Yes  Who is the provider or what is the name of the office in which the patient attends annual eye exams? Dr. Ladora  If pt is not established with a provider, would they like to be referred to a provider to establish care? No .   Dental Screening: Recommended annual dental exams for proper oral hygiene  Diabetic Foot Exam: Diabetic Foot Exam: Completed 04/15/23  Community Resource Referral / Chronic Care Management: CRR required this visit?  No   CCM required this visit?  No     Plan:     I have personally reviewed and noted the following in the patient's chart:   Medical and social history Use of alcohol, tobacco or illicit drugs  Current medications and supplements including opioid prescriptions. Patient is not currently taking opioid prescriptions. Functional ability and status Nutritional status Physical activity Advanced directives List of other physicians Hospitalizations, surgeries, and ER visits in previous 12 months Vitals Screenings to include cognitive, depression, and falls Referrals and appointments  In addition, I have reviewed and discussed with patient certain preventive protocols, quality metrics, and best practice recommendations. A written personalized care plan for preventive services as well as general preventive health recommendations were provided to patient.     Lavelle Pfeiffer [place], [place]   02/06/7973   After Visit Summary: (Mail) Due to this being a telephonic visit, the after visit summary with patients personalized plan was offered to patient via mail   Nurse Notes: See telephone note with patient concerns

## 2023-09-19 ENCOUNTER — Emergency Department (HOSPITAL_COMMUNITY): Payer: Medicare Other

## 2023-09-19 ENCOUNTER — Encounter (HOSPITAL_COMMUNITY): Payer: Self-pay

## 2023-09-19 ENCOUNTER — Emergency Department (HOSPITAL_COMMUNITY)
Admission: EM | Admit: 2023-09-19 | Discharge: 2023-09-19 | Disposition: A | Discharge status: Home / Self Care | Payer: Medicare Other | Attending: Emergency Medicine | Admitting: Emergency Medicine

## 2023-09-19 DIAGNOSIS — I6782 Cerebral ischemia: Secondary | ICD-10-CM | POA: Diagnosis not present

## 2023-09-19 DIAGNOSIS — G252 Other specified forms of tremor: Secondary | ICD-10-CM

## 2023-09-19 DIAGNOSIS — H538 Other visual disturbances: Secondary | ICD-10-CM | POA: Diagnosis not present

## 2023-09-19 DIAGNOSIS — Z743 Need for continuous supervision: Secondary | ICD-10-CM | POA: Diagnosis not present

## 2023-09-19 DIAGNOSIS — R42 Dizziness and giddiness: Secondary | ICD-10-CM

## 2023-09-19 DIAGNOSIS — Z0389 Encounter for observation for other suspected diseases and conditions ruled out: Secondary | ICD-10-CM | POA: Diagnosis not present

## 2023-09-19 DIAGNOSIS — R531 Weakness: Secondary | ICD-10-CM | POA: Diagnosis not present

## 2023-09-19 DIAGNOSIS — E860 Dehydration: Secondary | ICD-10-CM | POA: Diagnosis not present

## 2023-09-19 DIAGNOSIS — Z794 Long term (current) use of insulin: Secondary | ICD-10-CM | POA: Insufficient documentation

## 2023-09-19 DIAGNOSIS — R6889 Other general symptoms and signs: Secondary | ICD-10-CM | POA: Diagnosis not present

## 2023-09-19 DIAGNOSIS — I493 Ventricular premature depolarization: Secondary | ICD-10-CM

## 2023-09-19 DIAGNOSIS — E876 Hypokalemia: Secondary | ICD-10-CM | POA: Diagnosis not present

## 2023-09-19 DIAGNOSIS — Z7901 Long term (current) use of anticoagulants: Secondary | ICD-10-CM | POA: Insufficient documentation

## 2023-09-19 LAB — URINALYSIS, ROUTINE W REFLEX MICROSCOPIC
Bacteria, UA: NONE SEEN
Bilirubin Urine: NEGATIVE
Glucose, UA: NEGATIVE mg/dL
Hgb urine dipstick: NEGATIVE
Ketones, ur: NEGATIVE mg/dL
Nitrite: NEGATIVE
Protein, ur: 30 mg/dL — AB
Specific Gravity, Urine: 1.006 (ref 1.005–1.030)
pH: 6 (ref 5.0–8.0)

## 2023-09-19 LAB — CBC WITH DIFFERENTIAL/PLATELET
Abs Immature Granulocytes: 0.01 10*3/uL (ref 0.00–0.07)
Basophils Absolute: 0.1 10*3/uL (ref 0.0–0.1)
Basophils Relative: 1 %
Eosinophils Absolute: 0.1 10*3/uL (ref 0.0–0.5)
Eosinophils Relative: 2 %
HCT: 43.4 % (ref 39.0–52.0)
Hemoglobin: 13.6 g/dL (ref 13.0–17.0)
Immature Granulocytes: 0 %
Lymphocytes Relative: 33 %
Lymphs Abs: 2.3 10*3/uL (ref 0.7–4.0)
MCH: 27.1 pg (ref 26.0–34.0)
MCHC: 31.3 g/dL (ref 30.0–36.0)
MCV: 86.5 fL (ref 80.0–100.0)
Monocytes Absolute: 0.6 10*3/uL (ref 0.1–1.0)
Monocytes Relative: 8 %
Neutro Abs: 3.9 10*3/uL (ref 1.7–7.7)
Neutrophils Relative %: 56 %
Platelets: 244 10*3/uL (ref 150–400)
RBC: 5.02 MIL/uL (ref 4.22–5.81)
RDW: 15.7 % — ABNORMAL HIGH (ref 11.5–15.5)
WBC: 6.9 10*3/uL (ref 4.0–10.5)
nRBC: 0 % (ref 0.0–0.2)

## 2023-09-19 LAB — COMPREHENSIVE METABOLIC PANEL
ALT: 22 U/L (ref 0–44)
AST: 25 U/L (ref 15–41)
Albumin: 4 g/dL (ref 3.5–5.0)
Alkaline Phosphatase: 63 U/L (ref 38–126)
Anion gap: 11 (ref 5–15)
BUN: 18 mg/dL (ref 8–23)
CO2: 25 mmol/L (ref 22–32)
Calcium: 9.4 mg/dL (ref 8.9–10.3)
Chloride: 103 mmol/L (ref 98–111)
Creatinine, Ser: 0.91 mg/dL (ref 0.61–1.24)
GFR, Estimated: >60 mL/min (ref >60)
Glucose, Bld: 157 mg/dL — ABNORMAL HIGH (ref 70–99)
Potassium: 3.3 mmol/L — ABNORMAL LOW (ref 3.5–5.1)
Sodium: 139 mmol/L (ref 135–145)
Total Bilirubin: 0.9 mg/dL (ref 0.0–1.2)
Total Protein: 8.5 g/dL — ABNORMAL HIGH (ref 6.5–8.1)

## 2023-09-19 MED ORDER — POTASSIUM CHLORIDE CRYS ER 20 MEQ PO TBCR
40.0000 meq | EXTENDED_RELEASE_TABLET | Freq: Once | ORAL | Status: AC
  Administered 2023-09-19: 40 meq via ORAL
  Filled 2023-09-19: qty 2

## 2023-09-19 MED ORDER — MECLIZINE HCL 12.5 MG PO TABS: 12.5000 mg | ORAL_TABLET | Freq: Three times a day (TID) | ORAL | 0 refills | Status: DC | PRN

## 2023-09-19 MED ORDER — LACTATED RINGERS IV BOLUS
1000.0000 mL | Freq: Once | INTRAVENOUS | Status: AC
  Administered 2023-09-19: 1000 mL via INTRAVENOUS

## 2023-09-19 MED ORDER — MECLIZINE HCL 12.5 MG PO TABS
12.5000 mg | ORAL_TABLET | Freq: Once | ORAL | Status: AC
  Administered 2023-09-19: 12.5 mg via ORAL
  Filled 2023-09-19: qty 1

## 2023-09-19 NOTE — ED Notes (Signed)
Pt ambulated to nurse's station and back to room on room air @ 94% and pulse up to 113. Walked slowly, pt stated he felt weak and so we went back to room after getting to nurses station.

## 2023-09-19 NOTE — ED Notes (Signed)
Patient transported to CT

## 2023-09-19 NOTE — ED Triage Notes (Signed)
Pt is brought in by medic from home for some reported weakness, pt woke up and felt unsteady o his feet, he does use a walker. Something similar happened a moth ago and was Dx with dehydration/vertigo. He is compliant with all of his meds. Negative stroke screen. No unilateral weakness, no facial droop. He is alert and oriented.   Medic Vitals   180/84 161bgl 97%ra

## 2023-09-19 NOTE — ED Provider Notes (Signed)
Sent in from Dr. Clayborne Dana.  73 year old male here with weakness lightheadedness unsteady.  History of vertigo.  No neurosymptoms currently.  Getting some IV fluids meclizine and potassium supplementation.  Plan is to reassess and ambulate to see if appropriate for discharge. Physical Exam  BP (!) 157/90   Pulse 86   Temp 97.8 F (36.6 C)   Resp 18   Wt 135.4 kg   SpO2 96%   BMI 40.47 kg/m   Physical Exam  Procedures  Procedures  ED Course / MDM    Medical Decision Making Amount and/or Complexity of Data Reviewed Labs: ordered. Radiology: ordered. ECG/medicine tests: ordered.  Risk Prescription drug management.   9:20 AM.  Patient ambulated in department states he feels still little weak and dizzy but better.  He is comfortable plan for going home.  Will send a prescription to the pharmacy for some meclizine.  Recommended close follow-up with his PCP.  Return instructions discussed       Terrilee Files, MD 09/19/23 [phone]

## 2023-09-19 NOTE — ED Provider Notes (Signed)
  EMERGENCY DEPARTMENT AT [facility] Provider Note   CSN: [id number] Arrival date & time: 09/19/23  0557     History  Chief Complaint  Patient presents with   Weakness    [name] is a 73 y.o. male.  73 year old male who presents ER today with generalized weakness.  Patient states is similar to what happened last time except this time he did not fall.  States that way this morning he was lightheaded and off balance and fell like he needed to use the wall to walk.  States he felt just generally weak and wobbly all over.  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  He did drinking okay otherwise.  States last time he was told he had vertigo and dehydration.   Weakness      Home Medications Prior to Admission medications   Medication Sig Start Date End Date Taking? Authorizing Provider  ALPRAZolam Prudy Feeler) 0.5 MG tablet Take 0.5-1 tablets (0.25-0.5 mg total) by mouth at bedtime as needed for anxiety. 11/01/23   Raliegh Ip, DO  amLODipine (NORVASC) 5 MG tablet Take 1 tablet (5 mg total) by mouth daily. 08/18/23   Raliegh Ip, DO  apixaban (ELIQUIS) 5 MG TABS tablet Take 1 tablet (5 mg total) by mouth 2 (two) times daily. 01/01/23 12/27/23  Raliegh Ip, DO  busPIRone (BUSPAR) 15 MG tablet Take 1 tablet (15 mg total) by mouth 3 (three) times daily. Take with 15mg  to equal 20mg  For anxiety 08/18/23   Delynn Flavin M, DO  busPIRone (BUSPAR) 5 MG tablet Take 1 tablet (5 mg total) by mouth 3 (three) times daily. Take with the Buspirone 15mg  to equal 20mg  3 times daily. 08/18/23   Raliegh Ip, DO  Continuous Glucose Receiver (FREESTYLE LIBRE 3 READER) DEVI with compatible Freestyle Libre 3 sensor to monitor glucose continuously. DX: E11.65 05/30/23   Delynn Flavin M, DO  famotidine (PEPCID) 20 MG tablet TAKE 1 TABLET BY MOUTH TWICE A DAY 07/11/23   Delynn Flavin M, DO  furosemide (LASIX) 20 MG tablet  Take 1 tablet (20 mg total) by mouth daily. Pt needs yearly appt for any # 90 day supply or future refills. Please call office to schedule appt. 07/08/23   Jodelle Red, MD  glucose blood (ONETOUCH VERIO) test strip Use as instructed to monitor glucose 4 times daily (use as back up method for CGM) 04/10/21   Reardon, Freddi Starr, NP  insulin degludec (TRESIBA FLEXTOUCH) 200 UNIT/ML FlexTouch Pen Inject 50 Units into the skin in the morning. Patient taking differently: Inject 45 Units into the skin in the morning. 03/18/22   Dani Gobble, NP  Insulin Pen Needle (B-D ULTRAFINE III SHORT PEN) 31G X 8 MM MISC Use to give insulin daily Dx E11.9 06/09/19   Sonny Masters, FNP  Lancets [facility] ULTRASOFT) lancets Use as instructed to monitor glucose 4 times daily (use as back up method for CGM). 04/10/21   Dani Gobble, NP  lisinopril (ZESTRIL) 40 MG tablet Take 1 tablet (40 mg total) by mouth daily. 05/07/23   Delynn Flavin M, DO  metFORMIN (GLUCOPHAGE-XR) 500 MG 24 hr tablet TAKE 2 TABLETS BY MOUTH TWICE A DAY WITH FOOD 08/18/23   Delynn Flavin M, DO  mirtazapine (REMERON) 45 MG tablet Take 1 tablet (45 mg total) by mouth at bedtime. 05/07/23   Raliegh Ip, DO  omeprazole (PRILOSEC) 20 MG capsule Take 20 mg  by mouth daily.    [provider]  rosuvastatin (CRESTOR) 20 MG tablet Take 1 tablet (20 mg) by mouth daily 06/10/23   Delynn Flavin M, DO  Semaglutide, 1 MG/DOSE, (OZEMPIC, 1 MG/DOSE,) 2 MG/1.5ML SOPN Inject 1 mg into the skin once a week. MED VIA NOVO NORDISK PATIENT ASSISTANCE PROGRAM Raynelle Fanning [employer], [facility] assisting with this)    [provider]  sildenafil (VIAGRA) 50 MG tablet Take 1 tablet (50 mg total) by mouth daily as needed for erectile dysfunction. 08/16/20   Raliegh Ip, DO      Allergies    Farxiga [dapagliflozin]    Review of Systems   Review of Systems  Neurological:  Positive for weakness.    Physical Exam Updated Vital  Signs BP (!) 157/90   Pulse 86   Temp 97.8 F (36.6 C)   Resp 18   Wt 135.4 kg   SpO2 96%   BMI 40.47 kg/m  Physical Exam Vitals and nursing note reviewed.  Constitutional:      Appearance: He is well-developed.  HENT:     Head: Normocephalic and atraumatic.  Eyes:     Extraocular Movements: Extraocular movements intact.     Comments: Right eye is hazy pupil unreactive, baseline  Cardiovascular:     Rate and Rhythm: Normal rate.  Pulmonary:     Effort: Pulmonary effort is normal. No respiratory distress.  Abdominal:     General: There is no distension.  Musculoskeletal:        General: Normal range of motion.     Cervical back: Normal range of motion.  Neurological:     Mental Status: He is alert and oriented to person, place, and time. Mental status is at baseline.     Sensory: No sensory deficit.     Motor: No weakness.     ED Results / Procedures / Treatments   Labs (all labs ordered are listed, but only abnormal results are displayed) Labs Reviewed  CBC WITH DIFFERENTIAL/PLATELET - Abnormal; Notable for the following components:      Result Value   RDW 15.7 (*)    All other components within normal limits  COMPREHENSIVE METABOLIC PANEL  URINALYSIS, ROUTINE W REFLEX MICROSCOPIC    EKG None  Radiology No results found.  Procedures Procedures    Medications Ordered in ED Medications  lactated ringers bolus 1,000 mL (has no administration in time range)    ED Course/ Medical Decision Making/ A&P                                 Medical Decision Making Amount and/or Complexity of Data Reviewed Labs: ordered. Radiology: ordered. ECG/medicine tests: ordered.  Risk Prescription drug management.  Will evaluate for possible stroke/bleed.  Will give him some fluids against his dehydration, check basic labs.  Reevaluate after workup and fluids.  Symptoms improved. Still needs fluids/potassium repletion and ambulation for d/c. Care transferred to Dr.  Charm Barges pending same.    Final Clinical Impression(s) / ED Diagnoses Final diagnoses:  None    Rx / DC Orders ED Discharge Orders     None         Bronson Bressman, Barbara Cower, MD 09/20/23 0630

## 2023-09-29 ENCOUNTER — Other Ambulatory Visit (HOSPITAL_BASED_OUTPATIENT_CLINIC_OR_DEPARTMENT_OTHER): Payer: Self-pay | Admitting: Cardiology

## 2023-09-29 DIAGNOSIS — I5032 Chronic diastolic (congestive) heart failure: Secondary | ICD-10-CM

## 2023-09-30 ENCOUNTER — Telehealth: Payer: Self-pay | Admitting: Pharmacist

## 2023-09-30 NOTE — Telephone Encounter (Signed)
 VM box full PAP meds (tresiba and ozempic) are in lab fridge for pick up

## 2023-10-09 ENCOUNTER — Telehealth: Payer: Self-pay | Admitting: Family Medicine

## 2023-10-09 NOTE — Telephone Encounter (Signed)
 Patient called back and informed medications are ready for pick up. Patient states he will stop by office tomorrow to pick up.

## 2023-10-13 ENCOUNTER — Other Ambulatory Visit: Payer: Self-pay | Admitting: Family Medicine

## 2023-10-13 DIAGNOSIS — R12 Heartburn: Secondary | ICD-10-CM

## 2023-10-21 ENCOUNTER — Encounter: Payer: Self-pay | Admitting: Nurse Practitioner

## 2023-10-21 ENCOUNTER — Ambulatory Visit: Payer: Medicare Other | Admitting: Nurse Practitioner

## 2023-10-21 VITALS — BP 128/78 | HR 84 | Ht 72.0 in | Wt 270.0 lb

## 2023-10-21 DIAGNOSIS — I10 Essential (primary) hypertension: Secondary | ICD-10-CM | POA: Diagnosis not present

## 2023-10-21 DIAGNOSIS — Z7984 Long term (current) use of oral hypoglycemic drugs: Secondary | ICD-10-CM

## 2023-10-21 DIAGNOSIS — N182 Chronic kidney disease, stage 2 (mild): Secondary | ICD-10-CM

## 2023-10-21 DIAGNOSIS — E782 Mixed hyperlipidemia: Secondary | ICD-10-CM

## 2023-10-21 DIAGNOSIS — Z7985 Long-term (current) use of injectable non-insulin antidiabetic drugs: Secondary | ICD-10-CM | POA: Diagnosis not present

## 2023-10-21 DIAGNOSIS — E1122 Type 2 diabetes mellitus with diabetic chronic kidney disease: Secondary | ICD-10-CM | POA: Diagnosis not present

## 2023-10-21 DIAGNOSIS — Z794 Long term (current) use of insulin: Secondary | ICD-10-CM

## 2023-10-21 LAB — POCT GLYCOSYLATED HEMOGLOBIN (HGB A1C): Hemoglobin A1C: 7.5 % — AB (ref 4.0–5.6)

## 2023-10-21 MED ORDER — TRESIBA FLEXTOUCH 200 UNIT/ML ~~LOC~~ SOPN: 35.0000 [IU] | PEN_INJECTOR | Freq: Every morning | SUBCUTANEOUS | Status: DC

## 2023-10-21 NOTE — Progress Notes (Signed)
 Endocrinology Follow Up Note       10/21/2023, 1:59 PM   Subjective:    Patient ID: [name], male    DOB: 03/29/1951.  [name] is being seen in follow up after being seen in consultation for management of currently uncontrolled symptomatic diabetes requested by  Raliegh Ip, DO.   Past Medical History:  Diagnosis Date   Anxiety    Arthritis    Diabetes mellitus without complication (HCC)    GERD (gastroesophageal reflux disease)    Hypercholesteremia    Hypertension     Past Surgical History:  Procedure Laterality Date   boils     removed form back of skull   CATARACT EXTRACTION W/PHACO Right 04/02/2013   Procedure: CATARACT EXTRACTION PHACO AND INTRAOCULAR LENS PLACEMENT (IOC);  Surgeon: Susa Simmonds, MD;  Location: [facility];  Service: Ophthalmology;  Laterality: Right;  CDE 7.00   CATARACT EXTRACTION W/PHACO Left 10/04/2013   Procedure: CATARACT EXTRACTION PHACO AND INTRAOCULAR LENS PLACEMENT (IOC);  Surgeon: Susa Simmonds, MD;  Location: [facility];  Service: Ophthalmology;  Laterality: Left;  CDE:1.46   COLONOSCOPY N/A 07/03/2015   Procedure: COLONOSCOPY;  Surgeon: Malissa Hippo, MD;  Location: [facility];  Service: Endoscopy;  Laterality: N/A;  830   EYE SURGERY Right    "valve replaced and stent placed" per pt to reroute the fluid in his eye   ROTATOR CUFF REPAIR Right     Social History   Socioeconomic History   Marital status: Divorced    Spouse name: Not on file   Number of children: Not on file   Years of education: Not on file   Highest education level: Not on file  Occupational History   Not on file  Tobacco Use   Smoking status: Former    Current packs/day: 0.00    Average packs/day: 0.5 packs/day for 41.0 years (20.5 ttl pk-yrs)    Types: Cigarettes    Start date: 03/01/1971    Quit date: 02/29/2012    Years since quitting: 11.6   Smokeless tobacco: Never   Vaping Use   Vaping status: Never Used  Substance and Sexual Activity   Alcohol use: No    Comment: quit 2012   Drug use: No    Comment: quit in 2012   Sexual activity: Yes    Birth control/protection: None  Other Topics Concern   Not on file  Social History Narrative   Not on file   Social Drivers of Health   Financial Resource Strain: Low Risk  (09/09/2023)   Overall Financial Resource Strain (CARDIA)    Difficulty of Paying Living Expenses: Not hard at all  Food Insecurity: No Food Insecurity (09/09/2023)   Hunger Vital Sign    Worried About Running Out of Food in the Last Year: Never true    Ran Out of Food in the Last Year: Never true  Transportation Needs: No Transportation Needs (09/09/2023)   PRAPARE - Administrator, Civil Service (Medical): No    Lack of Transportation (Non-Medical): No  Physical Activity: Inactive (09/09/2023)   Exercise Vital Sign    Days of Exercise per Week:  0 days    Minutes of Exercise per Session: 0 min  Stress: No Stress Concern Present (09/09/2023)   [employer] of Occupational Health - Occupational Stress Questionnaire    Feeling of Stress : Not at all  Social Connections: Moderately Integrated (09/09/2023)   Social Connection and Isolation Panel [NHANES]    Frequency of Communication with Friends and Family: More than three times a week    Frequency of Social Gatherings with Friends and Family: Three times a week    Attends Religious Services: More than 4 times per year    Active Member of Clubs or Organizations: Yes    Attends Banker Meetings: More than 4 times per year    Marital Status: Divorced    Family History  Problem Relation Age of Onset   Diabetes Mother    Glaucoma Mother     Outpatient Encounter Medications as of 10/21/2023  Medication Sig   amLODipine (NORVASC) 5 MG tablet Take 1 tablet (5 mg total) by mouth daily.   apixaban (ELIQUIS) 5 MG TABS tablet Take 1 tablet (5 mg total) by mouth 2  (two) times daily.   busPIRone (BUSPAR) 15 MG tablet Take 1 tablet (15 mg total) by mouth 3 (three) times daily. Take with 15mg  to equal 20mg  For anxiety   busPIRone (BUSPAR) 5 MG tablet Take 1 tablet (5 mg total) by mouth 3 (three) times daily. Take with the Buspirone 15mg  to equal 20mg  3 times daily.   Continuous Glucose Receiver (FREESTYLE LIBRE 3 READER) DEVI with compatible Freestyle Libre 3 sensor to monitor glucose continuously. DX: E11.65   famotidine (PEPCID) 20 MG tablet TAKE 1 TABLET BY MOUTH TWICE A DAY   furosemide (LASIX) 20 MG tablet TAKE 1 TABLET BY MOUTH EVERY DAY   glucose blood (ONETOUCH VERIO) test strip Use as instructed to monitor glucose 4 times daily (use as back up method for CGM)   Insulin Pen Needle (B-D ULTRAFINE III SHORT PEN) 31G X 8 MM MISC Use to give insulin daily Dx E11.9   Lancets (ONETOUCH ULTRASOFT) lancets Use as instructed to monitor glucose 4 times daily (use as back up method for CGM).   lisinopril (ZESTRIL) 40 MG tablet Take 1 tablet (40 mg total) by mouth daily.   metFORMIN (GLUCOPHAGE-XR) 500 MG 24 hr tablet TAKE 2 TABLETS BY MOUTH TWICE A DAY WITH FOOD   mirtazapine (REMERON) 45 MG tablet Take 1 tablet (45 mg total) by mouth at bedtime.   omeprazole (PRILOSEC) 20 MG capsule Take 20 mg by mouth daily.   rosuvastatin (CRESTOR) 20 MG tablet Take 1 tablet (20 mg) by mouth daily   Semaglutide, 1 MG/DOSE, (OZEMPIC, 1 MG/DOSE,) 2 MG/1.5ML SOPN Inject 1 mg into the skin once a week. MED VIA NOVO NORDISK PATIENT ASSISTANCE PROGRAM Raynelle Fanning [place], [facility] assisting with this)   sildenafil (VIAGRA) 50 MG tablet Take 1 tablet (50 mg total) by mouth daily as needed for erectile dysfunction.   [DISCONTINUED] insulin degludec (TRESIBA FLEXTOUCH) 200 UNIT/ML FlexTouch Pen Inject 50 Units into the skin in the morning. (Patient taking differently: Inject 45 Units into the skin in the morning.)   [START ON 11/01/2023] ALPRAZolam (XANAX) 0.5 MG tablet Take 0.5-1 tablets (0.25-0.5  mg total) by mouth at bedtime as needed for anxiety. (Patient not taking: Reported on 10/21/2023)   insulin degludec (TRESIBA FLEXTOUCH) 200 UNIT/ML FlexTouch Pen Inject 36 Units into the skin in the morning.   meclizine (ANTIVERT) 12.5 MG tablet Take 1 tablet (12.5  mg total) by mouth 3 (three) times daily as needed for dizziness. (Patient not taking: Reported on 10/21/2023)   No facility-administered encounter medications on file as of 10/21/2023.    ALLERGIES: Allergies  Allergen Reactions   Farxiga [Dapagliflozin] Other (See Comments)    Dizziness, passed out/orthostasis    VACCINATION STATUS: Immunization History  Administered Date(s) Administered   Fluad Quad(high Dose 65+) 04/30/2017, 04/13/2019, 05/15/2022   Fluad Trivalent(High Dose 65+) 04/15/2023   Influenza Split 04/19/2014, 05/29/2020   Influenza, Seasonal, Injecte, Preservative Fre 05/09/2015   Influenza-Unspecified 05/18/2014, 05/09/2015, 04/22/2018, 05/24/2020, 05/19/2021   Moderna SARS-COV2 Booster Vaccination 12/13/2020   Moderna Sars-Covid-2 Vaccination 09/30/2019, 10/29/2019, 06/24/2020, 12/13/2020   Pneumococcal Conjugate-13 04/23/2018   Pneumococcal Polysaccharide-23 12/28/2015   Zoster Recombinant(Shingrix) 02/12/2022   Zoster, Live 01/04/2016    Diabetes He presents for his follow-up diabetic visit. He has type 2 diabetes mellitus. Onset time: diagnosed at approx age of 73. His disease course has been improving. There are no hypoglycemic associated symptoms. Associated symptoms include foot paresthesias and weight loss. Pertinent negatives for diabetes include no blurred vision, no fatigue, no polydipsia, no polyuria, no visual change and no weakness. There are no hypoglycemic complications. Symptoms are improving. Diabetic complications include heart disease, impotence, nephropathy and retinopathy. Risk factors for coronary artery disease include diabetes mellitus, dyslipidemia, family history, hypertension, male  sex, obesity, sedentary lifestyle and tobacco exposure. Current diabetic treatment includes insulin injections and oral agent (monotherapy) (and Ozempic). He is compliant with treatment most of the time. His weight is fluctuating minimally. He is following a generally healthy diet. When asked about meal planning, he reported none. He has not had a previous visit with a dietitian. He rarely participates in exercise. His home blood glucose trend is decreasing steadily. His overall blood glucose range is 110-130 mg/dl. (He presents today with his CGM showing tightening glycemic profile overall.  His POCT A1c today was 7.5%, improving from last visit of 8.6%.  Analysis of his CGM shows TIR 95%, TAR 5%, TBR 0%.  He denies any singificant hypoglycemia hypoglycemia.  He notes he is getting Ozempic from patient assistance through his PCP.  He has lost some weight.  He notes he has been hospitalized for tremors/dizziness.) An ACE inhibitor/angiotensin II receptor blocker is being taken. He does not see a podiatrist.Eye exam is current.  Hyperlipidemia This is a chronic problem. The current episode started more than 1 year ago. The problem is controlled. Recent lipid tests were reviewed and are normal. Exacerbating diseases include chronic renal disease, diabetes and obesity. Factors aggravating his hyperlipidemia include fatty foods. Current antihyperlipidemic treatment includes statins. The current treatment provides moderate improvement of lipids. Compliance problems include adherence to diet and adherence to exercise.  Risk factors for coronary artery disease include diabetes mellitus, dyslipidemia, family history, male sex, hypertension, obesity and a sedentary lifestyle.  Hypertension This is a chronic problem. The current episode started more than 1 year ago. The problem has been resolved since onset. The problem is controlled. Pertinent negatives include no blurred vision. There are no associated agents to  hypertension. Risk factors for coronary artery disease include diabetes mellitus, dyslipidemia, family history, male gender, obesity, sedentary lifestyle and smoking/tobacco exposure. Past treatments include ACE inhibitors and calcium channel blockers. The current treatment provides moderate improvement. There are no compliance problems.  Hypertensive end-organ damage includes kidney disease, CAD/MI and retinopathy. Identifiable causes of hypertension include chronic renal disease.    Review of systems  Constitutional: + decreasing body weight,  current Body mass index is 36.62 kg/m., no fatigue, no subjective hyperthermia, no subjective hypothermia Eyes: + blurry vision (bilateral retinopathy), legally blind in right eye, no xerophthalmia ENT: no sore throat, no nodules palpated in throat, no dysphagia/odynophagia, no hoarseness Cardiovascular: no chest pain, no shortness of breath, no palpitations, no leg swelling Respiratory: no cough, no shortness of breath Gastrointestinal: no nausea/vomiting/diarrhea Musculoskeletal: no muscle/joint aches, walks with cane for deconditioning Skin: no rashes, no hyperemia Neurological: + intermittent tremors, no numbness, no tingling, + intermittent dizziness Psychiatric: no depression, no anxiety   Objective:     BP 128/78 (BP Location: Left Arm, Patient Position: Sitting, Cuff Size: Large)   Pulse 84   Ht 6' (1.829 m)   Wt 270 lb (122.5 kg)   BMI 36.62 kg/m   Wt Readings from Last 3 Encounters:  10/21/23 270 lb (122.5 kg)  09/19/23 298 lb 6.4 oz (135.4 kg)  09/09/23 291 lb (132 kg)     BP Readings from Last 3 Encounters:  10/21/23 128/78  09/19/23 (!) 168/96  08/18/23 119/66     Physical Exam- Limited  Constitutional:  Body mass index is 36.62 kg/m. , not in acute distress, normal state of mind Eyes:  EOMI, no exophthalmos, right eye opaque conjunctiva Neck: Supple Musculoskeletal: no gross deformities, strength intact in all four  extremities, no gross restriction of joint movements, walks with cane for deconditioning Skin:  no rashes, no hyperemia Neurological: no tremor with outstretched hands   Diabetic Foot Exam - Simple   No data filed    CMP ( most recent) CMP     Component Value Date/Time   NA 139 09/19/2023 0614   NA 145 (H) 08/04/2023 1524   K 3.3 (L) 09/19/2023 0614   CL 103 09/19/2023 0614   CO2 25 09/19/2023 0614   GLUCOSE 157 (H) 09/19/2023 0614   BUN 18 09/19/2023 0614   BUN 19 08/04/2023 1524   CREATININE 0.91 09/19/2023 0614   CALCIUM 9.4 09/19/2023 0614   PROT 8.5 (H) 09/19/2023 0614   PROT 7.8 08/04/2023 1524   ALBUMIN 4.0 09/19/2023 0614   ALBUMIN 4.3 08/04/2023 1524   AST 25 09/19/2023 0614   ALT 22 09/19/2023 0614   ALKPHOS 63 09/19/2023 0614   BILITOT 0.9 09/19/2023 0614   BILITOT 0.3 08/04/2023 1524   GFRNONAA >60 09/19/2023 0614   GFRAA 92 04/13/2020 0906     Diabetic Labs (most recent): Lab Results  Component Value Date   HGBA1C 7.5 (A) 10/21/2023   HGBA1C 8.6 (A) 06/23/2023   HGBA1C 8.2 (A) 02/18/2023   MICROALBUR 150 12/05/2020     Lipid Panel ( most recent) Lipid Panel     Component Value Date/Time   CHOL 103 06/11/2021 0958   TRIG 98 06/11/2021 0958   HDL 27 (L) 06/11/2021 0958   CHOLHDL 3.8 06/11/2021 0958   LDLCALC 57 06/11/2021 0958   LABVLDL 19 06/11/2021 0958      Lab Results  Component Value Date   TSH 0.781 08/16/2022   TSH 0.663 10/29/2021   TSH 0.280 (L) 07/05/2019   TSH 0.124 (L) 04/28/2019   TSH 0.059 (L) 04/06/2019           Assessment & Plan:   1) Uncontrolled Type 2 Diabetes with long-term use of insulin  - [name] has currently uncontrolled symptomatic type 2 DM since 73 years of age.   He presents today with his CGM showing tightening glycemic profile overall.  His POCT A1c  today was 7.5%, improving from last visit of 8.6%.  Analysis of his CGM shows TIR 95%, TAR 5%, TBR 0%.  He denies any singificant hypoglycemia  hypoglycemia.  He notes he is getting Ozempic from patient assistance through his PCP.  He has lost some weight.  He notes he has been hospitalized for tremors/dizziness.  -Recent labs reviewed.  - I had a long discussion with him about the progressive nature of diabetes and the pathology behind its complications. -his diabetes is complicated by CKD, CAD and he remains at a high risk for more acute and chronic complications which include CAD, CVA, CKD, retinopathy, and neuropathy. These are all discussed in detail with him.  - Nutritional counseling repeated at each appointment due to patients tendency to fall back in to old habits.  - The patient admits there is a room for improvement in their diet and drink choices. -  Suggestion is made for the patient to avoid simple carbohydrates from their diet including Cakes, Sweet Desserts / Pastries, Ice Cream, Soda (diet and regular), Sweet Tea, Candies, Chips, Cookies, Sweet Pastries, Store Bought Juices, Alcohol in Excess of 1-2 drinks a day, Artificial Sweeteners, Coffee Creamer, and "Sugar-free" Products. This will help patient to have stable blood glucose profile and potentially avoid unintended weight gain.   - I encouraged the patient to switch to unprocessed or minimally processed complex starch and increased protein intake (animal or plant source), fruits, and vegetables.   - Patient is advised to stick to a routine mealtimes to eat 3 meals a day and avoid unnecessary snacks (to snack only to correct hypoglycemia).  - I have approached him with the following individualized plan to manage  his diabetes and patient agrees:   -Given his improved, at goal glycemic profile overall will lower his Evaristo Bury to 35 units SQ nightly and continue the higher dose of Ozempic 1 mg SW weekly (from PAP set up by his PCP) and continue Metformin 500 mg ER twice daily with meals.   -he is encouraged to continue using his CGM to monitor glucose 4 times daily,  before meals and before bed, and to call the clinic if he has readings less than 70 or greater than 300 for 3 tests in a row.  He notes he was sent a libre 3 sensor but did not have the meter to go with it.  He notes his PCP, pharmacist is helping to get him a new reader to work with the [place] 3 sensors.  He could certainly benefit from the upgrade given the alarm features they have.  - he is warned not to take insulin without proper monitoring per orders. - Adjustment parameters are given to him for hypo and hyperglycemia in writing.  - Specific targets for  A1c;  LDL, HDL,  and Triglycerides were discussed with the patient.  2) Blood Pressure /Hypertension:  his blood pressure is controlled to target.   he is advised to continue his current medications as prescribed by his PCP.  3) Lipids/Hyperlipidemia:    Review of his recent lipid panel from 06/11/21 showed controlled LDL at 57 .  he  is advised to continue Crestor 20 mg daily at bedtime.  Side effects and precautions discussed with him.   4)  Weight/Diet:  his Body mass index is 36.62 kg/m.  -  clearly complicating his diabetes care.   he is a candidate for weight loss. I discussed with him the fact that loss of 5 - 10% of his  current body weight will have the most impact on his diabetes management.  Exercise, and detailed carbohydrates information provided  -  detailed on discharge instructions.  5) Chronic Care/Health Maintenance: -he is on ACEI/ARB and Statin medications and is encouraged to initiate and continue to follow up with Ophthalmology, Dentist, Podiatrist at least yearly or according to recommendations, and advised to stay away from smoking. I have recommended yearly flu vaccine and pneumonia vaccine at least every 5 years; moderate intensity exercise for up to 150 minutes weekly; and sleep for at least 7 hours a day.  - he is advised to maintain close follow up with Raliegh Ip, DO for primary care needs, as well as  his other providers for optimal and coordinated care.     I spent  30  minutes in the care of the patient today including review of labs from CMP, Lipids, Thyroid Function, Hematology (current and previous including abstractions from other facilities); face-to-face time discussing  his blood glucose readings/logs, discussing hypoglycemia and hyperglycemia episodes and symptoms, medications doses, his options of short and long term treatment based on the latest standards of care / guidelines;  discussion about incorporating lifestyle medicine;  and documenting the encounter. Risk reduction counseling performed per USPSTF guidelines to reduce obesity and cardiovascular risk factors.     Please refer to Patient Instructions for Blood Glucose Monitoring and Insulin/Medications Dosing Guide"  in media tab for additional information. Please  also refer to " Patient Self Inventory" in the Media  tab for reviewed elements of pertinent patient history.  [name] participated in the discussions, expressed understanding, and voiced agreement with the above plans.  All questions were answered to his satisfaction. he is encouraged to contact clinic should he have any questions or concerns prior to his return visit.   Follow up plan: - Return in about 4 months (around 02/20/2024) for Diabetes F/U with A1c in office, No previsit labs, Bring meter and logs.  Ronny Bacon, [facility] [address] [place], [place] [address] Phone: [phone] Fax: [phone]  10/21/2023, 1:59 PM

## 2023-10-22 ENCOUNTER — Other Ambulatory Visit (HOSPITAL_BASED_OUTPATIENT_CLINIC_OR_DEPARTMENT_OTHER): Payer: Self-pay | Admitting: Cardiology

## 2023-10-22 DIAGNOSIS — I5032 Chronic diastolic (congestive) heart failure: Secondary | ICD-10-CM

## 2023-10-27 ENCOUNTER — Emergency Department (HOSPITAL_COMMUNITY)
Admission: EM | Admit: 2023-10-27 | Discharge: 2023-10-27 | Disposition: A | Discharge status: Home / Self Care | Attending: Emergency Medicine | Admitting: Emergency Medicine

## 2023-10-27 ENCOUNTER — Other Ambulatory Visit: Payer: Self-pay

## 2023-10-27 ENCOUNTER — Encounter (HOSPITAL_COMMUNITY): Payer: Self-pay

## 2023-10-27 ENCOUNTER — Emergency Department (HOSPITAL_COMMUNITY)

## 2023-10-27 DIAGNOSIS — Z794 Long term (current) use of insulin: Secondary | ICD-10-CM | POA: Diagnosis not present

## 2023-10-27 DIAGNOSIS — Z7984 Long term (current) use of oral hypoglycemic drugs: Secondary | ICD-10-CM | POA: Diagnosis not present

## 2023-10-27 DIAGNOSIS — I110 Hypertensive heart disease with heart failure: Secondary | ICD-10-CM | POA: Diagnosis not present

## 2023-10-27 DIAGNOSIS — Z79899 Other long term (current) drug therapy: Secondary | ICD-10-CM | POA: Insufficient documentation

## 2023-10-27 DIAGNOSIS — E119 Type 2 diabetes mellitus without complications: Secondary | ICD-10-CM | POA: Diagnosis not present

## 2023-10-27 DIAGNOSIS — R42 Dizziness and giddiness: Secondary | ICD-10-CM

## 2023-10-27 DIAGNOSIS — N3000 Acute cystitis without hematuria: Secondary | ICD-10-CM | POA: Diagnosis not present

## 2023-10-27 DIAGNOSIS — K5730 Diverticulosis of large intestine without perforation or abscess without bleeding: Secondary | ICD-10-CM | POA: Diagnosis not present

## 2023-10-27 DIAGNOSIS — R531 Weakness: Secondary | ICD-10-CM

## 2023-10-27 DIAGNOSIS — I509 Heart failure, unspecified: Secondary | ICD-10-CM | POA: Insufficient documentation

## 2023-10-27 DIAGNOSIS — R109 Unspecified abdominal pain: Secondary | ICD-10-CM | POA: Diagnosis not present

## 2023-10-27 LAB — RESP PANEL BY RT-PCR (RSV, FLU A&B, COVID)  RVPGX2
Influenza A by PCR: NEGATIVE
Influenza B by PCR: NEGATIVE
Resp Syncytial Virus by PCR: NEGATIVE
SARS Coronavirus 2 by RT PCR: NEGATIVE

## 2023-10-27 LAB — URINALYSIS, ROUTINE W REFLEX MICROSCOPIC
Bilirubin Urine: NEGATIVE
Glucose, UA: NEGATIVE mg/dL
Hgb urine dipstick: NEGATIVE
Ketones, ur: NEGATIVE mg/dL
Nitrite: NEGATIVE
Protein, ur: 100 mg/dL — AB
Specific Gravity, Urine: 1.011 (ref 1.005–1.030)
pH: 5 (ref 5.0–8.0)

## 2023-10-27 LAB — COMPREHENSIVE METABOLIC PANEL
ALT: 22 U/L (ref 0–44)
AST: 24 U/L (ref 15–41)
Albumin: 3.8 g/dL (ref 3.5–5.0)
Alkaline Phosphatase: 50 U/L (ref 38–126)
Anion gap: 12 (ref 5–15)
BUN: 12 mg/dL (ref 8–23)
CO2: 24 mmol/L (ref 22–32)
Calcium: 9.4 mg/dL (ref 8.9–10.3)
Chloride: 103 mmol/L (ref 98–111)
Creatinine, Ser: 1.01 mg/dL (ref 0.61–1.24)
GFR, Estimated: >60 mL/min (ref >60)
Glucose, Bld: 113 mg/dL — ABNORMAL HIGH (ref 70–99)
Potassium: 4.7 mmol/L (ref 3.5–5.1)
Sodium: 139 mmol/L (ref 135–145)
Total Bilirubin: 0.6 mg/dL (ref 0.0–1.2)
Total Protein: 8 g/dL (ref 6.5–8.1)

## 2023-10-27 LAB — CBC
HCT: 45.3 % (ref 39.0–52.0)
Hemoglobin: 14.3 g/dL (ref 13.0–17.0)
MCH: 26.7 pg (ref 26.0–34.0)
MCHC: 31.6 g/dL (ref 30.0–36.0)
MCV: 84.5 fL (ref 80.0–100.0)
Platelets: 220 10*3/uL (ref 150–400)
RBC: 5.36 MIL/uL (ref 4.22–5.81)
RDW: 16.5 % — ABNORMAL HIGH (ref 11.5–15.5)
WBC: 5.8 10*3/uL (ref 4.0–10.5)
nRBC: 0 % (ref 0.0–0.2)

## 2023-10-27 LAB — LIPASE, BLOOD: Lipase: 34 U/L (ref 11–51)

## 2023-10-27 MED ORDER — CEPHALEXIN 500 MG PO CAPS
500.0000 mg | ORAL_CAPSULE | Freq: Once | ORAL | Status: AC
  Administered 2023-10-27: 500 mg via ORAL
  Filled 2023-10-27: qty 1

## 2023-10-27 MED ORDER — MECLIZINE HCL 25 MG PO TABS: 25.0000 mg | ORAL_TABLET | Freq: Three times a day (TID) | ORAL | 0 refills | Status: AC | PRN

## 2023-10-27 MED ORDER — CEPHALEXIN 500 MG PO CAPS: 500.0000 mg | ORAL_CAPSULE | Freq: Four times a day (QID) | ORAL | 0 refills | Status: AC

## 2023-10-27 MED ORDER — IOHEXOL 300 MG/ML  SOLN
100.0000 mL | Freq: Once | INTRAMUSCULAR | Status: AC | PRN
Start: 2023-10-27 — End: 2023-10-27
  Administered 2023-10-27: 100 mL via INTRAVENOUS

## 2023-10-27 MED ORDER — MECLIZINE HCL 12.5 MG PO TABS
25.0000 mg | ORAL_TABLET | Freq: Once | ORAL | Status: AC
  Administered 2023-10-27: 25 mg via ORAL
  Filled 2023-10-27: qty 2

## 2023-10-27 MED ORDER — SODIUM CHLORIDE 0.9 % IV BOLUS
500.0000 mL | Freq: Once | INTRAVENOUS | Status: AC
  Administered 2023-10-27: 500 mL via INTRAVENOUS

## 2023-10-27 NOTE — ED Notes (Signed)
This RN unsuccessful at IV

## 2023-10-27 NOTE — ED Notes (Signed)
 ED Provider at bedside.

## 2023-10-27 NOTE — ED Notes (Signed)
 Pt reports the weakness began shortly after his doctor took him off of his Xanax. Pt now reports difficulty with walking.

## 2023-10-27 NOTE — Discharge Instructions (Addendum)
 As discussed, your urine shows signs of a urinary tract infection.  Take Keflex 4 times a day for next 7 days.  Please complete full course of antibiotics to ensure resolution of infection.  The urine is being sent for culture.  It takes about 2 to 3 days to get the results.  You will receive a call from the hospital if you need a change in antibiotics otherwise continue taking the medication as prescribed.  Take meclizine up to 3 times a day as needed for dizziness.  I have attached information for how to perform the Epley maneuver at home to try to help resolve the symptoms.  Your imaging did not show signs of hernia or abdominal process that is causing the distention of the right side of the abdomen.  Follow-up with primary care provider in the next 5 to 7 days for reevaluation of your symptoms.  Get help right away if: You have very bad pain in your back or lower belly. You faint.

## 2023-10-27 NOTE — ED Provider Notes (Signed)
 [place] EMERGENCY DEPARTMENT AT [facility] Provider Note   CSN: [id number] Arrival date & time: 10/27/23  1306     History  Chief Complaint  Patient presents with   Weakness    [name] is a 73 y.o. male with a history of CHF, hypertension, and diabetes mellitus who presents the ED today for multiple concerns.  Patient endorses dizziness for the past month.  That is worse with position changes.  He was seen for this about a month ago and denies any improvement of symptoms.  States that he recently stopped Xanax at the time and his primary care told him that he could be going through withdrawals because of it.  Additionally he reports generalized weakness has been going on for about a year.  He has been seen for the same in the past.  Denies any new or worsening dizziness or weakness.  No vision changes, headaches, slurred speech, confusion, or focal neurologic deficits.  Subsequently, patient reports an enlarging mass at the right lower quadrant of the abdomen.  Patient's daughter is at bedside and states that he has had this prior a while but it seems to be getting larger.  Patient denies any pain to the abdomen, nausea, vomiting, changes to urinary or bowel habits.  Denies any additional complaints or concerns at this time.    Home Medications Prior to Admission medications   Medication Sig Start Date End Date Taking? Authorizing Provider  cephALEXin (KEFLEX) 500 MG capsule Take 1 capsule (500 mg total) by mouth 4 (four) times daily for 7 days. 10/27/23 11/03/23 Yes Maxwell Marion, PA-C  meclizine (ANTIVERT) 25 MG tablet Take 1 tablet (25 mg total) by mouth 3 (three) times daily as needed for up to 14 days for dizziness. 10/27/23 11/10/23 Yes Maxwell Marion, PA-C  ALPRAZolam Prudy Feeler) 0.5 MG tablet Take 0.5-1 tablets (0.25-0.5 mg total) by mouth at bedtime as needed for anxiety. Patient not taking: Reported on 10/21/2023 11/01/23   Raliegh Ip, DO  amLODipine (NORVASC) 5 MG  tablet Take 1 tablet (5 mg total) by mouth daily. 08/18/23   Raliegh Ip, DO  apixaban (ELIQUIS) 5 MG TABS tablet Take 1 tablet (5 mg total) by mouth 2 (two) times daily. 01/01/23 12/27/23  Raliegh Ip, DO  busPIRone (BUSPAR) 15 MG tablet Take 1 tablet (15 mg total) by mouth 3 (three) times daily. Take with 15mg  to equal 20mg  For anxiety 08/18/23   Delynn Flavin M, DO  busPIRone (BUSPAR) 5 MG tablet Take 1 tablet (5 mg total) by mouth 3 (three) times daily. Take with the Buspirone 15mg  to equal 20mg  3 times daily. 08/18/23   Raliegh Ip, DO  Continuous Glucose Receiver (FREESTYLE LIBRE 3 READER) DEVI with compatible Freestyle Libre 3 sensor to monitor glucose continuously. DX: E11.65 05/30/23   Delynn Flavin M, DO  famotidine (PEPCID) 20 MG tablet TAKE 1 TABLET BY MOUTH TWICE A DAY 10/13/23   Delynn Flavin M, DO  furosemide (LASIX) 20 MG tablet TAKE 1 TABLET BY MOUTH EVERY DAY 10/22/23   Jodelle Red, MD  glucose blood (ONETOUCH VERIO) test strip Use as instructed to monitor glucose 4 times daily (use as back up method for CGM) 04/10/21   Reardon, Freddi Starr, NP  insulin degludec (TRESIBA FLEXTOUCH) 200 UNIT/ML FlexTouch Pen Inject 36 Units into the skin in the morning. 10/21/23   Dani Gobble, NP  Insulin Pen Needle (B-D ULTRAFINE III SHORT PEN) 31G X 8 MM MISC Use  to give insulin daily Dx E11.9 06/09/19   Sonny Masters, FNP  Lancets [facility] ULTRASOFT) lancets Use as instructed to monitor glucose 4 times daily (use as back up method for CGM). 04/10/21   Dani Gobble, NP  lisinopril (ZESTRIL) 40 MG tablet Take 1 tablet (40 mg total) by mouth daily. 05/07/23   Delynn Flavin M, DO  metFORMIN (GLUCOPHAGE-XR) 500 MG 24 hr tablet TAKE 2 TABLETS BY MOUTH TWICE A DAY WITH FOOD 08/18/23   Delynn Flavin M, DO  mirtazapine (REMERON) 45 MG tablet Take 1 tablet (45 mg total) by mouth at bedtime. 05/07/23   Raliegh Ip, DO  omeprazole (PRILOSEC) 20 MG  capsule Take 20 mg by mouth daily.    [provider]  rosuvastatin (CRESTOR) 20 MG tablet Take 1 tablet (20 mg) by mouth daily 06/10/23   Delynn Flavin M, DO  Semaglutide, 1 MG/DOSE, (OZEMPIC, 1 MG/DOSE,) 2 MG/1.5ML SOPN Inject 1 mg into the skin once a week. MED VIA NOVO NORDISK PATIENT ASSISTANCE PROGRAM Raynelle Fanning [place], [facility] assisting with this)    [provider]  sildenafil (VIAGRA) 50 MG tablet Take 1 tablet (50 mg total) by mouth daily as needed for erectile dysfunction. 08/16/20   Raliegh Ip, DO      Allergies    Farxiga [dapagliflozin]    Review of Systems   Review of Systems  Neurological:  Positive for weakness.  All other systems reviewed and are negative.   Physical Exam Updated Vital Signs BP (!) 140/96   Pulse 61   Temp 97.6 F (36.4 C) (Oral)   Resp 17   Ht 6' (1.829 m)   Wt 122.5 kg   SpO2 99%   BMI 36.62 kg/m  Physical Exam Vitals and nursing note reviewed.  Constitutional:      General: He is not in acute distress.    Appearance: Normal appearance.  HENT:     Head: Normocephalic and atraumatic.     Mouth/Throat:     Mouth: Mucous membranes are moist.  Eyes:     Extraocular Movements: Extraocular movements intact.     Conjunctiva/sclera: Conjunctivae normal.     Pupils: Pupils are equal, round, and reactive to light.     Comments: No nystagmus  Cardiovascular:     Rate and Rhythm: Normal rate and regular rhythm.     Pulses: Normal pulses.     Heart sounds: Normal heart sounds.  Pulmonary:     Effort: Pulmonary effort is normal.     Breath sounds: Normal breath sounds.  Abdominal:     Palpations: Abdomen is soft.     Tenderness: There is no abdominal tenderness.  Musculoskeletal:        General: Normal range of motion.     Cervical back: Normal range of motion.     Right lower leg: No edema.     Left lower leg: No edema.  Skin:    General: Skin is warm and dry.     Findings: No rash.  Neurological:     General: No  focal deficit present.     Mental Status: He is alert.     Sensory: No sensory deficit.     Motor: No weakness.  Psychiatric:        Mood and Affect: Mood normal.        Behavior: Behavior normal.    ED Results / Procedures / Treatments   Labs (all labs ordered are listed, but only abnormal results are  displayed) Labs Reviewed  COMPREHENSIVE METABOLIC PANEL - Abnormal; Notable for the following components:      Result Value   Glucose, Bld 113 (*)    All other components within normal limits  CBC - Abnormal; Notable for the following components:   RDW 16.5 (*)    All other components within normal limits  URINALYSIS, ROUTINE W REFLEX MICROSCOPIC - Abnormal; Notable for the following components:   Protein, ur 100 (*)    Leukocytes,Ua SMALL (*)    Bacteria, UA RARE (*)    All other components within normal limits  RESP PANEL BY RT-PCR (RSV, FLU A&B, COVID)  [id number]  URINE CULTURE  LIPASE, BLOOD    EKG None  Radiology CT ABDOMEN PELVIS W CONTRAST Result Date: 10/27/2023 CLINICAL DATA:  Acute abdominal pain EXAM: CT ABDOMEN AND PELVIS WITH CONTRAST TECHNIQUE: Multidetector CT imaging of the abdomen and pelvis was performed using the standard protocol following bolus administration of intravenous contrast. RADIATION DOSE REDUCTION: This exam was performed according to the departmental dose-optimization program which includes automated exposure control, adjustment of the mA and/or kV according to patient size and/or use of iterative reconstruction technique. CONTRAST:  OMNIPAQUE IOHEXOL 300 MG/ML  SOLN COMPARISON:  CT abdomen and pelvis 08/11/2007 FINDINGS: Lower chest: No acute abnormality. Hepatobiliary: No focal liver abnormality is seen. No gallstones, gallbladder wall thickening, or biliary dilatation. Pancreas: Unremarkable. No pancreatic ductal dilatation or surrounding inflammatory changes. Spleen: Normal in size without focal abnormality. Adrenals/Urinary Tract: Adrenal  glands are unremarkable. Kidneys are normal, without renal calculi, focal lesion, or hydronephrosis. Bladder is unremarkable. Stomach/Bowel: Stomach is within normal limits. Appendix appears normal. No evidence of bowel wall thickening, distention, or inflammatory changes. There sigmoid colon diverticulosis. Vascular/Lymphatic: Aortic atherosclerosis. No enlarged abdominal or pelvic lymph nodes. Reproductive: Prostate is unremarkable. Other: No abdominal wall hernia or abnormality. No abdominopelvic ascites. Musculoskeletal: Degenerative changes affect the spine the hips. Subcentimeter bone island in the left iliac wing is unchanged from 2009. IMPRESSION: 1. No acute localizing process in the abdomen or pelvis. 2. Sigmoid colon diverticulosis. 3. Aortic atherosclerosis. Aortic Atherosclerosis (ICD10-I70.0). Electronically Signed   By: Darliss Cheney M.D.   On: 10/27/2023 19:40    Procedures Procedures: not indicated.   Medications Ordered in ED Medications  meclizine (ANTIVERT) tablet 25 mg (25 mg Oral Given 10/27/23 1648)  sodium chloride 0.9 % bolus 500 mL (0 mLs Intravenous Stopped 10/27/23 1957)  iohexol (OMNIPAQUE) 300 MG/ML solution 100 mL (100 mLs Intravenous Contrast Given 10/27/23 1704)  cephALEXin (KEFLEX) capsule 500 mg (500 mg Oral Given 10/27/23 1957)    ED Course/ Medical Decision Making/ A&P                                 Medical Decision Making Amount and/or Complexity of Data Reviewed Labs: ordered. Radiology: ordered.  Risk Prescription drug management.   This patient presents to the ED for concern of generalized weakness, this involves an extensive number of treatment options, and is a complaint that carries with it a high risk of complications and morbidity.   Differential diagnosis includes: Flu, COVID, RSV, other viral illness, UTI, electro abnormality, dehydration, AKI, hypoglycemia, DKA, HHS, etc.   Comorbidities  See HPI above   Additional  History  Additional history obtained from prior ED notes   Cardiac Monitoring / EKG  The patient was maintained on a cardiac monitor.  I personally viewed and interpreted the  cardiac monitored which showed: atrial fibrillation with PVC, heart rate of 70 bpm.   Lab Tests  I ordered and personally interpreted labs.  The pertinent results include:   UA shows small leukocytes and rare bacteria.  Urine culture pending.   CMP, lipase, CBC are reassuring Negative respiratory panel   Imaging Studies  I ordered imaging studies including CT abdomen/pelvis  I independently visualized and interpreted imaging which showed:  No acute localizing process in the abdomen or pelvis.  Sigmoid colon diverticulosis. I agree with the radiologist interpretation   Problem List / ED Course / Critical Interventions / Medication Management  Patient endorses intermittent vertigo for the past month.  He was seen in the ED when it first occurred and had normal head imaging.  He was sent home with meclizine which helped some.  He ran out of medication and has still been having some positional dizziness.  Additionally, he endorses generalized weakness for the past year that has worsened over the past several days. Additionally, daughter at bedside is concerned that patient has increased swelling to the right side of his abdomen.  She is concerned about hernia.  Patient denies any abdominal pain, changes to urinary or bowel habits.  No nausea or vomiting. I ordered medications including: Meclizine and NS for vertigo  Reevaluation of the patient after these medicines showed that the patient resolved. Discussed findings with patient and daughter at bedside.  First dose antibiotic given today for UTI prior to discharge. Scription's for Keflex and Meclizine sent to the pharmacy.  Patient given instructions for Epley maneuver to try at home.   Social Determinants of Health  Physical activity   Test / Admission -  Considered  Patient is hemodynamically stable and safe for discharge home. Return precautions given.       Final Clinical Impression(s) / ED Diagnoses Final diagnoses:  Acute cystitis without hematuria  Generalized weakness  Vertigo    Rx / DC Orders ED Discharge Orders          Ordered    cephALEXin (KEFLEX) 500 MG capsule  4 times daily        10/27/23 1948    meclizine (ANTIVERT) 25 MG tablet  3 times daily PRN        10/27/23 1948              Maxwell Marion, PA-C 10/27/23 1959    Gloris Manchester, MD 10/27/23 2037

## 2023-10-27 NOTE — ED Triage Notes (Signed)
 Pt arrived via POV c/o on-going weakness X 2 week, and possible hernia. Pt presents with large abdominal protrusion. Pt reports last BM was yesterday. Pt endorses nausea w/o emesis.

## 2023-10-27 NOTE — ED Notes (Signed)
 Pt to CT

## 2023-10-28 ENCOUNTER — Telehealth: Payer: Self-pay | Admitting: *Deleted

## 2023-10-28 LAB — URINE CULTURE: Culture: NO GROWTH

## 2023-10-28 NOTE — Telephone Encounter (Signed)
 Transition Care Management Unsuccessful Follow-up Telephone Call  Date of discharge and from where:  [facility]  Attempts:  1st Attempt  Reason for unsuccessful TCM follow-up call:  Voice mail full

## 2023-11-04 ENCOUNTER — Other Ambulatory Visit (HOSPITAL_BASED_OUTPATIENT_CLINIC_OR_DEPARTMENT_OTHER): Payer: Self-pay | Admitting: Cardiology

## 2023-11-04 DIAGNOSIS — I5032 Chronic diastolic (congestive) heart failure: Secondary | ICD-10-CM

## 2023-11-06 ENCOUNTER — Ambulatory Visit (INDEPENDENT_AMBULATORY_CARE_PROVIDER_SITE_OTHER): Admitting: Family Medicine

## 2023-11-06 ENCOUNTER — Encounter: Payer: Self-pay | Admitting: Family Medicine

## 2023-11-06 VITALS — BP 138/76 | HR 88 | Ht 72.0 in | Wt 266.0 lb

## 2023-11-06 DIAGNOSIS — R42 Dizziness and giddiness: Secondary | ICD-10-CM

## 2023-11-06 DIAGNOSIS — R5383 Other fatigue: Secondary | ICD-10-CM | POA: Diagnosis not present

## 2023-11-06 DIAGNOSIS — N3000 Acute cystitis without hematuria: Secondary | ICD-10-CM

## 2023-11-06 DIAGNOSIS — F411 Generalized anxiety disorder: Secondary | ICD-10-CM | POA: Diagnosis not present

## 2023-11-06 DIAGNOSIS — F1320 Sedative, hypnotic or anxiolytic dependence, uncomplicated: Secondary | ICD-10-CM

## 2023-11-06 DIAGNOSIS — R531 Weakness: Secondary | ICD-10-CM

## 2023-11-06 MED ORDER — CITALOPRAM HYDROBROMIDE 20 MG PO TABS: 20.0000 mg | ORAL_TABLET | Freq: Every day | ORAL | 5 refills | Status: DC

## 2023-11-06 NOTE — Progress Notes (Signed)
 BP 138/76   Pulse 88   Ht 6' (1.829 m)   Wt 266 lb (120.7 kg)   SpO2 98%   BMI 36.08 kg/m    Subjective:   Patient ID: [name], male    DOB: 1950/08/13, 73 y.o.   MRN: [id number]  HPI: [name] is a 73 y.o. male presenting on 11/06/2023 for Hospitalization Follow-up (Weakness, acute cystitis)   HPI Hospital/ER follow-up Patient was in the emergency department on 10/27/2023 for weakness.  It would like he was treated for a UTI and vertigo and given cephalexin and meclizine.  He says all of these types of symptoms started after he stopped his benzodiazepines a couple months ago and he does has not been feeling well since then.  He feels more fatigued and has a spinning sensation.  He does feel like he has anxiety as well.  He says he is just not felt right over the past couple months.  He has tried some meclizine and is currently being treated for cystitis with Keflex.  He said the meclizine did not help.  He says the urinary symptoms are completely improved.  Relevant past medical, surgical, family and social history reviewed and updated as indicated. Interim medical history since our last visit reviewed. Allergies and medications reviewed and updated.  Review of Systems  Constitutional:  Positive for fatigue. Negative for chills and fever.  Eyes:  Negative for visual disturbance.  Respiratory:  Negative for shortness of breath and wheezing.   Cardiovascular:  Negative for chest pain and leg swelling.  Gastrointestinal:  Negative for abdominal pain, constipation and diarrhea.  Genitourinary:  Negative for dysuria and frequency.  Skin:  Negative for rash.  Neurological:  Positive for dizziness and weakness. Negative for light-headedness.  All other systems reviewed and are negative.   Per HPI unless specifically indicated above   Allergies as of 11/06/2023       Reactions   Farxiga [dapagliflozin] Other (See Comments)   Dizziness, passed out/orthostasis         Medication List        Accurate as of November 06, 2023  2:55 PM. If you have any questions, ask your nurse or doctor.          STOP taking these medications    ALPRAZolam 0.5 MG tablet Commonly known as: Xanax Stopped by: Elige Radon Adamarys Shall       TAKE these medications    amLODipine 5 MG tablet Commonly known as: NORVASC Take 1 tablet (5 mg total) by mouth daily.   apixaban 5 MG Tabs tablet Commonly known as: ELIQUIS Take 1 tablet (5 mg total) by mouth 2 (two) times daily.   B-D ULTRAFINE III SHORT PEN 31G X 8 MM Misc Generic drug: Insulin Pen Needle Use to give insulin daily Dx E11.9   busPIRone 15 MG tablet Commonly known as: BUSPAR Take 1 tablet (15 mg total) by mouth 3 (three) times daily. Take with 15mg  to equal 20mg  For anxiety   busPIRone 5 MG tablet Commonly known as: BUSPAR Take 1 tablet (5 mg total) by mouth 3 (three) times daily. Take with the Buspirone 15mg  to equal 20mg  3 times daily.   citalopram 20 MG tablet Commonly known as: CeleXA Take 1 tablet (20 mg total) by mouth daily. Started by: Elige Radon Pia Jedlicka   famotidine 20 MG tablet Commonly known as: PEPCID TAKE 1 TABLET BY MOUTH TWICE A DAY   FreeStyle Libre 3 Reader [place] with compatible  Freestyle Libre 3 sensor to monitor glucose continuously. DX: E11.65   furosemide 20 MG tablet Commonly known as: LASIX TAKE 1 TABLET BY MOUTH EVERY DAY   lisinopril 40 MG tablet Commonly known as: ZESTRIL Take 1 tablet (40 mg total) by mouth daily.   meclizine 25 MG tablet Commonly known as: ANTIVERT Take 1 tablet (25 mg total) by mouth 3 (three) times daily as needed for up to 14 days for dizziness.   metFORMIN 500 MG 24 hr tablet Commonly known as: GLUCOPHAGE-XR TAKE 2 TABLETS BY MOUTH TWICE A DAY WITH FOOD   mirtazapine 45 MG tablet Commonly known as: REMERON Take 1 tablet (45 mg total) by mouth at bedtime.   omeprazole 20 MG capsule Commonly known as: PRILOSEC Take 20 mg by mouth daily.    onetouch ultrasoft lancets Use as instructed to monitor glucose 4 times daily (use as back up method for CGM).   OneTouch Verio test strip Generic drug: glucose blood Use as instructed to monitor glucose 4 times daily (use as back up method for CGM)   Ozempic (1 MG/DOSE) 2 MG/1.5ML Sopn Generic drug: Semaglutide (1 MG/DOSE) Inject 1 mg into the skin once a week. MED VIA NOVO NORDISK PATIENT ASSISTANCE PROGRAM Raynelle Fanning [place], [facility] assisting with this)   rosuvastatin 20 MG tablet Commonly known as: CRESTOR Take 1 tablet (20 mg) by mouth daily   sildenafil 50 MG tablet Commonly known as: Viagra Take 1 tablet (50 mg total) by mouth daily as needed for erectile dysfunction.   Evaristo Bury FlexTouch 200 UNIT/ML FlexTouch Pen Generic drug: insulin degludec Inject 36 Units into the skin in the morning.         Objective:   BP 138/76   Pulse 88   Ht 6' (1.829 m)   Wt 266 lb (120.7 kg)   SpO2 98%   BMI 36.08 kg/m   Wt Readings from Last 3 Encounters:  11/06/23 266 lb (120.7 kg)  10/27/23 270 lb (122.5 kg)  10/21/23 270 lb (122.5 kg)    Physical Exam Vitals and nursing note reviewed.  Constitutional:      General: He is not in acute distress.    Appearance: He is well-developed. He is not diaphoretic.  Eyes:     General: No scleral icterus.    Conjunctiva/sclera: Conjunctivae normal.  Neck:     Thyroid: No thyromegaly.  Cardiovascular:     Rate and Rhythm: Normal rate and regular rhythm.     Heart sounds: Normal heart sounds. No murmur heard. Pulmonary:     Effort: Pulmonary effort is normal. No respiratory distress.     Breath sounds: Normal breath sounds. No wheezing.  Abdominal:     General: Abdomen is flat. Bowel sounds are normal. There is no distension.     Tenderness: There is no abdominal tenderness. There is no guarding or rebound.  Musculoskeletal:        General: No swelling. Normal range of motion.     Cervical back: Neck supple.  Lymphadenopathy:      Cervical: No cervical adenopathy.  Skin:    General: Skin is warm and dry.     Findings: No rash.  Neurological:     Mental Status: He is alert and oriented to person, place, and time.     Coordination: Coordination normal.  Psychiatric:        Behavior: Behavior normal.       Assessment & Plan:   Problem List Items Addressed This Visit  Other   GAD (generalized anxiety disorder) - Primary   Relevant Medications   citalopram (CELEXA) 20 MG tablet   Other Relevant Orders   CBC with Differential/Platelet   CMP14+EGFR   TSH   Other Visit Diagnoses       Weakness       Relevant Medications   citalopram (CELEXA) 20 MG tablet   Other Relevant Orders   CBC with Differential/Platelet   CMP14+EGFR   TSH     Benzodiazepine dependence (HCC)       Relevant Medications   citalopram (CELEXA) 20 MG tablet   Other Relevant Orders   CBC with Differential/Platelet   CMP14+EGFR   TSH     Other fatigue       Relevant Medications   citalopram (CELEXA) 20 MG tablet   Other Relevant Orders   CBC with Differential/Platelet   CMP14+EGFR   TSH     Vertigo       Relevant Orders   Ambulatory referral to Neurology   CBC with Differential/Platelet   CMP14+EGFR   TSH     Acute cystitis without hematuria       Relevant Orders   CBC with Differential/Platelet   CMP14+EGFR   TSH     Will start citalopram, has close follow-up with PCP.  Also place neurology referral because of persistent dizziness and vertigo.  He is more than 2 months out from being off benzodiazepines so I do not know fully that it is related to that.  Could be related to anxiety both symptoms, will start the citalopram for that.  Follow up plan: Return if symptoms worsen or fail to improve.  Already has follow-up with PCP soon  Counseling provided for all of the vaccine components Orders Placed This Encounter  Procedures   CBC with Differential/Platelet   CMP14+EGFR   TSH   Ambulatory referral to  Neurology    Arville Care, MD [facility] 11/06/2023, 2:55 PM

## 2023-11-07 LAB — CBC WITH DIFFERENTIAL/PLATELET
Basophils Absolute: 0 10*3/uL (ref 0.0–0.2)
Basos: 0 %
EOS (ABSOLUTE): 0.1 10*3/uL (ref 0.0–0.4)
Eos: 2 %
Hematocrit: 46.4 % (ref 37.5–51.0)
Hemoglobin: 14.8 g/dL (ref 13.0–17.7)
Immature Grans (Abs): 0 10*3/uL (ref 0.0–0.1)
Immature Granulocytes: 0 %
Lymphocytes Absolute: 3.1 10*3/uL (ref 0.7–3.1)
Lymphs: 40 %
MCH: 26.5 pg — ABNORMAL LOW (ref 26.6–33.0)
MCHC: 31.9 g/dL (ref 31.5–35.7)
MCV: 83 fL (ref 79–97)
Monocytes Absolute: 0.5 10*3/uL (ref 0.1–0.9)
Monocytes: 6 %
Neutrophils Absolute: 4 10*3/uL (ref 1.4–7.0)
Neutrophils: 52 %
Platelets: 278 10*3/uL (ref 150–450)
RBC: 5.58 x10E6/uL (ref 4.14–5.80)
RDW: 16.1 % — ABNORMAL HIGH (ref 11.6–15.4)
WBC: 7.7 10*3/uL (ref 3.4–10.8)

## 2023-11-07 LAB — CMP14+EGFR
ALT: 23 IU/L (ref 0–44)
AST: 28 IU/L (ref 0–40)
Albumin: 4.3 g/dL (ref 3.8–4.8)
Alkaline Phosphatase: 73 IU/L (ref 44–121)
BUN/Creatinine Ratio: 11 (ref 10–24)
BUN: 13 mg/dL (ref 8–27)
Bilirubin Total: 0.4 mg/dL (ref 0.0–1.2)
CO2: 21 mmol/L (ref 20–29)
Calcium: 9.8 mg/dL (ref 8.6–10.2)
Chloride: 101 mmol/L (ref 96–106)
Creatinine, Ser: 1.15 mg/dL (ref 0.76–1.27)
Globulin, Total: 3.2 g/dL (ref 1.5–4.5)
Glucose: 118 mg/dL — ABNORMAL HIGH (ref 70–99)
Potassium: 4.9 mmol/L (ref 3.5–5.2)
Sodium: 141 mmol/L (ref 134–144)
Total Protein: 7.5 g/dL (ref 6.0–8.5)
eGFR: 67 mL/min/{1.73_m2} (ref >59)

## 2023-11-07 LAB — TSH: TSH: 0.745 u[IU]/mL (ref 0.450–4.500)

## 2023-11-12 ENCOUNTER — Telehealth: Payer: Self-pay | Admitting: Pharmacist

## 2023-11-12 ENCOUNTER — Telehealth: Payer: Self-pay

## 2023-11-12 NOTE — Telephone Encounter (Signed)
 [facility] via [facility] for libre 3 plus refills Patient uses/had reader device

## 2023-11-12 NOTE — Telephone Encounter (Signed)
 Looks like Raynelle Fanning was getting these from [place] for him.  It looks like it just fell off of his list back in October.  I suspect that she will probably want to reorder them for him.  I will CC her to see if she can assist.  She may be deferring this back to his endocrinologist as he does see 1 outside of our clinic.

## 2023-11-12 NOTE — Telephone Encounter (Signed)
 Copied from CRM [id number]. Topic: Clinical - Prescription Issue >> Nov 12, 2023 10:58 AM Ivette P wrote: Reason for CRM: Pt daughter Laverle Hobby called in to request a prior authorization with a prescription order be sent to insurance. Fax number provided below.   Prescription needed: Continuous Glucose Receiver (FREESTYLE LIBRE 3 READER) DEVI  [facility]   Fax number [phone]  If any further questions please call Daughter  Laverle Hobby [phone]

## 2023-11-14 NOTE — Telephone Encounter (Signed)
Noted  -LS

## 2023-11-17 ENCOUNTER — Encounter: Payer: Self-pay | Admitting: Family Medicine

## 2023-11-17 ENCOUNTER — Ambulatory Visit (INDEPENDENT_AMBULATORY_CARE_PROVIDER_SITE_OTHER): Payer: Medicare Other | Admitting: Family Medicine

## 2023-11-17 VITALS — BP 126/78 | HR 102 | Temp 98.2°F | Ht 72.0 in | Wt 259.0 lb

## 2023-11-17 DIAGNOSIS — F1320 Sedative, hypnotic or anxiolytic dependence, uncomplicated: Secondary | ICD-10-CM | POA: Diagnosis not present

## 2023-11-17 DIAGNOSIS — I700 Atherosclerosis of aorta: Secondary | ICD-10-CM | POA: Diagnosis not present

## 2023-11-17 DIAGNOSIS — F411 Generalized anxiety disorder: Secondary | ICD-10-CM

## 2023-11-17 DIAGNOSIS — Z79899 Other long term (current) drug therapy: Secondary | ICD-10-CM | POA: Diagnosis not present

## 2023-11-17 DIAGNOSIS — R195 Other fecal abnormalities: Secondary | ICD-10-CM

## 2023-11-17 MED ORDER — ALPRAZOLAM 0.5 MG PO TABS
0.2500 mg | ORAL_TABLET | Freq: Every day | ORAL | 1 refills | Status: DC | PRN
Start: 2023-11-17 — End: 2024-03-19

## 2023-11-17 MED ORDER — CULTURELLE DIGESTIVE DAILY PO CAPS: ORAL_CAPSULE | ORAL | 3 refills | Status: DC

## 2023-11-17 NOTE — Progress Notes (Signed)
 Subjective: CC:GAD PCP: Raliegh Ip, DO OZH:YQMVH C [name] is a 73 y.o. male presenting to clinic today for:  1. GAD Patient was seen on November 06, 2023 for breakthrough generalized anxiety disorder in the setting of weakness" that he was evaluated for in the ER on 10/27/2023.  He was treated for UTI and vertigo and given Keflex and meclizine.  However, after further discussion the provider that saw him was told that most of his symptoms started after he "stopped his benzodiazepines a couple of months ago and he has not been feeling well since".  He reports having felt more fatigued and felt like his anxiety was increasing as well.  He was subsequently placed on Celexa 20 mg daily and advised to follow-up with neurology for vertigo.   He notes that he tried to get a refill of his alprazolam but was told that there were no refills by his pharmacy.  He did not try reaching out to [place] nor try seeing if they send [place] a refill request at any point.  He was not aware that a 90-day supply with 1 additional refill was sent back in January.  He does report some improvement with the Celexa and his mirtazapine but has not really noticed that the BuSpar is helping much so he would like to discontinue this medicine and resume use of as needed alprazolam if possible.  He continues to get situational anxiety, specifically if he is in crowded places and would like to have it on hand for those periods  He reports loose stools that are yellow in color.  Denies any change in diet, medications etc.  These have been ongoing for over a month now.  Had a CT of his abdomen and pelvis but this did not show anything except for aortic atherosclerosis and some diverticulosis without diverticulitis   ROS: Per HPI  Allergies  Allergen Reactions   Farxiga [Dapagliflozin] Other (See Comments)    Dizziness, passed out/orthostasis   Past Medical History:  Diagnosis Date   Anxiety    Arthritis    Diabetes mellitus without  complication (HCC)    GERD (gastroesophageal reflux disease)    Hypercholesteremia    Hypertension     Current Outpatient Medications:    amLODipine (NORVASC) 5 MG tablet, Take 1 tablet (5 mg total) by mouth daily., Disp: 90 tablet, Rfl: 3   apixaban (ELIQUIS) 5 MG TABS tablet, Take 1 tablet (5 mg total) by mouth 2 (two) times daily., Disp: 180 tablet, Rfl: 3   busPIRone (BUSPAR) 15 MG tablet, Take 1 tablet (15 mg total) by mouth 3 (three) times daily. Take with 15mg  to equal 20mg  For anxiety, Disp: 270 tablet, Rfl: 1   busPIRone (BUSPAR) 5 MG tablet, Take 1 tablet (5 mg total) by mouth 3 (three) times daily. Take with the Buspirone 15mg  to equal 20mg  3 times daily., Disp: 270 tablet, Rfl: 3   citalopram (CELEXA) 20 MG tablet, Take 1 tablet (20 mg total) by mouth daily., Disp: 30 tablet, Rfl: 5   Continuous Glucose Receiver (FREESTYLE LIBRE 3 READER) DEVI, with compatible Freestyle Libre 3 sensor to monitor glucose continuously. DX: E11.65, Disp: 1 each, Rfl: 1   famotidine (PEPCID) 20 MG tablet, TAKE 1 TABLET BY MOUTH TWICE A DAY, Disp: 180 tablet, Rfl: 0   furosemide (LASIX) 20 MG tablet, TAKE 1 TABLET BY MOUTH EVERY DAY, Disp: 15 tablet, Rfl: 0   glucose blood (ONETOUCH VERIO) test strip, Use as instructed to monitor glucose  4 times daily (use as back up method for CGM), Disp: 100 each, Rfl: 12   insulin degludec (TRESIBA FLEXTOUCH) 200 UNIT/ML FlexTouch Pen, Inject 36 Units into the skin in the morning., Disp: , Rfl:    Insulin Pen Needle (B-D ULTRAFINE III SHORT PEN) 31G X 8 MM MISC, Use to give insulin daily Dx E11.9, Disp: 100 each, Rfl: 3   Lancets (ONETOUCH ULTRASOFT) lancets, Use as instructed to monitor glucose 4 times daily (use as back up method for CGM)., Disp: 100 each, Rfl: 12   lisinopril (ZESTRIL) 40 MG tablet, Take 1 tablet (40 mg total) by mouth daily., Disp: 90 tablet, Rfl: 3   metFORMIN (GLUCOPHAGE-XR) 500 MG 24 hr tablet, TAKE 2 TABLETS BY MOUTH TWICE A DAY WITH FOOD, Disp:  360 tablet, Rfl: 0   mirtazapine (REMERON) 45 MG tablet, Take 1 tablet (45 mg total) by mouth at bedtime., Disp: 90 tablet, Rfl: 3   omeprazole (PRILOSEC) 20 MG capsule, Take 20 mg by mouth daily., Disp: , Rfl:    rosuvastatin (CRESTOR) 20 MG tablet, Take 1 tablet (20 mg) by mouth daily, Disp: 90 tablet, Rfl: 3   Semaglutide, 1 MG/DOSE, (OZEMPIC, 1 MG/DOSE,) 2 MG/1.5ML SOPN, Inject 1 mg into the skin once a week. MED VIA NOVO NORDISK PATIENT ASSISTANCE PROGRAM Raynelle Fanning [place], [facility] assisting with this), Disp: , Rfl:    sildenafil (VIAGRA) 50 MG tablet, Take 1 tablet (50 mg total) by mouth daily as needed for erectile dysfunction., Disp: 10 tablet, Rfl: 2 Social History   Socioeconomic History   Marital status: Divorced    Spouse name: Not on file   Number of children: Not on file   Years of education: Not on file   Highest education level: Not on file  Occupational History   Not on file  Tobacco Use   Smoking status: Former    Current packs/day: 0.00    Average packs/day: 0.5 packs/day for 41.0 years (20.5 ttl pk-yrs)    Types: Cigarettes    Start date: 03/01/1971    Quit date: 02/29/2012    Years since quitting: 11.7   Smokeless tobacco: Never  Vaping Use   Vaping status: Never Used  Substance and Sexual Activity   Alcohol use: No    Comment: quit 2012   Drug use: No    Comment: quit in 2012   Sexual activity: Yes    Birth control/protection: None  Other Topics Concern   Not on file  Social History Narrative   Not on file   Social Drivers of Health   Financial Resource Strain: Low Risk  (09/09/2023)   Overall Financial Resource Strain (CARDIA)    Difficulty of Paying Living Expenses: Not hard at all  Food Insecurity: No Food Insecurity (09/09/2023)   Hunger Vital Sign    Worried About Running Out of Food in the Last Year: Never true    Ran Out of Food in the Last Year: Never true  Transportation Needs: No Transportation Needs (09/09/2023)   PRAPARE - Scientist, research (physical sciences) (Medical): No    Lack of Transportation (Non-Medical): No  Physical Activity: Inactive (09/09/2023)   Exercise Vital Sign    Days of Exercise per Week: 0 days    Minutes of Exercise per Session: 0 min  Stress: No Stress Concern Present (09/09/2023)   [employer] of Occupational Health - Occupational Stress Questionnaire    Feeling of Stress : Not at all  Social Connections: Moderately Integrated (09/09/2023)  Social Connection and Isolation Panel [NHANES]    Frequency of Communication with Friends and Family: More than three times a week    Frequency of Social Gatherings with Friends and Family: Three times a week    Attends Religious Services: More than 4 times per year    Active Member of Clubs or Organizations: Yes    Attends Banker Meetings: More than 4 times per year    Marital Status: Divorced  Intimate Partner Violence: Not At Risk (09/09/2023)   Humiliation, Afraid, Rape, and Kick questionnaire    Fear of Current or Ex-Partner: No    Emotionally Abused: No    Physically Abused: No    Sexually Abused: No   Family History  Problem Relation Age of Onset   Diabetes Mother    Glaucoma Mother     Objective: Office vital signs reviewed. BP 126/78   Pulse (!) 102   Temp 98.2 F (36.8 C)   Ht 6' (1.829 m)   Wt 259 lb (117.5 kg)   SpO2 97%   BMI 35.13 kg/m   Physical Examination:  General: Awake, alert, nontoxic elderly male, No acute distress HEENT: sclera white, MMM.  Exotropia of the right eye Cardio: regular rate and rhythm, S1S2 heard, no murmurs appreciated Pulm: clear to auscultation bilaterally, no wheezes, rhonchi or rales; normal work of breathing on room air Psych: Mood stable, speech normal, affect appropriate MSK: Ambulating with use of cane    09/09/2023   12:59 PM 08/18/2023    2:38 PM 04/15/2023    2:06 PM  Depression screen PHQ 2/9  Decreased Interest 1 1 0  Down, Depressed, Hopeless 2 2 0  PHQ - 2 Score 3 3 0  Altered  sleeping 1 1 0  Tired, decreased energy 0 0 0  Change in appetite 1 1 0  Feeling bad or failure about yourself  0 0 0  Trouble concentrating 0 0 0  Moving slowly or fidgety/restless 1 1 0  Suicidal thoughts 0 0 0  PHQ-9 Score 6 6 0  Difficult doing work/chores  Somewhat difficult Not difficult at all      08/18/2023    2:37 PM 04/15/2023    2:06 PM 01/13/2023    2:13 PM 12/02/2022    1:18 PM  GAD 7 : Generalized Anxiety Score  Nervous, Anxious, on Edge 0 0 0 0  Control/stop worrying 1 0 0 0  Worry too much - different things 0 0 0 0  Trouble relaxing 1 0 1 0  Restless 0 0 0 0  Easily annoyed or irritable 0 0 0 0  Afraid - awful might happen 0 0 0 0  Total GAD 7 Score 2 0 1 0  Anxiety Difficulty  Not difficult at all Not difficult at all Not difficult at all    Assessment/ Plan: 73 y.o. male   GAD (generalized anxiety disorder) - Plan: ToxASSURE Select 13 (MW), Urine, ALPRAZolam (XANAX) 0.5 MG tablet  Benzodiazepine dependence (HCC) - Plan: ToxASSURE Select 13 (MW), Urine, ALPRAZolam (XANAX) 0.5 MG tablet  Controlled substance agreement signed - Plan: ToxASSURE Select 13 (MW), Urine, ALPRAZolam (XANAX) 0.5 MG tablet, Drug Screen 10 W/Conf, Se  Loose stools - Plan: Lactobacillus-Inulin (CULTURELLE DIGESTIVE DAILY) CAPS, Ambulatory referral to Gastroenterology  Aortic atherosclerosis (HCC)  I have renewed his alprazolam and reiterated that if he is running out of medication and or cannot find a refill at the pharmacy then he is supposed to contact this office.  I have updated his UDS and CSA and completed a national narcotic database reviewed with no red flags.  He is to continue utilizing the alprazolam extremely sparingly and in fact I advised him to use only at half dose going forward as I worry about him being now desensitized to the medication and perhaps having too much of a response to it.  He will continue dual therapy with SSRI and tetracyclic antidepressant as this seems  to be working well for him.  Okay to discontinue BuSpar  Recommended Culturelle for ongoing loose stools without evident etiology.  I reviewed his CT abdomen pelvis and this was unrevealing.  I referred him to gastroenterology.  Last colonoscopy 2016  Aortic atherosclerosis noted on CT abdomen pelvis.  He should continue rosuvastatin, diet modification to reduce cardiovascular risk  Eliodoro Guerin, DO [facility] [phone]

## 2023-11-18 ENCOUNTER — Encounter: Payer: Self-pay | Admitting: Gastroenterology

## 2023-11-18 ENCOUNTER — Telehealth: Payer: Self-pay | Admitting: Family Medicine

## 2023-11-18 ENCOUNTER — Telehealth: Payer: Self-pay

## 2023-11-18 ENCOUNTER — Other Ambulatory Visit (HOSPITAL_COMMUNITY): Payer: Self-pay

## 2023-11-18 NOTE — Telephone Encounter (Signed)
 PA request has been Submitted. New Encounter has been or will be created for follow up. For additional info see Pharmacy Prior Auth telephone encounter from 11/18/23.

## 2023-11-18 NOTE — Telephone Encounter (Unsigned)
 Copied from CRM [id number]. Topic: Clinical - Prescription Issue >> Nov 12, 2023 10:58 AM Ivette P wrote: Reason for CRM: Pt daughter Marc Senior called in to request a prior authorization with a prescription order be sent to insurance. Fax number provided below.   Prescription needed: Continuous Glucose Receiver (FREESTYLE LIBRE 3 READER) DEVI  [facility]   Fax number [phone]  If any further questions please call Daughter  Marc Senior [phone] >> Nov 18, 2023  9:42 AM Felizardo Hotter wrote: Pt daughter Marc Senior called in to request a prior authorization with a prescription order be sent to insurance. Fax number provided below. Regarding Continuous Glucose Receiver (FREESTYLE LIBRE 3 READER) DEVI [facility] Fax number [phone] Please call Daughter Marc Senior [phone] when authorization has been obtained.

## 2023-11-18 NOTE — Telephone Encounter (Signed)
 Pharmacy Patient Advocate Encounter   Received notification from Pt Calls Messages that prior authorization for FreeStyle Libre 3 Reader device is required/requested.   Insurance verification completed.   The patient is insured through [facility] .   Per test claim: PA required; PA submitted to above mentioned insurance via CoverMyMeds Key/confirmation #/EOC [id number] Status is pending

## 2023-11-21 ENCOUNTER — Other Ambulatory Visit (HOSPITAL_COMMUNITY): Payer: Self-pay

## 2023-11-21 NOTE — Telephone Encounter (Signed)
 Pharmacy Patient Advocate Encounter  Received notification from [facility] that Prior Authorization for FreeStyle Libre 3 Reader device  has been APPROVED from 11/19/23 to 08/04/24. Ran test claim, Copay is $0. This test claim was processed through [facility]- copay amounts may vary at other pharmacies due to pharmacy/plan contracts, or as the patient moves through the different stages of their insurance plan.   PA #/Case ID/Reference #: [id number]

## 2023-11-25 LAB — DRUG SCREEN 10 W/CONF, SERUM
Amphetamines, IA: NEGATIVE ng/mL
Barbiturates, IA: NEGATIVE ug/mL
Benzodiazepines, IA: NEGATIVE ng/mL
Cocaine & Metabolite, IA: NEGATIVE ng/mL
Methadone, IA: NEGATIVE ng/mL
Opiates, IA: NEGATIVE ng/mL
Oxycodones, IA: NEGATIVE ng/mL
Phencyclidine, IA: NEGATIVE ng/mL
Propoxyphene, IA: NEGATIVE ng/mL
THC(Marijuana) Metabolite, IA: NEGATIVE ng/mL

## 2023-11-27 ENCOUNTER — Ambulatory Visit

## 2023-11-28 ENCOUNTER — Other Ambulatory Visit: Payer: Self-pay | Admitting: Family Medicine

## 2023-11-28 DIAGNOSIS — F411 Generalized anxiety disorder: Secondary | ICD-10-CM

## 2023-11-28 DIAGNOSIS — R5383 Other fatigue: Secondary | ICD-10-CM

## 2023-11-28 DIAGNOSIS — R531 Weakness: Secondary | ICD-10-CM

## 2023-11-28 DIAGNOSIS — F1320 Sedative, hypnotic or anxiolytic dependence, uncomplicated: Secondary | ICD-10-CM

## 2023-12-02 DIAGNOSIS — E1165 Type 2 diabetes mellitus with hyperglycemia: Secondary | ICD-10-CM | POA: Diagnosis not present

## 2023-12-03 NOTE — Telephone Encounter (Signed)
 [facility] is shipping Libre 3 PLUS supplies to patient.  They should arrive by next week!

## 2023-12-06 NOTE — Progress Notes (Unsigned)
 GI Office Note    Referring Provider: Eliodoro Guerin, DO Primary Care Physician:  Eliodoro Guerin, DO  Primary Gastroenterologist:  Chief Complaint   No chief complaint on file.    History of Present Illness   [name] is a 73 y.o. male presenting today at the request of Dr. Bonnell Butcher for further evaluation of loose stools.   Recent cystitis treated with Keflex .  CT A/P with contrast 10/2023 IMPRESSION: 1. No acute localizing process in the abdomen or pelvis. 2. Sigmoid colon diverticulosis. 3. Aortic atherosclerosis.   Medications   Current Outpatient Medications  Medication Sig Dispense Refill   ALPRAZolam  (XANAX ) 0.5 MG tablet Take 0.5-1 tablets (0.25-0.5 mg total) by mouth daily as needed (panic attacks). 90 tablet 1   amLODipine  (NORVASC ) 5 MG tablet Take 1 tablet (5 mg total) by mouth daily. 90 tablet 3   apixaban  (ELIQUIS ) 5 MG TABS tablet Take 1 tablet (5 mg total) by mouth 2 (two) times daily. 180 tablet 3   citalopram  (CELEXA ) 20 MG tablet TAKE 1 TABLET BY MOUTH EVERY DAY 90 tablet 1   Continuous Glucose Receiver (FREESTYLE LIBRE 3 READER) DEVI with compatible Freestyle Libre 3 sensor to monitor glucose continuously. DX: E11.65 1 each 1   famotidine  (PEPCID ) 20 MG tablet TAKE 1 TABLET BY MOUTH TWICE A DAY 180 tablet 0   furosemide  (LASIX ) 20 MG tablet TAKE 1 TABLET BY MOUTH EVERY DAY 15 tablet 0   glucose blood (ONETOUCH VERIO) test strip Use as instructed to monitor glucose 4 times daily (use as back up method for CGM) 100 each 12   insulin  degludec (TRESIBA  FLEXTOUCH) 200 UNIT/ML FlexTouch Pen Inject 36 Units into the skin in the morning.     Insulin  Pen Needle (B-D ULTRAFINE III SHORT PEN) 31G X 8 MM MISC Use to give insulin  daily Dx E11.9 100 each 3   Lactobacillus-Inulin (CULTURELLE DIGESTIVE DAILY) CAPS Take as directed daily for gut health 100 capsule 3   Lancets (ONETOUCH ULTRASOFT) lancets Use as instructed to monitor glucose 4 times  daily (use as back up method for CGM). 100 each 12   lisinopril  (ZESTRIL ) 40 MG tablet Take 1 tablet (40 mg total) by mouth daily. 90 tablet 3   metFORMIN  (GLUCOPHAGE -XR) 500 MG 24 hr tablet TAKE 2 TABLETS BY MOUTH TWICE A DAY WITH FOOD 360 tablet 0   mirtazapine  (REMERON ) 45 MG tablet Take 1 tablet (45 mg total) by mouth at bedtime. 90 tablet 3   omeprazole (PRILOSEC) 20 MG capsule Take 20 mg by mouth daily.     rosuvastatin  (CRESTOR ) 20 MG tablet Take 1 tablet (20 mg) by mouth daily 90 tablet 3   Semaglutide, 1 MG/DOSE, (OZEMPIC, 1 MG/DOSE,) 2 MG/1.5ML SOPN Inject 1 mg into the skin once a week. MED VIA NOVO NORDISK PATIENT ASSISTANCE PROGRAM Concha Deed [place], [facility] assisting with this)     sildenafil  (VIAGRA ) 50 MG tablet Take 1 tablet (50 mg total) by mouth daily as needed for erectile dysfunction. 10 tablet 2   No current facility-administered medications for this visit.    Allergies   Allergies as of 12/08/2023 - Review Complete 11/17/2023  Allergen Reaction Noted   Farxiga  [dapagliflozin ] Other (See Comments) 09/10/2022    Past Medical History   Past Medical History:  Diagnosis Date   Anxiety    Arthritis    Diabetes mellitus without complication (HCC)    GERD (gastroesophageal reflux disease)    Hypercholesteremia  Hypertension     Past Surgical History   Past Surgical History:  Procedure Laterality Date   boils     removed form back of skull   CATARACT EXTRACTION W/PHACO Right 04/02/2013   Procedure: CATARACT EXTRACTION PHACO AND INTRAOCULAR LENS PLACEMENT (IOC);  Surgeon: Clay Cummins, MD;  Location: [facility];  Service: Ophthalmology;  Laterality: Right;  CDE 7.00   CATARACT EXTRACTION W/PHACO Left 10/04/2013   Procedure: CATARACT EXTRACTION PHACO AND INTRAOCULAR LENS PLACEMENT (IOC);  Surgeon: Clay Cummins, MD;  Location: [facility];  Service: Ophthalmology;  Laterality: Left;  CDE:1.46   COLONOSCOPY N/A 07/03/2015   Procedure: COLONOSCOPY;  Surgeon: Ruby Corporal,  MD;  Location: [facility];  Service: Endoscopy;  Laterality: N/A;  830   EYE SURGERY Right    "valve replaced and stent placed" per pt to reroute the fluid in his eye   ROTATOR CUFF REPAIR Right     Past Family History   Family History  Problem Relation Age of Onset   Diabetes Mother    Glaucoma Mother     Past Social History   Social History   Socioeconomic History   Marital status: Divorced    Spouse name: Not on file   Number of children: Not on file   Years of education: Not on file   Highest education level: Not on file  Occupational History   Not on file  Tobacco Use   Smoking status: Former    Current packs/day: 0.00    Average packs/day: 0.5 packs/day for 41.0 years (20.5 ttl pk-yrs)    Types: Cigarettes    Start date: 03/01/1971    Quit date: 02/29/2012    Years since quitting: 11.7   Smokeless tobacco: Never  Vaping Use   Vaping status: Never Used  Substance and Sexual Activity   Alcohol use: No    Comment: quit 2012   Drug use: No    Comment: quit in 2012   Sexual activity: Yes    Birth control/protection: None  Other Topics Concern   Not on file  Social History Narrative   Not on file   Social Drivers of Health   Financial Resource Strain: Low Risk  (09/09/2023)   Overall Financial Resource Strain (CARDIA)    Difficulty of Paying Living Expenses: Not hard at all  Food Insecurity: No Food Insecurity (09/09/2023)   Hunger Vital Sign    Worried About Running Out of Food in the Last Year: Never true    Ran Out of Food in the Last Year: Never true  Transportation Needs: No Transportation Needs (09/09/2023)   PRAPARE - Administrator, Civil Service (Medical): No    Lack of Transportation (Non-Medical): No  Physical Activity: Inactive (09/09/2023)   Exercise Vital Sign    Days of Exercise per Week: 0 days    Minutes of Exercise per Session: 0 min  Stress: No Stress Concern Present (09/09/2023)   [employer] of Occupational Health -  Occupational Stress Questionnaire    Feeling of Stress : Not at all  Social Connections: Moderately Integrated (09/09/2023)   Social Connection and Isolation Panel [NHANES]    Frequency of Communication with Friends and Family: More than three times a week    Frequency of Social Gatherings with Friends and Family: Three times a week    Attends Religious Services: More than 4 times per year    Active Member of Clubs or Organizations: Yes    Attends Club  or Organization Meetings: More than 4 times per year    Marital Status: Divorced  Intimate Partner Violence: Not At Risk (09/09/2023)   Humiliation, Afraid, Rape, and Kick questionnaire    Fear of Current or Ex-Partner: No    Emotionally Abused: No    Physically Abused: No    Sexually Abused: No    Review of Systems   General: Negative for anorexia, weight loss, fever, chills, fatigue, weakness. Eyes: Negative for vision changes.  ENT: Negative for hoarseness, difficulty swallowing , nasal congestion. CV: Negative for chest pain, angina, palpitations, dyspnea on exertion, peripheral edema.  Respiratory: Negative for dyspnea at rest, dyspnea on exertion, cough, sputum, wheezing.  GI: See history of present illness. GU:  Negative for dysuria, hematuria, urinary incontinence, urinary frequency, nocturnal urination.  MS: Negative for joint pain, low back pain.  Derm: Negative for rash or itching.  Neuro: Negative for weakness, abnormal sensation, seizure, frequent headaches, memory loss,  confusion.  Psych: Negative for anxiety, depression, suicidal ideation, hallucinations.  Endo: Negative for unusual weight change.  Heme: Negative for bruising or bleeding. Allergy: Negative for rash or hives.  Physical Exam   There were no vitals taken for this visit.   General: Well-nourished, well-developed in no acute distress.  Head: Normocephalic, atraumatic.   Eyes: Conjunctiva pink, no icterus. Mouth: Oropharyngeal mucosa moist and pink , no  lesions erythema or exudate. Neck: Supple without thyromegaly, masses, or lymphadenopathy.  Lungs: Clear to auscultation bilaterally.  Heart: Regular rate and rhythm, no murmurs rubs or gallops.  Abdomen: Bowel sounds are normal, nontender, nondistended, no hepatosplenomegaly or masses,  no abdominal bruits or hernia, no rebound or guarding.   Rectal: *** Extremities: No lower extremity edema. No clubbing or deformities.  Neuro: Alert and oriented x 4 , grossly normal neurologically.  Skin: Warm and dry, no rash or jaundice.   Psych: Alert and cooperative, normal mood and affect.  Labs   Lab Results  Component Value Date   NA 141 11/06/2023   CL 101 11/06/2023   K 4.9 11/06/2023   CO2 21 11/06/2023   BUN 13 11/06/2023   CREATININE 1.15 11/06/2023   EGFR 67 11/06/2023   CALCIUM  9.8 11/06/2023   ALBUMIN 4.3 11/06/2023   GLUCOSE 118 (H) 11/06/2023   Lab Results  Component Value Date   ALT 23 11/06/2023   AST 28 11/06/2023   ALKPHOS 73 11/06/2023   BILITOT 0.4 11/06/2023   Lab Results  Component Value Date   WBC 7.7 11/06/2023   HGB 14.8 11/06/2023   HCT 46.4 11/06/2023   MCV 83 11/06/2023   PLT 278 11/06/2023   Lab Results  Component Value Date   TSH 0.745 11/06/2023   Lab Results  Component Value Date   HGBA1C 7.5 (A) 10/21/2023    Imaging Studies   No results found.  Assessment       PLAN   ***   Trudie Fuse. Harles Lied, MHS, PA-C [facility]

## 2023-12-08 ENCOUNTER — Ambulatory Visit: Admitting: Gastroenterology

## 2023-12-08 ENCOUNTER — Telehealth: Payer: Self-pay | Admitting: Gastroenterology

## 2023-12-08 ENCOUNTER — Encounter: Payer: Self-pay | Admitting: Gastroenterology

## 2023-12-08 VITALS — BP 119/64 | HR 80 | Temp 98.6°F | Ht 72.0 in | Wt 262.4 lb

## 2023-12-08 DIAGNOSIS — R198 Other specified symptoms and signs involving the digestive system and abdomen: Secondary | ICD-10-CM | POA: Diagnosis not present

## 2023-12-08 DIAGNOSIS — Z860100 Personal history of colon polyps, unspecified: Secondary | ICD-10-CM

## 2023-12-08 MED ORDER — PHILLIPS COLON HEALTH PO CAPS: 1.0000 | ORAL_CAPSULE | Freq: Every day | ORAL | 1 refills | Status: AC

## 2023-12-08 NOTE — Telephone Encounter (Signed)
 Patient not ready to schedule colonoscopy today. He wants to shoot for 08/2024.   Please NIC for OV 08/2024 for us  to set him up for colonoscopy.

## 2023-12-08 NOTE — Patient Instructions (Addendum)
 Start Charles Garnet' Colon Health one daily with a meal for one month.   Please call if your stools do not return to normal. I would recommend checking for stool infection at that time.  Your abdominal wall is uneven likely related to significant weight loss. I do not feel a hernia or mass. Your CT scan is very reassuring. Let us  know if you have abdominal pain.    You are actually due for a colonoscopy now since you had polyps removed in 2016. Per your request, we will reach out in 08/2024 to schedule your colonoscopy.  Call with any questions or concerns.

## 2023-12-21 ENCOUNTER — Other Ambulatory Visit (HOSPITAL_BASED_OUTPATIENT_CLINIC_OR_DEPARTMENT_OTHER): Payer: Self-pay | Admitting: Cardiology

## 2023-12-21 DIAGNOSIS — I5032 Chronic diastolic (congestive) heart failure: Secondary | ICD-10-CM

## 2023-12-24 ENCOUNTER — Other Ambulatory Visit (HOSPITAL_BASED_OUTPATIENT_CLINIC_OR_DEPARTMENT_OTHER): Payer: Self-pay | Admitting: Cardiology

## 2023-12-24 DIAGNOSIS — I5032 Chronic diastolic (congestive) heart failure: Secondary | ICD-10-CM

## 2023-12-25 ENCOUNTER — Telehealth: Payer: Self-pay

## 2023-12-25 NOTE — Telephone Encounter (Signed)
 Attempted to call pt to get dm eye exam set up or request records from most recent one - no answer - vm is full   Please get pt set up for a apt 06/2 06/3 or 06/04 or get the name of the office and dr name

## 2024-01-01 ENCOUNTER — Telehealth: Payer: Self-pay | Admitting: Pharmacist

## 2024-01-01 NOTE — Telephone Encounter (Signed)
   Patient enrolled in the Novo nordisk patient assistance program for Ozempic/Tresiba .  Attempted to contact patient, however no answer and VM is full.  Patient has been stable on current regimen.  Will schedule patient for PharmD follow up.  He has endo f/u on 02/23/24. PharmD f/u 01/08/24  Marvell Slider, PharmD, BCACP, CPP Clinical Pharmacist, [facility]

## 2024-01-02 ENCOUNTER — Telehealth: Payer: Self-pay | Admitting: Family Medicine

## 2024-01-02 NOTE — Telephone Encounter (Signed)
 I called and spoke with patient and made him aware that we have his Insulin  order that needs to be picked up. Pt was unaware of this but said he would come by the office today to pick it up.

## 2024-01-08 ENCOUNTER — Other Ambulatory Visit: Admitting: Pharmacist

## 2024-01-08 DIAGNOSIS — Z794 Long term (current) use of insulin: Secondary | ICD-10-CM

## 2024-01-08 DIAGNOSIS — E119 Type 2 diabetes mellitus without complications: Secondary | ICD-10-CM

## 2024-01-08 DIAGNOSIS — Z7985 Long-term (current) use of injectable non-insulin antidiabetic drugs: Secondary | ICD-10-CM

## 2024-01-08 NOTE — Progress Notes (Signed)
 01/08/2024 Name: [name] MRN: [id number] DOB: 04/26/1951  Chief Complaint  Patient presents with   Diabetes    [name] is a 73 y.o. year old male who presented for a telephone visit.   They were referred to the pharmacist by their PCP for assistance in managing diabetes and medication access.    Subjective:  Patient reports he is doing great.   He is actively engaged with PCP/PharmD and Endocrinology.  He reports his blood sugars are the best they have ever been.  Care Team: Primary Care Provider: Eliodoro Guerin, DO  Endocrinologist [name]; Next Scheduled Visit: 02/23/24  Medication Access/Adherence  [facility]  [phone] [place] - [address] [place] [address] Phone: [phone] Fax: [phone]  [facility] [address] [place] - [address] [place] [address] Phone: [phone] Fax: [phone]  [facility] [place], [place] - [address] [facility] [address] [place] [address] Phone: [phone] Fax: [phone]  [facility] - [place], [place] - [address] [place] [address] Phone: [phone] Fax: [phone]   Patient reports affordability concerns with their medications: Yes  enrolled in PAP Patient reports access/transportation concerns to their pharmacy: No  Patient reports adherence concerns with their medications:  No     Diabetes:  Current medications: Tresiba  (U200) (insulin  degludec) 32-35 units nightly, Ozempic (semaglutide) 1 mg weekly (Mondays), metforminXR 1000 mg BID Medications tried in the past: dapagliflozin , empagliflozin  (syncope, dizziness with both), novolog  meal time insulin   Current glucose readings: 97-121, 118  Using new FSL 3 PLUS CGM Getting CGM from advanced [employer] supply company via parachute portal  Date of Download: 01/08/24 % Time CGM is active: 92% Average  Glucose: 124 mg/dL Glucose Management Indicator: n/a  Time in Goal:  - Time in range 70-180: 98% - Time above range: 2 % - Time below range: 0%  Patient denies hypoglycemic s/sx including dizziness, shakiness, sweating. Patient denies hyperglycemic symptoms including polyuria, polydipsia, polyphagia, nocturia, neuropathy, blurred vision.  Current meal patterns:  Discussed meal planning options and Plate method for healthy eating Avoid sugary drinks and desserts Incorporate balanced protein, non starchy veggies, 1 serving of carbohydrate with each meal Increase water  intake Increase physical activity as able  Enjoys eggs, only occasional soda, water , hot dogs  Current physical activity: limited  Current medication access support: novo nordisk PAP (Tresiba , Ozempic)   Objective:  Lab Results  Component Value Date   HGBA1C 7.5 (A) 10/21/2023    Lab Results  Component Value Date   CREATININE 1.15 11/06/2023   BUN 13 11/06/2023   NA 141 11/06/2023   K 4.9 11/06/2023   CL 101 11/06/2023   CO2 21 11/06/2023    Lab Results  Component Value Date   CHOL 103 06/11/2021   HDL 27 (L) 06/11/2021   LDLCALC 57 06/11/2021   TRIG 98 06/11/2021   CHOLHDL 3.8 06/11/2021    Medications Reviewed Today     Reviewed by [name], [facility] (Pharmacist) on 01/08/24 at 1112  Med List Status: <None>   Medication Order Taking? Sig Documenting Provider Last Dose Status Informant  ALPRAZolam  (XANAX ) 0.5 MG tablet [id number]  Take 0.5-1 tablets (0.25-0.5 mg total) by mouth daily as needed (panic attacks). Charles Grange M, DO  Active   amLODipine  (NORVASC ) 5 MG tablet [id number]  Take 1  tablet (5 mg total) by mouth daily. Charles Grange M, DO  Active   apixaban  (ELIQUIS ) 5 MG TABS tablet [id number]  Take 1 tablet (5 mg total) by mouth 2 (two) times daily. Charles Grange M, DO  Expired 12/27/23 2359   citalopram  (CELEXA ) 20 MG tablet [id number]  TAKE 1 TABLET BY MOUTH EVERY DAY  Dettinger, Charles Sabin, MD  Active   Continuous Glucose Receiver (FREESTYLE LIBRE 3 READER) DEVI [id number]  with compatible Freestyle Libre 3 sensor to monitor glucose continuously. DX: E11.65 Charles Guerin, DO  Active            Med Note [name], Donata Fryer   Thu Jan 08, 2024  9:36 AM) Via [employer] via parachute portal  Continuous Glucose Sensor (FREESTYLE LIBRE 3 PLUS SENSOR) MISC [id number] Yes by Does not apply route. Change sensor every 15 days. [provider]  Active            Med Note [name], Haig Levan Jan 08, 2024  9:39 AM) Via [employer] supply via parachute portal  famotidine  (PEPCID ) 20 MG tablet [id number]  TAKE 1 TABLET BY MOUTH TWICE A DAY Berger, Charles M, DO  Active   glucose blood (ONETOUCH VERIO) test strip [id number]  Use as instructed to monitor glucose 4 times daily (use as back up method for CGM) Charles Hals, [name]  Active Self  insulin  degludec (TRESIBA  FLEXTOUCH) 200 UNIT/ML FlexTouch Pen [id number]  Inject 36 Units into the skin in the morning.  Patient taking differently: Inject 35 Units into the skin at bedtime.   Charles Hals, [name]  Active            Med Note [name], Sivan Cuello D   Thu Jan 08, 2024  9:35 AM) Via novo nordisk patient assistance program    Insulin  Pen Needle (B-D ULTRAFINE III SHORT PEN) 31G X 8 MM MISC [id number]  Use to give insulin  daily Dx E11.9 Charles Jules, [name]  Active Self  Lancets [name] ULTRASOFT) lancets [id number]  Use as instructed to monitor glucose 4 times daily (use as back up method for CGM). Charles Hals, [name]  Active Self  lisinopril  (ZESTRIL ) 40 MG tablet [id number]  Take 1 tablet (40 mg total) by mouth daily. Charles Grange M, DO  Active   metFORMIN  (GLUCOPHAGE -XR) 500 MG 24 hr tablet [id number]  TAKE 2 TABLETS BY MOUTH TWICE A DAY WITH FOOD Charles Grange M, DO  Active   mirtazapine  (REMERON ) 45 MG tablet [id number]  Take 1 tablet (45 mg total) by mouth at bedtime. Charles Grange M,  DO  Active   omeprazole (PRILOSEC) 20 MG capsule [id number]  Take 20 mg by mouth daily. [provider]  Active Self  Probiotic Product [facility]) CAPS [id number]  Take 1 capsule by mouth daily. Lanney Pitts, PA-C  Active   rosuvastatin  (CRESTOR ) 20 MG tablet [id number]  Take 1 tablet (20 mg) by mouth daily Berger, Charles M, DO  Active   Semaglutide, 1 MG/DOSE, (OZEMPIC, 1 MG/DOSE,) 2 MG/1.5ML SOPN [id number]  Inject 1 mg into the skin once a week. MED VIA NOVO NORDISK PATIENT ASSISTANCE PROGRAM Concha Deed [place], [facility] assisting with this) [provider]  Active            Med Note [name], Tyna Huertas D   Thu Jan 08, 2024  9:35 AM) Via novo nordisk patient assistance program    sildenafil  (VIAGRA ) 50 MG tablet [id number]  Take 1 tablet (  50 mg total) by mouth daily as needed for erectile dysfunction. Charles Guerin, DO  Active Self  Med List Note Doretha Ganja 03/31/13 1303): Lisinopril  (strength)              Assessment/Plan:   Diabetes: - Currently controlled - Reviewed long term cardiovascular and renal outcomes of uncontrolled blood sugar - Reviewed goal A1c, goal fasting, and goal 2 hour post prandial glucose - Recommend to :   Education provided on new libre 3 PLUS CGM (patient has reader/phone not compatible)   Continue current regimen  Picked up Ozempic/Tresiba  from us  this week (4 month supply) - Patient denies personal or family history of multiple endocrine neoplasia type 2, medullary thyroid  cancer; personal history of pancreatitis or gallbladder disease. - Recommend to check glucose using Libre 3 PLUS CGM    Follow Up Plan: 3 months  Marvell Slider, PharmD, BCACP, CPP Clinical Pharmacist, [facility]

## 2024-01-11 ENCOUNTER — Other Ambulatory Visit: Payer: Self-pay | Admitting: Family Medicine

## 2024-01-11 DIAGNOSIS — R12 Heartburn: Secondary | ICD-10-CM

## 2024-01-20 ENCOUNTER — Telehealth: Payer: Self-pay | Admitting: Pharmacy Technician

## 2024-01-20 NOTE — Progress Notes (Signed)
   01/20/2024  Patient ID: [name], male   DOB: April 09, 1951, 73 y.o.   MRN: [id number]  Patient engaged with clinical pharmacist for management of diabetes on 01/08/24. Outreach by [employer] technician was requested.   Outreached patient to discuss diabetes medication management.Automated message came on stating voicemail was full.  Quintavious Rinck, CPhT [facility] Office: [phone] Email: [email or web address] .

## 2024-01-22 ENCOUNTER — Encounter: Payer: Self-pay | Admitting: Family Medicine

## 2024-01-22 ENCOUNTER — Telehealth: Payer: Self-pay | Admitting: Pharmacy Technician

## 2024-01-22 NOTE — Progress Notes (Signed)
   01/22/2024  Patient ID: [name], male   DOB: [birth year], 73 y.o.   MRN: [id number]  Patient engaged with clinical pharmacist for management of diabetes on 01/08/2024. Outreach by [employer] technician was requested.   Outreached patient to discuss diabetes medication management. .Automated message came on stating voicemail was full.    Muskan Bolla, CPhT [facility] Office: [phone] Email: [name]

## 2024-01-23 ENCOUNTER — Telehealth: Payer: Self-pay | Admitting: Pharmacy Technician

## 2024-01-23 NOTE — Progress Notes (Signed)
   01/23/2024  Patient ID: Denette Finner, male   DOB: 02-Feb-1951, 73 y.o.   MRN: [id number]  Patient engaged with clinical pharmacist for management of diabetes on 01/08/2024. Outreach by [employer] technician was requested.   3rd outreach to patient to discuss diabetes medication management. Automated message came on stating voicemail was full and it cannot accept any messages at this time.  Winn Muehl, CPhT [facility] Office: [phone] Email: [email or web address]

## 2024-02-11 ENCOUNTER — Telehealth: Payer: Self-pay | Admitting: Family Medicine

## 2024-02-11 ENCOUNTER — Other Ambulatory Visit (INDEPENDENT_AMBULATORY_CARE_PROVIDER_SITE_OTHER)

## 2024-02-11 DIAGNOSIS — Z794 Long term (current) use of insulin: Secondary | ICD-10-CM

## 2024-02-11 DIAGNOSIS — E119 Type 2 diabetes mellitus without complications: Secondary | ICD-10-CM

## 2024-02-11 DIAGNOSIS — Z7985 Long-term (current) use of injectable non-insulin antidiabetic drugs: Secondary | ICD-10-CM

## 2024-02-11 MED ORDER — METFORMIN HCL ER 500 MG PO TB24: ORAL_TABLET | ORAL | 0 refills | Status: DC

## 2024-02-11 NOTE — Progress Notes (Signed)
 02/11/2024 Name: [name] MRN: [id number] DOB: Jan 07, 1951  Chief Complaint  Patient presents with   Diabetes    [name] is a 73 y.o. year old male who presented for a telephone visit.   They were referred to the pharmacist by their PCP for assistance in managing diabetes.    Subjective:  Patient reports he doing okay.  He remains actively engaged with PCP/PharmD and Endocrinology. He reports his blood sugars continue to be controlled and he would like to get off of some of his metformin    Medication Access/Adherence  [facility]  [phone] [place] - [address] [place] [address] Phone: [phone] Fax: [phone]  [facility] [address], [place] - [address] [place] [address] Phone: [phone] Fax: [phone]  [facility] [place], [place] - [address] [facility] [address] [place] [address] Phone: [phone] Fax: [phone]  [facility] - [place], [place] - [address] [place] [address] Phone: [phone] Fax: [phone]   Patient reports affordability concerns with their medications: Yes --enrolled in Novo PAP Patient reports access/transportation concerns to their pharmacy: No  Patient reports adherence concerns with their medications:  No     Diabetes:  Current medications: Tresiba  (U200) (insulin  degludec) 32-35 units nightly, Ozempic (semaglutide) 1 mg weekly (Mondays), metforminXR 1000 mg BID Medications tried in the past: dapagliflozin , empagliflozin  (syncope, dizziness with both), novolog  meal time insulin   Current glucose readings: 97-121, 118   Using new FSL 3 PLUS CGM Getting libre 3 PLUS CGM from advanced diabetes supply company via parachute portal   Date of Download: 02/11/24 % Time CGM is active: 94% Average Glucose: 124 mg/dL Glucose Management Indicator: n/a  Time in Goal:  - Time in  range 70-180: 97% - Time above range: 3 % - Time below range: 0%   Patient denies hypoglycemic s/sx including dizziness, shakiness, sweating. Patient denies hyperglycemic symptoms including polyuria, polydipsia, polyphagia, nocturia, neuropathy, blurred vision.   Current meal patterns:  Discussed meal planning options and Plate method for healthy eating Avoid sugary drinks and desserts Incorporate balanced protein, non starchy veggies, 1 serving of carbohydrate with each meal Increase water  intake Increase physical activity as able   Enjoys eggs, only occasional soda, water , hot dogs   Current physical activity: limited   Current medication access support: novo nordisk PAP (Tresiba , Ozempic)   Objective:  Lab Results  Component Value Date   HGBA1C 7.5 (A) 10/21/2023    Lab Results  Component Value Date   CREATININE 1.15 11/06/2023   BUN 13 11/06/2023   NA 141 11/06/2023   K 4.9 11/06/2023   CL 101 11/06/2023   CO2 21 11/06/2023    Lab Results  Component Value Date   CHOL 103 06/11/2021   HDL 27 (L) 06/11/2021   LDLCALC 57 06/11/2021   TRIG 98 06/11/2021   CHOLHDL 3.8 06/11/2021    Medications Reviewed Today   Medications were not reviewed in this encounter       Assessment/Plan:  Diabetes: - Currently controlled - Reviewed long term cardiovascular and renal outcomes of uncontrolled blood sugar - Reviewed goal A1c, goal fasting, and goal 2 hour post prandial glucose - Recommend to :              Education provided on new libre 3 PLUS CGM (patient has reader/phone  not compatible)              Continue current regimen, but DECREASE metformin  1 tab with breakfast and 1 with dinner             Picked up Ozempic/Tresiba  from us  in June 2025 (4 month supply) - Patient denies personal or family history of multiple endocrine neoplasia type 2, medullary thyroid  cancer; personal history of pancreatitis or gallbladder disease. - Recommend to check glucose using  Libre 3 PLUS CGM  Follow Up Plan: 2 months   Mliss Tarry Griffin, PharmD, BCACP, CPP Clinical Pharmacist, [facility]

## 2024-02-11 NOTE — Telephone Encounter (Signed)
 Pt aware metformin  has been sent in.

## 2024-02-11 NOTE — Telephone Encounter (Signed)
  Prescription Request  02/11/2024   What is the name of the medication or equipment? METFORMIN   Have you contacted your pharmacy to request a refill? YES  Which [facility] [place]   Patient notified that their request is being sent to the clinical staff for review and that they should receive a response within 2 business days.

## 2024-02-19 MED ORDER — METFORMIN HCL ER 500 MG PO TB24: ORAL_TABLET | ORAL | Status: DC

## 2024-02-23 ENCOUNTER — Telehealth: Payer: Self-pay

## 2024-02-23 ENCOUNTER — Encounter: Payer: Self-pay | Admitting: Nurse Practitioner

## 2024-02-23 ENCOUNTER — Ambulatory Visit: Admitting: Nurse Practitioner

## 2024-02-23 VITALS — BP 116/72 | HR 73 | Ht 72.0 in | Wt 266.8 lb

## 2024-02-23 DIAGNOSIS — E782 Mixed hyperlipidemia: Secondary | ICD-10-CM

## 2024-02-23 DIAGNOSIS — E1122 Type 2 diabetes mellitus with diabetic chronic kidney disease: Secondary | ICD-10-CM | POA: Diagnosis not present

## 2024-02-23 DIAGNOSIS — I10 Essential (primary) hypertension: Secondary | ICD-10-CM

## 2024-02-23 DIAGNOSIS — Z7985 Long-term (current) use of injectable non-insulin antidiabetic drugs: Secondary | ICD-10-CM

## 2024-02-23 DIAGNOSIS — Z794 Long term (current) use of insulin: Secondary | ICD-10-CM | POA: Diagnosis not present

## 2024-02-23 DIAGNOSIS — Z7984 Long term (current) use of oral hypoglycemic drugs: Secondary | ICD-10-CM | POA: Diagnosis not present

## 2024-02-23 DIAGNOSIS — E119 Type 2 diabetes mellitus without complications: Secondary | ICD-10-CM | POA: Diagnosis not present

## 2024-02-23 DIAGNOSIS — N182 Chronic kidney disease, stage 2 (mild): Secondary | ICD-10-CM

## 2024-02-23 LAB — POCT GLYCOSYLATED HEMOGLOBIN (HGB A1C): Hemoglobin A1C: 7 % — AB (ref 4.0–5.6)

## 2024-02-23 MED ORDER — METFORMIN HCL ER 500 MG PO TB24: 500.0000 mg | ORAL_TABLET | Freq: Every day | ORAL | Status: DC

## 2024-02-23 MED ORDER — TRESIBA FLEXTOUCH 200 UNIT/ML ~~LOC~~ SOPN: 20.0000 [IU] | PEN_INJECTOR | SUBCUTANEOUS | Status: AC

## 2024-02-23 NOTE — Progress Notes (Signed)
 Endocrinology Follow Up Note       02/23/2024, 3:42 PM   Subjective:    Patient ID: [name], male    DOB: [birth year].  [name] is being seen in follow up after being seen in consultation for management of currently uncontrolled symptomatic diabetes requested by  Jolinda Norene HERO, DO.   Past Medical History:  Diagnosis Date   Anxiety    Arthritis    Diabetes mellitus without complication (HCC)    GERD (gastroesophageal reflux disease)    Hypercholesteremia    Hypertension     Past Surgical History:  Procedure Laterality Date   boils     three surgeries, removed form back of skull   CATARACT EXTRACTION W/PHACO Right 04/02/2013   Procedure: CATARACT EXTRACTION PHACO AND INTRAOCULAR LENS PLACEMENT (IOC);  Surgeon: Dow JULIANNA Burke, MD;  Location: [facility];  Service: Ophthalmology;  Laterality: Right;  CDE 7.00   CATARACT EXTRACTION W/PHACO Left 10/04/2013   Procedure: CATARACT EXTRACTION PHACO AND INTRAOCULAR LENS PLACEMENT (IOC);  Surgeon: Dow JULIANNA Burke, MD;  Location: [facility];  Service: Ophthalmology;  Laterality: Left;  CDE:1.46   COLONOSCOPY N/A 07/03/2015   Procedure: COLONOSCOPY;  Surgeon: Claudis RAYMOND Rivet, MD;  Location: [facility];  Service: Endoscopy;  Laterality: N/A;  830   EYE SURGERY Right    valve replaced and stent placed per pt to reroute the fluid in his eye   EYE SURGERY Left    ROTATOR CUFF REPAIR Right     Social History   Socioeconomic History   Marital status: Divorced    Spouse name: Not on file   Number of children: Not on file   Years of education: Not on file   Highest education level: Not on file  Occupational History   Not on file  Tobacco Use   Smoking status: Former    Current packs/day: 0.00    Average packs/day: 0.5 packs/day for 41.0 years (20.5 ttl pk-yrs)    Types: Cigarettes    Start date: 03/01/1971    Quit date: 02/29/2012    Years since  quitting: 11.9   Smokeless tobacco: Never  Vaping Use   Vaping status: Never Used  Substance and Sexual Activity   Alcohol use: No    Comment: quit 2012   Drug use: No    Comment: quit in 2012   Sexual activity: Yes    Birth control/protection: None  Other Topics Concern   Not on file  Social History Narrative   Not on file   Social Drivers of Health   Financial Resource Strain: Low Risk  (09/09/2023)   Overall Financial Resource Strain (CARDIA)    Difficulty of Paying Living Expenses: Not hard at all  Food Insecurity: No Food Insecurity (09/09/2023)   Hunger Vital Sign    Worried About Running Out of Food in the Last Year: Never true    Ran Out of Food in the Last Year: Never true  Transportation Needs: No Transportation Needs (09/09/2023)   PRAPARE - Administrator, Civil Service (Medical): No    Lack of Transportation (Non-Medical): No  Physical Activity: Inactive (09/09/2023)   Exercise Vital Sign  Days of Exercise per Week: 0 days    Minutes of Exercise per Session: 0 min  Stress: No Stress Concern Present (09/09/2023)   [employer] of Occupational Health - Occupational Stress Questionnaire    Feeling of Stress : Not at all  Social Connections: Moderately Integrated (09/09/2023)   Social Connection and Isolation Panel    Frequency of Communication with Friends and Family: More than three times a week    Frequency of Social Gatherings with Friends and Family: Three times a week    Attends Religious Services: More than 4 times per year    Active Member of Clubs or Organizations: Yes    Attends Banker Meetings: More than 4 times per year    Marital Status: Divorced    Family History  Problem Relation Age of Onset   Diabetes Mother    Glaucoma Mother    Colon cancer Neg Hx     Outpatient Encounter Medications as of 02/23/2024  Medication Sig   ALPRAZolam  (XANAX ) 0.5 MG tablet Take 0.5-1 tablets (0.25-0.5 mg total) by mouth daily as needed  (panic attacks).   amLODipine  (NORVASC ) 5 MG tablet Take 1 tablet (5 mg total) by mouth daily.   apixaban  (ELIQUIS ) 5 MG TABS tablet Take 1 tablet (5 mg total) by mouth 2 (two) times daily.   citalopram  (CELEXA ) 20 MG tablet TAKE 1 TABLET BY MOUTH EVERY DAY   Continuous Glucose Receiver (FREESTYLE LIBRE 3 READER) DEVI with compatible Freestyle Libre 3 sensor to monitor glucose continuously. DX: E11.65   Continuous Glucose Sensor (FREESTYLE LIBRE 3 PLUS SENSOR) MISC by Does not apply route. Change sensor every 15 days.   famotidine  (PEPCID ) 20 MG tablet TAKE 1 TABLET BY MOUTH TWICE A DAY   glucose blood (ONETOUCH VERIO) test strip Use as instructed to monitor glucose 4 times daily (use as back up method for CGM)   Insulin  Pen Needle (B-D ULTRAFINE III SHORT PEN) 31G X 8 MM MISC Use to give insulin  daily Dx E11.9   Lancets (ONETOUCH ULTRASOFT) lancets Use as instructed to monitor glucose 4 times daily (use as back up method for CGM).   lisinopril  (ZESTRIL ) 40 MG tablet Take 1 tablet (40 mg total) by mouth daily.   mirtazapine  (REMERON ) 45 MG tablet Take 1 tablet (45 mg total) by mouth at bedtime.   omeprazole (PRILOSEC) 20 MG capsule Take 20 mg by mouth daily.   Probiotic Product (PHILLIPS COLON HEALTH) CAPS Take 1 capsule by mouth daily.   rosuvastatin  (CRESTOR ) 20 MG tablet Take 1 tablet (20 mg) by mouth daily   Semaglutide, 1 MG/DOSE, (OZEMPIC, 1 MG/DOSE,) 2 MG/1.5ML SOPN Inject 1 mg into the skin once a week. MED VIA NOVO NORDISK PATIENT ASSISTANCE PROGRAM Ilah Harcourt, [facility] assisting with this)   sildenafil  (VIAGRA ) 50 MG tablet Take 1 tablet (50 mg total) by mouth daily as needed for erectile dysfunction.   [DISCONTINUED] insulin  degludec (TRESIBA  FLEXTOUCH) 200 UNIT/ML FlexTouch Pen Inject 36 Units into the skin in the morning. (Patient taking differently: Inject 35 Units into the skin at bedtime.)   [DISCONTINUED] metFORMIN  (GLUCOPHAGE -XR) 500 MG 24 hr tablet TAKE 1 TABLET BY MOUTH TWICE A  DAY WITH FOOD   insulin  degludec (TRESIBA  FLEXTOUCH) 200 UNIT/ML FlexTouch Pen Inject 20 Units into the skin daily.   metFORMIN  (GLUCOPHAGE -XR) 500 MG 24 hr tablet Take 1 tablet (500 mg total) by mouth daily with breakfast. TAKE 1 TABLET BY MOUTH TWICE A DAY WITH FOOD   [DISCONTINUED] metFORMIN  (  GLUCOPHAGE -XR) 500 MG 24 hr tablet TAKE 2 TABLETS BY MOUTH TWICE A DAY WITH FOOD   No facility-administered encounter medications on file as of 02/23/2024.    ALLERGIES: Allergies  Allergen Reactions   Farxiga  [Dapagliflozin ] Other (See Comments)    Dizziness, passed out/orthostasis    VACCINATION STATUS: Immunization History  Administered Date(s) Administered   Fluad Quad(high Dose 65+) 04/30/2017, 04/13/2019, 05/15/2022   Fluad Trivalent(High Dose 65+) 04/15/2023   Influenza Split 04/19/2014, 05/29/2020   Influenza, Seasonal, Injecte, Preservative Fre 05/09/2015   Influenza-Unspecified 05/18/2014, 05/09/2015, 04/22/2018, 05/24/2020, 05/19/2021   Moderna SARS-COV2 Booster Vaccination 12/13/2020   Moderna Sars-Covid-2 Vaccination 09/30/2019, 10/29/2019, 06/24/2020, 12/13/2020   Pneumococcal Conjugate-13 04/23/2018   Pneumococcal Polysaccharide-23 12/28/2015   Zoster Recombinant(Shingrix ) 02/12/2022   Zoster, Live 01/04/2016    Diabetes He presents for his follow-up diabetic visit. He has type 2 diabetes mellitus. Onset time: diagnosed at approx age of 85. His disease course has been improving. There are no hypoglycemic associated symptoms. Associated symptoms include foot paresthesias and weight loss. Pertinent negatives for diabetes include no blurred vision, no fatigue, no polydipsia, no polyuria, no visual change and no weakness. There are no hypoglycemic complications. Symptoms are improving. Diabetic complications include heart disease, impotence, nephropathy and retinopathy. Risk factors for coronary artery disease include diabetes mellitus, dyslipidemia, family history, hypertension,  male sex, obesity, sedentary lifestyle and tobacco exposure. Current diabetic treatment includes insulin  injections and oral agent (monotherapy) (and Ozempic). He is compliant with treatment most of the time. His weight is fluctuating minimally. He is following a generally healthy diet. When asked about meal planning, he reported none. He has not had a previous visit with a dietitian. He rarely participates in exercise. His home blood glucose trend is decreasing steadily. His overall blood glucose range is 110-130 mg/dl. (He presents today with his CGM showing at goal glycemic profile overall.  His POCT A1c today was 7%, improving from last visit of 7.5%.  Analysis of his CGM shows TIR 91%, TAR 9%, TBR 0%.  He denies any singificant hypoglycemia hypoglycemia, notes he has had it drop to mid 60s on several occasions.  ) An ACE inhibitor/angiotensin II receptor blocker is being taken. He does not see a podiatrist.Eye exam is current.  Hyperlipidemia This is a chronic problem. The current episode started more than 1 year ago. The problem is controlled. Recent lipid tests were reviewed and are normal. Exacerbating diseases include chronic renal disease, diabetes and obesity. Factors aggravating his hyperlipidemia include fatty foods. Current antihyperlipidemic treatment includes statins. The current treatment provides moderate improvement of lipids. Compliance problems include adherence to diet and adherence to exercise.  Risk factors for coronary artery disease include diabetes mellitus, dyslipidemia, family history, male sex, hypertension, obesity and a sedentary lifestyle.  Hypertension This is a chronic problem. The current episode started more than 1 year ago. The problem has been resolved since onset. The problem is controlled. Pertinent negatives include no blurred vision. There are no associated agents to hypertension. Risk factors for coronary artery disease include diabetes mellitus, dyslipidemia, family  history, male gender, obesity, sedentary lifestyle and smoking/tobacco exposure. Past treatments include ACE inhibitors and calcium  channel blockers. The current treatment provides moderate improvement. There are no compliance problems.  Hypertensive end-organ damage includes kidney disease, CAD/MI and retinopathy. Identifiable causes of hypertension include chronic renal disease.    Review of systems  Constitutional: + stable body weight,  current Body mass index is 36.18 kg/m., no fatigue, no subjective hyperthermia, no subjective hypothermia  Eyes: + blurry vision (bilateral retinopathy), legally blind in right eye, no xerophthalmia ENT: no sore throat, no nodules palpated in throat, no dysphagia/odynophagia, no hoarseness Cardiovascular: no chest pain, no shortness of breath, no palpitations, no leg swelling Respiratory: no cough, no shortness of breath Gastrointestinal: no nausea/vomiting/diarrhea Musculoskeletal: no muscle/joint aches, walks with cane for deconditioning Skin: no rashes, no hyperemia Neurological: + intermittent tremors, no numbness, no tingling, + intermittent dizziness Psychiatric: no depression, no anxiety   Objective:     BP 116/72 (BP Location: Left Arm, Patient Position: Sitting, Cuff Size: Large)   Pulse 73   Ht 6' (1.829 m)   Wt 266 lb 12.8 oz (121 kg)   BMI 36.18 kg/m   Wt Readings from Last 3 Encounters:  02/23/24 266 lb 12.8 oz (121 kg)  12/08/23 262 lb 6.4 oz (119 kg)  11/17/23 259 lb (117.5 kg)     BP Readings from Last 3 Encounters:  02/23/24 116/72  12/08/23 119/64  11/17/23 126/78     Physical Exam- Limited  Constitutional:  Body mass index is 36.18 kg/m. , not in acute distress, normal state of mind Eyes:  EOMI, no exophthalmos, right eye opaque conjunctiva Neck: Supple Musculoskeletal: no gross deformities, strength intact in all four extremities, no gross restriction of joint movements, walks with cane for deconditioning Skin:  no  rashes, no hyperemia Neurological: no tremor with outstretched hands   Diabetic Foot Exam - Simple   No data filed    CMP ( most recent) CMP     Component Value Date/Time   NA 141 11/06/2023 1504   K 4.9 11/06/2023 1504   CL 101 11/06/2023 1504   CO2 21 11/06/2023 1504   GLUCOSE 118 (H) 11/06/2023 1504   GLUCOSE 113 (H) 10/27/2023 1416   BUN 13 11/06/2023 1504   CREATININE 1.15 11/06/2023 1504   CALCIUM  9.8 11/06/2023 1504   PROT 7.5 11/06/2023 1504   ALBUMIN 4.3 11/06/2023 1504   AST 28 11/06/2023 1504   ALT 23 11/06/2023 1504   ALKPHOS 73 11/06/2023 1504   BILITOT 0.4 11/06/2023 1504   GFRNONAA >60 10/27/2023 1416   GFRAA 92 04/13/2020 0906     Diabetic Labs (most recent): Lab Results  Component Value Date   HGBA1C 7.0 (A) 02/23/2024   HGBA1C 7.5 (A) 10/21/2023   HGBA1C 8.6 (A) 06/23/2023   MICROALBUR 150 12/05/2020     Lipid Panel ( most recent) Lipid Panel     Component Value Date/Time   CHOL 103 06/11/2021 0958   TRIG 98 06/11/2021 0958   HDL 27 (L) 06/11/2021 0958   CHOLHDL 3.8 06/11/2021 0958   LDLCALC 57 06/11/2021 0958   LABVLDL 19 06/11/2021 0958      Lab Results  Component Value Date   TSH 0.745 11/06/2023   TSH 0.781 08/16/2022   TSH 0.663 10/29/2021   TSH 0.280 (L) 07/05/2019   TSH 0.124 (L) 04/28/2019   TSH 0.059 (L) 04/06/2019           Assessment & Plan:   1) Uncontrolled Type 2 Diabetes with long-term use of insulin   - [name] has currently uncontrolled symptomatic type 2 DM since 73 years of age.   He presents today with his CGM showing at goal glycemic profile overall.  His POCT A1c today was 7%, improving from last visit of 7.5%.  Analysis of his CGM shows TIR 91%, TAR 9%, TBR 0%.  He denies any singificant hypoglycemia hypoglycemia, notes he has had  it drop to mid 60s on several occasions.    -Recent labs reviewed.  - I had a long discussion with him about the progressive nature of diabetes and the pathology  behind its complications. -his diabetes is complicated by CKD, CAD and he remains at a high risk for more acute and chronic complications which include CAD, CVA, CKD, retinopathy, and neuropathy. These are all discussed in detail with him.  - Nutritional counseling repeated at each appointment due to patients tendency to fall back in to old habits.  - The patient admits there is a room for improvement in their diet and drink choices. -  Suggestion is made for the patient to avoid simple carbohydrates from their diet including Cakes, Sweet Desserts / Pastries, Ice Cream, Soda (diet and regular), Sweet Tea, Candies, Chips, Cookies, Sweet Pastries, Store Bought Juices, Alcohol in Excess of 1-2 drinks a day, Artificial Sweeteners, Coffee Creamer, and Sugar-free Products. This will help patient to have stable blood glucose profile and potentially avoid unintended weight gain.   - I encouraged the patient to switch to unprocessed or minimally processed complex starch and increased protein intake (animal or plant source), fruits, and vegetables.   - Patient is advised to stick to a routine mealtimes to eat 3 meals a day and avoid unnecessary snacks (to snack only to correct hypoglycemia).  - I have approached him with the following individualized plan to manage  his diabetes and patient agrees:   -Given his improved, at goal glycemic profile overall will lower his Tresiba  to 20 units SQ nightly and continue the higher dose of Ozempic 1 mg SW weekly (from PAP set up by his PCP) and lower Metformin  to 500 mg ER once daily (he has been taking 2 tabs twice daily by misunderstanding).   -he is encouraged to continue using his CGM to monitor glucose 4 times daily (using his CGM), before meals and before bed, and to call the clinic if he has readings less than 70 or greater than 300 for 3 tests in a row.    - he is warned not to take insulin  without proper monitoring per orders. - Adjustment parameters are  given to him for hypo and hyperglycemia in writing.  - Specific targets for  A1c;  LDL, HDL,  and Triglycerides were discussed with the patient.  2) Blood Pressure /Hypertension:  his blood pressure is controlled to target.   he is advised to continue his current medications as prescribed by his PCP.  3) Lipids/Hyperlipidemia:    Review of his recent lipid panel from 06/11/21 showed controlled LDL at 57 .  he  is advised to continue Crestor  20 mg daily at bedtime.  Side effects and precautions discussed with him.   4)  Weight/Diet:  his Body mass index is 36.18 kg/m.  -  clearly complicating his diabetes care.   he is a candidate for weight loss. I discussed with him the fact that loss of 5 - 10% of his  current body weight will have the most impact on his diabetes management.  Exercise, and detailed carbohydrates information provided  -  detailed on discharge instructions.  5) Chronic Care/Health Maintenance: -he is on ACEI/ARB and Statin medications and is encouraged to initiate and continue to follow up with Ophthalmology, Dentist, Podiatrist at least yearly or according to recommendations, and advised to stay away from smoking. I have recommended yearly flu vaccine and pneumonia vaccine at least every 5 years; moderate intensity exercise for up to  150 minutes weekly; and sleep for at least 7 hours a day.  - he is advised to maintain close follow up with Jolinda Norene HERO, DO for primary care needs, as well as his other providers for optimal and coordinated care.     I spent  37  minutes in the care of the patient today including review of labs from CMP, Lipids, Thyroid  Function, Hematology (current and previous including abstractions from other facilities); face-to-face time discussing  his blood glucose readings/logs, discussing hypoglycemia and hyperglycemia episodes and symptoms, medications doses, his options of short and long term treatment based on the latest standards of care /  guidelines;  discussion about incorporating lifestyle medicine;  and documenting the encounter. Risk reduction counseling performed per USPSTF guidelines to reduce obesity and cardiovascular risk factors.     Please refer to Patient Instructions for Blood Glucose Monitoring and Insulin /Medications Dosing Guide  in media tab for additional information. Please  also refer to  Patient Self Inventory in the Media  tab for reviewed elements of pertinent patient history.  [name] participated in the discussions, expressed understanding, and voiced agreement with the above plans.  All questions were answered to his satisfaction. he is encouraged to contact clinic should he have any questions or concerns prior to his return visit.   Follow up plan: - Return in about 4 months (around 06/25/2024) for Diabetes F/U with A1c in office, No previsit labs, Bring meter and logs.  Benton Rio, [facility] [address] [place], [place] [address] Phone: [phone] Fax: [phone]  02/23/2024, 3:42 PM

## 2024-03-01 ENCOUNTER — Other Ambulatory Visit: Payer: Self-pay | Admitting: Family Medicine

## 2024-03-01 DIAGNOSIS — I4819 Other persistent atrial fibrillation: Secondary | ICD-10-CM

## 2024-03-02 DIAGNOSIS — E1165 Type 2 diabetes mellitus with hyperglycemia: Secondary | ICD-10-CM | POA: Diagnosis not present

## 2024-03-19 ENCOUNTER — Encounter: Payer: Self-pay | Admitting: Family Medicine

## 2024-03-19 ENCOUNTER — Ambulatory Visit: Admitting: Family Medicine

## 2024-03-19 DIAGNOSIS — Z79899 Other long term (current) drug therapy: Secondary | ICD-10-CM | POA: Diagnosis not present

## 2024-03-19 DIAGNOSIS — I2583 Coronary atherosclerosis due to lipid rich plaque: Secondary | ICD-10-CM

## 2024-03-19 DIAGNOSIS — F1320 Sedative, hypnotic or anxiolytic dependence, uncomplicated: Secondary | ICD-10-CM

## 2024-03-19 DIAGNOSIS — I2510 Atherosclerotic heart disease of native coronary artery without angina pectoris: Secondary | ICD-10-CM

## 2024-03-19 DIAGNOSIS — F411 Generalized anxiety disorder: Secondary | ICD-10-CM

## 2024-03-19 MED ORDER — ROSUVASTATIN CALCIUM 20 MG PO TABS: ORAL_TABLET | ORAL | 3 refills | Status: AC

## 2024-03-19 MED ORDER — CITALOPRAM HYDROBROMIDE 20 MG PO TABS: 20.0000 mg | ORAL_TABLET | Freq: Every day | ORAL | 3 refills | Status: DC

## 2024-03-19 MED ORDER — LISINOPRIL 40 MG PO TABS: 40.0000 mg | ORAL_TABLET | Freq: Every day | ORAL | 3 refills | Status: AC

## 2024-03-19 MED ORDER — ALPRAZOLAM 0.5 MG PO TABS: 0.2500 mg | ORAL_TABLET | Freq: Every day | ORAL | 1 refills | Status: DC | PRN

## 2024-03-19 NOTE — Progress Notes (Signed)
 Subjective: CC: Anxiety disorder PCP: Jolinda Norene HERO, DO [name] is a 73 y.o. male presenting to clinic today for:  1.  Generalized anxiety disorder Patient is treated with both Celexa  and mirtazapine .  Previously on BuSpar  but it was ineffective.  He takes alprazolam  at bedtime daily.  This is a weaned from high dose 3 times daily dosing.  He denies any falls.  He has some gait instability but this is chronic due to MSK issues and he ambulates with use of cane.  Denies any changes in mentation, confusion or memory changes.  He reports that his blood sugar was well-controlled on his most recent visit with endocrinology.   ROS: Per HPI  Allergies  Allergen Reactions   Farxiga  [Dapagliflozin ] Other (See Comments)    Dizziness, passed out/orthostasis   Past Medical History:  Diagnosis Date   Anxiety    Arthritis    Diabetes mellitus without complication (HCC)    GERD (gastroesophageal reflux disease)    Hypercholesteremia    Hypertension     Current Outpatient Medications:    ALPRAZolam  (XANAX ) 0.5 MG tablet, Take 0.5-1 tablets (0.25-0.5 mg total) by mouth daily as needed (panic attacks)., Disp: 90 tablet, Rfl: 1   amLODipine  (NORVASC ) 5 MG tablet, Take 1 tablet (5 mg total) by mouth daily., Disp: 90 tablet, Rfl: 3   apixaban  (ELIQUIS ) 5 MG TABS tablet, TAKE 1 TABLET BY MOUTH TWICE A DAY, Disp: 180 tablet, Rfl: 0   citalopram  (CELEXA ) 20 MG tablet, TAKE 1 TABLET BY MOUTH EVERY DAY, Disp: 90 tablet, Rfl: 1   Continuous Glucose Receiver (FREESTYLE LIBRE 3 READER) DEVI, with compatible Freestyle Libre 3 sensor to monitor glucose continuously. DX: E11.65, Disp: 1 each, Rfl: 1   Continuous Glucose Sensor (FREESTYLE LIBRE 3 PLUS SENSOR) MISC, by Does not apply route. Change sensor every 15 days., Disp: , Rfl:    famotidine  (PEPCID ) 20 MG tablet, TAKE 1 TABLET BY MOUTH TWICE A DAY, Disp: 180 tablet, Rfl: 0   glucose blood (ONETOUCH VERIO) test strip, Use as instructed to  monitor glucose 4 times daily (use as back up method for CGM), Disp: 100 each, Rfl: 12   insulin  degludec (TRESIBA  FLEXTOUCH) 200 UNIT/ML FlexTouch Pen, Inject 20 Units into the skin daily., Disp: , Rfl:    Insulin  Pen Needle (B-D ULTRAFINE III SHORT PEN) 31G X 8 MM MISC, Use to give insulin  daily Dx E11.9, Disp: 100 each, Rfl: 3   Lancets (ONETOUCH ULTRASOFT) lancets, Use as instructed to monitor glucose 4 times daily (use as back up method for CGM)., Disp: 100 each, Rfl: 12   lisinopril  (ZESTRIL ) 40 MG tablet, Take 1 tablet (40 mg total) by mouth daily., Disp: 90 tablet, Rfl: 3   metFORMIN  (GLUCOPHAGE -XR) 500 MG 24 hr tablet, Take 1 tablet (500 mg total) by mouth daily with breakfast. TAKE 1 TABLET BY MOUTH TWICE A DAY WITH FOOD, Disp: , Rfl:    mirtazapine  (REMERON ) 45 MG tablet, Take 1 tablet (45 mg total) by mouth at bedtime., Disp: 90 tablet, Rfl: 3   omeprazole (PRILOSEC) 20 MG capsule, Take 20 mg by mouth daily., Disp: , Rfl:    Probiotic Product (PHILLIPS COLON HEALTH) CAPS, Take 1 capsule by mouth daily., Disp: 30 capsule, Rfl: 1   rosuvastatin  (CRESTOR ) 20 MG tablet, Take 1 tablet (20 mg) by mouth daily, Disp: 90 tablet, Rfl: 3   Semaglutide, 1 MG/DOSE, (OZEMPIC, 1 MG/DOSE,) 2 MG/1.5ML SOPN, Inject 1 mg into the skin once a  week. MED VIA [employer] PATIENT ASSISTANCE PROGRAM [name], [facility] assisting with this), Disp: , Rfl:    sildenafil  (VIAGRA ) 50 MG tablet, Take 1 tablet (50 mg total) by mouth daily as needed for erectile dysfunction., Disp: 10 tablet, Rfl: 2 Social History   Socioeconomic History   Marital status: Divorced    Spouse name: Not on file   Number of children: Not on file   Years of education: Not on file   Highest education level: Not on file  Occupational History   Not on file  Tobacco Use   Smoking status: Former    Current packs/day: 0.00    Average packs/day: 0.5 packs/day for 41.0 years (20.5 ttl pk-yrs)    Types: Cigarettes    Start date: 03/01/1971     Quit date: 02/29/2012    Years since quitting: 12.0   Smokeless tobacco: Never  Vaping Use   Vaping status: Never Used  Substance and Sexual Activity   Alcohol use: No    Comment: quit 2012   Drug use: No    Comment: quit in 2012   Sexual activity: Yes    Birth control/protection: None  Other Topics Concern   Not on file  Social History Narrative   Not on file   Social Drivers of Health   Financial Resource Strain: Low Risk  (09/09/2023)   Overall Financial Resource Strain (CARDIA)    Difficulty of Paying Living Expenses: Not hard at all  Food Insecurity: No Food Insecurity (09/09/2023)   Hunger Vital Sign    Worried About Running Out of Food in the Last Year: Never true    Ran Out of Food in the Last Year: Never true  Transportation Needs: No Transportation Needs (09/09/2023)   PRAPARE - Administrator, Civil Service (Medical): No    Lack of Transportation (Non-Medical): No  Physical Activity: Inactive (09/09/2023)   Exercise Vital Sign    Days of Exercise per Week: 0 days    Minutes of Exercise per Session: 0 min  Stress: No Stress Concern Present (09/09/2023)   [employer] of Occupational Health - Occupational Stress Questionnaire    Feeling of Stress : Not at all  Social Connections: Moderately Integrated (09/09/2023)   Social Connection and Isolation Panel    Frequency of Communication with Friends and Family: More than three times a week    Frequency of Social Gatherings with Friends and Family: Three times a week    Attends Religious Services: More than 4 times per year    Active Member of Clubs or Organizations: Yes    Attends Banker Meetings: More than 4 times per year    Marital Status: Divorced  Intimate Partner Violence: Not At Risk (09/09/2023)   Humiliation, Afraid, Rape, and Kick questionnaire    Fear of Current or Ex-Partner: No    Emotionally Abused: No    Physically Abused: No    Sexually Abused: No   Family History  Problem  Relation Age of Onset   Diabetes Mother    Glaucoma Mother    Colon cancer Neg Hx     Objective: Office vital signs reviewed. BP (!) 146/79   Pulse 71   Temp 97.8 F (36.6 C)   Ht 6' (1.829 m)   Wt 271 lb (122.9 kg)   SpO2 98%   BMI 36.75 kg/m   Physical Examination:  General: Awake, alert, well nourished, No acute distress HEENT: sclera white, MMM Cardio: regular rate and  rhythm, S1S2 heard, no murmurs appreciated Pulm: clear to auscultation bilaterally, no wheezes, rhonchi or rales; normal work of breathing on room air MSK: ambulating with use of cane     03/19/2024    1:20 PM 09/09/2023   12:59 PM 08/18/2023    2:38 PM  Depression screen PHQ 2/9  Decreased Interest 0 1 1  Down, Depressed, Hopeless 0 2 2  PHQ - 2 Score 0 3 3  Altered sleeping 0 1 1  Tired, decreased energy 1 0 0  Change in appetite 0 1 1  Feeling bad or failure about yourself  0 0 0  Trouble concentrating 0 0 0  Moving slowly or fidgety/restless 0 1 1  Suicidal thoughts 0 0 0  PHQ-9 Score 1 6 6   Difficult doing work/chores Not difficult at all  Somewhat difficult      03/19/2024    1:20 PM 08/18/2023    2:37 PM 04/15/2023    2:06 PM 01/13/2023    2:13 PM  GAD 7 : Generalized Anxiety Score  Nervous, Anxious, on Edge 0 0 0 0  Control/stop worrying 0 1 0 0  Worry too much - different things 0 0 0 0  Trouble relaxing 0 1 0 1  Restless 0 0 0 0  Easily annoyed or irritable 0 0 0 0  Afraid - awful might happen 0 0 0 0  Total GAD 7 Score 0 2 0 1  Anxiety Difficulty Not difficult at all  Not difficult at all Not difficult at all   Assessment/ Plan: 73 y.o. male   GAD (generalized anxiety disorder) - Plan: ALPRAZolam  (XANAX ) 0.5 MG tablet, citalopram  (CELEXA ) 20 MG tablet  Benzodiazepine dependence (HCC) - Plan: ALPRAZolam  (XANAX ) 0.5 MG tablet, citalopram  (CELEXA ) 20 MG tablet  Controlled substance agreement signed - Plan: ALPRAZolam  (XANAX ) 0.5 MG tablet  Coronary artery disease due to lipid  rich plaque - Plan: rosuvastatin  (CRESTOR ) 20 MG tablet  Up-to-date on UDS and CSC and the national narcotic database was reviewed with no red flags.  Postdated prescription for Xanax  placed.  May follow-up in 4 months for annual physical with fasting labs.  Meds have been renewed.   Norene CHRISTELLA Fielding, DO [facility] [phone]

## 2024-03-25 ENCOUNTER — Telehealth: Payer: Self-pay

## 2024-03-25 NOTE — Telephone Encounter (Signed)
 [facility] [facility] refill form for OZEMPIC  emailed to [facility] for completion.

## 2024-03-31 NOTE — Telephone Encounter (Signed)
 Faxed refills to novo nordisk:  [employer]  U200 Pen needles #100 Ozempic 1mg  dose pens

## 2024-04-06 DIAGNOSIS — M79674 Pain in right toe(s): Secondary | ICD-10-CM | POA: Diagnosis not present

## 2024-04-06 DIAGNOSIS — M79675 Pain in left toe(s): Secondary | ICD-10-CM | POA: Diagnosis not present

## 2024-04-06 DIAGNOSIS — L84 Corns and callosities: Secondary | ICD-10-CM | POA: Diagnosis not present

## 2024-04-06 DIAGNOSIS — E1142 Type 2 diabetes mellitus with diabetic polyneuropathy: Secondary | ICD-10-CM | POA: Diagnosis not present

## 2024-04-06 DIAGNOSIS — B351 Tinea unguium: Secondary | ICD-10-CM | POA: Diagnosis not present

## 2024-04-16 ENCOUNTER — Other Ambulatory Visit: Payer: Self-pay | Admitting: Family Medicine

## 2024-04-16 DIAGNOSIS — R12 Heartburn: Secondary | ICD-10-CM

## 2024-05-04 ENCOUNTER — Telehealth: Payer: Self-pay | Admitting: Pharmacist

## 2024-05-04 NOTE — Telephone Encounter (Signed)
 Your [facility]  via [employer] patient assistance program is here at your doctor's office for pick up. Please pick up within 5 business days if you are able due to our limited storage space.  Patient's mailbox is full and cannot accept messages Will try again

## 2024-05-09 ENCOUNTER — Other Ambulatory Visit: Payer: Self-pay | Admitting: Family Medicine

## 2024-05-09 DIAGNOSIS — E119 Type 2 diabetes mellitus without complications: Secondary | ICD-10-CM

## 2024-05-19 ENCOUNTER — Encounter: Payer: Self-pay | Admitting: Gastroenterology

## 2024-05-26 NOTE — Progress Notes (Signed)
 [name]                                          MRN: [id number]   05/26/2024   The [facility] Quality Team Specialist reviewed this patient medical record for the purposes of chart review for care gap closure. The following were reviewed: abstraction for care gap closure-glycemic status assessment.    [facility]BCI Quality Team

## 2024-05-26 NOTE — Progress Notes (Signed)
 [name]                                          MRN: [id number]   05/26/2024   The [facility] Quality Team Specialist reviewed this patient medical record for the purposes of chart review for care gap closure. The following were reviewed: chart review for care gap closure-kidney health evaluation for diabetes:eGFR  and uACR.    [facility]BCI Quality Team

## 2024-05-30 ENCOUNTER — Other Ambulatory Visit: Payer: Self-pay | Admitting: Family Medicine

## 2024-05-30 DIAGNOSIS — I4819 Other persistent atrial fibrillation: Secondary | ICD-10-CM

## 2024-06-01 NOTE — Progress Notes (Signed)
 [name]                                          MRN: [id number]   06/01/2024   The [facility] Quality Team Specialist reviewed this patient medical record for the purposes of chart review for care gap closure. The following were reviewed: chart review for care gap closure-glycemic status assessment and kidney health evaluation for diabetes:eGFR  and uACR.    [facility]BCI Quality Team

## 2024-06-02 DIAGNOSIS — E1165 Type 2 diabetes mellitus with hyperglycemia: Secondary | ICD-10-CM | POA: Diagnosis not present

## 2024-06-24 ENCOUNTER — Telehealth: Payer: Self-pay

## 2024-06-29 ENCOUNTER — Ambulatory Visit: Admitting: Nurse Practitioner

## 2024-07-15 ENCOUNTER — Telehealth: Payer: Self-pay

## 2024-07-15 DIAGNOSIS — E119 Type 2 diabetes mellitus without complications: Secondary | ICD-10-CM

## 2024-07-15 NOTE — Progress Notes (Signed)
 Pharmacy Quality Measure Review  This patient is appearing on a report for being at risk of failing the adherence measure for diabetes medications this calendar year.   Medication: Metformin  500mg  ER Last fill date: 07/09 for 90 day supply  Spoke with patient, states he is adherent and takes one tablet once daily in the morning. Fill history indicates last prescription he filled was 2 tablets BID, med list in chart indicates 1 tablet BID.  Will message PCP to discuss this at their appointment tomorrow. Recommend updated prescription is sent with most up to date SIG to avoid confusion. All questions by the patient were answered.  Tarvis Blossom, PharmD [facility] Pharmacist

## 2024-07-16 ENCOUNTER — Ambulatory Visit: Payer: Self-pay | Admitting: Family Medicine

## 2024-07-16 ENCOUNTER — Encounter: Payer: Self-pay | Admitting: Family Medicine

## 2024-07-16 VITALS — BP 114/64 | HR 60 | Temp 97.6°F | Ht 72.0 in | Wt 277.0 lb

## 2024-07-16 DIAGNOSIS — Z79899 Other long term (current) drug therapy: Secondary | ICD-10-CM | POA: Diagnosis not present

## 2024-07-16 DIAGNOSIS — F1320 Sedative, hypnotic or anxiolytic dependence, uncomplicated: Secondary | ICD-10-CM

## 2024-07-16 DIAGNOSIS — E1159 Type 2 diabetes mellitus with other circulatory complications: Secondary | ICD-10-CM

## 2024-07-16 DIAGNOSIS — H8112 Benign paroxysmal vertigo, left ear: Secondary | ICD-10-CM | POA: Diagnosis not present

## 2024-07-16 DIAGNOSIS — E11621 Type 2 diabetes mellitus with foot ulcer: Secondary | ICD-10-CM | POA: Diagnosis not present

## 2024-07-16 DIAGNOSIS — E119 Type 2 diabetes mellitus without complications: Secondary | ICD-10-CM

## 2024-07-16 DIAGNOSIS — E1169 Type 2 diabetes mellitus with other specified complication: Secondary | ICD-10-CM

## 2024-07-16 DIAGNOSIS — L97412 Non-pressure chronic ulcer of right heel and midfoot with fat layer exposed: Secondary | ICD-10-CM | POA: Diagnosis not present

## 2024-07-16 DIAGNOSIS — E785 Hyperlipidemia, unspecified: Secondary | ICD-10-CM | POA: Diagnosis not present

## 2024-07-16 DIAGNOSIS — F411 Generalized anxiety disorder: Secondary | ICD-10-CM

## 2024-07-16 DIAGNOSIS — I152 Hypertension secondary to endocrine disorders: Secondary | ICD-10-CM | POA: Diagnosis not present

## 2024-07-16 DIAGNOSIS — Z794 Long term (current) use of insulin: Secondary | ICD-10-CM | POA: Diagnosis not present

## 2024-07-16 LAB — BAYER DCA HB A1C WAIVED: HB A1C (BAYER DCA - WAIVED): 7.8 % — ABNORMAL HIGH (ref 4.8–5.6)

## 2024-07-16 MED ORDER — METFORMIN HCL ER 500 MG PO TB24: 500.0000 mg | ORAL_TABLET | Freq: Two times a day (BID) | ORAL | 3 refills | Status: AC

## 2024-07-16 MED ORDER — MIRTAZAPINE 45 MG PO TABS: 45.0000 mg | ORAL_TABLET | Freq: Every day | ORAL | 3 refills | Status: AC

## 2024-07-16 MED ORDER — ALPRAZOLAM 0.5 MG PO TABS: 0.2500 mg | ORAL_TABLET | Freq: Every day | ORAL | 0 refills | Status: AC | PRN

## 2024-07-16 NOTE — Patient Instructions (Signed)
 Benign Positional Vertigo Vertigo is the feeling that you or your surroundings are moving when they are not. Benign positional vertigo is the most common form of vertigo. This is usually a harmless condition (benign). This condition is positional. This means that symptoms are triggered by certain movements and positions. This condition can be dangerous if it occurs while you are doing something that could cause harm to yourself or others. This includes activities such as driving or operating machinery. What are the causes? The inner ear has fluid-filled canals that help your brain sense movement and balance. When the fluid moves, the brain receives messages about your body's position. With benign positional vertigo, calcium crystals in the inner ear break free and disturb the inner ear area. This causes your brain to receive confusing messages about your body's position. What increases the risk? You are more likely to develop this condition if: You are a woman. You are 73 years of age or older. You have recently had a head injury. You have an inner ear disease. What are the signs or symptoms? Symptoms of this condition usually happen when you move your head or your eyes in different directions. Symptoms may start suddenly and usually last for less than a minute. They include: Loss of balance and falling. Feeling like you are spinning or moving. Feeling like your surroundings are spinning or moving. Nausea and vomiting. Blurred vision. Dizziness. Involuntary eye movement (nystagmus). Symptoms can be mild and cause only minor problems, or they can be severe and interfere with daily life. Episodes of benign positional vertigo may return (recur) over time. Symptoms may also improve over time. How is this diagnosed? This condition may be diagnosed based on: Your medical history. A physical exam of the head, neck, and ears. Positional tests to check for or stimulate vertigo. You may be asked to  turn your head and change positions, such as going from sitting to lying down. A health care provider will watch for symptoms of vertigo. You may be referred to a health care provider who specializes in ear, nose, and throat problems (ENT or otolaryngologist) or a provider who specializes in disorders of the nervous system (neurologist). How is this treated?  This condition may be treated in a session in which your health care provider moves your head in specific positions to help the displaced crystals in your inner ear move. Treatment for this condition may take several sessions. Surgery may be needed in severe cases, but this is rare. In some cases, benign positional vertigo may resolve on its own in 2-4 weeks. Follow these instructions at home: Safety Move slowly. Avoid sudden body or head movements or certain positions, as told by your health care provider. Avoid driving or operating machinery until your health care provider says it is safe. Avoid doing any tasks that would be dangerous to you or others if vertigo occurs. If you have trouble walking or keeping your balance, try using a cane for stability. If you feel dizzy or unstable, sit down right away. Return to your normal activities as told by your health care provider. Ask your health care provider what activities are safe for you. General instructions Take over-the-counter and prescription medicines only as told by your health care provider. Drink enough fluid to keep your urine pale yellow. Keep all follow-up visits. This is important. Contact a health care provider if: You have a fever. Your condition gets worse or you develop new symptoms. Your family or friends notice any behavioral changes.  You have nausea or vomiting that gets worse. You have numbness or a prickling and tingling sensation. Get help right away if you: Have difficulty speaking or moving. Are always dizzy or faint. Develop severe headaches. Have weakness in  your legs or arms. Have changes in your hearing or vision. Develop a stiff neck. Develop sensitivity to light. These symptoms may represent a serious problem that is an emergency. Do not wait to see if the symptoms will go away. Get medical help right away. Call your local emergency services (911 in the [place] Do not drive yourself to the hospital. Summary Vertigo is the feeling that you or your surroundings are moving when they are not. Benign positional vertigo is the most common form of vertigo. This condition is caused by calcium crystals in the inner ear that become displaced. This causes a disturbance in an area of the inner ear that helps your brain sense movement and balance. Symptoms include loss of balance and falling, feeling that you or your surroundings are moving, nausea and vomiting, and blurred vision. This condition can be diagnosed based on symptoms, a physical exam, and positional tests. Follow safety instructions as told by your health care provider and keep all follow-up visits. This is important. This information is not intended to replace advice given to you by your health care provider. Make sure you discuss any questions you have with your health care provider. Document Revised: 02/10/2023 Document Reviewed: 02/10/2023 Elsevier Patient Education  2024 [employer].

## 2024-07-16 NOTE — Progress Notes (Signed)
 Subjective: CC:DM, GAD PCP: Jolinda Norene HERO, DO [name] is a 73 y.o. male presenting to clinic today for:  Type 2 Diabetes with hypertension, hyperlipidemia:  Glucometer: Freestyle libre 3.  Denies any hypoglycemic episodes.  Continues to work with clinical pharmacy.  Compliant with Tresiba  20 units, 1 mg of Ozempic and has apparently had variable compliance with the metformin  according to clinical notes.  He is compliant with his antihypertensives, cholesterol medication.  He sees his foot doctor in March.  Typically seeing him every 6 months  ROS: No chest pain, shortness of breath.  He does report neuropathy.  He reports some dizziness when he turns his head to the left when he is lying down but it usually resolves on its own.  No falls reported.  Continues to ambulate with cane  Generalized anxiety disorder He is not sure if he is taking his mirtazapine  or Celexa .  He continues to use his alprazolam  on a daily basis.  Reports some intermittent vertigo as above.  No falls.   Diabetes Health Maintenance Due  Topic Date Due   FOOT EXAM  04/14/2024   HEMOGLOBIN A1C  08/25/2024   OPHTHALMOLOGY EXAM  10/03/2024    ROS: Per HPI  Allergies[1] Past Medical History:  Diagnosis Date   Anxiety    Arthritis    Diabetes mellitus without complication (HCC)    GERD (gastroesophageal reflux disease)    Hypercholesteremia    Hypertension    Current Medications[2] Social History   Socioeconomic History   Marital status: Divorced    Spouse name: Not on file   Number of children: Not on file   Years of education: Not on file   Highest education level: Not on file  Occupational History   Not on file  Tobacco Use   Smoking status: Former    Current packs/day: 0.00    Average packs/day: 0.5 packs/day for 41.0 years (20.5 ttl pk-yrs)    Types: Cigarettes    Start date: 03/01/1971    Quit date: 02/29/2012    Years since quitting: 12.3   Smokeless tobacco: Never  Vaping  Use   Vaping status: Never Used  Substance and Sexual Activity   Alcohol use: No    Comment: quit 2012   Drug use: No    Comment: quit in 2012   Sexual activity: Yes    Birth control/protection: None  Other Topics Concern   Not on file  Social History Narrative   Not on file   Social Drivers of Health   Tobacco Use: Medium Risk (07/16/2024)   Patient History    Smoking Tobacco Use: Former    Smokeless Tobacco Use: Never    Passive Exposure: Not on Actuary Strain: Low Risk (09/09/2023)   Overall Financial Resource Strain (CARDIA)    Difficulty of Paying Living Expenses: Not hard at all  Food Insecurity: No Food Insecurity (09/09/2023)   Hunger Vital Sign    Worried About Running Out of Food in the Last Year: Never true    Ran Out of Food in the Last Year: Never true  Transportation Needs: No Transportation Needs (09/09/2023)   PRAPARE - Administrator, Civil Service (Medical): No    Lack of Transportation (Non-Medical): No  Physical Activity: Inactive (09/09/2023)   Exercise Vital Sign    Days of Exercise per Week: 0 days    Minutes of Exercise per Session: 0 min  Stress: No Stress Concern Present (09/09/2023)  [employer] of Occupational Health - Occupational Stress Questionnaire    Feeling of Stress : Not at all  Social Connections: Moderately Integrated (09/09/2023)   Social Connection and Isolation Panel    Frequency of Communication with Friends and Family: More than three times a week    Frequency of Social Gatherings with Friends and Family: Three times a week    Attends Religious Services: More than 4 times per year    Active Member of Clubs or Organizations: Yes    Attends Banker Meetings: More than 4 times per year    Marital Status: Divorced  Intimate Partner Violence: Not At Risk (09/09/2023)   Humiliation, Afraid, Rape, and Kick questionnaire    Fear of Current or Ex-Partner: No    Emotionally Abused: No    Physically  Abused: No    Sexually Abused: No  Depression (PHQ2-9): Low Risk (07/16/2024)   Depression (PHQ2-9)    PHQ-2 Score: 0  Alcohol Screen: Low Risk (09/09/2023)   Alcohol Screen    Last Alcohol Screening Score (AUDIT): 0  Housing: Unknown (09/09/2023)   Housing Stability Vital Sign    Unable to Pay for Housing in the Last Year: No    Number of Times Moved in the Last Year: Not on file    Homeless in the Last Year: No  Utilities: Not At Risk (09/09/2023)   AHC Utilities    Threatened with loss of utilities: No  Health Literacy: Adequate Health Literacy (09/09/2023)   B1300 Health Literacy    Frequency of need for help with medical instructions: Never   Family History  Problem Relation Age of Onset   Diabetes Mother    Glaucoma Mother    Colon cancer Neg Hx     Objective: Office vital signs reviewed. BP 114/64   Pulse 60   Temp 97.6 F (36.4 C)   Ht 6' (1.829 m)   Wt 277 lb (125.6 kg)   SpO2 99%   BMI 37.57 kg/m   Physical Examination:  General: Awake, alert, well nourished, No acute distress HEENT: sclera white, MMM.  Normal light reflex of the TMs. Cardio: regular rate and rhythm, S1S2 heard, no murmurs appreciated Pulm: clear to auscultation bilaterally, no wheezes, rhonchi or rales; normal work of breathing on room air MSK: Ambulates with use of cane Neuro: No nystagmus appreciated Diabetic Foot Exam - Simple   Simple Foot Form Diabetic Foot exam was performed with the following findings: Yes 07/16/2024 12:38 PM  Visual Inspection See comments: Yes Sensation Testing See comments: Yes Pulse Check See comments: Yes Comments +1 pedal pulses bilaterally.  He has onychomycotic changes to the nails bilaterally.  There is an ulcer along the first plantar MTP on the right.  No drainage, erythema or warmth.  Appears to be an ulcerative callus.     Lab Results  Component Value Date   HGBA1C 7.0 (A) 02/23/2024      07/16/2024   11:42 AM 03/19/2024    1:20 PM 09/09/2023    12:59 PM  Depression screen PHQ 2/9  Decreased Interest 0 0 1  Down, Depressed, Hopeless 0 0 2  PHQ - 2 Score 0 0 3  Altered sleeping 0 0 1  Tired, decreased energy 0 1 0  Change in appetite 0 0 1  Feeling bad or failure about yourself  0 0 0  Trouble concentrating 0 0 0  Moving slowly or fidgety/restless 0 0 1  Suicidal thoughts 0 0 0  PHQ-9  Score 0 1  6   Difficult doing work/chores Not difficult at all Not difficult at all      Data saved with a previous flowsheet row definition      07/16/2024   11:42 AM 03/19/2024    1:20 PM 08/18/2023    2:37 PM 04/15/2023    2:06 PM  GAD 7 : Generalized Anxiety Score  Nervous, Anxious, on Edge 0 0 0 0  Control/stop worrying 0 0 1 0  Worry too much - different things 0 0 0 0  Trouble relaxing 0 0 1 0  Restless 0 0 0 0  Easily annoyed or irritable 0 0 0 0  Afraid - awful might happen 0 0 0 0  Total GAD 7 Score 0 0 2 0  Anxiety Difficulty Not difficult at all Not difficult at all  Not difficult at all    Assessment/ Plan: 73 y.o. male   GAD (generalized anxiety disorder) - Plan: ALPRAZolam  (XANAX ) 0.5 MG tablet, mirtazapine  (REMERON ) 45 MG tablet  Benzodiazepine dependence (HCC) - Plan: ALPRAZolam  (XANAX ) 0.5 MG tablet, mirtazapine  (REMERON ) 45 MG tablet  Controlled substance agreement signed - Plan: ALPRAZolam  (XANAX ) 0.5 MG tablet  Type 2 diabetes mellitus with other specified complication, with long-term current use of insulin  (HCC) - Plan: Microalbumin / creatinine urine ratio, Bayer DCA Hb A1c Waived, Basic Metabolic Panel  Hypertension associated with diabetes (HCC) - Plan: Basic Metabolic Panel  Hyperlipidemia associated with type 2 diabetes mellitus (HCC)  Diabetic ulcer of right midfoot associated with type 2 diabetes mellitus, with fat layer exposed (HCC)  BPPV (benign paroxysmal positional vertigo), left  Anxiety is chronic and stable.  Uncertain compliance with mirtazapine .  I have reordered that.  Okay to  discontinue Celexa  if he has not been taking it as his GAD and PHQ are stable but I would like him to have some type of controller on board.  Could consider reducing dose if not needing it but he seemed enthusiastic about that medication for a sleep standpoint.  Alprazolam  renewed for ongoing use.  This is the lowest tolerated dose in this patient.  No red flag signs or symptoms currently  Check urine microalbumin, A1c (which was not at goal today at 7.8).  Apparently there is been variable compliance with the metformin .  Reinforced need for compliance with metformin  twice daily as prescribed in addition to the GLP and insulin .  He will get his foot looked at by Dr. Roddie ASAP as this is consistent with an ulcer.  No evidence of secondary infection at this time  Blood pressure controlled.  Not due for fasting lipid until physical which is scheduled in April  Discussed that BPPV is likely the cause of his symptoms.  He was asymptomatic today and declined referral to physical therapy   Akif Weldy M Aloma Boch, DO [facility] [phone]      [1]  Allergies Allergen Reactions   Farxiga  [Dapagliflozin ] Other (See Comments)    Dizziness, passed out/orthostasis  [2]  Current Outpatient Medications:    amLODipine  (NORVASC ) 5 MG tablet, Take 1 tablet (5 mg total) by mouth daily., Disp: 90 tablet, Rfl: 3   Continuous Glucose Receiver (FREESTYLE LIBRE 3 READER) DEVI, with compatible Freestyle Libre 3 sensor to monitor glucose continuously. DX: E11.65, Disp: 1 each, Rfl: 1   Continuous Glucose Sensor (FREESTYLE LIBRE 3 PLUS SENSOR) MISC, by Does not apply route. Change sensor every 15 days., Disp: , Rfl:    ELIQUIS  5 MG  TABS tablet, TAKE 1 TABLET BY MOUTH TWICE A DAY, Disp: 180 tablet, Rfl: 0   famotidine  (PEPCID ) 20 MG tablet, TAKE 1 TABLET BY MOUTH TWICE A DAY, Disp: 180 tablet, Rfl: 0   glucose blood (ONETOUCH VERIO) test strip, Use as instructed to monitor glucose 4 times  daily (use as back up method for CGM), Disp: 100 each, Rfl: 12   insulin  degludec (TRESIBA  FLEXTOUCH) 200 UNIT/ML FlexTouch Pen, Inject 20 Units into the skin daily., Disp: , Rfl:    Insulin  Pen Needle (B-D ULTRAFINE III SHORT PEN) 31G X 8 MM MISC, Use to give insulin  daily Dx E11.9, Disp: 100 each, Rfl: 3   Lancets (ONETOUCH ULTRASOFT) lancets, Use as instructed to monitor glucose 4 times daily (use as back up method for CGM)., Disp: 100 each, Rfl: 12   lisinopril  (ZESTRIL ) 40 MG tablet, Take 1 tablet (40 mg total) by mouth daily., Disp: 90 tablet, Rfl: 3   metFORMIN  (GLUCOPHAGE -XR) 500 MG 24 hr tablet, Take 1 tablet (500 mg total) by mouth daily with breakfast. TAKE 1 TABLET BY MOUTH TWICE A DAY WITH FOOD, Disp: , Rfl:    omeprazole (PRILOSEC) 20 MG capsule, Take 20 mg by mouth daily., Disp: , Rfl:    rosuvastatin  (CRESTOR ) 20 MG tablet, Take 1 tablet (20 mg) by mouth daily, Disp: 90 tablet, Rfl: 3   Semaglutide, 1 MG/DOSE, (OZEMPIC, 1 MG/DOSE,) 2 MG/1.5ML SOPN, Inject 1 mg into the skin once a week. MED VIA NOVO [employer]ORD[employer] PATIENT ASSISTANCE PROGRAM Ilah [place], [facility] assisting with this), Disp: , Rfl:    sildenafil  (VIAGRA ) 50 MG tablet, Take 1 tablet (50 mg total) by mouth daily as needed for erectile dysfunction., Disp: 10 tablet, Rfl: 2   [START ON 08/11/2024] ALPRAZolam  (XANAX ) 0.5 MG tablet, Take 0.5-1 tablets (0.25-0.5 mg total) by mouth daily as needed (panic attacks)., Disp: 90 tablet, Rfl: 0   mirtazapine  (REMERON ) 45 MG tablet, Take 1 tablet (45 mg total) by mouth at bedtime., Disp: 90 tablet, Rfl: 3   Probiotic Product (PHILLIPS COLON HEALTH) CAPS, Take 1 capsule by mouth daily. (Patient not taking: Reported on 07/16/2024), Disp: 30 capsule, Rfl: 1

## 2024-07-17 LAB — BASIC METABOLIC PANEL WITH GFR
BUN/Creatinine Ratio: 14 (ref 10–24)
BUN: 13 mg/dL (ref 8–27)
CO2: 24 mmol/L (ref 20–29)
Calcium: 9.9 mg/dL (ref 8.6–10.2)
Chloride: 100 mmol/L (ref 96–106)
Creatinine, Ser: 0.96 mg/dL (ref 0.76–1.27)
Glucose: 117 mg/dL — ABNORMAL HIGH (ref 70–99)
Potassium: 4.6 mmol/L (ref 3.5–5.2)
Sodium: 139 mmol/L (ref 134–144)
eGFR: 83 mL/min/1.73 (ref >59)

## 2024-07-17 LAB — MICROALBUMIN / CREATININE URINE RATIO
Creatinine, Urine: 73.8 mg/dL
Microalb/Creat Ratio: 54 mg/g{creat} — ABNORMAL HIGH (ref 0–29)
Microalbumin, Urine: 39.9 ug/mL

## 2024-07-19 ENCOUNTER — Telehealth: Payer: Self-pay | Admitting: Family Medicine

## 2024-07-19 ENCOUNTER — Other Ambulatory Visit: Payer: Self-pay | Admitting: Family Medicine

## 2024-07-19 DIAGNOSIS — R12 Heartburn: Secondary | ICD-10-CM

## 2024-07-19 NOTE — Telephone Encounter (Signed)
 Copied from CRM #[id number]. Topic: Clinical - Lab/Test Results >> Jul 19, 2024  4:15 PM Graeme ORN wrote: Reason for CRM: Patient returned missed call for labs. Read note as written by provider. Patient understood. No Further questions at this time. Thank You.

## 2024-07-20 ENCOUNTER — Telehealth: Payer: Self-pay

## 2024-07-20 NOTE — Telephone Encounter (Signed)
 Patient enrolled with [employer] patient assistance until 08/04/24 for Ozempic and Tresiba  U200.   The following changes are in order for 2026:   Medicare patients will no longer be eligible for Ozempic. Medicare patients on insulin  whose household income is below 150% of the federal poverty level will need to apply for Low Income Subsidy/Extra Help with Social Security. If denied, we will be able to use that letter to attempt enrollment/re-enrollment with the company for insulin .  Income limits for Low Income Subsidy/Extra Help with Social Security:   Individual: Annual income under [phone] (about $1950/month). Married couple: Annual income under [phone] (about $2600/month).

## 2024-07-28 ENCOUNTER — Ambulatory Visit: Admitting: Gastroenterology

## 2024-09-02 ENCOUNTER — Other Ambulatory Visit: Payer: Self-pay | Admitting: Family Medicine

## 2024-09-02 DIAGNOSIS — I4819 Other persistent atrial fibrillation: Secondary | ICD-10-CM

## 2024-09-09 ENCOUNTER — Ambulatory Visit: Payer: Medicare Other

## 2024-09-10 ENCOUNTER — Other Ambulatory Visit: Payer: Self-pay | Admitting: Family Medicine

## 2024-09-21 ENCOUNTER — Ambulatory Visit: Admitting: Gastroenterology

## 2024-10-06 ENCOUNTER — Ambulatory Visit: Admitting: Nurse Practitioner

## 2024-11-17 ENCOUNTER — Encounter: Payer: Self-pay | Admitting: Family Medicine
